# Patient Record
Sex: Female | Born: 1955 | Race: White | Hispanic: No | Marital: Single | Smoking: Former smoker
Health system: Southern US, Community
[De-identification: ages and names within clinical notes are randomized; demographics above are authoritative.]

## PROBLEM LIST (undated history)

## (undated) DIAGNOSIS — G4733 Obstructive sleep apnea (adult) (pediatric): Secondary | ICD-10-CM

## (undated) DIAGNOSIS — F329 Major depressive disorder, single episode, unspecified: Secondary | ICD-10-CM

## (undated) DIAGNOSIS — K635 Polyp of colon: Secondary | ICD-10-CM

## (undated) DIAGNOSIS — I495 Sick sinus syndrome: Secondary | ICD-10-CM

## (undated) DIAGNOSIS — Z45018 Encounter for adjustment and management of other part of cardiac pacemaker: Secondary | ICD-10-CM

## (undated) DIAGNOSIS — F419 Anxiety disorder, unspecified: Secondary | ICD-10-CM

## (undated) DIAGNOSIS — F32A Depression, unspecified: Secondary | ICD-10-CM

## (undated) DIAGNOSIS — K602 Anal fissure, unspecified: Secondary | ICD-10-CM

## (undated) DIAGNOSIS — I10 Essential (primary) hypertension: Secondary | ICD-10-CM

## (undated) DIAGNOSIS — T7840XA Allergy, unspecified, initial encounter: Secondary | ICD-10-CM

## (undated) DIAGNOSIS — K760 Fatty (change of) liver, not elsewhere classified: Secondary | ICD-10-CM

## (undated) DIAGNOSIS — E669 Obesity, unspecified: Secondary | ICD-10-CM

## (undated) DIAGNOSIS — E785 Hyperlipidemia, unspecified: Secondary | ICD-10-CM

## (undated) DIAGNOSIS — K5790 Diverticulosis of intestine, part unspecified, without perforation or abscess without bleeding: Secondary | ICD-10-CM

## (undated) DIAGNOSIS — M1990 Unspecified osteoarthritis, unspecified site: Secondary | ICD-10-CM

## (undated) DIAGNOSIS — H269 Unspecified cataract: Secondary | ICD-10-CM

## (undated) DIAGNOSIS — K219 Gastro-esophageal reflux disease without esophagitis: Secondary | ICD-10-CM

## (undated) HISTORY — PX: PACEMAKER PLACEMENT: SHX43

## (undated) HISTORY — DX: Allergy, unspecified, initial encounter: T78.40XA

## (undated) HISTORY — DX: Obesity, unspecified: E66.9

## (undated) HISTORY — DX: Diverticulosis of intestine, part unspecified, without perforation or abscess without bleeding: K57.90

## (undated) HISTORY — DX: Depression, unspecified: F32.A

## (undated) HISTORY — DX: Unspecified cataract: H26.9

## (undated) HISTORY — DX: Fatty (change of) liver, not elsewhere classified: K76.0

## (undated) HISTORY — DX: Obstructive sleep apnea (adult) (pediatric): G47.33

## (undated) HISTORY — DX: Anxiety disorder, unspecified: F41.9

## (undated) HISTORY — DX: Hyperlipidemia, unspecified: E78.5

## (undated) HISTORY — DX: Unspecified osteoarthritis, unspecified site: M19.90

## (undated) HISTORY — DX: Sick sinus syndrome: I49.5

## (undated) HISTORY — PX: MULTIPLE TOOTH EXTRACTIONS: SHX2053

## (undated) HISTORY — DX: Major depressive disorder, single episode, unspecified: F32.9

## (undated) HISTORY — PX: OOPHORECTOMY: SHX86

## (undated) HISTORY — PX: CATARACT EXTRACTION: SUR2

## (undated) HISTORY — DX: Anal fissure, unspecified: K60.2

## (undated) HISTORY — PX: OTHER SURGICAL HISTORY: SHX169

## (undated) HISTORY — DX: Encounter for adjustment and management of other part of cardiac pacemaker: Z45.018

## (undated) HISTORY — DX: Polyp of colon: K63.5

## (undated) HISTORY — DX: Gastro-esophageal reflux disease without esophagitis: K21.9

## (undated) HISTORY — PX: TONSILLECTOMY: SUR1361

## (undated) HISTORY — DX: Essential (primary) hypertension: I10

---

## 1998-10-11 HISTORY — PX: ABDOMINAL HYSTERECTOMY: SHX81

## 2008-04-02 ENCOUNTER — Ambulatory Visit: Payer: Self-pay

## 2008-04-02 ENCOUNTER — Ambulatory Visit: Payer: Self-pay | Admitting: Internal Medicine

## 2008-04-04 ENCOUNTER — Ambulatory Visit: Payer: Self-pay

## 2008-04-04 ENCOUNTER — Encounter: Payer: Self-pay | Admitting: Internal Medicine

## 2008-05-07 ENCOUNTER — Ambulatory Visit: Payer: Self-pay | Admitting: Internal Medicine

## 2008-06-19 ENCOUNTER — Ambulatory Visit: Payer: Self-pay | Admitting: Internal Medicine

## 2008-08-30 ENCOUNTER — Ambulatory Visit: Payer: Self-pay | Admitting: Internal Medicine

## 2008-09-04 ENCOUNTER — Ambulatory Visit: Payer: Self-pay | Admitting: Internal Medicine

## 2008-09-04 LAB — CONVERTED CEMR LAB
Basophils Absolute: 0.2 10*3/uL — ABNORMAL HIGH (ref 0.0–0.1)
Basophils Relative: 4 % — ABNORMAL HIGH (ref 0.0–3.0)
Calcium: 9.7 mg/dL (ref 8.4–10.5)
Chloride: 106 meq/L (ref 96–112)
Creatinine, Ser: 0.5 mg/dL (ref 0.4–1.2)
Eosinophils Absolute: 0.1 10*3/uL (ref 0.0–0.7)
GFR calc non Af Amer: 137 mL/min
MCHC: 34.8 g/dL (ref 30.0–36.0)
MCV: 87.7 fL (ref 78.0–100.0)
Neutro Abs: 2.6 10*3/uL (ref 1.4–7.7)
Neutrophils Relative %: 53.7 % (ref 43.0–77.0)
RBC: 4.44 M/uL (ref 3.87–5.11)
RDW: 11.8 % (ref 11.5–14.6)
Sodium: 141 meq/L (ref 135–145)
aPTT: 27.1 s (ref 21.7–29.8)

## 2008-09-10 ENCOUNTER — Ambulatory Visit: Payer: Self-pay | Admitting: Internal Medicine

## 2008-09-10 ENCOUNTER — Ambulatory Visit (HOSPITAL_COMMUNITY): Admission: AD | Admit: 2008-09-10 | Discharge: 2008-09-11 | Payer: Self-pay | Admitting: Internal Medicine

## 2008-09-26 ENCOUNTER — Ambulatory Visit: Payer: Self-pay

## 2008-10-11 DIAGNOSIS — H269 Unspecified cataract: Secondary | ICD-10-CM

## 2008-10-11 HISTORY — DX: Unspecified cataract: H26.9

## 2008-10-11 HISTORY — PX: EYE SURGERY: SHX253

## 2008-10-25 ENCOUNTER — Emergency Department (HOSPITAL_COMMUNITY): Admission: EM | Admit: 2008-10-25 | Discharge: 2008-10-26 | Payer: Self-pay | Admitting: Emergency Medicine

## 2008-11-25 ENCOUNTER — Ambulatory Visit: Payer: Self-pay | Admitting: Internal Medicine

## 2009-04-29 ENCOUNTER — Encounter: Payer: Self-pay | Admitting: Internal Medicine

## 2009-09-16 ENCOUNTER — Ambulatory Visit: Payer: Self-pay | Admitting: Internal Medicine

## 2009-09-16 DIAGNOSIS — I5032 Chronic diastolic (congestive) heart failure: Secondary | ICD-10-CM | POA: Insufficient documentation

## 2009-09-16 DIAGNOSIS — I10 Essential (primary) hypertension: Secondary | ICD-10-CM | POA: Insufficient documentation

## 2009-09-16 DIAGNOSIS — R0602 Shortness of breath: Secondary | ICD-10-CM | POA: Insufficient documentation

## 2009-12-17 ENCOUNTER — Ambulatory Visit: Payer: Self-pay | Admitting: Internal Medicine

## 2009-12-29 ENCOUNTER — Encounter: Payer: Self-pay | Admitting: Internal Medicine

## 2010-02-09 ENCOUNTER — Telehealth (INDEPENDENT_AMBULATORY_CARE_PROVIDER_SITE_OTHER): Payer: Self-pay | Admitting: *Deleted

## 2010-03-25 ENCOUNTER — Ambulatory Visit: Payer: Self-pay | Admitting: Internal Medicine

## 2010-04-23 ENCOUNTER — Encounter: Payer: Self-pay | Admitting: Internal Medicine

## 2010-06-25 ENCOUNTER — Ambulatory Visit: Payer: Self-pay | Admitting: Internal Medicine

## 2010-07-22 ENCOUNTER — Encounter: Payer: Self-pay | Admitting: Internal Medicine

## 2010-08-28 ENCOUNTER — Telehealth: Payer: Self-pay | Admitting: Internal Medicine

## 2010-10-15 ENCOUNTER — Encounter: Payer: Self-pay | Admitting: Internal Medicine

## 2010-10-15 ENCOUNTER — Ambulatory Visit
Admission: RE | Admit: 2010-10-15 | Discharge: 2010-10-15 | Payer: Self-pay | Source: Home / Self Care | Attending: Internal Medicine | Admitting: Internal Medicine

## 2010-11-10 NOTE — Progress Notes (Signed)
Summary: re pt's appt  Phone Note Call from Patient   Caller: Patient [phone] Reason for Call: Talk to Nurse Summary of Call: pt called to set up pacer check for december, dr Graciela Husbands is now booked until march, I specifically mailed reminders out early knowing his schedule was far out, pt states she got the reminder two weeks ago, I pulled the reminder list and showed they were sent out 9-15, so do you want this pt to be double booked or can she wait until march? Initial call taken by: Glynda Jaeger,  August 28, 2010 3:02 PM  Follow-up for Phone Call        how can this be,  lets talk about this  Follow-up by: Nathen May, MD, [facility],  August 30, 2010 9:59 AM

## 2010-11-10 NOTE — Letter (Signed)
Summary: Remote Device Check  [facility], Main Office  [address]   [place], [place] [address]   Phone: [phone]  Fax: [phone]     July 22, 2010 MRN: [id number]   [name] [name] [address] [place], [place]  [address]   [name],   Your remote transmission was recieved and reviewed by your physician.  All diagnostics were within normal limits for you.  ___X___Your next office visit is scheduled for:   December 2011 with Dr Graciela Husbands. Please call our office to schedule an appointment.    Sincerely,  Vella Kohler

## 2010-11-10 NOTE — Cardiovascular Report (Signed)
Summary: Office Visit Remote   Office Visit Remote   Imported By: Roderic Ovens 07/23/2010 11:01:56  _____________________________________________________________________  External Attachment:    Type:   Image     Comment:   External Document

## 2010-11-10 NOTE — Cardiovascular Report (Signed)
Summary: Office Visit Remote   Office Visit Remote   Imported By: Roderic Ovens 04/27/2010 12:28:44  _____________________________________________________________________  External Attachment:    Type:   Image     Comment:   External Document

## 2010-11-10 NOTE — Cardiovascular Report (Signed)
Summary: Office Visit Remote   Office Visit Remote   Imported By: Roderic Ovens 12/31/2009 13:54:02  _____________________________________________________________________  External Attachment:    Type:   Image     Comment:   External Document

## 2010-11-10 NOTE — Progress Notes (Signed)
  Faxed all Cardiac over to urgent Medical Care to [phone] [facility]  Feb 09, 2010 8:55 AM

## 2010-11-10 NOTE — Letter (Signed)
Summary: Remote Device Check  [facility], Main Office  [address]   [place], [place] [address]   Phone: [phone]  Fax: [phone]     December 29, 2009 MRN: [id number]   [name] [name] [address] [place], [place]  [address]   Dear [name],   Your remote transmission was recieved and reviewed by your physician.  All diagnostics were within normal limits for you.  __X___Your next transmission is scheduled for:   March 25, 2010.  Please transmit at any time this day.  If you have a wireless device your transmission will be sent automatically.     Sincerely,  Proofreader

## 2010-11-10 NOTE — Letter (Signed)
Summary: Remote Device Check  [facility], Main Office  [address]   [place], [place] [address]   Phone: [phone]  Fax: [phone]     April 23, 2010 MRN: [id number]   [name] [name] [address] [place], [place]  [address]   Dear [name],   Your remote transmission was recieved and reviewed by your physician.  All diagnostics were within normal limits for you.  __X___Your next transmission is scheduled for:   06-25-2010.  Please transmit at any time this day.  If you have a wireless device your transmission will be sent automatically.   Sincerely,  Vella Kohler

## 2010-11-12 NOTE — Assessment & Plan Note (Signed)
Summary: ppm-medtronic-rov   Visit Type:  Follow-up Primary Provider:  [place]   CC:  n o complaints.  History of Present Illness:    [name] [name] is seen in followup for recurrent presyncope associated with documented sinus node dysfunction. She is now status post pacemaker implantation.She has had no recurrent spell since pacer implant.   She does have mild dypsnea on exertion and occasional edema;  She does a good job at minimizing dietary salt intake.  she is doing preetyt well overall with CPAP  Current Medications (verified): 1)  Lotensin 10 Mg Tabs (Benazepril Hcl) .... Take One Tablet Once Daily 2)  Xanax 0.25 Mg Tabs (Alprazolam) .... Take Oen Tablet As Needed 3)  Vicodin 5-500 Mg Tabs (Hydrocodone-Acetaminophen) .... As Needed 4)  Proair Hfa 108 (90 Base) Mcg/act Aers (Albuterol Sulfate) .... As Needed 5)  Celexa 20 Mg Tabs (Citalopram Hydrobromide) .... Take One Tablet Once Daily 6)  Nifedical Xl 60 Mg Xr24h-Tab (Nifedipine) .... Take One Tablet Once Daily 7)  Prevacid Solutab 30 Mg Tbdp (Lansoprazole) .... Daily 8)  Fish Oil 1000 Mg Caps (Omega-3 Fatty Acids) .... Take 8 Capsules Daily 9)  Calcium 500 Mg Tabs (Calcium Carbonate) .... Once Daily 10)  Multivitamins  Tabs (Multiple Vitamin) .... Once Daily 11)  Vitamin D 1000 Unit Tabs (Cholecalciferol) .... Take One Capsule Daily 12)  Zyrtec Allergy 10 Mg Tabs (Cetirizine Hcl) .Marland Kitchen.. 1 Tab Daily 13)  Voltaren-Xr 75mg  Xr24h-Tab (Diclofenac Sodium) .... Two Times A Day 14)  Qvar 80 Mcg/act Aers (Beclomethasone Dipropionate) .... As Directed 15)  Metformin Hcl 500 Mg Tabs (Metformin Hcl) .Marland Kitchen.. 1 Tab Daily 16)  Tricor 145 Mg Tabs (Fenofibrate) .... 1/2 Tab Daily 17)  Complete Allergy 25 Mg Caps (Diphenhydramine Hcl) .... Daily  Allergies (verified): 1)  ! Pcn  Past History:  Past Medical History: Last updated: 09/16/2009 obstructive sleep apnea Obesity Hypertension Presyncope Status post pacemaker  Vital  Signs:  Patient profile:   55 year old female Height:      62 inches Weight:      230 pounds BMI:     42.22 Pulse rate:   85 / minute BP sitting:   153 / 91  (left arm) Cuff size:   large  Vitals Entered By: Burnett Kanaris, CNA (October 15, 2010 4:25 PM)  Physical Exam  General:  The patient was alert and oriented in no acute distress. HEENT Normal.  Neck veins were flat, carotids were brisk.  Lungs were clear.  Heart sounds were regular without murmurs or gallops.  Abdomen was soft with active bowel sounds. There is no clubbing cyanosis or edema. Skin Warm and dry    PPM Specifications Following MD:  Sherryl Manges, MD     PPM Vendor:  Medtronic     PPM Model Number:  [id number]     PPM Serial Number:  [id number] PPM DOI:  09/10/2008     PPM Implanting MD:  Sherryl Manges, MD  Lead 1    Location: RA     DOI: 09/10/2008     Model #: [id number]     Serial #: [id number]     Status: active Lead 2    Location: RV     DOI: 09/10/2008     Model #: [id number]     Serial #: [id number]     Status: active  Magnet Response Rate:  BOL 85 ERI  65  Indications:  Syncope with sinus node dysfunction   PPM Follow Up Battery Voltage:  2.79 V     Battery Est. Longevity:  14.5 YRS     Pacer Dependent:  No       PPM Device Measurements Atrium  Amplitude: 2.80 mV, Impedance: 396 ohms, Threshold: 0.50 V at 0.40 msec Right Ventricle  Amplitude: 15.68 mV, Impedance: 513 ohms, Threshold: 0.50 V at 0.40 msec  Episodes MS Episodes:  0     Coumadin:  No Ventricular High Rate:  2     Atrial Pacing:  0.2%     Ventricular Pacing:  0.6%  Parameters Mode:  MVP     Lower Rate Limit:  40     Upper Rate Limit:  130 Paced AV Delay:  150     Sensed AV Delay:  120 Next Remote Date:  01/14/2011     Next Cardiology Appt Due:  10/12/2011 Tech Comments:  2 VHR EPISODES--LONGEST WAS 4 SECONDS.  NORMAL DEVICE FUNCTION.  CHANGED RA OUTPUT FROM 1.5 TO 2.00 AND RV OUTPUT FROM 2.00 TO 2.50 V.  CARELINK 01-14-11 AND ROV IN 12 MTHS W/SK.  Vella Kohler  Impression & Recommendations:  Problem # 1:  DIASTOLIC HEART FAILURE, CHRONIC (ICD-428.32) stabel The following medications were removed from the medication list:    Lasix 20 Mg Tabs (Furosemide) .Marland Kitchen... Take one tablet 3 days per week Her updated medication list for this problem includes:    Lotensin 10 Mg Tabs (Benazepril hcl) .Marland Kitchen... Take one tablet once daily    Nifedical Xl 60 Mg Xr24h-tab (Nifedipine) .Marland Kitchen... 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  will folllwo BP at home and report to her PCP The following medications were removed from the medication list:    Lasix 20 Mg Tabs (Furosemide) .Marland Kitchen... Take one tablet 3 days per week Her updated medication list for this problem includes:    Lotensin 10 Mg Tabs (Benazepril hcl) .Marland Kitchen... Take one tablet once daily    Nifedical Xl 60 Mg Xr24h-tab (Nifedipine) .Marland Kitchen... Take one tablet once daily  Problem # 4:  SINUS NODE DYSFUNCTION (ICD-427.81) <1% apacing but no symptoms Her updated medication list for this problem includes:    Lotensin 10 Mg Tabs (Benazepril hcl) .Marland Kitchen... Take one tablet once daily    Nifedical Xl 60 Mg Xr24h-tab (Nifedipine) .Marland Kitchen... Take one tablet once daily  Patient Instructions: 1)  Your physician recommends that you continue on your current medications as directed. Please refer to the Current Medication list given to you today. 2)  Your physician wants you to follow-up in:  1 year You will receive a reminder letter in the mail two months in advance. If you don't receive a letter, please call our office to schedule the follow-up appointment.

## 2011-01-14 ENCOUNTER — Ambulatory Visit (INDEPENDENT_AMBULATORY_CARE_PROVIDER_SITE_OTHER): Payer: Self-pay | Admitting: *Deleted

## 2011-01-14 DIAGNOSIS — R0989 Other specified symptoms and signs involving the circulatory and respiratory systems: Secondary | ICD-10-CM

## 2011-01-14 DIAGNOSIS — I495 Sick sinus syndrome: Secondary | ICD-10-CM

## 2011-01-14 DIAGNOSIS — Z950 Presence of cardiac pacemaker: Secondary | ICD-10-CM

## 2011-01-15 ENCOUNTER — Other Ambulatory Visit: Payer: Self-pay

## 2011-01-18 NOTE — Progress Notes (Signed)
Pacer remote check  

## 2011-01-25 LAB — POCT I-STAT, CHEM 8
Calcium, Ion: 1.15 mmol/L (ref 1.12–1.32)
HCT: 42 % (ref 36.0–46.0)
TCO2: 26 mmol/L (ref 0–100)

## 2011-01-25 LAB — POCT CARDIAC MARKERS: Troponin i, poc: <0.05 ng/mL (ref 0.00–0.09)

## 2011-01-31 ENCOUNTER — Encounter: Payer: Self-pay | Admitting: *Deleted

## 2011-02-23 NOTE — Op Note (Signed)
NAME:  [name] [name], [name] [name]               ACCOUNT NO.:  [id number]   MEDICAL RECORD NO.:  [id number]          PATIENT TYPE:  INP   LOCATION:  3735                         FACILITY:  [facility]   PHYSICIAN:  Duke Salvia, MD, FACCDATE OF BIRTH:  [birth year]   DATE OF PROCEDURE:  09/10/2008  DATE OF DISCHARGE:                               OPERATIVE REPORT   PREOPERATIVE DIAGNOSIS:  Syncope and sinus node dysfunction.   POSTOPERATIVE DIAGNOSIS:  Syncope and sinus node dysfunction.   PROCEDURE:  Dual-chamber pacemaker implantation.   DESCRIPTION OF PROCEDURE:  Following obtaining informed consent, the  patient was brought to the Electrophysiology Laboratory and placed on  the fluoroscopic table in supine position.  After routine prep and drape  of the left upper chest, lidocaine was infiltrated in prepectoral  subclavicular region.  An incision was made and carried down to the  layer of the prepectoral fascia using electrocautery and sharp  dissection.  A pocket was formed similarly and hemostasis was obtained.   Thereafter, attention was turned gaining access to the extrathoracic  left subclavian vein which was accomplished without difficulty and  without the aspiration of air or puncture of the artery.  Two separate  venipunctures were accomplished.  Guidewires were placed and retained  and a 0 silk suture was placed in a figure-of-eight fashion allowed to  hang loosely.  Sequentially, a 7-French sheath was placed through which  were passed a Medtronic 5076 52-cm active fixation ventricular lead,  serial number [id number] and a Medtronic 5076 45-cm active fixation  atrial lead, serial number [id number].  Under fluoroscopic guidance,  they were manipulated to the right ventricular apex and the right atrial  appendage respectively where the bipolar R waves were 11.1 with a pace  impedance of 964, threshold of 0.7 volts at 0.5 milliseconds.  Current  threshold was 1.1 mA and there was no  diaphragmatic pacing at 10 volts  and  the current of injury was brisk.   The bipolar P-wave was 3.7 with a pace impedance of 953 ohms, a  threshold of 0.8 volts at 0.5 milliseconds.  Current threshold was 1.0  mA.  There was no diaphragmatic pacing at 10 volts and the current of  injury was brisk.  The leads were secured to the prepectoral fascia.  The ventricular lead was marked with a tie although the hemostatic  suture was secured.  The lead was then attached to Medtronic Adapta  ADDRL1 pulse generator, serial number [id number].  The pocket was  copiously irrigated with antibiotic containing saline solution.  Hemostasis was assured.  The leads and the pulse generator were placed  and the pocket secured to the prepectoral fascia.  The  wound was closed in three layers in a normal fashion.  The wound was  washed, dried and a benzoin and Steri-Strip dressing was applied.  Needle counts, sponge counts and instrument counts were correct at the  end of the procedure according to the staff.  The patient tolerated the  procedure without apparent complication.      Duke Salvia, MD, [facility]  Electronically Signed  SCK/MEDQ  D:  09/10/2008  T:  09/10/2008  Job:  [id number]   cc:   Clydie Braun L. Hal Hope, M.D.

## 2011-02-23 NOTE — Letter (Signed)
April 03, 2008    [name] [name] L. Hal Hope, M.D.  [facility]  [address]  [place], [place] [address]   RE:  [name], [name]  MRN:  [id number]  /  DOB:  Feb 22, 1956   [name],   I had the pleasure of seeing [name] [name] today at your request.  Her  major issue has been passing out.  The most vivid example occurred while  she was on a boat.  She started laughing very hard and then lost  consciousness and fell back into seat of the boat.  She was described as  being a little bit pale.  She was quite limp.  There was no associated  motor activity.  There was no significant recovery fatigue.   This was a more severe episode of something that she had recognized  previously occurring with laughter.  She does not cough (and does not  smoke).  She also notes similar symptoms when she lifts something heavy.   She also has a history of exercise intolerance which has been getting  worse manifested by shortness of breath.  She has some peripheral edema.  There is occasional lightheadedness that occur with the exertional  dyspnea.   She has put on about 25 pounds in the last 12 months.   She underwent a catheterization at [facility] 2 years ago for  chest pain that was negative.  Her other medical issues are notable for:  1. Hypertension.  2. Peripheral edema.   Past medical history in addition to the above is notable for:  1. GE reflux disease.  2. Arthritis.  3. Anxiety/depression.  4. Migraine headaches.  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  6. Fatigue.  7. Cold-induced asthma.  8. Allergies.   Past surgical history is notable for tonsillectomy, oophorectomy, and  right knee surgery.   SOCIAL HISTORY:  She is single.  She has no children.  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  She does not use cigarettes, alcohol, or  recreational drugs.  She does exercise.   MEDICATIONS:  As you know her medications include:  1. Estradiol.  2. Zolpidem 10.  3. Motrin  800 t.i.d.  4. Metoprolol 100 b.i.d.  5. Zetia 10.  6. Nifedipine 60.   ALLERGIES:  PENICILLIN.   PHYSICAL EXAMINATION:  VITAL SIGNS:  Her blood pressure was 141/84 with  a pulse of 82 without any orthostatic change at 2 minutes of standing,  her weight was 229, and she is 5 feet 2.  HEENT:  Demonstrated no icterus, no xanthoma.  NECK:  The neck veins were flat.  The carotids were brisk and full  bilaterally without bruits.  BACK:  Without kyphosis or scoliosis.  LUNGS:  Clear.  HEART:  Sounds were regular without murmurs or gallops.  ABDOMEN:  Soft with active bowel sounds without midline pulsation or  hepatomegaly.  EXTREMITIES:  Femoral pulses were 2+, distal pulses were intact.  There  is no clubbing, cyanosis, or edema.  There was 2+ peripheral edema.  NEUROLOGICAL:  Grossly normal.  SKIN:  Warm and dry.   Electrocardiogram, unfortunately I misplaced, but was normal.   IMPRESSION:  1. Syncope - last year - Valsalva.  2. Exercise intolerance likely diastolic heart failure.  3. Obesity, which is worsening.  4. Obstructive sleep apnea on continuous positive airway pressure.  5. Hypertension.  6. Penicillin allergy.   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  It is similar to  cough  syncope in its mechanism and that increasing intrathoracic pressure  results in decreased venous return and then either decreased cardiac  output directly or vasomotor mediated dilatation.  The consequences  could be striking depending on where it is that the laughter occurs.  I  wonder whether it is aggravated by her progressive obesity.  I have  encouraged her in weight loss, may be her treatment of her sleep apnea  will ameliorate that.   I have also suggested we get an echo to make sure there is nothing else  going on in terms of the exercise intolerance.  I suspect that this must be diastolic heart failure and the remediation  of that is most easily accomplished with decreasing salt  and her  diuretic.  In this regard, I had felt that may be the nifedipine could  be changed out for an ACE inhibitor with a thiazide diuretic.  We will  wait and see what the echo shows and then we will plan to talk with you  again about that.   Thanks very much for allowing [place] to see her.    Sincerely,      Duke Salvia, MD, [facility]  Electronically Signed    SCK/MedQ  DD: 04/03/2008  DT: 04/04/2008  Job #: [id number]

## 2011-02-23 NOTE — Discharge Summary (Signed)
NAME:  [name] [name], [name] [name]               ACCOUNT NO.:  [id number]   MEDICAL RECORD NO.:  [id number]          PATIENT TYPE:  INP   LOCATION:  3735                         FACILITY:  [facility]   PHYSICIAN:  Duke Salvia, MD, FACCDATE OF BIRTH:  07/09/1956   DATE OF ADMISSION:  09/10/2008  DATE OF DISCHARGE:  09/11/2008                               DISCHARGE SUMMARY   ALLERGIES:  This patient has an allergy to PENICILLIN.   FINAL DIAGNOSES:  1. Discharging day 1 status post implant of a Medtronic ADAPTA dual-      chamber pacemaker.  2. Admitted with history of presyncope coupled to sinus node      dysfunction/sinus node arrest.  3. Chest pain post-procedure on September 10, 2008, which was sharp and      transient.      a.     The patient has prior history of fleeting chest pain.      b.     Chest x-ray at 1400 hours on September 10, 2008 shows no       pneumothorax and lungs were clear.      c.     Device interrogation shows all values within normal limits.       The patient has had no followup chest pain since the episode at       1515 hours on September 10, 2008.   SECONDARY DIAGNOSES:  1. Hypertension.  2. Obesity.  3. Hormone replacement therapy.  4. Obstructive sleep apnea/continuous positive airway pressure.   PROCEDURE:  September 10, 2008, implant of the Medtronic ADAPTA dual-  chamber pacemaker, Dr. Sherryl Manges.  The patient has had no post-  procedural complications.  No pneumothorax on the chest x-ray.  No  congestive failure symptoms.  No hematoma at the pacer pocket and no  incisional pain.   BRIEF HISTORY:  Ms. Molloy is a 55 year old female.  She has presyncopal  episodes which correlate with sinus node arrest.  There is some PP  prolongation associated with episodes and is possibly a dysautonomic  process, but there are no clear triggers to this.   The patient has recurrent symptoms of presyncope and it may be  worthwhile proceeding with pacemaker implantation.  This is  probably not  a primary dysautonomic progress.  The risks and benefits of pacemaker  implantation have been described to the patient and she wishes to  proceed.   HOSPITAL COURSE:  The patient presents electively on September 10, 2008.  She underwent implantation of the dual-chamber Medtronic device the same  day.  She had one episode of chest pain which was evaluated and proved  to be not related to microperforation or pericardial effusion by at  least interrogation of the device.  The patient was ready for discharge  on post-procedure day #1.  She is asked to keep her incision dry for the  next 7 days and to sponge bathe until Thursday, September 17, 2008.  She  is asked not to drive for 1 week.  Mobility of the left arm has been  discussed with the patient.   MEDICATIONS  AT DISCHARGE:  1. Estradiol 1 mg daily.  2. Lotensin 10 mg daily.  3. Zolpidem 10 mg daily at bedtime.  4. Nifedipine ER 60 mg daily.  5. Citalopram 20 mg daily.  6. Prilosec 20 mg daily.  7. Vitamin B complex daily.  8. Omega-3 fish oil 2 tablets twice daily.  9. Xanax 0.25 mg every 8 hours as needed.  10.Hydrocodone as needed.   FOLLOWUP:  She follows up with [facility], [address] [place].  1. Pacer Clinic, Thursday, September 26, 2008 at 9 o'clock.  2. She sees Dr. Graciela Husbands on Tuesday, December 10, 2008 at 9:10 in the      morning.   Of note, the patient has a transthoracic echocardiogram dated April 04, 2008 with ejection fraction of 65%.  There was some abnormal left  ventricular relaxation.  Mitral valve shows no significant mitral  regurgitation.  Tricuspid valve has no significant tricuspid  regurgitation.   Laboratory studies pertinent to this admission were drawn on September 04, 2008.  White cells 4.7, hemoglobin 13.5, hematocrit 38.9, and  platelets of 258.  Protime 12.2.  INR is 1.  Sodium 141, potassium 4.1,  chloride 106, carbonate 29, glucose 99, BUN is 13, and creatinine  0.5.      Maple Mirza, [place]      Duke Salvia, MD, [facility]  Electronically Signed    GM/MEDQ  D:  09/11/2008  T:  09/11/2008  Job:  [id number]   cc:   Clydie Braun L. Hal Hope, M.D.

## 2011-02-23 NOTE — Letter (Signed)
August 30, 2008    [name] [name] L. Hal Hope, MD  [address] [place], [place] [address]   RE:  [name] [name], [name] [name]  MRN:  [id number]  /  DOB:  June 12, 1956   Dear Franne Grip Mehringer comes in today again in followup for her presyncopal  episodes that correlate with sinus node arrest.  The initial presumption  was that these were occurring with laughter, however, she has had some  episodes subsequent to this that have been unassociated with that and  the electrograms would correlate with sinus node pausing.  It is not  clear whether it is arrest or exit block.  There is a little bit of PP  prolongation associated with these episodes, raising the possibility  that this is a dysautonomic process, although there are no clear  triggers to this.   CURRENT MEDICATIONS:  1. Estradiol.  2. Zolpidem.  3. Motrin.  4. Zetia 10.  5. Nifedipine 60.  6. Loestrin 10.   PHYSICAL EXAMINATION:  GENERAL:  Her blood pressure is pretty well  controlled, though her new antihypertensive is not on the [facility] and she  did know what it was.  LUNGS:  Clear.  HEART:  Sounds were regular.  ABDOMEN:  Soft.  EXTREMITIES:  Without edema.  NECK:  Veins were flat.   REVIEW OF SYSTEMS:  As above.   IMPRESSION:  1. Recurrent sinus node dysfunction with presyncope.  2. Hypertension.  3. Obesity with an ongoing success in weight loss, now down another 7      pounds in the last 2 months.  4. [name] [name], [name] [name] has recurrent symptoms of presyncope and I wonder at      this point it was not worthwhile proceeding with pacemaker      implantation.  I do not think that this represents a primary      dysautonomic process, although I cannot be sure.  The importance of      this is that concomitant vasodilatation might leave her with      persistent symptoms even while we interrupt her bradycardia.   I have reviewed the above with her the potential benefits as well as  potential procedure risks and the potential for residual  symptoms  afterwards she would like to proceed.   Thanks very much for asking [place] to see her.    Sincerely,      Duke Salvia, MD, [facility]  Electronically Signed    SCK/MedQ  DD: 08/30/2008  DT: 08/31/2008  Job #: [id number]

## 2011-02-23 NOTE — Letter (Signed)
May 07, 2008    Clydie Braun L. Hal Hope, M.D.  [address]  [place], [place] [address]   RE:  [name], [name]  MRN:  [id number]  /  DOB:  1956/08/06   Dear Dr. Hal Hope:   [name] [name] is seen after initial evaluation for syncope occurring in the  context of laughter.  It was my impression based on the history that  this was laughter syncope.  She continued to have spells, however, we  undertook an event recorder, which demonstrated significant episodes of  sinus slowing that were not what I would have anticipated to be  consistent with autonomic effects.  Specifically, what we saw were  recurrent pauses interspersed with recurrent RR intervals spaced over  number of seconds.  Typically, I had seen autonomic effects being more  gradual in onset and offset.   MEDICATIONS:  Notable for metoprolol 100 b.i.d., nifedipine 60, which  she takes for blood pressure.   PHYSICAL EXAMINATION:  On examination today, her blood pressure was  156/97 with a pulse of 71, weight was 225.  Her lungs were clear.  Her  heart sounds were regular.  Her abdomen was soft.  The extremities were  without edema.   IMPRESSION:  1. 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  2. Hypertension treated with beta blockers (high dose) and nifedipine.  3. Obesity.  4. Exercise intolerance  5. Normal left ventricular function.   I have elected to decrease her metoprolol from 100 mg twice daily to  once a day.  I would anticipate that this will have no effect on her  episodes because I think the intermittent nature of them suggest that  the issue is primarily electrical, and I do not think it is being  modulated significantly by beta-blocker, still however I think it is  prudent to do this.  She also describes surges in blood pressure, which  may well be adrenaline-mediated and so maintaining some degree of beta  blockade would probably be appropriate  conjoint therapy.  I also would  favor trying to use alternative therapies to beta blocker if her blood  pressure as the recent data are not all that encouraging about the  overall benefits of beta blocker as primary therapy for blood pressure  management.  We will see her again in 5-6 weeks' time to see how these  spells are doing.    Sincerely,      Duke Salvia, MD, [facility]  Electronically Signed    SCK/MedQ  DD: 05/07/2008  DT: 05/08/2008  Job #: [id number]

## 2011-02-23 NOTE — Letter (Signed)
June 19, 2008    [name] [name] L. Hal Hope, MD  [facility]  [address]  [place], [place] [address]   RE:  [name] [name], [name] [name]  MRN:  [id number]  /  DOB:  10-15-1955   [name] comes in today doing better without recurrent spells of  lightheadedness.  She has lost 8 pounds in the last couple of months.  It may be perhaps because of that her blood pressures are doing better  at home.  They are 120/90s or so.   Her medications currently include the metoprolol now down titrated to  100 and nifedipine is 60.  She is also on Zetia.   PHYSICAL EXAMINATION:  VITAL SIGNS:  Blood pressure today was 130/85,  her pulse was 71, her weight noted was 221.  LUNGS:  Clear.  HEART:  Sounds were regular.  EXTREMITIES:  Without edema.   IMPRESSION:  1. Sinus node dysfunction with presyncope.  2. Hypertension.  3. Obesity with intercurrent weight loss.   [name] [name], [name] [name] is doing surprisingly well with her lower dose  metoprolol, may be it is because of her weight.  I suggest that we try  and get her off of the beta-blocker and may be on to an ACE  inhibitor.  I am going to decrease her metoprolol from 100 to 50 mg a  day today.  She is to see you in couple of weeks and at that point  further weaning and getting her on something else would be great.  She  has to make sure we have followup because I am altogether sanguine that  we will end up without further spells.  That likelihood is probably  about 25%.    Sincerely,      Duke Salvia, MD, [facility]  Electronically Signed    SCK/MedQ  DD: 06/19/2008  DT: 06/19/2008  Job #: [id number]

## 2011-02-23 NOTE — Assessment & Plan Note (Signed)
 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  She has had no  recurrent symptoms since implantation, September 10, 2008.   PHYSICAL EXAMINATION:  GENERAL:  She otherwise is doing well without  complaints of chest pain or shortness of breath.  VITAL SIGNS:  Her weight is stable.  Her blood pressure is 134/88, which  is stable.  Her pulse was 90.  LUNGS:  Clear.  CARDIOVASCULAR:  Heart sounds were regular.  Device pocket was well  healed.  EXTREMITIES:  Without edema.   Interrogation of her Medtronic Adapta pulse generator demonstrates a P-  wave of 1 with impedance of 430, and threshold 0.375 at 0.4.  The R  waves of 11 with impedance of 508 and a threshold of 0.5 at 0.4.  Battery voltage was 2.79.  She is ventricular paced and atrial paced  only of 0.6/0.2% respectively.  The device was reprogrammed.   IMPRESSION:  1. Recurrent syncope questions laugh questions primary sinus node      dysfunction.  2. Status post pacer for the above.   [name] [name] is doing very well.  We will plan to see her again in 9 months'  time.  She is asked to notify us if there is any questions in the  interim.     Duke Salvia, MD, [facility]  Electronically Signed    SCK/MedQ  DD: 11/25/2008  DT: 11/26/2008  Job #: [id number]   cc:   Clydie Braun L. Hal Hope, M.D.  [facility]

## 2011-02-23 NOTE — Letter (Signed)
April 03, 2008    Clydie Braun L. Hal Hope, M.D.  [address]  [place] [address]   RE:  ISHANI, GOLDWASSER  MRN:  [id number]  /  DOB:  05/01/1956   ADDENDUM   Dear Clydie Braun,   Unfortunately the dictation got cut off.   MEDICATIONS:  As you know her medications include:  1. Estradiol.  2. Zolpidem 10.  3. Motrin 800 t.i.d.  4. Metoprolol 100 b.i.d.  5. Zetia 10.  6. Nifedipine 60.   ALLERGIES:  PENICILLIN.   PHYSICAL EXAMINATION:  VITAL SIGNS:  Her blood pressure was 141/84 with  a pulse of 82 without any orthostatic change at 2 minutes of standing,  her weight was 229, and she is 5 feet 2.  HEENT:  Demonstrated no icterus, no xanthoma.  NECK:  The neck veins were flat.  The carotids were brisk and full  bilaterally without bruits.  BACK:  Without kyphosis or scoliosis.  LUNGS:  Clear.  HEART:  Sounds were regular without murmurs or gallops.  ABDOMEN:  Soft with active bowel sounds without midline pulsation or  hepatomegaly.  EXTREMITIES:  Femoral pulses were 2+, distal pulses were intact.  There  is no clubbing, cyanosis, or edema.  There was 2+ peripheral edema.  NEUROLOGICAL:  Grossly normal.  SKIN:  Warm and dry.   Electrocardiogram, unfortunately I misplaced, but was normal.   IMPRESSION:  1. Syncope - last year - Valsalva.  2. Exercise intolerance likely diastolic heart failure.  3. Obesity, which is worsening.  4. Obstructive sleep apnea on continuous positive airway pressure.  5. Hypertension.  6. Penicillin allergy.   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  It is similar to cough  syncope in its mechanism and that increasing intrathoracic pressure  results in decreased venous return and then either decreased cardiac  output directly or vasomotor mediated dilatation.  The consequences  could be striking depending on where it is that the laughter occurs.  I  wonder whether it is aggravated by her progressive obesity.  I have  encouraged her in weight  loss, may be her treatment of her sleep apnea  will ameliorate that.   I have also suggested we get an echo to make sure there is nothing else  going on in terms of the exercise intolerance.  I suspect that this must be diastolic heart failure and the remediation  of that is most easily accomplished with decreasing salt and her  diuretic.  In this regard, I had felt that may be the nifedipine could  be changed out for an ACE inhibitor with a thiazide diuretic.  We will  wait and see what the echo shows and then we will plan to talk with you  again about that.   Thanks very much for allowing [place] to see her.    Sincerely,       Duke Salvia, MD, [facility]      ______________________________  Marcos Eke. Hal Hope, M.D.   Jackquline Bosch  DD: 04/03/2008  DT: 04/04/2008  Job #: [id number]

## 2011-04-11 DIAGNOSIS — K760 Fatty (change of) liver, not elsewhere classified: Secondary | ICD-10-CM

## 2011-04-11 HISTORY — DX: Fatty (change of) liver, not elsewhere classified: K76.0

## 2011-04-15 ENCOUNTER — Other Ambulatory Visit: Payer: Self-pay | Admitting: Internal Medicine

## 2011-04-15 ENCOUNTER — Ambulatory Visit (INDEPENDENT_AMBULATORY_CARE_PROVIDER_SITE_OTHER): Payer: PRIVATE HEALTH INSURANCE | Admitting: *Deleted

## 2011-04-15 DIAGNOSIS — I495 Sick sinus syndrome: Secondary | ICD-10-CM

## 2011-04-16 LAB — REMOTE PACEMAKER DEVICE
AL IMPEDENCE PM: 381 Ohm
AL IMPEDENCE PM: 381 Ohm
AL THRESHOLD: 0.375 V
ATRIAL PACING PM: 0
BATTERY VOLTAGE: 2.79 V
BATTERY VOLTAGE: 2.79 V
BAVD-0001: 120 ms
BMOD-0005: 95 {beats}/min
BPAC-0003RA: 0.4 ms
BSEN-0004RV: 230 ms
BSEN-0005RV: 28 ms
DEV-0006LDO: 20091201
DEV-0006LDO: 20091201
DEV-0023LDO: 0
DEV-0023PM: 0
DEV-0024PM: 85
EVAL-0001E5: 20120705131100
EVAL-0003E5: NEGATIVE
RV LEAD AMPLITUDE: 8 mv
TCAP-0003RA: 0.4 ms
TEL-0014: 0.133 Ohm
VENTRICULAR PACING PM: 0
VENTRICULAR PACING PM: 0

## 2011-04-21 ENCOUNTER — Encounter: Payer: Self-pay | Admitting: *Deleted

## 2011-04-23 NOTE — Progress Notes (Signed)
Pacer remote check  

## 2011-04-26 ENCOUNTER — Other Ambulatory Visit: Payer: Self-pay | Admitting: Emergency Medicine

## 2011-04-27 ENCOUNTER — Ambulatory Visit
Admission: RE | Admit: 2011-04-27 | Discharge: 2011-04-27 | Disposition: A | Discharge status: Home / Self Care | Payer: PRIVATE HEALTH INSURANCE | Source: Ambulatory Visit | Attending: Emergency Medicine | Admitting: Emergency Medicine

## 2011-05-03 ENCOUNTER — Encounter: Payer: Self-pay | Admitting: Internal Medicine

## 2011-05-25 ENCOUNTER — Encounter: Payer: Self-pay | Admitting: Internal Medicine

## 2011-05-25 ENCOUNTER — Other Ambulatory Visit (INDEPENDENT_AMBULATORY_CARE_PROVIDER_SITE_OTHER): Payer: PRIVATE HEALTH INSURANCE

## 2011-05-25 ENCOUNTER — Ambulatory Visit (INDEPENDENT_AMBULATORY_CARE_PROVIDER_SITE_OTHER): Payer: PRIVATE HEALTH INSURANCE | Admitting: Internal Medicine

## 2011-05-25 DIAGNOSIS — R1011 Right upper quadrant pain: Secondary | ICD-10-CM

## 2011-05-25 DIAGNOSIS — G43909 Migraine, unspecified, not intractable, without status migrainosus: Secondary | ICD-10-CM | POA: Insufficient documentation

## 2011-05-25 DIAGNOSIS — Z86010 Personal history of colonic polyps: Secondary | ICD-10-CM | POA: Insufficient documentation

## 2011-05-25 DIAGNOSIS — E1165 Type 2 diabetes mellitus with hyperglycemia: Secondary | ICD-10-CM | POA: Insufficient documentation

## 2011-05-25 DIAGNOSIS — K760 Fatty (change of) liver, not elsewhere classified: Secondary | ICD-10-CM

## 2011-05-25 DIAGNOSIS — K7689 Other specified diseases of liver: Secondary | ICD-10-CM

## 2011-05-25 DIAGNOSIS — E785 Hyperlipidemia, unspecified: Secondary | ICD-10-CM | POA: Insufficient documentation

## 2011-05-25 DIAGNOSIS — K219 Gastro-esophageal reflux disease without esophagitis: Secondary | ICD-10-CM | POA: Insufficient documentation

## 2011-05-25 DIAGNOSIS — G4733 Obstructive sleep apnea (adult) (pediatric): Secondary | ICD-10-CM | POA: Insufficient documentation

## 2011-05-25 LAB — HEPATIC FUNCTION PANEL
AST: 37 U/L (ref 0–37)
Albumin: 4.3 g/dL (ref 3.5–5.2)
Alkaline Phosphatase: 72 U/L (ref 39–117)
Total Bilirubin: 0.6 mg/dL (ref 0.3–1.2)

## 2011-05-25 LAB — CHOLESTEROL, TOTAL: Cholesterol: 242 mg/dL — ABNORMAL HIGH (ref 0–200)

## 2011-05-25 NOTE — Patient Instructions (Signed)
Go directly to the basement to have your labs drawn today. You've been scheduled for a HIDA Scan at [facility] on Aug. 27th at 10:00am. Please arrive 15 min. before.  Nothing to eat or drink after midnight and no pain medicines six hours before the test.

## 2011-05-25 NOTE — Progress Notes (Signed)
Subjective:    Patient ID: [name] [name], female    DOB: 1956-01-06, 55 y.o.   MRN: [id number]  55 year old female referred for evaluation of fatty liver disease by Dr. Cleta Alberts  HPI [name] [name] is a 55 year old female with a past medical history of hypertension, hyperlipidemia, sleep apnea, asthma, migraine, GERD, and history of adenomatous colon polyps he presents for evaluation of fatty liver disease seen on recent abdominal ultrasound.  The patient reports she was evaluated in urgent care in the middle of July 2012 for a GI illness. During this visit abdominal ultrasound performed and after the findings revealed fatty liver she is referred for evaluation.  Today she reports overall she is feeling well. For the most part her GI illness has resolved she is no longer having significant nausea and vomiting. She does report some back pain primarily on the right in the thoracic spine.  This pain is worse with movement as well as with deep breath. It started after her episodes of violent vomiting. She does report a separate from this pain and intermittent right upper quadrant pain which radiates around her right side. This pain is worse with eating and will on occasion wake her from sleep. She is unaware of any specific foods makes the pain worse. It is sometimes associated with nausea but no vomiting. She does feel overall her appetite has been poor.  She reports some weight loss with a recent GI illness. She denies fevers chills or night sweats.  Regarding her liver she reports no prior history of "trouble my her liver".  She has never known her liver enzymes to be elevated. She does state that they have been monitored in the past while she has been taking various medications and they have always been normal. She denies a history of jaundice. No history of pruritus.  She also has no reported family history of liver disease.  She does note an intolerance to statins and she's tried multiple medications for her  hyperlipidemia. She reports that all the statin medications as well as Zetia and TriCor have caused flulike symptoms.  Therefore at present her hyperlipidemia is not treated.   Review of Systems  Constitutional: Positive for appetite change and fatigue. Negative for fever and chills.       See HPI   HENT: Negative for sore throat, trouble swallowing and neck pain.   Eyes: Negative for pain, redness and visual disturbance.  Respiratory: Negative for cough and shortness of breath.   Cardiovascular: Negative for chest pain and leg swelling.  Gastrointestinal: Negative for diarrhea, constipation and blood in stool.       See HPI  Genitourinary: Negative for dysuria, frequency and difficulty urinating.  Musculoskeletal: Positive for myalgias. Negative for back pain and arthralgias.  Skin: Negative for color change and rash.  Neurological: Negative for dizziness, weakness and numbness.  Hematological: Negative.  Does not bruise/bleed easily.  Psychiatric/Behavioral: Negative for dysphoric mood. The patient is nervous/anxious.    Family History  Problem Relation Age of Onset  . Breast cancer Mother     maternal grandmother  . Diabetes Sister   . Heart disease Mother   . Colon cancer Neg Hx   positive for polyps - in siblings  SH: no etoh, tob, illicits drug use      Objective:   Physical Exam  Constitutional: She is oriented to person, place, and time. She appears well-developed and well-nourished. No distress.  HENT:  Head: Normocephalic and atraumatic.  Mouth/Throat: No oropharyngeal exudate.  Eyes: Conjunctivae and EOM are normal. Pupils are equal, round, and reactive to light. No scleral icterus.  Neck: Normal range of motion. Neck supple. No tracheal deviation present.  Cardiovascular: Normal rate and regular rhythm.   Pulmonary/Chest: Effort normal and breath sounds normal.  Abdominal: Soft. Bowel sounds are normal. She exhibits no distension and no mass. There is no  tenderness.  Neurological: She is alert and oriented to person, place, and time.  Skin: Skin is warm and dry. No rash noted.  Psychiatric: She has a normal mood and affect.    Labs/Imaging: Ultrasound abdomen complete, 04/27/2011: The gallbladder reveals no gallstones, gallbladder wall thickening or pericholecystic fluid. Common bile duct is normal with a maximum diameter 4.0 mm. The liver revealed diffuse increased echogenicity with decreased sound transmission consistent with fatty infiltration. There were no focal lesions.  Normal sonographic appearance of the pancreas spleen and both kidneys.  CBC, 04/26/2011: WBC 6.3, hemoglobin 13.6, hematocrit 42.3%, platelets 273      Assessment & Plan:  This is a 55 year old female with a past medical history of hypertension, hyperlipidemia, sleep apnea, asthma, migraines, GERD, and history of adenomatous colon  polyps, here for evaluation of fatty liver disease found on ultrasound. Of note she is also having intermittent right upper quadrant pain.  1. Fatty liver -  At present her fatty liver is very likely related to her overall metabolic syndrome of hypertension, hyperlipidemia, obesity, and diabetes.  She has no history of alcohol use to complicate the picture.  I would like to check a hepatic function panel today to ensure that she has no elevation in her serum transaminases.  We did discuss how in some people fatty liver disease does lead to liver inflammation and can progress to advanced liver disease and cirrhosis. At this time it does not seem that inflammation is an issue but we will verify this with her lab tests today.  The mainstay of treatment for fatty liver disease will be modification of the risk factors including weight loss and exercise. I will check her cholesterol panel today and if this remains elevated she can discuss other options for therapy with her PCP. I will also check her serum antibodies to hepatitis A and B, and if these are  negative, I recommend she be vaccinated.  2. Right upper quadrant pain - it is certainly possible the right upper quadrant pain is related to biliary dyskinesia.  Her ultrasound revealed a normal gallbladder however we do not know how well her gallbladder functions. I will order a [facility] HIDA scan to better evaluate gallbladder function. I will see her back in the office one to 2 weeks after this result is available to discuss possible options. We have discussed that if her gallbladder is found to be abnormal she would likely be referred to surgery. We did discuss however that some people who have right upper quadrant pain felt to be biliary in nature go for cholecystectomy and did not have pain relief.

## 2011-05-26 LAB — HEPATITIS A ANTIBODY, TOTAL: Hep A Total Ab: NEGATIVE

## 2011-05-27 ENCOUNTER — Telehealth: Payer: Self-pay | Admitting: *Deleted

## 2011-05-27 DIAGNOSIS — R7401 Elevation of levels of liver transaminase levels: Secondary | ICD-10-CM

## 2011-05-27 NOTE — Telephone Encounter (Signed)
Message copied by Florene Glen on Thu May 27, 2011 11:28 AM ------      Message from: Beverley Fiedler      Created: Wed May 26, 2011 12:20 PM       Aram Beecham       Please call patient with results of her labs.      Total cholesterol remains elevated (patient expect this result). She should talk to her PCP about other possible treatments to lower her cholesterol which will also help with fatty liver disease.      Her hepatic panel revealed slight elevation in ALT (liver specific enzyme) which is likely the result of fatty liver.  This is what we would like to improve with diet, weight loss, exercise, and glucose and cholesterol control. We will follow the liver panel over time. Remainder of panel normal.      Finally, no exposure to Hep A or B, she needs to start TwinRx (hep a/b vaccine series).  This can be done as RN visit.      Thanks.

## 2011-05-27 NOTE — Telephone Encounter (Signed)
Spoke with pt and informed her of Dr Lauro Franklin findings. i informed her of the need for the Twin Rx vaccine series and she would like them to be at Dr Clydie Braun Richter's office. I will forward this note to her office; pt stated understanding.

## 2011-06-07 ENCOUNTER — Encounter (HOSPITAL_COMMUNITY)
Admission: RE | Admit: 2011-06-07 | Discharge: 2011-06-07 | Disposition: A | Discharge status: Home / Self Care | Payer: PRIVATE HEALTH INSURANCE | Source: Ambulatory Visit | Attending: Internal Medicine | Admitting: Internal Medicine

## 2011-06-07 DIAGNOSIS — R1011 Right upper quadrant pain: Secondary | ICD-10-CM | POA: Insufficient documentation

## 2011-06-07 MED ORDER — SINCALIDE 5 MCG IJ SOLR: 0.0200 ug/kg | Freq: Once | INTRAMUSCULAR | Status: DC

## 2011-06-07 MED ORDER — TECHNETIUM TC 99M MEBROFENIN IV KIT: 5.5000 | PACK | Freq: Once | INTRAVENOUS | Status: AC | PRN

## 2011-06-10 ENCOUNTER — Telehealth: Payer: Self-pay | Admitting: *Deleted

## 2011-06-10 NOTE — Telephone Encounter (Signed)
Spoke with [name] [name] and scheduled her for Pre Visit on 06/16/11 at 2pm and her EGD for 06/24/11 at 0830am. [name] [name] instructed to call for worsening problems or pain. She has Zofran and will call if it stops working. [name] [name] verbalized understanding.

## 2011-06-10 NOTE — Telephone Encounter (Signed)
Message copied by Florene Glen on Thu Jun 10, 2011  2:41 PM ------      Message from: Beverley Fiedler      Created: Wed Jun 09, 2011  9:38 AM       Aram Beecham      Please let the patient know that her HIDA scan was normal (the gallbladder appears to be squeezing normally)      Therefore, I do not think her GB is the reason for her pain.      She can f/u with me in the office if this pain is still a big concern for her, otherwise f/u with me in 6 months re: NAFLD.      Thanks

## 2011-06-10 NOTE — Telephone Encounter (Signed)
Chart reviewed. Given her abdominal pain, history of nausea vomiting which is now recurrent, EGD is reasonable Please schedule for EGD next available Patient should continue Zofran when necessary for nausea and vomiting Contact us if no better or worsening before EGD Thank you

## 2011-06-10 NOTE — Telephone Encounter (Signed)
Spoke with pt to inform her the HIDA scan was normal and he does not think the GB is the source of her pain. Pt reports she threw up all day Saturday and the staff had a hard time starting her IV for the HIDA d/t dehydration. Pt feels she needs to be seen and the earliest appt with Dr Rhea Belton is 07/02/11 at 1000am.  Pt stated Dr Rhea Belton mentioned doing an EGD, but I do not see it his notes. Schedule for EGD, Dr Rhea Belton?

## 2011-06-16 ENCOUNTER — Ambulatory Visit (AMBULATORY_SURGERY_CENTER): Payer: PRIVATE HEALTH INSURANCE | Admitting: Internal Medicine

## 2011-06-16 DIAGNOSIS — R109 Unspecified abdominal pain: Secondary | ICD-10-CM

## 2011-06-17 NOTE — Progress Notes (Signed)
Pre visit

## 2011-06-24 ENCOUNTER — Encounter: Payer: Self-pay | Admitting: Internal Medicine

## 2011-06-24 ENCOUNTER — Ambulatory Visit (AMBULATORY_SURGERY_CENTER): Payer: PRIVATE HEALTH INSURANCE | Admitting: Internal Medicine

## 2011-06-24 ENCOUNTER — Telehealth: Payer: Self-pay | Admitting: *Deleted

## 2011-06-24 VITALS — BP 126/91 | HR 94 | Temp 97.9°F | Resp 15 | Ht 62.0 in | Wt 214.0 lb

## 2011-06-24 DIAGNOSIS — R109 Unspecified abdominal pain: Secondary | ICD-10-CM

## 2011-06-24 DIAGNOSIS — K2970 Gastritis, unspecified, without bleeding: Secondary | ICD-10-CM

## 2011-06-24 DIAGNOSIS — K2940 Chronic atrophic gastritis without bleeding: Secondary | ICD-10-CM

## 2011-06-24 MED ORDER — SODIUM CHLORIDE 0.9 % IV SOLN: 500.0000 mL | INTRAVENOUS | Status: DC

## 2011-06-24 NOTE — Patient Instructions (Signed)
Please refer to your blue and neon green sheets for instructions regarding diet and activity for the rest of today.  You may resume your medications as you would normally take them, please avoid NSAIDS.  Gastritis Gastritis is an irritation of the stomach. This is often caused by medications, but can be from anything that bothers the stomach.  Other stomach irritants are:  Alcohol.  Caffeine.   Nicotine.   Spicy or acid foods.   Medications for pain and arthritis. Aspirin and other anti-inflammatory medicines such as ibuprofen (Advil), naproxen (Aleve), and ketoprofen (Orudis) can be highly irritating.  Emotional distress.   Symptoms of gastritis may include:  Abdominal pain.  Indigestion.   Nausea and or vomiting.  Bleeding.   Some patients with chronic gastritis and ulcers have been infected by a germ. They may need special testing. Medications which kill germs can be used to cure this condition. Treatment includes avoiding the substances mentioned above that are known to cause stomach trouble. Medications used to treat gastritis can include:  Antacids.  Medicines to control vomiting.   Acid blocking medicines.   Symptoms of gastritis usually improve within 2-3 days of starting treatment. Call your caregiver if you are not better in a few days. SEEK MEDICAL CARE IF YOU:   Have increased stomach or chest pain.  Vomit blood.   Faint or feel light headed.   Can not keep fluids down.  Pass bloody or black stools.   Develop severe back pain.   MAKE SURE YOU:   Understand these instructions.   Will watch your condition.   Will get help right away if you are not doing well or get worse.  Document Released: 09/27/2005 Document Re-Released: 12/24/2008 [facility] Patient Information 2011 [place], [place].  Helicobacter Pylori and Ulcer Disease An ulcer may be in your stomach (gastric ulcer) or in the first part of your small bowel, which is called the duodenum (duodenal  ulcer). An ulcer is a break in the stomach or duodenum lining. The break wears down into the deeper tissue. Helicobacter pylori (H. pylori) is a type of germ (bacteria) that may cause the majority of gastric or duodenal ulcers. CAUSES  A germ (bacterium). H. pylori can weaken the protective mucous coating of the stomach and duodenum. This allows acid to get through to the sensitive lining of the stomach or duodenum and an ulcer can then form.   Certain medications.   Using substances that can bother the lining of the stomach (alcohol, tobacco or medications such as Advil or Motrin) in the presence of H.pylori infection. This can increase the chances of getting an ulcer.   Cancer (rarely).  Most people infected with H. pylori do not get ulcers. It is not known how people catch H. pylori. It may be through food or water. H. pylori has been found in the saliva of some infected people. Therefore, the bacteria may also spread through mouth-to-mouth contact such as kissing. SYMPTOMS The problems (symptoms) of ulcer disease are usually:  A burning or gnawing of the mid-upper belly (abdomen). This is often worse on an empty stomach. It may get better with food. This may be associated with feeling sick to your stomach (nausea), bloating and vomiting.   If the ulcer results in bleeding, it can cause:   Black, tarry stools.   Throwing up bright red blood.   Throwing up coffee ground looking materials.   With severe bleeding, there may be loss of consciousness and shock. Besides ulcer disease,  H. pylori can also cause chronic gastritis (irritation of the lining of the stomach without ulcer) or stomach acid-type discomfort. You may not have symptoms even though you have an H. pylori infection. Although this is an infection, you may not have usual infection symptoms (such as fever). DIAGNOSIS Ulcer disease can be diagnosed in many different ways. If you have an ulcer, it is important to know whether or  not it is caused by H. Pylori. Treatment for an ulcer caused by H. pylori is different from that for an ulcer with other causes. The best way to detect H. pylori is taking tissue directly from the ulcer during an endoscopy test.   An endoscopy is an exam that uses an endoscope. This is a thin, lighted tube with a small camera on the end. It is like a flexible telescope. The patient is given a drug to make them calm (sedative). The caregiver eases the endoscope into the mouth and down the throat to the stomach and duodenum. This allows the doctor to see the lining of the esophagus, stomach and duodenum.   If an endoscopy is not needed, then H. pylori can be detected with tests of the blood, stool or even breath.  TREATMENT  H. pylori peptic ulcer treatment usually involves a combination of:   Medicines that kill germs (antibiotics).  Acid suppressors.   Stomach protectors.    The use of only one medication to treat H. pylori is not recommended. The most proven treatment is a 2 week course of treatment called triple therapy. It involves taking two antibiotics to kill the bacteria and either an acid suppressor or stomach-lining shield. Two-week triple therapy reduces ulcer symptoms, kills the bacteria, and prevents ulcers from coming back in many patients.   Unfortunately, patients may find triple therapy hard to do. This is because it involves taking as many as 20 pills a day. Also, the antibiotics used in triple therapy may cause mild side effects. These include nausea, vomiting, diarrhea, dark stools, a metallic taste in the mouth, dizziness, headache and yeast infections in women. Talk to your caregiver if you have any of these side effects.  HOME CARE INSTRUCTIONS  Take your medications as directed and for as long as prescribed. Contact your caregiver if you have problems or side effects from your medications.   Continue regular work and usual activities unless told otherwise by your  caregiver.   Avoid tobacco, alcohol and caffeine. Tobacco use will decrease and slow healing.   Avoid medications that are harmful. This includes aspirin and NSAIDS such as ibuprofen and naproxen.   Avoid foods that seem to aggravate or cause discomfort.   There are many over-the-counter products available to control stomach acid and other symptoms. Discuss these with your caregiver before using them. DO NOT stop taking prescription medications for over-the-counter medications without talking with your caregiver.   Special diets are not usually needed.   Keep any follow-up appointments and blood tests as directed.  SEEK MEDICAL CARE IF:  Your pain or other ulcer symptoms do not improve within a few days of starting treatment.   You develop diarrhea. This can be a problem related to certain treatments.   You have ongoing indigestion or heartburn even if your main ulcer symptoms are improved.   You think you have any side effects from your medications or if you do not understand how to use your medications right.  SEEK IMMEDIATE MEDICAL CARE IF: Any of the following happen:  You  develop bright red, rectal bleeding.   You develop dark black, tarry stools.   You throw up (vomit) blood.   You become light-headed, weak, have fainting episodes, or become sweaty, cold and clammy.   You have severe abdominal pain not controlled by medications. DO NOT take pain medications unless ordered by your caregiver.  MAKE SURE YOU:   Understand these instructions.   Will watch your condition.   Will get help right away if you are not doing well or get worse.  Document Released: 12/18/2003 Document Re-Released: 09/09/2008 [facility] Patient Information 2011 [place], [place].

## 2011-06-24 NOTE — Telephone Encounter (Signed)
Message copied by Florene Glen on Thu Jun 24, 2011 10:24 AM ------      Message from: Beverley Fiedler      Created: Thu Jun 24, 2011  9:28 AM      Regarding: Follow-up       Aram Beecham      Can you arrange a f/u for this pt in clinic in about 3 months...dx NAFLD.  Thanks.

## 2011-06-24 NOTE — Telephone Encounter (Signed)
Per Dr Rhea Belton, we will wait until the path is back on the pt to decide when to r/s her. Notified pt who stated understanding,

## 2011-06-24 NOTE — Telephone Encounter (Signed)
Dr Rhea Belton, called pt to inform her I will schedule an appt in 3 months when our appointment calendar is update. Pt reminded me she has an appt on 07/02/11 at 10am to F/U HIDA scan. Do we cancel the 07/02/11 appt?  ( I'm sorry, I didn't ask how she was doing and she didn't offer anything ).

## 2011-06-25 ENCOUNTER — Telehealth: Payer: Self-pay | Admitting: *Deleted

## 2011-06-25 LAB — GLUCOSE, CAPILLARY: Glucose-Capillary: 138 mg/dL — ABNORMAL HIGH (ref 70–99)

## 2011-06-25 NOTE — Telephone Encounter (Signed)
No ID on voice mail.  No message left at this time. 

## 2011-07-01 NOTE — Telephone Encounter (Signed)
Per DrPyrtle:   Please call patient and let her know the results of her gastric biopsies were negative for H. pylori. She has mild chronic gastritis only. I recommend she continue her acid suppression therapy for now. She has an appointment for Friday, though I'm not sure this needs to be kept unless abdominal pain continues to be a big issue. If the patient feels like she needs to be seen then that is fine, otherwise I will see her in about 3 months in followup for NAFLD. Her ultrasound and subsequent [facility] HIDA were unrevealing. Thank you  Left message on cell phone for pt to call me back.

## 2011-07-01 NOTE — Telephone Encounter (Signed)
Pt left a message she will just come back in 3 months- she doesn't need to come in tomorrow. I will call her when the schedule for December comes out. Reminder sent for 3 months. lmom for pt.

## 2011-07-02 ENCOUNTER — Ambulatory Visit: Payer: PRIVATE HEALTH INSURANCE | Admitting: Internal Medicine

## 2011-07-15 ENCOUNTER — Encounter: Payer: Self-pay | Admitting: Internal Medicine

## 2011-07-15 ENCOUNTER — Ambulatory Visit (INDEPENDENT_AMBULATORY_CARE_PROVIDER_SITE_OTHER): Payer: PRIVATE HEALTH INSURANCE | Admitting: *Deleted

## 2011-07-15 ENCOUNTER — Other Ambulatory Visit: Payer: Self-pay | Admitting: Internal Medicine

## 2011-07-15 DIAGNOSIS — I495 Sick sinus syndrome: Secondary | ICD-10-CM

## 2011-07-18 LAB — REMOTE PACEMAKER DEVICE
AL AMPLITUDE: 2.8 mv
AL IMPEDENCE PM: 412 Ohm
AL THRESHOLD: 0.375 V
BATTERY VOLTAGE: 2.79 V
RV LEAD AMPLITUDE: 22.4 mv

## 2011-07-20 ENCOUNTER — Encounter: Payer: Self-pay | Admitting: *Deleted

## 2011-07-26 NOTE — Progress Notes (Signed)
Pacer remote check  

## 2011-08-11 ENCOUNTER — Encounter: Payer: Self-pay | Admitting: *Deleted

## 2011-08-11 ENCOUNTER — Encounter: Payer: PRIVATE HEALTH INSURANCE | Attending: Family Medicine | Admitting: *Deleted

## 2011-08-11 DIAGNOSIS — Z713 Dietary counseling and surveillance: Secondary | ICD-10-CM | POA: Insufficient documentation

## 2011-08-11 DIAGNOSIS — E119 Type 2 diabetes mellitus without complications: Secondary | ICD-10-CM | POA: Insufficient documentation

## 2011-08-11 NOTE — Patient Instructions (Addendum)
Goals:  Follow Diabetes Meal Plan as instructed (see yellow card)  Eat 3 meals and 1 snacks; or every 3-5 hrs  Add lean protein foods to all meals/snacks  Limit sugar to <10 grams per serving  Monitor glucose levels as instructed by your doctor  Aim for >30 mins of physical activity daily  Increase fiber to 25-30 grams daily

## 2011-08-11 NOTE — Progress Notes (Signed)
Medical Nutrition Therapy:  Appt start time: 0830 end time:  0930.  Assessment:  Primary concerns today: T2DM.  States A1c was 7.1% in 01/2011 and now back 5.8% (06/2011).  Does not exercise, but works a physically demanding job.  Pt avoids rice and sweet tea d/t resulting sx of racing heart and sweats.  Also reports ~30 lb weight loss since June 2012 d/t decreased appetite from illness (fatty liver dz) and eats only 2 meals daily with no snacks; often only 1 pc fruit for meal. Pt not consuming enough calories and wt loss likely from lean muscle. Pt reports chronic pain level of 2 out of 5.      MEDICATIONS: See medication list; reconciled with patient   DIETARY INTAKE:  Usual eating pattern includes 2 meals and 0 snacks per day.  24-hr recall:  B ( AM): apple or banana; diet drink Snk ( AM): none  L ( PM): salad w/ chicken, balsalmic drsg; water Snk ( PM): diet drink D ( PM): Ham sandwich w/ whitewheat, mayo/mustard, banana OR 2 bananas; water  Snk ( PM): none Beverages: water, 2 diet drinks/day  Usual physical activity: no structured activity  Estimated energy needs: 1200 calories 135 g carbohydrates 90 g protein 35 g fat  Progress Towards Goal(s):  In progress.   Nutritional Diagnosis:  [place]-2.1 Impaired nutrient utilization related to excessive CHO intake as evidenced by A1c of    and 24-hour food recall.    Intervention/Goals:  Follow Diabetes Meal Plan as instructed (see yellow card)  Eat 3 meals and 1 snacks; or every 3-5 hrs  Add lean protein foods to all meals/snacks  Limit sugar to <10 grams per serving  Monitor glucose levels as instructed by your doctor  Aim for >30 mins of physical activity daily  Increase fiber to 25-30 grams daily  Handouts given during visit include:  Living Well with DM - [employer]  CHO/Pro snack list  g CHO 7 day meal plan  [facility] Heart Healthy Eating booklet  Monitoring/Evaluation:  Dietary intake, exercise, A1c (as available),  BG trends, and body weight in 6 week(s).

## 2011-09-22 ENCOUNTER — Telehealth: Payer: Self-pay | Admitting: *Deleted

## 2011-09-22 NOTE — Telephone Encounter (Signed)
lmom for pt to call back. She needs a 3 month f/u visit with Dr Rhea Belton.

## 2011-09-24 NOTE — Telephone Encounter (Signed)
lmom for pt to call back to schedule a f/u appt. 

## 2011-09-29 ENCOUNTER — Ambulatory Visit: Payer: PRIVATE HEALTH INSURANCE | Admitting: *Deleted

## 2011-10-18 ENCOUNTER — Ambulatory Visit (INDEPENDENT_AMBULATORY_CARE_PROVIDER_SITE_OTHER): Payer: PRIVATE HEALTH INSURANCE | Admitting: Family Medicine

## 2011-10-18 DIAGNOSIS — J45909 Unspecified asthma, uncomplicated: Secondary | ICD-10-CM

## 2011-10-18 DIAGNOSIS — I10 Essential (primary) hypertension: Secondary | ICD-10-CM

## 2011-10-19 ENCOUNTER — Encounter: Payer: Self-pay | Admitting: Internal Medicine

## 2011-10-19 ENCOUNTER — Ambulatory Visit (INDEPENDENT_AMBULATORY_CARE_PROVIDER_SITE_OTHER): Payer: PRIVATE HEALTH INSURANCE | Admitting: Internal Medicine

## 2011-10-19 DIAGNOSIS — R55 Syncope and collapse: Secondary | ICD-10-CM | POA: Insufficient documentation

## 2011-10-19 DIAGNOSIS — I10 Essential (primary) hypertension: Secondary | ICD-10-CM

## 2011-10-19 DIAGNOSIS — Z950 Presence of cardiac pacemaker: Secondary | ICD-10-CM

## 2011-10-19 DIAGNOSIS — I495 Sick sinus syndrome: Secondary | ICD-10-CM

## 2011-10-19 LAB — PACEMAKER DEVICE OBSERVATION
AL IMPEDENCE PM: 407 Ohm
AL THRESHOLD: 0.5 V
ATRIAL PACING PM: 0
BATTERY VOLTAGE: 2.79 V
RV LEAD AMPLITUDE: 8 mv
RV LEAD IMPEDENCE PM: 505 Ohm

## 2011-10-19 NOTE — Assessment & Plan Note (Signed)
Well controlled 

## 2011-10-19 NOTE — Progress Notes (Signed)
HPI  [name] [name] is a 56 y.o. female iseen in followup for recurrent presyncope associated with documented sinus node dysfunction. She is now status post pacemaker implantation.She has had no recurrent spell since pacer implant.  The patient denies chest pain, shortness of breath, nocturnal dyspnea, orthopnea or peripheral edema.  There have been no palpitations, lightheadedness or syncope.  40 pounds. When I asked her how her diabetes was she said "5.5"  Past Medical History  Diagnosis Date  . Fatty liver 04/2011  . Hypertension   . OSA (obstructive sleep apnea)     C-Pap  . Obesity   . Asthma   . Migraine   . Anal fissure   . Pacemaker     medtronic  . Colon polyps     diverticulosis  03-2011/2005  . Diverticulosis   . GERD (gastroesophageal reflux disease)   . Hyperlipidemia   . Diabetes mellitus 2012    T2DM  . Sinoatrial node dysfunction     syncope    Past Surgical History  Procedure Date  . Pacemaker placement   . Abdominal hysterectomy   . Oophorectomy   . Tonsillectomy     Current Outpatient Prescriptions  Medication Sig Dispense Refill  . albuterol (PROAIR HFA) 108 (90 BASE) MCG/ACT inhaler Inhale 2 puffs into the lungs as needed.        . ALPRAZolam (XANAX) 0.25 MG tablet Take 0.25 mg by mouth as needed.        . beclomethasone (QVAR) 80 MCG/ACT inhaler Inhale 1 puff into the lungs as needed.        . benazepril (LOTENSIN) 20 MG tablet Take 20 mg by mouth 2 (two) times daily.        . chlorthalidone (HYGROTON) 25 MG tablet Take 25 mg by mouth daily.        . Cholecalciferol (VITAMIN D) 2000 UNITS CAPS Take by mouth.        . citalopram (CELEXA) 20 MG tablet Take 20 mg by mouth daily.        . Colesevelam HCl (WELCHOL PO) Take by mouth daily. 1/2 of powder packet       . diclofenac (VOLTAREN) 75 MG EC tablet Take 75 mg by mouth 2 (two) times daily.        . diphenhydrAMINE (BENADRYL ALLERGY) 25 mg capsule Take 25 mg by mouth every 6 (six) hours as needed.         . fish oil-omega-3 fatty acids 1000 MG capsule Take 2 g by mouth daily.        . fluticasone (VERAMYST) 27.5 MCG/SPRAY nasal spray Place 2 sprays into the nose 2 (two) times daily.        Marland Kitchen HYDROcodone-acetaminophen (VICODIN) 5-500 MG per tablet Take 0.5 tablets by mouth every 6 (six) hours.       . hydrOXYzine (ATARAX) 25 MG tablet Take 25 mg by mouth as needed.        . metFORMIN (GLUCOPHAGE-XR) 500 MG 24 hr tablet Take 500 mg by mouth 2 (two) times daily.        . Multiple Vitamin (MULTIVITAMIN) tablet Take 1 tablet by mouth daily.        Marland Kitchen NIFEdipine (PROCARDIA XL/ADALAT-CC) 60 MG 24 hr tablet Take 60 mg by mouth daily.        Marland Kitchen omeprazole (PRILOSEC) 20 MG capsule Take 20 mg by mouth daily.        . ondansetron (ZOFRAN) 8 MG tablet Take by mouth every  8 (eight) hours as needed.        Marland Kitchen Phenylephrine HCl (VICKS SINEX NA) Place into the nose as needed.        . triamcinolone (KENALOG) 0.1 % cream Apply 1 application topically as needed.        . zolpidem (AMBIEN) 10 MG tablet Take 10 mg by mouth at bedtime as needed.        . cetirizine (ZYRTEC) 10 MG tablet Take 10 mg by mouth daily.          Allergies  Allergen Reactions  . Adhesive (Tape)   . Penicillins   . Red Yeast Rice Hives  . Sulfa Drugs Cross Reactors Hives    Review of Systems negative except from HPI and PMH  Physical Exam BP 125/76  Pulse 69  Ht 5\' 2"  (1.575 m)  Wt 203 lb 6.4 oz (92.262 kg)  BMI 37.20 kg/m2 Well developed and well nourished in no acute distress HENT normal E scleral and icterus clear Neck Supple JVP flat; carotids brisk and full Clear to ausculation Regular rate and rhythm, no murmurs gallops or rub Soft with active bowel sounds No clubbing cyanosis none Edema Alert and oriented, grossly normal motor and sensory function Skin Warm and Dry   Assessment and  Plan

## 2011-10-19 NOTE — Assessment & Plan Note (Signed)
Less than 1% atrial pacing. She has had 2 lightheaded episodes. These were both associated with laughimg

## 2011-10-19 NOTE — Patient Instructions (Signed)
Your physician wants you to follow-up in: 1 year with Dr. Graciela Husbands. You will receive a reminder letter in the mail two months in advance. If you don't receive a letter, please call our office to schedule the follow-up appointment.  Remote monitoring is used to monitor your Pacemaker of ICD from home. This monitoring reduces the number of office visits required to check your device to one time per year. It allows [place] to keep an eye on the functioning of your device to ensure it is working properly. You are scheduled for a device check from home on 01/20/12. You may send your transmission at any time that day. If you have a wireless device, the transmission will be sent automatically. After your physician reviews your transmission, you will receive a postcard with your next transmission date.  Your physician recommends that you continue on your current medications as directed. Please refer to the Current Medication list given to you today.

## 2011-10-19 NOTE — Assessment & Plan Note (Signed)
No recurrent syncope 

## 2011-10-19 NOTE — Assessment & Plan Note (Signed)
The patient's device was interrogated.  The information was reviewed. No changes were made in the programming.    

## 2011-10-27 ENCOUNTER — Encounter: Payer: PRIVATE HEALTH INSURANCE | Attending: Family Medicine | Admitting: *Deleted

## 2011-10-27 ENCOUNTER — Ambulatory Visit: Payer: PRIVATE HEALTH INSURANCE | Admitting: *Deleted

## 2011-10-27 ENCOUNTER — Encounter: Payer: Self-pay | Admitting: *Deleted

## 2011-10-27 DIAGNOSIS — Z713 Dietary counseling and surveillance: Secondary | ICD-10-CM | POA: Insufficient documentation

## 2011-10-27 DIAGNOSIS — E119 Type 2 diabetes mellitus without complications: Secondary | ICD-10-CM | POA: Insufficient documentation

## 2011-10-27 NOTE — Progress Notes (Signed)
Medical Nutrition Therapy:  Appt start time:  3:30 end time:  4:00.  Primary concerns today: T2DM; Follow up. States A1c was 7.1% in 01/2011 and now back 5.5% (10/18/2011).  Walks 30 min 3x/wk.  Reports being cold all the time and reflux. Consumes 3 meals day plus a morning snack. Still has a decreased appetite, but eating now. Hopes to get under 195 lbs to d/c one of her metformins.        MEDICATIONS: See medication list; reconciled with patient   DIETARY INTAKE:  Usual eating pattern includes 3 meals and 1 snacks per day.  Usual physical activity: Walks 30 min, 3x/week  Estimated energy needs: 1200 calories 135 g carbohydrates 90 g protein 35 g fat  Progress Towards Goal(s):  In progress.   Nutritional Diagnosis:  [place]-2.1 Impaired nutrient utilization related to excessive CHO intake as evidenced by A1c of 7.1% and 24-hour food recall.    Intervention/Goals:  Continue previous nutrition and exercise goals.  Contact me with any questions or concerns.  Handouts given during visit include:  Mr. Melissa Montane and Easy Diabetic Cooking  The New Soul Food Cookbook for People with Diabetes  Monitoring/Evaluation:  Dietary intake, exercise, A1c (as available), BG trends, and body weight in 3 month(s).

## 2011-10-27 NOTE — Patient Instructions (Signed)
Goals:  Continue previous nutrition and exercise goals.  Contact me with any questions or concerns.

## 2011-11-17 ENCOUNTER — Other Ambulatory Visit: Payer: Self-pay | Admitting: Family Medicine

## 2011-11-17 ENCOUNTER — Other Ambulatory Visit: Payer: Self-pay | Admitting: Internal Medicine

## 2011-11-17 MED ORDER — HYDROCODONE-ACETAMINOPHEN 5-500 MG PO TABS: 0.5000 | ORAL_TABLET | Freq: Every day | ORAL | Status: DC

## 2011-11-17 MED ORDER — ALPRAZOLAM 0.25 MG PO TABS: 0.2500 mg | ORAL_TABLET | ORAL | Status: DC | PRN

## 2011-12-13 ENCOUNTER — Other Ambulatory Visit: Payer: Self-pay | Admitting: Family Medicine

## 2012-01-03 ENCOUNTER — Ambulatory Visit (INDEPENDENT_AMBULATORY_CARE_PROVIDER_SITE_OTHER): Payer: PRIVATE HEALTH INSURANCE | Admitting: Internal Medicine

## 2012-01-03 DIAGNOSIS — Z23 Encounter for immunization: Secondary | ICD-10-CM

## 2012-01-03 MED ORDER — HEPATITIS B VAC RECOMBINANT 5 MCG/0.5ML IJ SUSP
0.5000 mL | Freq: Once | INTRAMUSCULAR | Status: AC
  Administered 2012-01-03: 5 ug via INTRAMUSCULAR

## 2012-01-14 ENCOUNTER — Other Ambulatory Visit: Payer: Self-pay | Admitting: Family Medicine

## 2012-01-20 ENCOUNTER — Ambulatory Visit (INDEPENDENT_AMBULATORY_CARE_PROVIDER_SITE_OTHER): Payer: PRIVATE HEALTH INSURANCE | Admitting: *Deleted

## 2012-01-20 DIAGNOSIS — I495 Sick sinus syndrome: Secondary | ICD-10-CM

## 2012-01-24 ENCOUNTER — Encounter: Payer: Self-pay | Admitting: Internal Medicine

## 2012-01-25 ENCOUNTER — Ambulatory Visit: Payer: PRIVATE HEALTH INSURANCE | Admitting: *Deleted

## 2012-01-25 ENCOUNTER — Other Ambulatory Visit: Payer: Self-pay | Admitting: Family Medicine

## 2012-01-25 LAB — REMOTE PACEMAKER DEVICE
AL IMPEDENCE PM: 424 Ohm
AL THRESHOLD: 0.375 V
RV LEAD AMPLITUDE: 16 mv
RV LEAD IMPEDENCE PM: 496 Ohm
RV LEAD THRESHOLD: 0.625 V

## 2012-01-27 NOTE — Progress Notes (Signed)
Remote pacer check  

## 2012-02-15 ENCOUNTER — Ambulatory Visit: Payer: PRIVATE HEALTH INSURANCE | Admitting: *Deleted

## 2012-02-22 ENCOUNTER — Other Ambulatory Visit: Payer: Self-pay | Admitting: Internal Medicine

## 2012-02-22 ENCOUNTER — Other Ambulatory Visit: Payer: Self-pay | Admitting: Physician Assistant

## 2012-03-22 ENCOUNTER — Ambulatory Visit (INDEPENDENT_AMBULATORY_CARE_PROVIDER_SITE_OTHER): Payer: PRIVATE HEALTH INSURANCE | Admitting: Emergency Medicine

## 2012-03-22 VITALS — BP 94/63 | HR 96 | Temp 98.7°F | Resp 16 | Ht 62.75 in | Wt 200.8 lb

## 2012-03-22 DIAGNOSIS — R599 Enlarged lymph nodes, unspecified: Secondary | ICD-10-CM

## 2012-03-22 DIAGNOSIS — L439 Lichen planus, unspecified: Secondary | ICD-10-CM

## 2012-03-22 DIAGNOSIS — J029 Acute pharyngitis, unspecified: Secondary | ICD-10-CM

## 2012-03-22 DIAGNOSIS — R591 Generalized enlarged lymph nodes: Secondary | ICD-10-CM

## 2012-03-22 LAB — POCT CBC
Granulocyte percent: 75.3 %G (ref 37–80)
HCT, POC: 39.7 % (ref 37.7–47.9)
Hemoglobin: 12.9 g/dL (ref 12.2–16.2)
MCV: 88.5 fL (ref 80–97)
POC Granulocyte: 6.9 (ref 2–6.9)
RBC: 4.49 M/uL (ref 4.04–5.48)

## 2012-03-22 LAB — POCT RAPID STREP A (OFFICE): Rapid Strep A Screen: NEGATIVE

## 2012-03-22 NOTE — Progress Notes (Signed)
  Subjective:    Patient ID: [name] [name], female    DOB: 05/25/1956, 56 y.o.   MRN: [id number]  HPI 56 yr old CF presents with sores in her mouth and a ST that is consistent w/ episodes of lichen planus that she was diagnosed with ~15 years ago.  Usually episodes have progressed slowly over a month or 2.  This episode has been going on for 5 days, and came on very quickly.  Associated with this she has lymphadenopathy in her neck which has not previously been present. No f/c. She went to the dentist. He gave her an Rx for lidocaine  And a topical oral steroid.   Review of Systems  All other systems reviewed and are negative.        Objective:   Physical Exam  Nursing note and vitals reviewed. Constitutional: She is oriented to person, place, and time. She appears well-developed and well-nourished.  HENT:  Head: Normocephalic and atraumatic.  Right Ear: External ear normal.  Left Ear: External ear normal.       Buccal mucosa with white and red plaques, throat with erythema, no exudate.  Cardiovascular: Normal rate and regular rhythm.   Murmur (II-III/VI) heard. Pulmonary/Chest: Effort normal and breath sounds normal.  Lymphadenopathy:    She has cervical adenopathy.       Right cervical: Superficial cervical (proximal 2cm mobile, TTP, soft) adenopathy present.       Left cervical: Superficial cervical (1cm TTP, mobile, soft) adenopathy present.    She has no axillary adenopathy.       Right: No supraclavicular adenopathy present.       Left: No supraclavicular adenopathy present.  Neurological: She is alert and oriented to person, place, and time.  Skin: Skin is warm and dry.     Results for orders placed in visit on 03/22/12  POCT CBC      Component Value Range   WBC 9.1  4.6 - 10.2 K/uL   Lymph, poc 1.5  0.6 - 3.4   POC LYMPH PERCENT 16.8  10 - 50 %L   MID (cbc) 0.7  0 - 0.9   POC MID % 7.9  0 - 12 %M   POC Granulocyte 6.9  2 - 6.9   Granulocyte percent 75.3  37 - 80  %G   RBC 4.49  4.04 - 5.48 M/uL   Hemoglobin 12.9  12.2 - 16.2 g/dL   HCT, POC 78.2  95.6 - 47.9 %   MCV 88.5  80 - 97 fL   MCH, POC 28.7  27 - 31.2 pg   MCHC 32.5  31.8 - 35.4 g/dL   RDW, POC 21.3     Platelet Count, POC 285  142 - 424 K/uL   MPV 7.6  0 - 99.8 fL  POCT RAPID STREP A (OFFICE)      Component Value Range   Rapid Strep A Screen Negative  Negative        Assessment & Plan:  Oral lichen planus exacerbation-use meds dentist gave. Cervical Lymphadenopathy-f/up in 3 weeks if not resolved

## 2012-03-28 ENCOUNTER — Other Ambulatory Visit: Payer: Self-pay | Admitting: Physician Assistant

## 2012-04-24 ENCOUNTER — Ambulatory Visit (INDEPENDENT_AMBULATORY_CARE_PROVIDER_SITE_OTHER): Payer: PRIVATE HEALTH INSURANCE | Admitting: *Deleted

## 2012-04-24 ENCOUNTER — Encounter: Payer: Self-pay | Admitting: Internal Medicine

## 2012-04-24 DIAGNOSIS — I495 Sick sinus syndrome: Secondary | ICD-10-CM

## 2012-04-28 LAB — REMOTE PACEMAKER DEVICE
AL AMPLITUDE: 2.8 mv
BATTERY VOLTAGE: 2.79 V
VENTRICULAR PACING PM: 0

## 2012-04-29 ENCOUNTER — Other Ambulatory Visit: Payer: Self-pay | Admitting: Family Medicine

## 2012-04-29 ENCOUNTER — Other Ambulatory Visit: Payer: Self-pay | Admitting: Physician Assistant

## 2012-04-30 NOTE — Telephone Encounter (Signed)
Needs to return to clinic 

## 2012-05-05 ENCOUNTER — Encounter: Payer: Self-pay | Admitting: Emergency Medicine

## 2012-05-25 ENCOUNTER — Encounter: Payer: Self-pay | Admitting: *Deleted

## 2012-05-30 ENCOUNTER — Encounter: Payer: Self-pay | Admitting: Family Medicine

## 2012-05-30 ENCOUNTER — Ambulatory Visit (INDEPENDENT_AMBULATORY_CARE_PROVIDER_SITE_OTHER): Payer: PRIVATE HEALTH INSURANCE | Admitting: Family Medicine

## 2012-05-30 VITALS — BP 124/74 | HR 78 | Temp 98.3°F | Resp 16 | Ht 63.0 in | Wt 206.0 lb

## 2012-05-30 DIAGNOSIS — R0602 Shortness of breath: Secondary | ICD-10-CM

## 2012-05-30 DIAGNOSIS — M79673 Pain in unspecified foot: Secondary | ICD-10-CM

## 2012-05-30 DIAGNOSIS — F419 Anxiety disorder, unspecified: Secondary | ICD-10-CM

## 2012-05-30 DIAGNOSIS — F411 Generalized anxiety disorder: Secondary | ICD-10-CM

## 2012-05-30 DIAGNOSIS — J309 Allergic rhinitis, unspecified: Secondary | ICD-10-CM

## 2012-05-30 DIAGNOSIS — I10 Essential (primary) hypertension: Secondary | ICD-10-CM

## 2012-05-30 DIAGNOSIS — E119 Type 2 diabetes mellitus without complications: Secondary | ICD-10-CM

## 2012-05-30 DIAGNOSIS — E785 Hyperlipidemia, unspecified: Secondary | ICD-10-CM

## 2012-05-30 DIAGNOSIS — G479 Sleep disorder, unspecified: Secondary | ICD-10-CM | POA: Insufficient documentation

## 2012-05-30 DIAGNOSIS — Z9109 Other allergy status, other than to drugs and biological substances: Secondary | ICD-10-CM

## 2012-05-30 DIAGNOSIS — M1990 Unspecified osteoarthritis, unspecified site: Secondary | ICD-10-CM

## 2012-05-30 DIAGNOSIS — K7689 Other specified diseases of liver: Secondary | ICD-10-CM

## 2012-05-30 DIAGNOSIS — L239 Allergic contact dermatitis, unspecified cause: Secondary | ICD-10-CM

## 2012-05-30 DIAGNOSIS — K760 Fatty (change of) liver, not elsewhere classified: Secondary | ICD-10-CM

## 2012-05-30 DIAGNOSIS — E7889 Other lipoprotein metabolism disorders: Secondary | ICD-10-CM

## 2012-05-30 DIAGNOSIS — K219 Gastro-esophageal reflux disease without esophagitis: Secondary | ICD-10-CM

## 2012-05-30 DIAGNOSIS — R079 Chest pain, unspecified: Secondary | ICD-10-CM

## 2012-05-30 LAB — COMPREHENSIVE METABOLIC PANEL
Albumin: 4.9 g/dL (ref 3.5–5.2)
BUN: 26 mg/dL — ABNORMAL HIGH (ref 6–23)
CO2: 28 mEq/L (ref 19–32)
Calcium: 10 mg/dL (ref 8.4–10.5)
Chloride: 102 mEq/L (ref 96–112)
Glucose, Bld: 109 mg/dL — ABNORMAL HIGH (ref 70–99)
Potassium: 4.5 mEq/L (ref 3.5–5.3)
Sodium: 140 mEq/L (ref 135–145)
Total Protein: 7.6 g/dL (ref 6.0–8.3)

## 2012-05-30 LAB — LIPID PANEL
Cholesterol: 304 mg/dL — ABNORMAL HIGH (ref 0–200)
LDL Cholesterol: 204 mg/dL — ABNORMAL HIGH (ref 0–99)
Triglycerides: 270 mg/dL — ABNORMAL HIGH (ref <150)

## 2012-05-30 MED ORDER — ONDANSETRON 8 MG PO TBDP: 8.0000 mg | ORAL_TABLET | Freq: Two times a day (BID) | ORAL | Status: DC | PRN

## 2012-05-30 MED ORDER — NIFEDIPINE ER OSMOTIC RELEASE 60 MG PO TB24: 60.0000 mg | ORAL_TABLET | Freq: Every day | ORAL | Status: DC

## 2012-05-30 MED ORDER — FLUTICASONE FUROATE 27.5 MCG/SPRAY NA SUSP: 2.0000 | Freq: Two times a day (BID) | NASAL | Status: DC

## 2012-05-30 MED ORDER — METFORMIN HCL ER 500 MG PO TB24: 500.0000 mg | ORAL_TABLET | Freq: Two times a day (BID) | ORAL | Status: DC

## 2012-05-30 MED ORDER — ALPRAZOLAM 0.25 MG PO TABS: 0.2500 mg | ORAL_TABLET | ORAL | Status: DC | PRN

## 2012-05-30 MED ORDER — CITALOPRAM HYDROBROMIDE 20 MG PO TABS: 20.0000 mg | ORAL_TABLET | Freq: Every day | ORAL | Status: DC

## 2012-05-30 MED ORDER — OMEPRAZOLE 20 MG PO CPDR: 20.0000 mg | DELAYED_RELEASE_CAPSULE | Freq: Every day | ORAL | Status: DC

## 2012-05-30 MED ORDER — DICLOFENAC SODIUM 75 MG PO TBEC: 75.0000 mg | DELAYED_RELEASE_TABLET | Freq: Two times a day (BID) | ORAL | Status: DC

## 2012-05-30 MED ORDER — HYDROCODONE-ACETAMINOPHEN 5-500 MG PO TABS: 0.5000 | ORAL_TABLET | Freq: Once | ORAL | Status: DC

## 2012-05-30 MED ORDER — ALBUTEROL SULFATE HFA 108 (90 BASE) MCG/ACT IN AERS: 2.0000 | INHALATION_SPRAY | RESPIRATORY_TRACT | Status: DC | PRN

## 2012-05-30 MED ORDER — ZOLPIDEM TARTRATE 10 MG PO TABS: 10.0000 mg | ORAL_TABLET | Freq: Every evening | ORAL | Status: DC | PRN

## 2012-05-30 MED ORDER — FLUOCINONIDE 0.05 % EX CREA: TOPICAL_CREAM | Freq: Two times a day (BID) | CUTANEOUS | Status: DC

## 2012-05-30 MED ORDER — BECLOMETHASONE DIPROPIONATE 80 MCG/ACT IN AERS: 1.0000 | INHALATION_SPRAY | RESPIRATORY_TRACT | Status: DC | PRN

## 2012-05-30 MED ORDER — HYDROXYZINE HCL 25 MG PO TABS: 25.0000 mg | ORAL_TABLET | ORAL | Status: DC | PRN

## 2012-05-30 MED ORDER — BENAZEPRIL HCL 20 MG PO TABS: 20.0000 mg | ORAL_TABLET | Freq: Every day | ORAL | Status: DC

## 2012-05-30 NOTE — Patient Instructions (Addendum)
Refrained from taking the chlorthalidone for now. Check your blood pressure and see what it does. This does have some sulfa and it, it could be contributing to some of your rash problems.  Exercise more and try the last to lose weight.  If the toenail give she troubles return  Take medications as directed.

## 2012-05-30 NOTE — Progress Notes (Addendum)
Subjective: Patient is here for refill of her medications. She is a long list of things, and a number of them she needs to have a short-term prescription of all the mail in is pending. She has been doing fairly well. 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 She is single. She has diabetes, but has a history of having good sugars. She sees an eye doctor on a yearly basis. He had to have a posterior lens laser. She has chronic problems with rashes. Her lungs are fairly stable with the inhaler, and she does not have to use her rescue inhaler very often. She ran a cart over her toe and has a black toenail and left large toe. Tries to walk 30 minutes a couple times a week. Review of systems fairly unremarkable.  Objective: Overweight white female in no acute distress. Eyes PERRLA. Throat clear. Neck supple. Chest clear. Heart regular without murmurs. Abdomen soft. Left large toenail is black.  Assessment: Diabetes Hypertension Peripheral neuropathy and foot pain Arthritis Running allergic dermatitis Chronic dyspnea and asthmatic symptoms Sleep disturbance Reflux Sleep apnea   Plan: Refill her medications for mail in pharmacy is and local. Advised her to return to come in for the medicines run out. We'll check scar couple of basic labs on her. With her number of problems she should come in every 3-4 months for recheck.  Results for orders placed in visit on 05/30/12  POCT GLYCOSYLATED HEMOGLOBIN (HGB A1C)      Component Value Range   Hemoglobin A1C 5.4

## 2012-06-06 ENCOUNTER — Other Ambulatory Visit: Payer: Self-pay | Admitting: Family Medicine

## 2012-06-06 DIAGNOSIS — I10 Essential (primary) hypertension: Secondary | ICD-10-CM

## 2012-06-06 MED ORDER — NIFEDIPINE ER OSMOTIC RELEASE 60 MG PO TB24: 60.0000 mg | ORAL_TABLET | Freq: Every day | ORAL | Status: DC

## 2012-06-09 ENCOUNTER — Telehealth: Payer: Self-pay

## 2012-06-09 MED ORDER — MOMETASONE FUROATE 50 MCG/ACT NA SUSP: 2.0000 | Freq: Every day | NASAL | Status: DC

## 2012-06-09 NOTE — Telephone Encounter (Signed)
I changed to Nasonex since it is covered for the patient.

## 2012-06-09 NOTE — Telephone Encounter (Signed)
Please call [facility] about pt's Veramyst. Her insurance will not cover this. They will cover Nasonex, Flunisolide, Flutiicsone, triamcinalone. [phone] ref number [id number]. Please advise

## 2012-06-13 ENCOUNTER — Telehealth: Payer: Self-pay | Admitting: Family Medicine

## 2012-06-13 NOTE — Telephone Encounter (Signed)
Received fax from [facility] stating that patient was rx'd duplicate therapy (both Nasonex and Veramyst) and we are needing to find out which one patient would prefer to be on.  LMOM for patient to CB.  Forms at nurses station.

## 2012-06-13 NOTE — Telephone Encounter (Signed)
Please call pt and she which she would like to be on please and that is the one that [facility] should fill.  Thanks

## 2012-06-13 NOTE — Telephone Encounter (Signed)
LMOM on H# and C# to CB

## 2012-06-14 MED ORDER — FLUTICASONE PROPIONATE 50 MCG/ACT NA SUSP: 2.0000 | Freq: Every day | NASAL | Status: DC

## 2012-06-14 NOTE — Telephone Encounter (Signed)
Pt CB and verified that she has always used Flonase (not Veramyst or Nasonex) if we can get that ordered from [facility]. Also she needs a prior auth done for the dissolving Zofran. Called [facility] and got PA approved for Zofran ODT and spoke w/pharmacist at [facility] to get both the Zofran ODT reordered and Flonase ordered instead of either of the other nasal sprays.

## 2012-06-14 NOTE — Telephone Encounter (Signed)
LMOM to CB. 

## 2012-07-21 ENCOUNTER — Ambulatory Visit (INDEPENDENT_AMBULATORY_CARE_PROVIDER_SITE_OTHER): Payer: Self-pay | Admitting: *Deleted

## 2012-07-21 ENCOUNTER — Encounter: Payer: Self-pay | Admitting: Internal Medicine

## 2012-07-21 DIAGNOSIS — Z950 Presence of cardiac pacemaker: Secondary | ICD-10-CM

## 2012-07-21 DIAGNOSIS — I495 Sick sinus syndrome: Secondary | ICD-10-CM

## 2012-07-26 LAB — REMOTE PACEMAKER DEVICE
AL AMPLITUDE: 2.8 mv
ATRIAL PACING PM: 0
RV LEAD THRESHOLD: 0.5 V
VENTRICULAR PACING PM: 0

## 2012-08-11 ENCOUNTER — Encounter: Payer: Self-pay | Admitting: *Deleted

## 2012-09-26 ENCOUNTER — Encounter: Payer: Self-pay | Admitting: Family Medicine

## 2012-09-26 ENCOUNTER — Ambulatory Visit (INDEPENDENT_AMBULATORY_CARE_PROVIDER_SITE_OTHER): Payer: PRIVATE HEALTH INSURANCE | Admitting: Family Medicine

## 2012-09-26 VITALS — BP 126/64 | HR 77 | Temp 98.3°F | Resp 16 | Ht 63.0 in | Wt 210.0 lb

## 2012-09-26 DIAGNOSIS — I10 Essential (primary) hypertension: Secondary | ICD-10-CM

## 2012-09-26 DIAGNOSIS — M1990 Unspecified osteoarthritis, unspecified site: Secondary | ICD-10-CM

## 2012-09-26 DIAGNOSIS — E119 Type 2 diabetes mellitus without complications: Secondary | ICD-10-CM

## 2012-09-26 DIAGNOSIS — R6889 Other general symptoms and signs: Secondary | ICD-10-CM

## 2012-09-26 DIAGNOSIS — K760 Fatty (change of) liver, not elsewhere classified: Secondary | ICD-10-CM

## 2012-09-26 DIAGNOSIS — M79673 Pain in unspecified foot: Secondary | ICD-10-CM

## 2012-09-26 DIAGNOSIS — Z23 Encounter for immunization: Secondary | ICD-10-CM

## 2012-09-26 DIAGNOSIS — G479 Sleep disorder, unspecified: Secondary | ICD-10-CM

## 2012-09-26 DIAGNOSIS — F419 Anxiety disorder, unspecified: Secondary | ICD-10-CM

## 2012-09-26 DIAGNOSIS — E785 Hyperlipidemia, unspecified: Secondary | ICD-10-CM

## 2012-09-26 LAB — BASIC METABOLIC PANEL
CO2: 25 mEq/L (ref 19–32)
Calcium: 9.5 mg/dL (ref 8.4–10.5)
Chloride: 106 mEq/L (ref 96–112)
Glucose, Bld: 90 mg/dL (ref 70–99)
Potassium: 4.6 mEq/L (ref 3.5–5.3)
Sodium: 141 mEq/L (ref 135–145)

## 2012-09-26 LAB — LDL CHOLESTEROL, DIRECT: Direct LDL: 178 mg/dL — ABNORMAL HIGH

## 2012-09-26 MED ORDER — CITALOPRAM HYDROBROMIDE 20 MG PO TABS: 20.0000 mg | ORAL_TABLET | Freq: Every day | ORAL | Status: DC

## 2012-09-26 MED ORDER — NIFEDIPINE ER OSMOTIC RELEASE 60 MG PO TB24: 60.0000 mg | ORAL_TABLET | Freq: Every day | ORAL | Status: DC

## 2012-09-26 MED ORDER — METFORMIN HCL ER 500 MG PO TB24: 500.0000 mg | ORAL_TABLET | Freq: Two times a day (BID) | ORAL | Status: DC

## 2012-09-26 MED ORDER — HYDROCODONE-ACETAMINOPHEN 5-500 MG PO TABS: 0.5000 | ORAL_TABLET | Freq: Once | ORAL | Status: DC

## 2012-09-26 MED ORDER — DICLOFENAC SODIUM 75 MG PO TBEC: 75.0000 mg | DELAYED_RELEASE_TABLET | Freq: Two times a day (BID) | ORAL | Status: DC

## 2012-09-26 MED ORDER — OMEPRAZOLE 20 MG PO CPDR: 20.0000 mg | DELAYED_RELEASE_CAPSULE | Freq: Every day | ORAL | Status: DC

## 2012-09-26 MED ORDER — BENAZEPRIL HCL 20 MG PO TABS: 20.0000 mg | ORAL_TABLET | Freq: Every day | ORAL | Status: DC

## 2012-09-26 MED ORDER — FLUTICASONE PROPIONATE 50 MCG/ACT NA SUSP: 2.0000 | Freq: Every day | NASAL | Status: DC

## 2012-09-26 MED ORDER — ZOLPIDEM TARTRATE 10 MG PO TABS: 10.0000 mg | ORAL_TABLET | Freq: Every evening | ORAL | Status: DC | PRN

## 2012-09-26 MED ORDER — CHLORTHALIDONE 25 MG PO TABS: 25.0000 mg | ORAL_TABLET | Freq: Every day | ORAL | Status: DC

## 2012-09-27 ENCOUNTER — Encounter: Payer: Self-pay | Admitting: Radiology

## 2012-09-27 NOTE — Progress Notes (Signed)
Quick Note:  Please call pt and advise that the following labs are abnormal... All thyroid labs are normal as well as blood sugar and basic chemistries and kidney function.   LDL ("bad") cholesterol is above recommended level. Work on Press photographer and staying active!   Copy to pt. ______

## 2012-09-28 NOTE — Progress Notes (Signed)
S;  This 56 y.o. Cauc female has multiple medical problems and was seen in Sept at [facility] by Dr. Alwyn Ren. He refilled her medications short-term and advised follow-up for chronic medical management. The pt has no new problems today.  Her ability to exercise and lose weight is limited by cardiac disease; she has a [employer] device which is monitored by Dr. Berton Mount. Besides med refills, she c/o cold intolerance. She used to be able to go out in 40-50 degree weather in just a tee shirt; lately, she feels cold and fatigued all the time. She denies any change in skin/ hair, GI function or mentation.   ROS: Negative for unexplained weight change, fever/chills, CP or tightness, palpitations, SOB or DOE, GI problems (constipation), myalgias, HA, dizziness, weakness, numbness or syncope.  O:  Filed Vitals:   09/26/12 1401  BP: 126/64  Pulse: 77  Temp: 98.3 F (36.8 C)  TempSrc: Oral  Resp: 16  Height: 5\' 3"  (1.6 m)  Weight: 210 lb (95.255 kg)  SpO2: 95%   GEN: In NAD: WN,WD. Overweight. HENT: [place]/AT. EOMI w/ clear conj/sclerae. Oroph moist and clear. NECK: Supple w/o LAN or TMG. COR: RRR. LUNGS: CTA w/ decreased BS at bases. No wheezes or rales. MS: MAEs; no c/c/e. NEURO: A&O x 3; CNs intact. DTRs 1+/=. Nonfocal.  A/P: 1. Cold intolerance  TSH, T3, Free, T4, Free, Vitamin D, 25-hydroxy  2. Hyperlipidemia  LDL Cholesterol, Direct  3. HYPERTENSION, BENIGN  NIFEdipine (PROCARDIA XL/ADALAT-CC) 60 MG 24 hr tablet, benazepril (LOTENSIN) 20 MG tablet, Basic metabolic panel  4. DM2 (diabetes mellitus, type 2)  metFORMIN (GLUCOPHAGE-XR) 500 MG 24 hr tablet, Basic metabolic panel  5. Need for prophylactic vaccination and inoculation against influenza  Flu vaccine greater than or equal to 3yo preservative free IM  6. Sleep disturbance  zolpidem (AMBIEN) 10 MG tablet      7. Osteoarthritis  diclofenac (VOLTAREN) 75 MG EC tablet  8. Anxiety  citalopram (CELEXA) 20 MG tablet  .

## 2012-10-06 ENCOUNTER — Telehealth: Payer: Self-pay | Admitting: *Deleted

## 2012-10-06 NOTE — Telephone Encounter (Signed)
Pharmacy needing directions on hydrocodone.

## 2012-10-09 NOTE — Telephone Encounter (Signed)
Review of medication: Hydrocodone-APAP directions- 1/2 -1 tablet once a day as needed for pain.

## 2012-10-11 NOTE — Telephone Encounter (Signed)
[facility] are closed for [facility], will try to call back tomorrow.

## 2012-10-12 NOTE — Telephone Encounter (Signed)
Called pharmacy to advise.

## 2012-10-26 ENCOUNTER — Telehealth: Payer: Self-pay

## 2012-10-26 DIAGNOSIS — G479 Sleep disorder, unspecified: Secondary | ICD-10-CM

## 2012-10-26 MED ORDER — ZOLPIDEM TARTRATE 10 MG PO TABS: ORAL_TABLET | ORAL | Status: DC

## 2012-10-26 NOTE — Telephone Encounter (Signed)
Pt sent a letter to Dr Audria Nine asking her to send in a 15 day supply of Ambien to [facility]. She also reported that [facility] will not fill 90/90 days Rx of Ambien without a PA. I spoke w/Dr Audria Nine who stated that she really does not want pt taking 10 mg of Ambien Qhs with all of the other meds she is on. Dr Audria Nine asked me to call pt and explain that she should only take the Ambien Q other hs or break in 1/2 and only take 5 mg Qhs. Dr Audria Nine gave VO to send in #15 AMbien 10 to [facility].  I spoke w/pt and she stated that she is in agreement w/only taking 1 tab Q other hs or 1/2 Qhs, but states that [facility] cancelled her last order bc it wasn't written in a way that they could fill it (she had copied the notice of Rx cancellation on the letter to [place]). Pt requested for [place] to send in #30 to local [facility] which will last her 60 days until her f/up w/Dr Audria Nine in March. Spoke w/Dr Audria Nine and she agreed so calling in #30 to [place].

## 2012-11-13 ENCOUNTER — Ambulatory Visit: Payer: BC Managed Care – PPO

## 2012-11-13 ENCOUNTER — Ambulatory Visit (INDEPENDENT_AMBULATORY_CARE_PROVIDER_SITE_OTHER): Payer: BC Managed Care – PPO | Admitting: Emergency Medicine

## 2012-11-13 VITALS — BP 124/76 | HR 70 | Temp 97.7°F | Resp 16 | Ht 62.5 in | Wt 213.8 lb

## 2012-11-13 DIAGNOSIS — R05 Cough: Secondary | ICD-10-CM

## 2012-11-13 DIAGNOSIS — R059 Cough, unspecified: Secondary | ICD-10-CM

## 2012-11-13 DIAGNOSIS — J45909 Unspecified asthma, uncomplicated: Secondary | ICD-10-CM

## 2012-11-13 DIAGNOSIS — J189 Pneumonia, unspecified organism: Secondary | ICD-10-CM

## 2012-11-13 MED ORDER — DOXYCYCLINE HYCLATE 100 MG PO CAPS: 100.0000 mg | ORAL_CAPSULE | Freq: Two times a day (BID) | ORAL | Status: DC

## 2012-11-13 NOTE — Patient Instructions (Addendum)
Cough, Adult  A cough is a reflex that helps clear your throat and airways. It can help heal the body or may be a reaction to an irritated airway. A cough may only last 2 or 3 weeks (acute) or may last more than 8 weeks (chronic).  CAUSES Acute cough:  Viral or bacterial infections. Chronic cough:  Infections.  Allergies.  Asthma.  Post-nasal drip.  Smoking.  Heartburn or acid reflux.  Some medicines.  Chronic lung problems (COPD).  Cancer. SYMPTOMS   Cough.  Fever.  Chest pain.  Increased breathing rate.  High-pitched whistling sound when breathing (wheezing).  Colored mucus that you cough up (sputum). TREATMENT   A bacterial cough may be treated with antibiotic medicine.  A viral cough must run its course and will not respond to antibiotics.  Your caregiver may recommend other treatments if you have a chronic cough. HOME CARE INSTRUCTIONS   Only take over-the-counter or prescription medicines for pain, discomfort, or fever as directed by your caregiver. Use cough suppressants only as directed by your caregiver.  Use a cold steam vaporizer or humidifier in your bedroom or home to help loosen secretions.  Sleep in a semi-upright position if your cough is worse at night.  Rest as needed.  Stop smoking if you smoke. SEEK IMMEDIATE MEDICAL CARE IF:   You have pus in your sputum.  Your cough starts to worsen.  You cannot control your cough with suppressants and are losing sleep.  You begin coughing up blood.  You have difficulty breathing.  You develop pain which is getting worse or is uncontrolled with medicine.  You have a fever. MAKE SURE YOU:   Understand these instructions.  Will watch your condition.  Will get help right away if you are not doing well or get worse. Document Released: 03/26/2011 Document Revised: 12/20/2011 Document Reviewed: 03/26/2011 [facility] Patient Information 2013 [place], [place]. Pneumonia, Adult Pneumonia is an  infection of the lungs.  CAUSES Pneumonia may be caused by bacteria or a virus. Usually, these infections are caused by breathing infectious particles into the lungs (respiratory tract). SYMPTOMS   Cough.  Fever.  Chest pain.  Increased rate of breathing.  Wheezing.  Mucus production. DIAGNOSIS  If you have the common symptoms of pneumonia, your caregiver will typically confirm the diagnosis with a chest X-ray. The X-ray will show an abnormality in the lung (pulmonary infiltrate) if you have pneumonia. Other tests of your blood, urine, or sputum may be done to find the specific cause of your pneumonia. Your caregiver may also do tests (blood gases or pulse oximetry) to see how well your lungs are working. TREATMENT  Some forms of pneumonia may be spread to other people when you cough or sneeze. You may be asked to wear a mask before and during your exam. Pneumonia that is caused by bacteria is treated with antibiotic medicine. Pneumonia that is caused by the influenza virus may be treated with an antiviral medicine. Most other viral infections must run their course. These infections will not respond to antibiotics.  PREVENTION A pneumococcal shot (vaccine) is available to prevent a common bacterial cause of pneumonia. This is usually suggested for:  People over 71 years old.  Patients on chemotherapy.  People with chronic lung problems, such as bronchitis or emphysema.  People with immune system problems. If you are over 65 or have a high risk condition, you may receive the pneumococcal vaccine if you have not received it before. In some countries, a  routine influenza vaccine is also recommended. This vaccine can help prevent some cases of pneumonia.You may be offered the influenza vaccine as part of your care. If you smoke, it is time to quit. You may receive instructions on how to stop smoking. Your caregiver can provide medicines and counseling to help you quit. HOME CARE  INSTRUCTIONS   Cough suppressants may be used if you are losing too much rest. However, coughing protects you by clearing your lungs. You should avoid using cough suppressants if you can.  Your caregiver may have prescribed medicine if he or she thinks your pneumonia is caused by a bacteria or influenza. Finish your medicine even if you start to feel better.  Your caregiver may also prescribe an expectorant. This loosens the mucus to be coughed up.  Only take over-the-counter or prescription medicines for pain, discomfort, or fever as directed by your caregiver.  Do not smoke. Smoking is a common cause of bronchitis and can contribute to pneumonia. If you are a smoker and continue to smoke, your cough may last several weeks after your pneumonia has cleared.  A cold steam vaporizer or humidifier in your room or home may help loosen mucus.  Coughing is often worse at night. Sleeping in a semi-upright position in a recliner or using a couple pillows under your head will help with this.  Get rest as you feel it is needed. Your body will usually let you know when you need to rest. SEEK IMMEDIATE MEDICAL CARE IF:   Your illness becomes worse. This is especially true if you are elderly or weakened from any other disease.  You cannot control your cough with suppressants and are losing sleep.  You begin coughing up blood.  You develop pain which is getting worse or is uncontrolled with medicines.  You have a fever.  Any of the symptoms which initially brought you in for treatment are getting worse rather than better.  You develop shortness of breath or chest pain. MAKE SURE YOU:   Understand these instructions.  Will watch your condition.  Will get help right away if you are not doing well or get worse. Document Released: 09/27/2005 Document Revised: 12/20/2011 Document Reviewed: 12/17/2010 [facility] Patient Information 2013 [place], [place].

## 2012-11-13 NOTE — Progress Notes (Signed)
  Subjective:    Patient ID: [name] [name], female    DOB: 1956/04/09, 57 y.o.   MRN: [id number]  HPI  Patient presents with a cough that started 3-4 weeks ago. It started as head congestion that had seemed to clear. She was unable to stop the cough and now it has settled into her chest. She states that she feels a rattle with breathing. She is using an albuterol and Qvar inhalers due to her asthma. She has had to use steroids when she gets this ill. She reports that the phlegm is yellowish/greenish and sometimes clear drainage from her nose.   Review of Systems     Objective:   Physical Exam H. EENT exam reveals TMs to be clear except a questionable fluid on the right. Nasopharynx is slightly congested clear cervical nodes present rales present left anterior chest. Her cardiac exam is unremarkable.  [facility] reading (PRIMARY) by  Dr. Cleta Alberts there is a mild infiltrate left lower lobe on the PA view        Assessment & Plan:  Go ahead and treat with doxycycline 100 twice a day. She is to take Mucinex. She is to continue both her albuterol inhaler and Qvar .

## 2012-12-15 ENCOUNTER — Encounter: Payer: BC Managed Care – PPO | Admitting: Family Medicine

## 2012-12-20 ENCOUNTER — Ambulatory Visit (INDEPENDENT_AMBULATORY_CARE_PROVIDER_SITE_OTHER): Payer: BC Managed Care – PPO | Admitting: Family Medicine

## 2012-12-20 ENCOUNTER — Encounter: Payer: Self-pay | Admitting: Family Medicine

## 2012-12-20 VITALS — BP 130/83 | HR 72 | Temp 97.7°F | Resp 16 | Ht 62.5 in | Wt 216.0 lb

## 2012-12-20 DIAGNOSIS — G479 Sleep disorder, unspecified: Secondary | ICD-10-CM

## 2012-12-20 DIAGNOSIS — I10 Essential (primary) hypertension: Secondary | ICD-10-CM

## 2012-12-20 DIAGNOSIS — K760 Fatty (change of) liver, not elsewhere classified: Secondary | ICD-10-CM

## 2012-12-20 DIAGNOSIS — M1990 Unspecified osteoarthritis, unspecified site: Secondary | ICD-10-CM

## 2012-12-20 DIAGNOSIS — R0600 Dyspnea, unspecified: Secondary | ICD-10-CM

## 2012-12-20 DIAGNOSIS — R0602 Shortness of breath: Secondary | ICD-10-CM

## 2012-12-20 DIAGNOSIS — L239 Allergic contact dermatitis, unspecified cause: Secondary | ICD-10-CM

## 2012-12-20 DIAGNOSIS — R0989 Other specified symptoms and signs involving the circulatory and respiratory systems: Secondary | ICD-10-CM

## 2012-12-20 DIAGNOSIS — R0609 Other forms of dyspnea: Secondary | ICD-10-CM

## 2012-12-20 DIAGNOSIS — Z9109 Other allergy status, other than to drugs and biological substances: Secondary | ICD-10-CM

## 2012-12-20 DIAGNOSIS — E785 Hyperlipidemia, unspecified: Secondary | ICD-10-CM

## 2012-12-20 DIAGNOSIS — F419 Anxiety disorder, unspecified: Secondary | ICD-10-CM

## 2012-12-20 DIAGNOSIS — E119 Type 2 diabetes mellitus without complications: Secondary | ICD-10-CM

## 2012-12-20 DIAGNOSIS — F411 Generalized anxiety disorder: Secondary | ICD-10-CM

## 2012-12-20 LAB — POCT GLYCOSYLATED HEMOGLOBIN (HGB A1C): Hemoglobin A1C: 5.6

## 2012-12-20 LAB — LIPID PANEL: LDL Cholesterol: 195 mg/dL — ABNORMAL HIGH (ref 0–99)

## 2012-12-20 MED ORDER — FLUTICASONE PROPIONATE 50 MCG/ACT NA SUSP: 2.0000 | Freq: Every day | NASAL | Status: DC

## 2012-12-20 MED ORDER — ALBUTEROL SULFATE HFA 108 (90 BASE) MCG/ACT IN AERS: 2.0000 | INHALATION_SPRAY | RESPIRATORY_TRACT | Status: DC | PRN

## 2012-12-20 MED ORDER — BECLOMETHASONE DIPROPIONATE 80 MCG/ACT IN AERS: 1.0000 | INHALATION_SPRAY | RESPIRATORY_TRACT | Status: DC | PRN

## 2012-12-20 MED ORDER — BENAZEPRIL HCL 20 MG PO TABS: 20.0000 mg | ORAL_TABLET | Freq: Every day | ORAL | Status: DC

## 2012-12-20 MED ORDER — CITALOPRAM HYDROBROMIDE 20 MG PO TABS: 20.0000 mg | ORAL_TABLET | Freq: Every day | ORAL | Status: DC

## 2012-12-20 MED ORDER — FLUOCINONIDE 0.05 % EX CREA: TOPICAL_CREAM | Freq: Two times a day (BID) | CUTANEOUS | Status: DC

## 2012-12-20 MED ORDER — OMEPRAZOLE 20 MG PO CPDR: 20.0000 mg | DELAYED_RELEASE_CAPSULE | Freq: Every day | ORAL | Status: DC

## 2012-12-20 MED ORDER — ZOLPIDEM TARTRATE 5 MG PO TABS: ORAL_TABLET | ORAL | Status: DC

## 2012-12-20 MED ORDER — METFORMIN HCL ER 500 MG PO TB24: 500.0000 mg | ORAL_TABLET | Freq: Two times a day (BID) | ORAL | Status: DC

## 2012-12-20 MED ORDER — ONDANSETRON 8 MG PO TBDP: 8.0000 mg | ORAL_TABLET | Freq: Two times a day (BID) | ORAL | Status: DC | PRN

## 2012-12-20 MED ORDER — NIFEDIPINE ER OSMOTIC RELEASE 60 MG PO TB24: 60.0000 mg | ORAL_TABLET | Freq: Every day | ORAL | Status: DC

## 2012-12-20 MED ORDER — DICLOFENAC SODIUM 75 MG PO TBEC: 75.0000 mg | DELAYED_RELEASE_TABLET | Freq: Two times a day (BID) | ORAL | Status: DC

## 2012-12-20 MED ORDER — HYDROXYZINE HCL 25 MG PO TABS: 25.0000 mg | ORAL_TABLET | ORAL | Status: DC | PRN

## 2012-12-20 NOTE — Progress Notes (Signed)
Subjective:    Patient ID: [name] [name], female    DOB: 01-14-1956, 57 y.o.   MRN: [id number]  HPI  This 57 y.o. Cauc female has HTN, Type II DM, OSA and lipid disorder as well as anxiety dis-  order; she takes multiple medications for which she needs refills sent to new mail order company.  HTN is well controlled on current medications. Pt is compliant with medications for DM and reports  no hypoglycemic episodes. She uses CPAP every night but is having DOE; this is new since  recent bout w/ bronchitis. She has a pacemaker and is scheduled to see Dr. Graciela Husbands next week.     Review of Systems  Constitutional: Negative for fever, diaphoresis, activity change, appetite change and fatigue.  HENT: Negative.   Respiratory: Positive for apnea and shortness of breath. Negative for cough, choking, chest tightness and wheezing.   Cardiovascular: Negative for chest pain, palpitations and leg swelling.  Gastrointestinal: Positive for nausea. Negative for abdominal pain.  Endocrine: Negative.   Musculoskeletal:       Nocturnal leg discomfort  Neurological: Negative.   Psychiatric/Behavioral: Positive for sleep disturbance. The patient is nervous/anxious.        Objective:   Physical Exam  Nursing note and vitals reviewed. Constitutional: She is oriented to person, place, and time. She appears well-developed and well-nourished. No distress.  Pt is obese.  HENT:  Head: Normocephalic and atraumatic.  Right Ear: Hearing, external ear and ear canal normal.  Left Ear: Hearing, external ear and ear canal normal.  Nose: Nose normal. No nasal deformity or septal deviation.  Mouth/Throat: Uvula is midline, oropharynx is clear and moist and mucous membranes are normal.  Eyes: Conjunctivae and EOM are normal. Pupils are equal, round, and reactive to light. No scleral icterus.  Neck: Normal range of motion. Neck supple. No thyromegaly present.  Cardiovascular: Normal rate, regular rhythm and normal heart  sounds.  Exam reveals no gallop and no friction rub.   No murmur heard. Pulmonary/Chest: Effort normal and breath sounds normal. No respiratory distress. She has no wheezes. She has no rales.  Musculoskeletal: Normal range of motion. She exhibits no edema and no tenderness.  Lymphadenopathy:    She has no cervical adenopathy.  Neurological: She is alert and oriented to person, place, and time. No cranial nerve deficit. She exhibits normal muscle tone. Coordination normal.  Skin: Skin is warm and dry. No pallor.  Psychiatric: She has a normal mood and affect. Her behavior is normal. Judgment and thought content normal.    Results for orders placed in visit on 12/20/12  POCT GLYCOSYLATED HEMOGLOBIN (HGB A1C)      Result Value Range   Hemoglobin A1C 5.6           Assessment & Plan:  Dyspnea- pt has appt w/ Dr. Graciela Husbands in near future; further work-up to be determined after Cardiology evaluation.  Type II or unspecified type diabetes mellitus without mention of complication, controlled - Plan: POCT glycosylated hemoglobin (Hb A1C)  Other and unspecified hyperlipidemia - Plan: Lipid panel  Sleep disturbance - Plan: zolpidem (AMBIEN) 5 MG tablet  Anxiety - Plan: citalopram (CELEXA) 20 MG tablet  NAFLD (nonalcoholic fatty liver disease) - Plan: omeprazole (PRILOSEC) 20 MG capsule, ondansetron (ZOFRAN-ODT) 8 MG disintegrating tablet, CONTINUED: omeprazole (PRILOSEC) 20 MG capsule   Meds ordered this encounter-  FAX long term RXs to [facility]  . DISCONTD: zolpidem (AMBIEN) 5 MG tablet    Sig: Take 1 tablet  by mouth at bedtime as needed for sleep.    Dispense:  30 tablet    Refill:  0                                     Pt to take to local pharmacy  . DISCONTD: omeprazole (PRILOSEC) 20 MG capsule    Sig: Take 1 capsule (20 mg total) by mouth daily.    Dispense:  30 capsule    Refill:  0                                     Pt to take to ,local pharmacy  . DISCONTD:  citalopram (CELEXA) 20 MG tablet    Sig: Take 1 tablet (20 mg total) by mouth daily.    Dispense:  30 tablet    Refill:  0                                    Pt  To take to local pharmacy  . albuterol (PROAIR HFA) 108 (90 BASE) MCG/ACT inhaler    Sig: Inhale 2 puffs into the lungs as needed.    Dispense:  2 Inhaler    Refill:  3  . beclomethasone (QVAR) 80 MCG/ACT inhaler    Sig: Inhale 1 puff into the lungs as needed.    Dispense:  3 Inhaler    Refill:  3  . benazepril (LOTENSIN) 20 MG tablet    Sig: Take 1 tablet (20 mg total) by mouth daily.    Dispense:  180 tablet    Refill:  1  . citalopram (CELEXA) 20 MG tablet    Sig: Take 1 tablet (20 mg total) by mouth daily.    Dispense:  90 tablet    Refill:  1  . diclofenac (VOLTAREN) 75 MG EC tablet    Sig: Take 1 tablet (75 mg total) by mouth 2 (two) times daily.    Dispense:  180 tablet    Refill:  1  . fluocinonide cream (LIDEX) 0.05 %    Sig: Apply topically 2 (two) times daily.    Dispense:  30 g    Refill:  3  . fluticasone (FLONASE) 50 MCG/ACT nasal spray    Sig: Place 2 sprays into the nose daily.    Dispense:  48 g    Refill:  3  . hydrOXYzine (ATARAX/VISTARIL) 25 MG tablet    Sig: Take 1 tablet (25 mg total) by mouth as needed.    Dispense:  90 tablet    Refill:  1  . metFORMIN (GLUCOPHAGE-XR) 500 MG 24 hr tablet    Sig: Take 1 tablet (500 mg total) by mouth 2 (two) times daily.    Dispense:  180 tablet    Refill:  1  . NIFEdipine (PROCARDIA XL/ADALAT-CC) 60 MG 24 hr tablet    Sig: Take 1 tablet (60 mg total) by mouth daily.    Dispense:  90 tablet    Refill:  1  . omeprazole (PRILOSEC) 20 MG capsule    Sig: Take 1 capsule (20 mg total) by mouth daily.    Dispense:  90 capsule    Refill:  1  . ondansetron (ZOFRAN-ODT) 8 MG disintegrating tablet  Sig: Take 1 tablet (8 mg total) by mouth every 12 (twelve) hours as needed for nausea.    Dispense:  30 tablet    Refill:  2  . zolpidem (AMBIEN) 5 MG tablet     Sig: Take 1 tablet by mouth at bedtime as needed for sleep.    Dispense:  90 tablet    Refill:  1

## 2012-12-20 NOTE — Patient Instructions (Addendum)
Shortness of Breath Shortness of breath means you have trouble breathing. Shortness of breath may indicate that you have a medical problem. You should seek immediate medical care for shortness of breath. CAUSES   Not enough oxygen in the air (as with high altitudes or a smoke-filled room).  Short-term (acute) lung disease, including:  Infections, such as pneumonia.  Fluid in the lungs, such as heart failure.  A blood clot in the lungs (pulmonary embolism).  Long-term (chronic) lung diseases.  Heart disease (heart attack, angina, heart failure, and others).  Low red blood cells (anemia).  Poor physical fitness. This can cause shortness of breath when you exercise.  Chest or back injuries or stiffness.  Being overweight.  Smoking.  Anxiety. This can make you feel like you are not getting enough air. DIAGNOSIS  Serious medical problems can usually be found during your physical exam. Tests may also be done to determine why you are having shortness of breath. Tests may include:  Chest X-rays.  Lung function tests.  Blood tests.  Electrocardiography.  Exercise testing.  Echocardiography.  Imaging scans. Your caregiver may not be able to find a cause for your shortness of breath after your exam. In this case, it is important to have a follow-up exam with your caregiver as directed.  TREATMENT  Treatment for shortness of breath depends on the cause of your symptoms and can vary greatly. HOME CARE INSTRUCTIONS   Do not smoke. Smoking is a common cause of shortness of breath. If you smoke, ask for help to quit.  Avoid being around chemicals or things that may bother your breathing, such as paint fumes and dust.  Rest as needed. Slowly resume your usual activities.  If medicines were prescribed, take them as directed for the full length of time directed. This includes oxygen and any inhaled medicines.  Keep all follow-up appointments as directed by your caregiver. SEEK  MEDICAL CARE IF:   Your condition does not improve in the time expected.  You have a hard time doing your normal activities even with rest.  You have any side effects or problems with the medicines prescribed.  You develop any new symptoms. SEEK IMMEDIATE MEDICAL CARE IF:   Your shortness of breath gets worse.  You feel lightheaded, faint, or develop a cough not controlled with medicines.  You start coughing up blood.  You have pain with breathing.  You have chest pain or pain in your arms, shoulders, or abdomen.  You have a fever.  You are unable to walk up stairs or exercise the way you normally do. MAKE SURE YOU:  Understand these instructions.  Will watch your condition.  Will get help right away if you are not doing well or get worse. Document Released: 06/22/2001 Document Revised: 03/28/2012 Document Reviewed: 12/13/2011 [facility] Patient Information 2013 [place], [place].    Since you are scheduled to see Dr. Graciela Husbands next week, I will not order pulmonary function tests at this time. I will wait to see what his evaluation reveals (that may be helpful as far as explanation regarding your shortness of breath).

## 2012-12-21 NOTE — Progress Notes (Signed)
Quick Note:  Please contact pt and advise that the following labs are abnormal... Lipid results compared to 1 year ago are much improved though still above normal. Your risk of heart disease is still above average ( it has come down 1 percentage point).  We can discuss any changes or additional medications at your next visit. Continue taking Fish Oil supplement.   Best treatment is healthier nutrition and staying active (20-30 minutes of exercise most days of the week).  Copy to pt. ______

## 2012-12-22 ENCOUNTER — Telehealth: Payer: Self-pay | Admitting: *Deleted

## 2012-12-22 DIAGNOSIS — I10 Essential (primary) hypertension: Secondary | ICD-10-CM

## 2012-12-22 DIAGNOSIS — R0602 Shortness of breath: Secondary | ICD-10-CM

## 2012-12-22 NOTE — Telephone Encounter (Signed)
QVAR should be 1 puff bid; rinse mouth after use. Quantity for Benazepril should be #90 (8-month supply) with 1 refill.

## 2012-12-22 NOTE — Telephone Encounter (Signed)
Can you please verify directions for [facility] for QVAR they state that its not usually dosed as PRN and also verify directions and/or quantity for benazepril (quantity/sig mismatch).

## 2012-12-24 MED ORDER — BECLOMETHASONE DIPROPIONATE 80 MCG/ACT IN AERS: 1.0000 | INHALATION_SPRAY | Freq: Two times a day (BID) | RESPIRATORY_TRACT | Status: DC

## 2012-12-24 MED ORDER — BENAZEPRIL HCL 20 MG PO TABS: 20.0000 mg | ORAL_TABLET | Freq: Every day | ORAL | Status: DC

## 2012-12-27 ENCOUNTER — Encounter: Payer: PRIVATE HEALTH INSURANCE | Admitting: Family Medicine

## 2013-01-01 ENCOUNTER — Encounter: Payer: Self-pay | Admitting: Family Medicine

## 2013-01-03 ENCOUNTER — Other Ambulatory Visit: Payer: Self-pay | Admitting: Family Medicine

## 2013-01-03 DIAGNOSIS — R0602 Shortness of breath: Secondary | ICD-10-CM

## 2013-01-03 MED ORDER — HYDROCODONE-ACETAMINOPHEN 5-500 MG PO TABS: 0.5000 | ORAL_TABLET | Freq: Once | ORAL | Status: DC

## 2013-01-03 NOTE — Progress Notes (Signed)
Hydrocodone-APAP RX has been printed and faxed to [facility] from [facility].

## 2013-01-08 ENCOUNTER — Other Ambulatory Visit: Payer: Self-pay | Admitting: Family Medicine

## 2013-01-09 ENCOUNTER — Encounter: Payer: PRIVATE HEALTH INSURANCE | Admitting: Cardiology

## 2013-01-12 ENCOUNTER — Encounter: Payer: Self-pay | Admitting: Family Medicine

## 2013-01-12 ENCOUNTER — Encounter: Payer: PRIVATE HEALTH INSURANCE | Admitting: Cardiology

## 2013-01-12 ENCOUNTER — Ambulatory Visit (INDEPENDENT_AMBULATORY_CARE_PROVIDER_SITE_OTHER): Payer: BC Managed Care – PPO | Admitting: Family Medicine

## 2013-01-12 VITALS — BP 160/72 | HR 85 | Temp 98.0°F | Resp 16 | Ht 62.5 in | Wt 220.0 lb

## 2013-01-12 DIAGNOSIS — Z0000 Encounter for general adult medical examination without abnormal findings: Secondary | ICD-10-CM

## 2013-01-12 DIAGNOSIS — M79609 Pain in unspecified limb: Secondary | ICD-10-CM

## 2013-01-12 DIAGNOSIS — L989 Disorder of the skin and subcutaneous tissue, unspecified: Secondary | ICD-10-CM

## 2013-01-12 DIAGNOSIS — Z1231 Encounter for screening mammogram for malignant neoplasm of breast: Secondary | ICD-10-CM

## 2013-01-12 DIAGNOSIS — E785 Hyperlipidemia, unspecified: Secondary | ICD-10-CM

## 2013-01-12 DIAGNOSIS — M79675 Pain in left toe(s): Secondary | ICD-10-CM

## 2013-01-12 NOTE — Progress Notes (Signed)
Subjective:    Patient ID: [name] [name], female    DOB: October 03, 1956, 57 y.o.   MRN: [id number]  HPI  This 57 y.o. Cauc female is here for CPE and medication review. She continues to be eval-  uated by Dr. Graciela Husbands (pt has a pacemaker). Pt requests MMG scheduled in [place], [place].   Review of Systems  Constitutional: Negative.   HENT: Negative.   Eyes: Negative.   Respiratory: Positive for apnea, cough, chest tightness, shortness of breath and wheezing.        Pt has Asthma  Cardiovascular: Negative.   Gastrointestinal: Negative.   Endocrine: Negative.   Genitourinary: Negative.   Musculoskeletal: Positive for myalgias and arthralgias.       Pain in 2nd toe on L foot- intermittent, no known trauma. Body aches related to Statin usage.  Skin: Negative.   Allergic/Immunologic: Negative.   Neurological: Negative.   Hematological: Negative.   All other systems reviewed and are negative.       Objective:   Physical Exam  Nursing note and vitals reviewed. Constitutional: She is oriented to person, place, and time. She appears well-developed and well-nourished. No distress.  HENT:  Head: Normocephalic and atraumatic.  Right Ear: Hearing, tympanic membrane, external ear and ear canal normal.  Left Ear: Hearing, tympanic membrane, external ear and ear canal normal.  Nose: Nose normal. No mucosal edema, rhinorrhea, nasal deformity or septal deviation. Right sinus exhibits no maxillary sinus tenderness and no frontal sinus tenderness. Left sinus exhibits no maxillary sinus tenderness and no frontal sinus tenderness.  Mouth/Throat: Uvula is midline and mucous membranes are normal. No oral lesions. No dental caries. Posterior oropharyngeal erythema present. No oropharyngeal exudate.  Eyes: Conjunctivae, EOM and lids are normal. Pupils are equal, round, and reactive to light. No scleral icterus.  Pt has routine Vision care w/ a specialist.  Neck: Normal range of motion. Neck supple. No JVD  present. No thyromegaly present.  Cardiovascular: Normal rate, regular rhythm, S1 normal, S2 normal and normal heart sounds.  Exam reveals no gallop and no friction rub.   No murmur heard. Pulses:      Radial pulses are 1+ on the right side, and 1+ on the left side.       Femoral pulses are 1+ on the right side, and 1+ on the left side.      Dorsalis pedis pulses are 1+ on the right side, and 1+ on the left side.       Posterior tibial pulses are 1+ on the right side, and 1+ on the left side.  Pulmonary/Chest: Effort normal and breath sounds normal. No respiratory distress. She has no wheezes. She has no rales.  Abdominal: Soft. She exhibits no distension, no abdominal bruit, no pulsatile midline mass and no mass. Bowel sounds are decreased. There is no hepatosplenomegaly. There is tenderness in the right upper quadrant and left lower quadrant. There is no rebound, no guarding and no CVA tenderness. No hernia.  Genitourinary: Rectum normal. Rectal exam shows no fissure, no mass, no tenderness and anal tone normal. Guaiac negative stool. No breast swelling, tenderness, discharge or bleeding. There is no rash, tenderness or lesion on the right labia. There is no rash, tenderness or lesion on the left labia.  NEFG; pelvic/PAP not performed.  Musculoskeletal: She exhibits tenderness. She exhibits no edema.       Left foot: She exhibits tenderness and bony tenderness. She exhibits no swelling, no crepitus and no deformity.  Mild stiffness in  all major joints. No effusions or erythema. L foot-2nd toe is tender proximal to 1st MTP joint (tender area on ball of foot).   Lymphadenopathy:    She has no cervical adenopathy.  Neurological: She is alert and oriented to person, place, and time. She has normal reflexes. No cranial nerve deficit. She exhibits normal muscle tone. Coordination normal.  Skin: Skin is warm and dry. No erythema. No pallor.  Lesion on chest wall has some discoloration and irreg shape.   Psychiatric: She has a normal mood and affect. Her behavior is normal. Judgment and thought content normal.    Results for orders placed in visit on 01/12/13  IFOBT (OCCULT BLOOD)      Result Value Range   IFOBT Negative          Assessment & Plan:  Routine general medical examination at a health care facility - Plan: IFOBT POC (occult bld, rslt in office)  Pain of toe of left foot - advise referral as pt has several foot issues   Plan: Ambulatory referral to Podiatry  Skin lesion of chest wall - Plan: Ambulatory referral to Dermatology  Other and unspecified hyperlipidemia  Other screening mammogram - Plan: MM Digital Screening

## 2013-01-12 NOTE — Patient Instructions (Signed)

## 2013-01-17 ENCOUNTER — Encounter: Payer: Self-pay | Admitting: Family Medicine

## 2013-01-19 ENCOUNTER — Encounter: Payer: Self-pay | Admitting: Internal Medicine

## 2013-01-19 ENCOUNTER — Encounter: Payer: Self-pay | Admitting: Cardiology

## 2013-01-19 ENCOUNTER — Ambulatory Visit (INDEPENDENT_AMBULATORY_CARE_PROVIDER_SITE_OTHER): Payer: BC Managed Care – PPO | Admitting: Cardiology

## 2013-01-19 VITALS — BP 129/80 | HR 66 | Ht 62.0 in | Wt 219.0 lb

## 2013-01-19 DIAGNOSIS — R0609 Other forms of dyspnea: Secondary | ICD-10-CM

## 2013-01-19 DIAGNOSIS — R0600 Dyspnea, unspecified: Secondary | ICD-10-CM

## 2013-01-19 DIAGNOSIS — I495 Sick sinus syndrome: Secondary | ICD-10-CM

## 2013-01-19 DIAGNOSIS — R0989 Other specified symptoms and signs involving the circulatory and respiratory systems: Secondary | ICD-10-CM

## 2013-01-19 DIAGNOSIS — Z950 Presence of cardiac pacemaker: Secondary | ICD-10-CM

## 2013-01-19 LAB — PACEMAKER DEVICE OBSERVATION
AL AMPLITUDE: 2.8 mv
AL IMPEDENCE PM: 446 Ohm
AL THRESHOLD: 0.5 V
RV LEAD IMPEDENCE PM: 504 Ohm
RV LEAD THRESHOLD: 0.5 V
VENTRICULAR PACING PM: 0.4

## 2013-01-19 NOTE — Patient Instructions (Addendum)
Your physician has requested that you have en exercise stress myoview. For further information please visit [email or web address] Please follow instruction sheet, as given.  Your physician wants you to follow-up in: 1 year with Dr. Graciela Husbands .You will receive a reminder letter in the mail two months in advance. If you don't receive a letter, please call our office to schedule the follow-up appointment.  Remote device check on 04/23/13

## 2013-01-19 NOTE — Progress Notes (Signed)
ELECTROPHYSIOLOGY OFFICE NOTE  Patient ID: [name] [name] MRN: [id number], DOB/AGE: 59-Jan-1957   Date of Visit: 01/19/2013  Primary Cardiologist: Berton Mount, MD Reason for Visit: EP/device follow-up  History of Present Illness  [name] [name] is a pleasant 57 year old woman with sinus node dysfunction s/p PPM implant 2009, HTN, DM and OSA who presents today for routine electrophysiology followup. Since last being seen in our clinic, she reports increasing DOE x 6 months, "worse than her usual asthma" and her PCP is also referring her to a pulmonologist. She reports she is "winded" after walking 30 feet or so and "some days are better than others." She denies chest pain. She denies palpitations, dizziness, near syncope or syncope. she denies LE swelling, orthopnea, PND or recent weight gain. [name] [name] reports she is compliant and tolerating medications without difficulty. She wants Dr. Graciela Husbands to know her HgbA1c is 5.6.   Past Medical History Past Medical History  Diagnosis Date  . Fatty liver 04/2011  . Hypertension   . OSA (obstructive sleep apnea)     C-Pap  . Obesity   . Asthma   . Migraine   . Anal fissure   . Pacemaker     medtronic  . Colon polyps     diverticulosis  03-2011/2005  . Diverticulosis   . GERD (gastroesophageal reflux disease)   . Hyperlipidemia   . Diabetes mellitus 2012    T2DM  . Sinoatrial node dysfunction     syncope  . Allergy   . Arthritis   . Depression   . Anxiety   . Heart murmur   . Cataract 2010     left    Past Surgical History Past Surgical History  Procedure Laterality Date  . Pacemaker placement    . Oophorectomy    . Tonsillectomy    . Eye surgery Right 2010  . Abdominal hysterectomy  2000    total     Allergies/Intolerances Allergies  Allergen Reactions  . Adhesive (Tape)   . Penicillins   . Red Yeast Rice Hives  . Sulfa Drugs Cross Reactors Hives    Current Home Medications Current Outpatient Prescriptions  Medication Sig  Dispense Refill  . albuterol (PROAIR HFA) 108 (90 BASE) MCG/ACT inhaler Inhale 2 puffs into the lungs as needed.  2 Inhaler  3  . ALPRAZolam (XANAX) 0.25 MG tablet Take 1 tablet (0.25 mg total) by mouth as needed. One every 12 hours as needed  30 tablet  0  . beclomethasone (QVAR) 80 MCG/ACT inhaler Inhale 1 puff into the lungs 2 (two) times daily. Rinse mouth after use  3 Inhaler  3  . benazepril (LOTENSIN) 20 MG tablet Take 1 tablet (20 mg total) by mouth daily.  90 tablet  1  . cetirizine (ZYRTEC) 10 MG tablet Take 10 mg by mouth daily.        . Cholecalciferol (VITAMIN D) 2000 UNITS CAPS Take by mouth.        . citalopram (CELEXA) 20 MG tablet Take 1 tablet (20 mg total) by mouth daily.  90 tablet  1  . diclofenac (VOLTAREN) 75 MG EC tablet Take 1 tablet (75 mg total) by mouth 2 (two) times daily.  180 tablet  1  . diphenhydrAMINE (BENADRYL ALLERGY) 25 mg capsule Take 25 mg by mouth every 6 (six) hours as needed.        . fish oil-omega-3 fatty acids 1000 MG capsule Take 2 g by mouth daily.        Marland Kitchen  fluocinonide cream (LIDEX) 0.05 % Apply topically 2 (two) times daily.  30 g  3  . fluticasone (FLONASE) 50 MCG/ACT nasal spray Place 2 sprays into the nose daily.  48 g  3  . hydrOXYzine (ATARAX/VISTARIL) 25 MG tablet Take 1 tablet (25 mg total) by mouth as needed.  90 tablet  1  . metFORMIN (GLUCOPHAGE-XR) 500 MG 24 hr tablet Take 1 tablet (500 mg total) by mouth 2 (two) times daily.  180 tablet  1  . Multiple Vitamin (MULTIVITAMIN) tablet Take 1 tablet by mouth daily.        Marland Kitchen NIFEdipine (PROCARDIA XL/ADALAT-CC) 60 MG 24 hr tablet Take 1 tablet (60 mg total) by mouth daily.  90 tablet  1  . omeprazole (PRILOSEC) 20 MG capsule Take 1 capsule (20 mg total) by mouth daily.  90 capsule  1  . ondansetron (ZOFRAN-ODT) 8 MG disintegrating tablet Take 1 tablet (8 mg total) by mouth every 12 (twelve) hours as needed for nausea.  30 tablet  2  . Phenylephrine HCl (VICKS SINEX NA) Place into the nose as  needed.        . triamcinolone (KENALOG) 0.1 % cream Apply 1 application topically as needed.        . zolpidem (AMBIEN) 5 MG tablet Take 1 tablet by mouth at bedtime as needed for sleep.  90 tablet  1   No current facility-administered medications for this visit.    Social History Social History  . Marital Status: Single   Occupational History  . chef    Social History Main Topics  . Smoking status: Former Smoker    Types: Cigarettes  . Smokeless tobacco: Never Used     Comment: Quit 20 yrs ago  . Alcohol Use: No  . Drug Use: No   Review of Systems General: No chills, fever, night sweats or weight changes Cardiovascular: No chest pain, edema, orthopnea, palpitations, paroxysmal nocturnal dyspnea Dermatological: No rash, lesions or masses Respiratory: No cough Urologic: No hematuria, dysuria Abdominal: No nausea, vomiting, diarrhea, bright red blood per rectum, melena, or hematemesis Neurologic: No visual changes, weakness, changes in mental status All other systems reviewed and are otherwise negative except as noted above.  Physical Exam Blood pressure 129/80, pulse 66, height 5\' 2"  (1.575 m), weight 219 lb (99.338 kg).  General: Well developed, well appearing 57 year old female in no acute distress. HEENT: Normocephalic, atraumatic. EOMs intact. Sclera nonicteric. Oropharynx clear.  Neck: Supple. No JVD. Lungs: Respirations regular and unlabored, CTA bilaterally. No wheezes, rales or rhonchi. Heart: RRR. S1, S2 present. No murmurs, rub, S3 or S4. Abdomen: Soft, non-distended.  Extremities: No clubbing, cyanosis or edema. DP/PT/Radials 2+ and equal bilaterally Psych: Normal affect. Neuro: Alert and oriented X 3. Moves all extremities spontaneously.   Diagnostics Device interrogation today - Normal device function. Thresholds, sensing, impedances consistent with previous measurements. Device programmed to maximize longevity. No AHR episodes. 2 VHR episodes, longest 7  seconds, EGMs consistent with NSVT. Device programmed at appropriate safety margins. Histogram distribution appropriate for patient activity level. Device programmed to optimize intrinsic conduction. Estimated longevity 10.5 years.   Assessment and Plan 1. DOE [name] [name] reports h/o cardiac cath in 2008 that showed "no blockages" (I do not have these records at this time) [name] [name] has multiple cardiac risk factors including HTN and DM so will perform treadmill Myoview for cardiac risk stratification 2. Sinus node dysfunction s/p PPM Normal device function No programming changes made Continue routine remote  PPM follow-up every 3 months Return to clinic for follow-up with Dr. Graciela Husbands in one year 3. HTN Normotensive today Goal BP <135/85; continue current regimen  Signed, Merideth Bosque, PA-C 01/19/2013, 9:28 AM

## 2013-01-30 ENCOUNTER — Ambulatory Visit (HOSPITAL_COMMUNITY): Payer: BC Managed Care – PPO | Attending: Cardiovascular Disease | Admitting: Radiology

## 2013-01-30 VITALS — BP 137/74 | Ht 62.0 in | Wt 216.0 lb

## 2013-01-30 DIAGNOSIS — Z87891 Personal history of nicotine dependence: Secondary | ICD-10-CM | POA: Insufficient documentation

## 2013-01-30 DIAGNOSIS — R0602 Shortness of breath: Secondary | ICD-10-CM

## 2013-01-30 DIAGNOSIS — R0989 Other specified symptoms and signs involving the circulatory and respiratory systems: Secondary | ICD-10-CM | POA: Insufficient documentation

## 2013-01-30 DIAGNOSIS — I2510 Atherosclerotic heart disease of native coronary artery without angina pectoris: Secondary | ICD-10-CM

## 2013-01-30 DIAGNOSIS — R0600 Dyspnea, unspecified: Secondary | ICD-10-CM

## 2013-01-30 DIAGNOSIS — R002 Palpitations: Secondary | ICD-10-CM | POA: Insufficient documentation

## 2013-01-30 DIAGNOSIS — J45909 Unspecified asthma, uncomplicated: Secondary | ICD-10-CM | POA: Insufficient documentation

## 2013-01-30 DIAGNOSIS — I10 Essential (primary) hypertension: Secondary | ICD-10-CM | POA: Insufficient documentation

## 2013-01-30 DIAGNOSIS — Z950 Presence of cardiac pacemaker: Secondary | ICD-10-CM | POA: Insufficient documentation

## 2013-01-30 DIAGNOSIS — R0609 Other forms of dyspnea: Secondary | ICD-10-CM | POA: Insufficient documentation

## 2013-01-30 DIAGNOSIS — R000 Tachycardia, unspecified: Secondary | ICD-10-CM | POA: Insufficient documentation

## 2013-01-30 DIAGNOSIS — I495 Sick sinus syndrome: Secondary | ICD-10-CM

## 2013-01-30 DIAGNOSIS — E785 Hyperlipidemia, unspecified: Secondary | ICD-10-CM | POA: Insufficient documentation

## 2013-01-30 MED ORDER — TECHNETIUM TC 99M SESTAMIBI GENERIC - CARDIOLITE
11.0000 | Freq: Once | INTRAVENOUS | Status: AC | PRN
  Administered 2013-01-30: 11 via INTRAVENOUS

## 2013-01-30 MED ORDER — TECHNETIUM TC 99M SESTAMIBI GENERIC - CARDIOLITE
33.0000 | Freq: Once | INTRAVENOUS | Status: AC | PRN
  Administered 2013-01-30: 33 via INTRAVENOUS

## 2013-01-30 NOTE — Progress Notes (Signed)
[facility] SITE 3 NUCLEAR MED [address] [place], [place] [address] [phone]    Cardiology Nuclear Med Study  [name] [name] is a 57 y.o. female     MRN : [id number]     DOB: November 19, 1955  Procedure Date: 01/30/2013  Nuclear Med Background Indication for Stress Test:  Evaluation for Ischemia History:  Asthma;09'Echo;EF=65%;08'Heart Catheterization Nml no records;09'Pacemaker  Cardiac Risk Factors: History of Smoking, Hypertension and Lipids  Symptoms:  DOE, Palpitations, Rapid HR and SOB   Nuclear Pre-Procedure Caffeine/Decaff Intake:  None > 12 hrs NPO After: 9:30pm   Lungs:  clear O2 Sat: 95% on room air. IV 0.9% NS with Angio Cath:  20g  IV Site: R Antecubital x 1, tolerated well IV Started by:  Irean Hong, RN  Chest Size (in):  46 Cup Size: C  Height: 5\' 2"  (1.575 m)  Weight:  216 lb (97.977 kg)  BMI:  Body mass index is 39.5 kg/(m^2). Tech Comments:  No medications today. Stress EKG's reviewed by Dr. Cassell Clement; tachycardia induced LBBB with PVC's per Dr Patty Sermons. Irean Hong, RN    Nuclear Med Study 1 or 2 day study: 1 day  Stress Test Type:  Stress  Reading MD: Kristeen Miss, MD  Order Authorizing Provider:  Sherryl Manges, MD, and Rick Duff, [facility]  Resting Radionuclide: Technetium 36m Sestamibi  Resting Radionuclide Dose: 11.0 mCi   Stress Radionuclide:  Technetium 73m Sestamibi  Stress Radionuclide Dose: 33.0 mCi           Stress Protocol Rest HR: 68 Stress HR: 151  Rest BP: 137/74 Stress BP: 205/77  Exercise Time (min): 4:08 METS: 5.80   Predicted Max HR: 162 bpm % Max HR: 93.21 bpm Rate Pressure Product: 21308   Dose of Adenosine (mg):  n/a Dose of Lexiscan: n/a mg  Dose of Atropine (mg): n/a Dose of Dobutamine: n/a mcg/kg/min (at max HR)  Stress Test Technologist: Frederick Peers, EMT-P  Nuclear Technologist:  Domenic Polite, CNMT     Rest Procedure:  Myocardial perfusion imaging was performed at rest 45 minutes following  the intravenous administration of Technetium 34m Sestamibi. Rest ECG: NSR - Normal EKG  Stress Procedure:  The patient exercised on the treadmill utilizing the Bruce Protocol for 4:08 minutes. The patient stopped due to SOB/dizziness and denied any chest pain.  Technetium 35m Sestamibi was injected at peak exercise and myocardial perfusion imaging was performed after a brief delay. Stress ECG: The patient developed a wide complex tachycardia - ? Ventricular tachycardia vs. Sinus tach with aberrant conduction.  QPS Raw Data Images:  Normal; no motion artifact; normal heart/lung ratio. Stress Images:  Normal homogeneous uptake in all areas of the myocardium. Rest Images:  Normal homogeneous uptake in all areas of the myocardium. Subtraction (SDS):  No evidence of ischemia. Transient Ischemic Dilatation (Normal <1.22):  0.99 Lung/Heart Ratio (Normal <0.45):  0.30  Quantitative Gated Spect Images QGS EDV:  105 ml QGS ESV:  28 ml  Impression Exercise Capacity:  Poor exercise capacity. BP Response:  Normal blood pressure response. Clinical Symptoms:  mild dizziness ECG Impression:  The patient developed a wide complex tachycardia - ? ventricular tachycardia vs. sinus tachycardia with aberrant conduction.   Comparison with Prior Nuclear Study: No images to compare  Overall Impression:  Low risk stress nuclear study.  There is no ischemia.  She developed a WCT - ? Rate related LBBB with exercise.  The WCT resolved with rest.  LV Ejection Fraction: 73%.  LV Wall Motion:  NL LV Function; NL Wall Motion .   Vesta Mixer, Montez Hageman., MD, [facility] 01/30/2013, 5:52 PM Office - [phone] Pager [phone]

## 2013-02-26 ENCOUNTER — Telehealth: Payer: Self-pay | Admitting: Internal Medicine

## 2013-02-26 NOTE — Telephone Encounter (Signed)
Reviewed stress test results with patient who verbalized understanding.  Patient states her breathing is not any better.  I informed patient that she has an irregular ventricular rhythm which may affect her breathing and that Dr. Graciela Husbands would like to see her for f/u.  Appointment scheduled for 5/28 at 2:00.

## 2013-02-26 NOTE — Telephone Encounter (Signed)
New Problem:    Patient called in wanting to know the results of her latest stress test. Please call back. 

## 2013-02-28 ENCOUNTER — Encounter: Payer: Self-pay | Admitting: Family Medicine

## 2013-02-28 ENCOUNTER — Ambulatory Visit (INDEPENDENT_AMBULATORY_CARE_PROVIDER_SITE_OTHER): Payer: BC Managed Care – PPO | Admitting: Family Medicine

## 2013-02-28 VITALS — BP 170/90 | HR 62 | Temp 97.9°F | Resp 16 | Ht 62.5 in | Wt 221.8 lb

## 2013-02-28 DIAGNOSIS — R0602 Shortness of breath: Secondary | ICD-10-CM

## 2013-02-28 DIAGNOSIS — E119 Type 2 diabetes mellitus without complications: Secondary | ICD-10-CM

## 2013-02-28 DIAGNOSIS — Z888 Allergy status to other drugs, medicaments and biological substances status: Secondary | ICD-10-CM

## 2013-02-28 DIAGNOSIS — Z789 Other specified health status: Secondary | ICD-10-CM

## 2013-02-28 DIAGNOSIS — J45901 Unspecified asthma with (acute) exacerbation: Secondary | ICD-10-CM

## 2013-02-28 MED ORDER — BECLOMETHASONE DIPROPIONATE 80 MCG/ACT IN AERS: INHALATION_SPRAY | RESPIRATORY_TRACT | Status: DC

## 2013-02-28 MED ORDER — GLIMEPIRIDE 4 MG PO TABS: ORAL_TABLET | ORAL | Status: DC

## 2013-02-28 NOTE — Progress Notes (Signed)
S:: This 57 y.o. Cauc female has Type II DM; she had to temporarily stop taking Metformin for Nuclear Medicine cardiac study. She felt better off this medication so she has remained off it for 2 weeks. Paresthesias, myalgias and weakness have resolved off medication. Pt would like to try a different Diabetes medication. (I reviewed the results of the Nuclear medicine study). Pt has follow-up a ppt w/ Dr. Graciela Husbands within next few weeks.  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 Neurontin 300 mg 1 tablet at bedtime has been effective. She is not taking Ambien as directed by podiatrist. Her sleep hygiene is good.  Pt has Asthma and has a chronic slightly productive cough; she uses MDIs as directed. She was using a combination inhaler but this was discontinued by another provider; she was switched to QVAR but symptoms are nor well controlled. Allergies also exacerbate bronchospasms. She has been afebrile and has no rhinorrhea or PND. She denies sinus pain or congestion or HA, dizziness or syncope.  PMHx, Soc Hx and Fam Hx reviewed.   ROS: As per HPI. Negative for abnormal weight loss, CP or tightness, palpitations, apnea, hemoptysis, GI problems, arthralgias or syncope.  O:  Filed Vitals:   02/28/13 1030  BP: 170/90  Pulse: 62  Temp: 97.9 F (36.6 C)  Resp: 16   GEN: In NAD; WN,WD. HENT: [place]/AT. EOMI w/ clear conj/sclerae. EACs/nose/oroph clear. COR: RRR. Nl S1 and S2 w/o m/g/r. LUNGS: CTA; no wheezes or rhonchi. Normal resp effort. Slightly prod ciugh. SKIN: W&D; erythema of facies. No pallor. NEURO: A&O x 3; CNs intact. Nonfocal.  A/P: Type II or unspecified type diabetes mellitus without mention of complication, not stated as uncontrolled- Trial Glimepiride 4 mg  1/2 tablet twice daily before meals. Monitor nutrition.  Medication intolerance- Discontinue Metformin.  Unspecified asthma, with exacerbation- Continue current MDIs; may consider changing to  Advair or Symbicort if cardiac evaluation is unremarkable. Consider Singulair to treat Asthma as well as AR.  DYSPNEA - Plan: beclomethasone (QVAR) 80 MCG/ACT inhaler 2 puffs bid.   Meds ordered this encounter  Medications  . ALPRAZolam (XANAX) 0.25 MG tablet    Sig: Take 0.25 mg by mouth as needed. One every 12 hours as needed  PATIENT USE PRIMEMAIL  HOME DELIVERY  . gabapentin (NEURONTIN) 300 MG capsule    Sig: Take 300 mg by mouth daily.  Marland Kitchen glimepiride (AMARYL) 4 MG tablet    Sig: Take 1/2 tablet twice a day before main meals.    Dispense:  30 tablet    Refill:  1  . beclomethasone (QVAR) 80 MCG/ACT inhaler    Sig: Inhale 2 puffs twice a day. Rinse after use.    Dispense:  3 Inhaler    Refill:  3   RX: One Touch Glucometer w/ supplies- check FSBS daily.

## 2013-02-28 NOTE — Patient Instructions (Signed)
Asthma- one medication that may be helpful for allergies and asthma is SINGULAIR (montelukast is the generic). After Dr. Graciela Husbands has completed his evaluation, I may consider prescribing this medication for you.  You have a prescription for a Glucometer- check your sugars at least once a day (fasting). Check twice a day if you do not feel well or think your sugar is low.

## 2013-03-07 ENCOUNTER — Ambulatory Visit (INDEPENDENT_AMBULATORY_CARE_PROVIDER_SITE_OTHER): Payer: BC Managed Care – PPO | Admitting: Internal Medicine

## 2013-03-07 ENCOUNTER — Encounter: Payer: Self-pay | Admitting: Internal Medicine

## 2013-03-07 VITALS — BP 146/76 | HR 84 | Ht 62.5 in

## 2013-03-07 DIAGNOSIS — I495 Sick sinus syndrome: Secondary | ICD-10-CM

## 2013-03-07 DIAGNOSIS — R0602 Shortness of breath: Secondary | ICD-10-CM

## 2013-03-07 DIAGNOSIS — I472 Ventricular tachycardia: Secondary | ICD-10-CM | POA: Insufficient documentation

## 2013-03-07 DIAGNOSIS — R55 Syncope and collapse: Secondary | ICD-10-CM

## 2013-03-07 DIAGNOSIS — I10 Essential (primary) hypertension: Secondary | ICD-10-CM

## 2013-03-07 MED ORDER — LOSARTAN POTASSIUM 50 MG PO TABS: 50.0000 mg | ORAL_TABLET | Freq: Every day | ORAL | Status: DC

## 2013-03-07 NOTE — Assessment & Plan Note (Signed)
No recurrent syncope 

## 2013-03-07 NOTE — Patient Instructions (Addendum)
Your physician has recommended you make the following change in your medication: STOP Benazepril, START Losartan 50mg  take one by mouth daily, Please contact your pharmacy to determine if Bystolic or Metoprolol Succinate will be cheaper--Contact the office with the name of medication that is cheapest, we will call in prescription and have you stop Nifedipine at that time  Your physician has recommended that you have a pulmonary function test. Pulmonary Function Tests are a group of tests that measure how well air moves in and out of your lungs.  Your physician wants you to follow-up in: 6 MONTHS with Dr Graciela Husbands.  You will receive a reminder letter in the mail two months in advance. If you don't receive a letter, please call our office to schedule the follow-up appointment.

## 2013-03-07 NOTE — Assessment & Plan Note (Addendum)
I am concerned that this may be a manifestation of ischemia. If LV function is normal no start with empiric therapy with beta blockade. We consider repeat stress testing as a target for therapy. Given her asthmatic disease, we will put her on selective drug and we will wait to hear from her is to relative cost between metoprolol and nebivolol  If it persists despite beta blockade, I would consider catheterization

## 2013-03-07 NOTE — Progress Notes (Signed)
Patient Care Team: Maurice March, MD as PCP - General (Family Medicine) Orvil Feil Himmelrich, RD as Dietitian Duke Salvia, MD as Attending Physician (Cardiology)   HPI  [name] [name] is a 57 y.o. female With a history of  Pacing for sinus node dysfunction. She paces almost never. She's had problems with dyspnea on exertion. This has been unaccompanied by edema. She underwent Myoview scanning which was negative but was notable for significant nonsustained ventricular tachycardia. Left ventricular function was normal.  She's had no chest pain.  Past Medical History  Diagnosis Date  . Fatty liver 04/2011  . Hypertension   . OSA (obstructive sleep apnea)     C-Pap  . Obesity   . Asthma   . Migraine   . Anal fissure   . Pacemaker     medtronic  . Colon polyps     diverticulosis  03-2011/2005  . Diverticulosis   . GERD (gastroesophageal reflux disease)   . Hyperlipidemia   . Diabetes mellitus 2012    T2DM  . Sinoatrial node dysfunction     syncope  . Allergy   . Arthritis   . Depression   . Anxiety   . Heart murmur   . Cataract 2010     left    Past Surgical History  Procedure Laterality Date  . Pacemaker placement    . Oophorectomy    . Tonsillectomy    . Eye surgery Right 2010  . Abdominal hysterectomy  2000    total    Current Outpatient Prescriptions  Medication Sig Dispense Refill  . albuterol (PROAIR HFA) 108 (90 BASE) MCG/ACT inhaler Inhale 2 puffs into the lungs as needed.  2 Inhaler  3  . ALPRAZolam (XANAX) 0.25 MG tablet Take 0.25 mg by mouth as needed. One every 12 hours as needed  PATIENT USE PRIMEMAIL  HOME DELIVERY      . beclomethasone (QVAR) 80 MCG/ACT inhaler Inhale 2 puffs twice a day. Rinse after use.  3 Inhaler  3  . benazepril (LOTENSIN) 20 MG tablet Take 1 tablet (20 mg total) by mouth daily.  90 tablet  1  . cetirizine (ZYRTEC) 10 MG tablet Take 10 mg by mouth daily.        . Cholecalciferol (VITAMIN D) 2000 UNITS CAPS Take by  mouth.        . citalopram (CELEXA) 20 MG tablet Take 1 tablet (20 mg total) by mouth daily.  90 tablet  1  . diclofenac (VOLTAREN) 75 MG EC tablet Take 1 tablet (75 mg total) by mouth 2 (two) times daily.  180 tablet  1  . diphenhydrAMINE (BENADRYL ALLERGY) 25 mg capsule Take 25 mg by mouth every 6 (six) hours as needed.        . fish oil-omega-3 fatty acids 1000 MG capsule Take 2 g by mouth daily. PER PATIENT TAKE 8000 MG DAILY      . fluocinonide cream (LIDEX) 0.05 % Apply topically 2 (two) times daily.  30 g  3  . fluticasone (FLONASE) 50 MCG/ACT nasal spray Place 2 sprays into the nose daily.  48 g  3  . gabapentin (NEURONTIN) 300 MG capsule Take 300 mg by mouth daily.      Marland Kitchen glimepiride (AMARYL) 4 MG tablet Take 1/2 tablet twice a day before main meals.  30 tablet  1  . hydrOXYzine (ATARAX/VISTARIL) 25 MG tablet Take 1 tablet (25 mg total) by mouth as needed.  90 tablet  1  .  Multiple Vitamin (MULTIVITAMIN) tablet Take 1 tablet by mouth daily.        Marland Kitchen NIFEdipine (PROCARDIA XL/ADALAT-CC) 60 MG 24 hr tablet Take 1 tablet (60 mg total) by mouth daily.  90 tablet  1  . omeprazole (PRILOSEC) 20 MG capsule Take 1 capsule (20 mg total) by mouth daily.  90 capsule  1  . ondansetron (ZOFRAN-ODT) 8 MG disintegrating tablet Take 1 tablet (8 mg total) by mouth every 12 (twelve) hours as needed for nausea.  30 tablet  2  . Phenylephrine HCl (VICKS SINEX NA) Place into the nose as needed.        . triamcinolone (KENALOG) 0.1 % cream Apply 1 application topically as needed.        . zolpidem (AMBIEN) 5 MG tablet Take 1 tablet by mouth at bedtime as needed for sleep.  90 tablet  1   No current facility-administered medications for this visit.    Allergies  Allergen Reactions  . Adhesive (Tape) Other (See Comments)    SKIN BLISTERS  . Penicillins   . Red Yeast Rice Hives  . Sulfa Drugs Cross Reactors Hives    Review of Systems negative except from HPI and PMH  Physical Exam BP 146/76  Pulse  84  Ht 5' 2.5" (1.588 m)  SpO2 96% Well developed and well nourished in no acute distress HENT normal E scleral and icterus clear Neck Supple JVP flat; carotids brisk and full Clear to ausculation  Regular rate and rhythm, no murmurs gallops or rub Soft with active bowel sounds No clubbing cyanosis none Edema Alert and oriented, grossly normal motor and sensory function Skin Warm and Dry    Assessment and  Plan

## 2013-03-07 NOTE — Assessment & Plan Note (Signed)
We will continue to evaluate this. We will send her for pulmonary function testing. She does have a somewhat right shifted heart rate excursion so we'll anticipate using a beta blocker. This will also help relative to the ventricular tachycardia (see below)

## 2013-03-08 ENCOUNTER — Encounter: Payer: Self-pay | Admitting: Internal Medicine

## 2013-03-09 ENCOUNTER — Other Ambulatory Visit: Payer: Self-pay | Admitting: *Deleted

## 2013-03-09 DIAGNOSIS — I495 Sick sinus syndrome: Secondary | ICD-10-CM

## 2013-03-09 DIAGNOSIS — I10 Essential (primary) hypertension: Secondary | ICD-10-CM

## 2013-03-09 MED ORDER — METOPROLOL SUCCINATE ER 50 MG PO TB24: 50.0000 mg | ORAL_TABLET | Freq: Every day | ORAL | Status: DC

## 2013-03-09 MED ORDER — LOSARTAN POTASSIUM 50 MG PO TABS: 50.0000 mg | ORAL_TABLET | Freq: Every day | ORAL | Status: DC

## 2013-04-03 ENCOUNTER — Ambulatory Visit (INDEPENDENT_AMBULATORY_CARE_PROVIDER_SITE_OTHER): Payer: BC Managed Care – PPO | Admitting: Internal Medicine

## 2013-04-03 DIAGNOSIS — I10 Essential (primary) hypertension: Secondary | ICD-10-CM

## 2013-04-03 DIAGNOSIS — R55 Syncope and collapse: Secondary | ICD-10-CM

## 2013-04-03 DIAGNOSIS — I472 Ventricular tachycardia: Secondary | ICD-10-CM

## 2013-04-03 DIAGNOSIS — R0602 Shortness of breath: Secondary | ICD-10-CM

## 2013-04-03 DIAGNOSIS — I495 Sick sinus syndrome: Secondary | ICD-10-CM

## 2013-04-03 LAB — PULMONARY FUNCTION TEST

## 2013-04-03 NOTE — Progress Notes (Signed)
PFT done. Jennifer Castillo, CMA  

## 2013-04-11 ENCOUNTER — Ambulatory Visit: Payer: BC Managed Care – PPO | Admitting: Family Medicine

## 2013-04-18 ENCOUNTER — Ambulatory Visit (INDEPENDENT_AMBULATORY_CARE_PROVIDER_SITE_OTHER): Payer: BC Managed Care – PPO | Admitting: Family Medicine

## 2013-04-18 ENCOUNTER — Telehealth: Payer: Self-pay | Admitting: Internal Medicine

## 2013-04-18 ENCOUNTER — Encounter: Payer: Self-pay | Admitting: Family Medicine

## 2013-04-18 VITALS — BP 168/92 | HR 71 | Temp 98.2°F | Resp 16 | Ht 62.5 in | Wt 226.2 lb

## 2013-04-18 DIAGNOSIS — K760 Fatty (change of) liver, not elsewhere classified: Secondary | ICD-10-CM

## 2013-04-18 DIAGNOSIS — F419 Anxiety disorder, unspecified: Secondary | ICD-10-CM

## 2013-04-18 DIAGNOSIS — E785 Hyperlipidemia, unspecified: Secondary | ICD-10-CM

## 2013-04-18 DIAGNOSIS — M1990 Unspecified osteoarthritis, unspecified site: Secondary | ICD-10-CM

## 2013-04-18 DIAGNOSIS — F411 Generalized anxiety disorder: Secondary | ICD-10-CM

## 2013-04-18 DIAGNOSIS — Z9109 Other allergy status, other than to drugs and biological substances: Secondary | ICD-10-CM

## 2013-04-18 DIAGNOSIS — E119 Type 2 diabetes mellitus without complications: Secondary | ICD-10-CM

## 2013-04-18 DIAGNOSIS — R51 Headache: Secondary | ICD-10-CM

## 2013-04-18 LAB — COMPREHENSIVE METABOLIC PANEL
Albumin: 4.7 g/dL (ref 3.5–5.2)
Alkaline Phosphatase: 82 U/L (ref 39–117)
BUN: 22 mg/dL (ref 6–23)
CO2: 24 mEq/L (ref 19–32)
Calcium: 9.8 mg/dL (ref 8.4–10.5)
Chloride: 104 mEq/L (ref 96–112)
Glucose, Bld: 108 mg/dL — ABNORMAL HIGH (ref 70–99)
Potassium: 4.5 mEq/L (ref 3.5–5.3)
Sodium: 136 mEq/L (ref 135–145)
Total Protein: 6.9 g/dL (ref 6.0–8.3)

## 2013-04-18 LAB — LDL CHOLESTEROL, DIRECT: Direct LDL: 195 mg/dL — ABNORMAL HIGH

## 2013-04-18 MED ORDER — DICLOFENAC SODIUM 75 MG PO TBEC: 75.0000 mg | DELAYED_RELEASE_TABLET | Freq: Two times a day (BID) | ORAL | Status: DC

## 2013-04-18 MED ORDER — CITALOPRAM HYDROBROMIDE 20 MG PO TABS: 20.0000 mg | ORAL_TABLET | Freq: Every day | ORAL | Status: DC

## 2013-04-18 MED ORDER — GABAPENTIN 300 MG PO CAPS: ORAL_CAPSULE | ORAL | Status: DC

## 2013-04-18 MED ORDER — OMEPRAZOLE 20 MG PO CPDR: 20.0000 mg | DELAYED_RELEASE_CAPSULE | Freq: Every day | ORAL | Status: DC

## 2013-04-18 MED ORDER — BUTALBITAL-APAP-CAFFEINE 50-325-40 MG PO TABS: 1.0000 | ORAL_TABLET | Freq: Two times a day (BID) | ORAL | Status: DC | PRN

## 2013-04-18 MED ORDER — GLIMEPIRIDE 4 MG PO TABS: ORAL_TABLET | ORAL | Status: DC

## 2013-04-18 NOTE — Patient Instructions (Addendum)
Diabetes is still under good control.  Exercise to Stay Healthy Exercise helps you become and stay healthy. EXERCISE IDEAS AND TIPS Choose exercises that:  You enjoy.  Fit into your day. You do not need to exercise really hard to be healthy. You can do exercises at a slow or medium level and stay healthy. You can:  Stretch before and after working out.  Try yoga, Pilates, or tai chi.  Lift weights.  Walk fast, swim, jog, run, climb stairs, bicycle, dance, or rollerskate.  Take aerobic classes. Exercises that burn about 150 calories:  Running 1  miles in 15 minutes.  Playing volleyball for 45 to 60 minutes.  Washing and waxing a car for 45 to 60 minutes.  Playing touch football for 45 minutes.  Walking 1  miles in 35 minutes.  Pushing a stroller 1  miles in 30 minutes.  Playing basketball for 30 minutes.  Raking leaves for 30 minutes.  Bicycling 5 miles in 30 minutes.  Walking 2 miles in 30 minutes.  Dancing for 30 minutes.  Shoveling snow for 15 minutes.  Swimming laps for 20 minutes.  Walking up stairs for 15 minutes.  Bicycling 4 miles in 15 minutes.  Gardening for 30 to 45 minutes.  Jumping rope for 15 minutes.  Washing windows or floors for 45 to 60 minutes. Document Released: 10/30/2010 Document Revised: 12/20/2011 Document Reviewed: 10/30/2010 [facility] Patient Information 2014 [place], [place].

## 2013-04-18 NOTE — Telephone Encounter (Signed)
New Problem:    Patient called in wanting to know the results of her PFT.  Please call back.

## 2013-04-18 NOTE — Progress Notes (Signed)
S:  This pt is here for medication refills for several chronic medications. She is being evaluated by Cardiology and Pulmonary specialists; medication adjustments have been made and pt returns for follow-up and further testing in the near future.    Pt has hx of chronic HA disorder; she reports "migraines" in younger years. Treated with Imitrex but this is not an option now due to BP/cardiac problems. She requests a medication that she can use 1-2x/month for infrequent HAs.   Diabetes- pt is compliant with medication though she did miss a dose of Metformin last night. Not able to check FSBS lately- out of glucometer strips. Reports no symptoms c/w hypoglycemia.   HTN- BP better controlled (146/76 in late May at specialty care office visit); medication change- cough resolved when Benazepril was discontinued. Pt now on ARB (Cozaar).    PMHx, Soc Hx and Problem List reviewed.   ROS: As per HPI; otherwise negative for HA-associated n/v, diaphoresis, dizziness, vision impairment, tremor, slurred speech or numbness.   O:  Filed Vitals:   04/18/13 0906  BP: 168/92  Pulse: 71  Temp: 98.2 F (36.8 C)  Resp: 16   GEN: In NAD; WN,WD.  Weight is up 6 lbs in 3 months. HENT: [place]/AT; EOMI w/ clear conj/sclerae. Otherwise unremarkable. COR: RRR. LUNGS: Normal resp rate and effort. SKIN: W&D; no erythema or pallor. NEURO: A&O x 3; CNs intact. Nonfocal.  Results for orders placed in visit on 04/18/13  POCT GLYCOSYLATED HEMOGLOBIN (HGB A1C)      Result Value Range   Hemoglobin A1C 6.2      A/P: Type II or unspecified type diabetes mellitus without mention of complication, not stated as uncontrolled - Plan: POCT glycosylated hemoglobin (Hb A1C), Comprehensive metabolic panel  Headache(784.0)  Anxiety - Plan: citalopram (CELEXA) 20 MG tablet  Other and unspecified hyperlipidemia - Plan: LDL Cholesterol, Direct  Osteoarthritis - Plan: diclofenac (VOLTAREN) 75 MG EC tablet  Environmental  allergies  NAFLD (nonalcoholic fatty liver disease) - Plan: omeprazole (PRILOSEC) 20 MG capsule  Meds ordered this encounter  Medications  . butalbital-acetaminophen-caffeine (FIORICET, ESGIC) 50-325-40 MG per tablet    Sig: Take 1 tablet by mouth 2 (two) times daily as needed for headache.    Dispense:  30 tablet    Refill:  0  . citalopram (CELEXA) 20 MG tablet    Sig: Take 1 tablet (20 mg total) by mouth daily.    Dispense:  90 tablet    Refill:  3  . diclofenac (VOLTAREN) 75 MG EC tablet    Sig: Take 1 tablet (75 mg total) by mouth 2 (two) times daily.    Dispense:  180 tablet    Refill:  3  . gabapentin (NEURONTIN) 300 MG capsule    Sig: Take 1 capsule in AM and 2 caps at bedtime.    Dispense:  270 capsule    Refill:  3  . glimepiride (AMARYL) 4 MG tablet    Sig: Take 1/2 tablet twice a day before main meals.    Dispense:  90 tablet    Refill:  3  . omeprazole (PRILOSEC) 20 MG capsule    Sig: Take 1 capsule (20 mg total) by mouth daily.    Dispense:  90 capsule    Refill:  3

## 2013-04-18 NOTE — Telephone Encounter (Signed)
Left message of results on voicemail - also left message that Dr Graciela Husbands has not reviewed at this point and she will be called back with any changes or further recommendations.  Requested she call back with any questions

## 2013-04-19 ENCOUNTER — Encounter: Payer: Self-pay | Admitting: Family Medicine

## 2013-04-23 ENCOUNTER — Encounter: Payer: Self-pay | Admitting: Internal Medicine

## 2013-04-23 ENCOUNTER — Ambulatory Visit (INDEPENDENT_AMBULATORY_CARE_PROVIDER_SITE_OTHER): Payer: BC Managed Care – PPO | Admitting: *Deleted

## 2013-04-23 DIAGNOSIS — I495 Sick sinus syndrome: Secondary | ICD-10-CM

## 2013-04-23 DIAGNOSIS — Z950 Presence of cardiac pacemaker: Secondary | ICD-10-CM

## 2013-04-30 LAB — REMOTE PACEMAKER DEVICE
AL IMPEDENCE PM: 440 Ohm
BATTERY VOLTAGE: 2.78 V
RV LEAD AMPLITUDE: 16 mv
RV LEAD IMPEDENCE PM: 494 Ohm

## 2013-05-08 ENCOUNTER — Encounter: Payer: Self-pay | Admitting: *Deleted

## 2013-06-29 ENCOUNTER — Encounter: Payer: Self-pay | Admitting: Family Medicine

## 2013-06-29 NOTE — Telephone Encounter (Signed)
Alprazolam 0.25 mg #30 w/ no refills phoned to [facility] on [address]. Message sent to pt via MyChart.

## 2013-08-06 ENCOUNTER — Ambulatory Visit: Payer: BC Managed Care – PPO

## 2013-08-06 ENCOUNTER — Ambulatory Visit (INDEPENDENT_AMBULATORY_CARE_PROVIDER_SITE_OTHER): Payer: BC Managed Care – PPO | Admitting: Family Medicine

## 2013-08-06 VITALS — BP 170/100 | HR 72 | Temp 98.4°F | Resp 18 | Ht 63.0 in | Wt 227.0 lb

## 2013-08-06 DIAGNOSIS — R059 Cough, unspecified: Secondary | ICD-10-CM

## 2013-08-06 DIAGNOSIS — R062 Wheezing: Secondary | ICD-10-CM

## 2013-08-06 DIAGNOSIS — E119 Type 2 diabetes mellitus without complications: Secondary | ICD-10-CM

## 2013-08-06 DIAGNOSIS — J209 Acute bronchitis, unspecified: Secondary | ICD-10-CM

## 2013-08-06 DIAGNOSIS — R05 Cough: Secondary | ICD-10-CM

## 2013-08-06 DIAGNOSIS — G4733 Obstructive sleep apnea (adult) (pediatric): Secondary | ICD-10-CM

## 2013-08-06 MED ORDER — HYDROCODONE-HOMATROPINE 5-1.5 MG/5ML PO SYRP: 5.0000 mL | ORAL_SOLUTION | ORAL | Status: DC | PRN

## 2013-08-06 MED ORDER — AZITHROMYCIN 250 MG PO TABS: ORAL_TABLET | ORAL | Status: DC

## 2013-08-06 NOTE — Patient Instructions (Signed)
Drink plenty of fluids  Get sufficient rest  Take the antibiotics 2 initially, then one daily for 4 days. If not improving by the end of that, or if getting more wheezy at anytime, call back and I will put you on a course of prednisone also.   If getting significantly worse return for recheck

## 2013-08-06 NOTE — Progress Notes (Signed)
Subjective:  57 year old lady with history of about 12 weeks of coughing. It waxes and wanes, bothering her enough now that she came on in. She's been coughing up green phlegm. She does not smoke. She does not have any fever. She has continued to work. It is been several years since she's had to have much medications for this she thinks. She does get it periodically.  Objective: TMs are normal. Throat clear. Neck supple without significant nodes. Chest has a few rhonchi in the left lower lung. She is not wheezing although she says she has. No ankle edema. Alert and oriented.  [facility] reading (PRIMARY) by  Dr. Alwyn Ren No acute infections noted. Pacemaker is noted.  Assessment: Bronchitis History of diabetes Pacemaker.  Plan: Will treat with antibiotics and cough medication.  She is to call me back if she is not improving over the next for 5 days, and I would probably then and a tapered dose of prednisone. However since she is diabetic, although well controlled, I would prefer not to use steroids if not necessary.

## 2013-08-16 ENCOUNTER — Other Ambulatory Visit: Payer: Self-pay

## 2013-08-22 ENCOUNTER — Ambulatory Visit (INDEPENDENT_AMBULATORY_CARE_PROVIDER_SITE_OTHER): Payer: BC Managed Care – PPO | Admitting: Family Medicine

## 2013-08-22 ENCOUNTER — Encounter: Payer: Self-pay | Admitting: Family Medicine

## 2013-08-22 VITALS — BP 158/110 | HR 64 | Temp 98.4°F | Resp 16 | Ht 63.0 in | Wt 227.6 lb

## 2013-08-22 DIAGNOSIS — E119 Type 2 diabetes mellitus without complications: Secondary | ICD-10-CM

## 2013-08-22 DIAGNOSIS — J45909 Unspecified asthma, uncomplicated: Secondary | ICD-10-CM

## 2013-08-22 DIAGNOSIS — I10 Essential (primary) hypertension: Secondary | ICD-10-CM

## 2013-08-22 LAB — POCT GLYCOSYLATED HEMOGLOBIN (HGB A1C): Hemoglobin A1C: 6.7

## 2013-08-22 MED ORDER — ALPRAZOLAM 0.25 MG PO TABS: ORAL_TABLET | ORAL | Status: DC

## 2013-08-22 MED ORDER — LOSARTAN POTASSIUM 100 MG PO TABS: 100.0000 mg | ORAL_TABLET | Freq: Every day | ORAL | Status: DC

## 2013-08-22 MED ORDER — DOXYCYCLINE HYCLATE 50 MG PO CAPS: 50.0000 mg | ORAL_CAPSULE | Freq: Two times a day (BID) | ORAL | Status: DC

## 2013-08-22 NOTE — Patient Instructions (Signed)
Losartan medication has been increased to 100 mg daily. Please leyt [place] know what we need to do about your medication refills once you contact [facility]. Antibiotic for bronchitis- Doxycycline 100 mg 1 twice a day for 10 days. You can return for Flu vaccine after finishing the antibiotic and you feel that you are back at baseline as far as respiratory status.  Contact us if you have any new problems or concerns.  Enjoy the Holidays!!!

## 2013-08-22 NOTE — Progress Notes (Signed)
Patient ID: [name] [name] MRN: [id number], DOB: Mar 23, 1956, 57 y.o. Date of Encounter: 08/22/2013,   Primary Physician: Dow Adolph, MD  Chief Complaint: Diabetes follow up  HPI: 57 y.o. year old female with history below presents for follow up of diabetes mellitus. Doing well. No issues or complaints. Taking medications daily without adverse effects. No polydipsia, polyphagia, polyuria, or nocturia.  Blood sugars at home: 100-130. Exercising- not regularly. Last A1C: 6.2%  Bronchitis was treated last month; pt finished Z-PAK and felt well for a few days but now has prod cough (green sputum) and SOB. She denies fever/chills, HA or sinus pain/pressure, n/v, poor appetite, CP w/ cough or hemoptysis. MDIs use is excellent' QVAR works well for pt.  HTN- pt is complaint w/ medications (losartan and metoprolol). She is asymptomatic. Pt has follow-up w/ Dr. Graciela Husbands next week; she will discuss some medication concerns with him but she reports that he wants all chronic med refills to be addressed through our office. Pt states she needs refills on all chronic medis; our profile shows that all meds were refilled with 90-day supply and 3 refills. Pt will contact [facility] and clarify status of refills.   Past Medical History  Diagnosis Date  . Fatty liver 04/2011  . Hypertension   . OSA (obstructive sleep apnea)     C-Pap  . Obesity   . Asthma   . Migraine   . Anal fissure   . Pacemaker     medtronic  . Colon polyps     diverticulosis  03-2011/2005  . Diverticulosis   . GERD (gastroesophageal reflux disease)   . Hyperlipidemia   . Diabetes mellitus 2012    T2DM  . Sinoatrial node dysfunction     syncope  . Allergy   . Arthritis   . Depression   . Anxiety   . Heart murmur   . Cataract 2010     left     Home Meds: Prior to Admission medications   Medication Sig Start Date End Date Taking? Authorizing Provider  albuterol (PROAIR HFA) 108 (90 BASE) MCG/ACT inhaler Inhale 2 puffs  into the lungs as needed. 12/20/12  Yes Maurice March, MD  beclomethasone (QVAR) 80 MCG/ACT inhaler Inhale 2 puffs twice a day. Rinse after use. 02/28/13  Yes Maurice March, MD  butalbital-acetaminophen-caffeine (FIORICET, [facility]) [phone] MG per tablet Take 1 tablet by mouth 2 (two) times daily as needed for headache. 04/18/13  Yes Maurice March, MD  cetirizine (ZYRTEC) 10 MG tablet Take 10 mg by mouth daily.     Yes Historical Provider, MD  Cholecalciferol (VITAMIN D) 2000 UNITS CAPS Take by mouth.     Yes Historical Provider, MD  citalopram (CELEXA) 20 MG tablet Take 1 tablet (20 mg total) by mouth daily. 04/18/13  Yes Maurice March, MD  diclofenac (VOLTAREN) 75 MG EC tablet Take 1 tablet (75 mg total) by mouth 2 (two) times daily. 04/18/13  Yes Maurice March, MD  fish oil-omega-3 fatty acids 1000 MG capsule Take 2 g by mouth daily. PER PATIENT TAKE 8000 MG DAILY   Yes Historical Provider, MD  fluticasone (FLONASE) 50 MCG/ACT nasal spray Place 2 sprays into the nose daily. 12/20/12 12/20/13 Yes Maurice March, MD  gabapentin (NEURONTIN) 300 MG capsule Take 1 capsule in AM and 2 caps at bedtime. 04/18/13  Yes Maurice March, MD  glimepiride (AMARYL) 4 MG tablet Take 1/2 tablet twice a day before main meals. 04/18/13  Yes Maurice March,  MD  HYDROcodone-homatropine (HYCODAN) 5-1.5 MG/5ML syrup Take 5 mLs by mouth every 4 (four) hours as needed for cough. 08/06/13  Yes Peyton Najjar, MD  metoprolol succinate (TOPROL-XL) 50 MG 24 hr tablet Take 1 tablet (50 mg total) by mouth daily. Take with or immediately following a meal. 03/09/13  Yes Duke Salvia, MD  Multiple Vitamin (MULTIVITAMIN) tablet Take 1 tablet by mouth daily.     Yes Historical Provider, MD  omeprazole (PRILOSEC) 20 MG capsule Take 1 capsule (20 mg total) by mouth daily. 04/18/13  Yes Maurice March, MD  ondansetron (ZOFRAN-ODT) 8 MG disintegrating tablet Take 1 tablet (8 mg total) by mouth every 12  (twelve) hours as needed for nausea. 12/20/12  Yes Maurice March, MD  triamcinolone (KENALOG) 0.1 % cream Apply 1 application topically as needed.     Yes Historical Provider, MD  ALPRAZolam Prudy Feeler) 0.25 MG tablet Take 1 tablet once a day prn anxiety. 08/22/13   Maurice March, MD  doxycycline (VIBRAMYCIN) 50 MG capsule Take 1 capsule (50 mg total) by mouth 2 (two) times daily. 08/22/13   Maurice March, MD  losartan (COZAAR) 100 MG tablet Take 1 tablet (100 mg total) by mouth daily. 08/22/13   Maurice March, MD    Allergies:  Allergies  Allergen Reactions  . Adhesive [Tape] Other (See Comments)    SKIN BLISTERS  . Penicillins   . Red Yeast Rice Hives  . Sulfa Drugs Cross Reactors Hives    Occupational History  . chef    Social History Main Topics  . Smoking status: Former Smoker    Types: Cigarettes  . Smokeless tobacco: Never Used     Comment: Quit 20 yrs ago  . Alcohol Use: No  . Drug Use: No  . Sexual Activity: Not on file    Review of Systems: Constitutional: negative for chills, fever, night sweats, weight changes, or fatigue  HEENT: negative for vision changes, hearing loss, congestion, rhinorrhea, or epistaxis Cardiovascular: negative for chest pain, palpitations, diaphoresis, DOE, orthopnea, or edema. Abdominal: negative for abdominal pain, nausea, vomiting, diarrhea, or constipation Dermatological: negative for rash, erythema, or wounds Neurologic: negative for headache, dizziness, or syncope Renal:  Negative for polyuria, polydipsia, or dysuria All other systems reviewed and are otherwise negative with the exception to those above and in the HPI.   Physical Exam: Filed Vitals:   08/22/13 0759  BP: 158/110  Pulse: 64  Temp: 98.4 F (36.9 C)  Resp: 16   General: Well developed, well nourished, in no acute distress. Head: Normocephalic, atraumatic, eyes without discharge, sclera non-icteric, nares are without discharge. Bilateral  auditory canals clear, TM's are without perforation or erythema. Oral cavity moist, posterior pharynx mildly erythematous without exudate, peritonsillar abscess, or post nasal drip.  Neck: Supple. No thyromegaly. Full ROM. No lymphadenopathy. Lungs: Clear bilaterally to auscultation without wheezes, rales, or rhonchi. Breathing is unlabored. Heart: RRR with S1 S2. No murmurs, rubs, or gallops appreciated. Distal pulses 2+. Msk:  Strength and tone normal for age. Extremities/Skin: Warm and dry. No clubbing or cyanosis. No edema. No rashes, wounds, or suspicious lesions. Monofilament exam - see DM foot exam..  Neuro: Alert and oriented X 3. Moves all extremities spontaneously. Gait is normal. CNII-XII grossly in tact. Psych:  Responds to questions appropriately with a normal affect.   Results for orders placed in visit on 08/22/13  POCT GLYCOSYLATED HEMOGLOBIN (HGB A1C)      Result Value Range  Hemoglobin A1C 6.7      ASSESSMENT AND PLAN:  Type II or unspecified type diabetes mellitus without mention of complication, not stated as uncontrolled - Practice healthy nutrition and increase physical activity for weight reduction and better glycemic control.  Plan: HM Diabetes Foot Exam, POCT glycosylated hemoglobin (Hb A1C)  HTN (hypertension)- Increase Losartan to 100 mg 1 tablet daily.  Asthmatic bronchitis- Continue MDI use. RX: Doxycycline 100 mg 1 capsule twice a day.  Pt to contact [facility] and check on status of refills. She will return for Flu vaccine when respiratory symptoms have cleared.

## 2013-08-23 ENCOUNTER — Encounter: Payer: Self-pay | Admitting: *Deleted

## 2013-08-27 ENCOUNTER — Ambulatory Visit (INDEPENDENT_AMBULATORY_CARE_PROVIDER_SITE_OTHER): Payer: BC Managed Care – PPO | Admitting: Family Medicine

## 2013-08-27 VITALS — BP 176/108 | HR 76 | Temp 98.9°F | Resp 16 | Ht 63.0 in | Wt 231.0 lb

## 2013-08-27 DIAGNOSIS — S96811S Strain of other specified muscles and tendons at ankle and foot level, right foot, sequela: Secondary | ICD-10-CM

## 2013-08-27 DIAGNOSIS — IMO0002 Reserved for concepts with insufficient information to code with codable children: Secondary | ICD-10-CM

## 2013-08-27 NOTE — Progress Notes (Signed)
57 year old woman who works for [employer]. She was bathing her 45 and [age] year-old granddaughter last night when she experienced sudden sharp pain in her right calf. Since that time, patient has noted pain in the calf only when she gets up on her toes. She says her pain is 0/10 when she's sitting at rest and she can walk without pain.  Objective: No acute distress. Blood pressure recheck is 160/80 in Gait is stable Examination of right calf reveals some deep pain with palpation, and both calves measured 16 inches in circumference. Patient does have full range of motion of her ankle, knee, and leg. 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  Signed, Sheila Oats.D.

## 2013-08-31 ENCOUNTER — Encounter: Payer: BC Managed Care – PPO | Admitting: Internal Medicine

## 2013-09-19 ENCOUNTER — Ambulatory Visit (INDEPENDENT_AMBULATORY_CARE_PROVIDER_SITE_OTHER): Payer: BC Managed Care – PPO | Admitting: Internal Medicine

## 2013-09-19 ENCOUNTER — Encounter: Payer: Self-pay | Admitting: Internal Medicine

## 2013-09-19 VITALS — BP 204/115 | HR 69 | Ht 62.0 in | Wt 231.8 lb

## 2013-09-19 DIAGNOSIS — I495 Sick sinus syndrome: Secondary | ICD-10-CM

## 2013-09-19 DIAGNOSIS — I5032 Chronic diastolic (congestive) heart failure: Secondary | ICD-10-CM

## 2013-09-19 DIAGNOSIS — Z45018 Encounter for adjustment and management of other part of cardiac pacemaker: Secondary | ICD-10-CM | POA: Insufficient documentation

## 2013-09-19 DIAGNOSIS — Z950 Presence of cardiac pacemaker: Secondary | ICD-10-CM

## 2013-09-19 DIAGNOSIS — I10 Essential (primary) hypertension: Secondary | ICD-10-CM

## 2013-09-19 DIAGNOSIS — R0609 Other forms of dyspnea: Secondary | ICD-10-CM

## 2013-09-19 DIAGNOSIS — R0600 Dyspnea, unspecified: Secondary | ICD-10-CM

## 2013-09-19 DIAGNOSIS — R55 Syncope and collapse: Secondary | ICD-10-CM

## 2013-09-19 DIAGNOSIS — I472 Ventricular tachycardia: Secondary | ICD-10-CM

## 2013-09-19 LAB — MDC_IDC_ENUM_SESS_TYPE_INCLINIC
Battery Impedance: 203 Ohm
Battery Voltage: 2.79 V
Brady Statistic AP VP Percent: 0 %
Brady Statistic AP VS Percent: 0 %
Brady Statistic AS VP Percent: 0 %
Brady Statistic AS VS Percent: 100 %
Date Time Interrogation Session: 20141210122429
Lead Channel Impedance Value: 454 Ohm
Lead Channel Impedance Value: 527 Ohm
Lead Channel Pacing Threshold Pulse Width: 0.4 ms
Lead Channel Pacing Threshold Pulse Width: 0.4 ms
Lead Channel Sensing Intrinsic Amplitude: 11.2 mV
Lead Channel Sensing Intrinsic Amplitude: 2 mV
Lead Channel Setting Pacing Amplitude: 2 V
Lead Channel Setting Sensing Sensitivity: 4 mV

## 2013-09-19 MED ORDER — BENAZEPRIL-HYDROCHLOROTHIAZIDE 20-12.5 MG PO TABS: 1.0000 | ORAL_TABLET | Freq: Two times a day (BID) | ORAL | Status: DC

## 2013-09-19 MED ORDER — METOPROLOL SUCCINATE ER 50 MG PO TB24: 100.0000 mg | ORAL_TABLET | Freq: Every day | ORAL | Status: DC

## 2013-09-19 NOTE — Assessment & Plan Note (Signed)
currnetly with HA in the context of her bloodpressure elevation

## 2013-09-19 NOTE — Patient Instructions (Addendum)
Your physician has recommended you make the following change in your medication:  1) Stop Benazepril. 2) Start Lotensin HCT twice daily. 3) Increase Toprol to 100 mg daily.  Your physician recommends that you schedule a follow-up appointment in: one week with your PCP.   Remote monitoring is used to monitor your Pacemaker of ICD from home. This monitoring reduces the number of office visits required to check your device to one time per year. It allows [place] to keep an eye on the functioning of your device to ensure it is working properly. You are scheduled for a device check from home on 12/21/2013. You may send your transmission at any time that day. If you have a wireless device, the transmission will be sent automatically. After your physician reviews your transmission, you will receive a postcard with your next transmission date.  Your physician wants you to follow-up in: one year with Dr. Graciela Husbands.  You will receive a reminder letter in the mail two months in advance. If you don't receive a letter, please call our office to schedule the follow-up appointment.

## 2013-09-19 NOTE — Assessment & Plan Note (Signed)
euvolimic need to control blood pressure

## 2013-09-19 NOTE — Assessment & Plan Note (Signed)
The patient's device was interrogated.  The information was reviewed. No changes were made in the programming.    

## 2013-09-19 NOTE — Assessment & Plan Note (Signed)
No syncope.   

## 2013-09-19 NOTE — Progress Notes (Signed)
Patient Care Team: Maurice March, MD as PCP - General (Family Medicine) Orvil Feil Himmelrich, RD as Dietitian Duke Salvia, MD as Attending Physician (Cardiology)   HPI  [name] [name] is a 57 y.o. female With a history of Pacing for sinus node dysfunction. She paces almost never. She's had problems with dyspnea on exertion. This has been unaccompanied by edema. She underwent Myoview scanning which was negative but was notable for significant nonsustained ventricular tachycardia. Left ventricular function was normal.   Past Medical History  Diagnosis Date  . Fatty liver 04/2011  . Hypertension   . OSA (obstructive sleep apnea)     C-Pap  . Obesity   . Asthma   . Migraine   . Anal fissure   . Pacemaker     medtronic  . Colon polyps     diverticulosis  03-2011/2005  . Diverticulosis   . GERD (gastroesophageal reflux disease)   . Hyperlipidemia   . Diabetes mellitus 2012    T2DM  . Sinoatrial node dysfunction     syncope  . Allergy   . Arthritis   . Depression   . Anxiety   . Heart murmur   . Cataract 2010     left    Past Surgical History  Procedure Laterality Date  . Pacemaker placement    . Oophorectomy    . Tonsillectomy    . Eye surgery Right 2010  . Abdominal hysterectomy  2000    total    Current Outpatient Prescriptions  Medication Sig Dispense Refill  . albuterol (PROAIR HFA) 108 (90 BASE) MCG/ACT inhaler Inhale 2 puffs into the lungs as needed.  2 Inhaler  3  . ALPRAZolam (XANAX) 0.25 MG tablet Take 1 tablet once a day prn anxiety.  30 tablet  0  . beclomethasone (QVAR) 80 MCG/ACT inhaler Inhale 2 puffs twice a day. Rinse after use.  3 Inhaler  3  . butalbital-acetaminophen-caffeine (FIORICET, ESGIC) 50-325-40 MG per tablet Take 1 tablet by mouth 2 (two) times daily as needed for headache.  30 tablet  0  . cetirizine (ZYRTEC) 10 MG tablet Take 10 mg by mouth daily.        . Cholecalciferol (VITAMIN D) 2000 UNITS CAPS Take by mouth.         . citalopram (CELEXA) 20 MG tablet Take 1 tablet (20 mg total) by mouth daily.  90 tablet  3  . diclofenac (VOLTAREN) 75 MG EC tablet Take 1 tablet (75 mg total) by mouth 2 (two) times daily.  180 tablet  3  . doxycycline (VIBRAMYCIN) 50 MG capsule Take 1 capsule (50 mg total) by mouth 2 (two) times daily.  20 capsule  0  . fish oil-omega-3 fatty acids 1000 MG capsule Take 2 g by mouth daily. PER PATIENT TAKE 8000 MG DAILY      . fluticasone (FLONASE) 50 MCG/ACT nasal spray Place 2 sprays into the nose daily.  48 g  3  . gabapentin (NEURONTIN) 300 MG capsule Take 1 capsule in AM and 2 caps at bedtime.  270 capsule  3  . glimepiride (AMARYL) 4 MG tablet Take 1/2 tablet twice a day before main meals.  90 tablet  3  . HYDROcodone-homatropine (HYCODAN) 5-1.5 MG/5ML syrup Take 5 mLs by mouth every 4 (four) hours as needed for cough.  120 mL  0  . losartan (COZAAR) 100 MG tablet Take 1 tablet (100 mg total) by mouth daily.  90 tablet  3  . metoprolol succinate (TOPROL-XL) 50 MG 24 hr tablet Take 1 tablet (50 mg total) by mouth daily. Take with or immediately following a meal.  90 tablet  3  . Multiple Vitamin (MULTIVITAMIN) tablet Take 1 tablet by mouth daily.        Marland Kitchen omeprazole (PRILOSEC) 20 MG capsule Take 1 capsule (20 mg total) by mouth daily.  90 capsule  3  . ondansetron (ZOFRAN-ODT) 8 MG disintegrating tablet Take 1 tablet (8 mg total) by mouth every 12 (twelve) hours as needed for nausea.  30 tablet  2  . triamcinolone (KENALOG) 0.1 % cream Apply 1 application topically as needed.         No current facility-administered medications for this visit.    Allergies  Allergen Reactions  . Adhesive [Tape] Other (See Comments)    SKIN BLISTERS  . Penicillins   . Red Yeast Rice Hives  . Sulfa Drugs Cross Reactors Hives    Review of Systems negative except from HPI and PMH  Physical Exam There were no vitals taken for this visit. Well developed and well nourished in no acute  distress HENT normal E scleral and icterus clear Neck Supple JVP flat; carotids brisk and full Clear to ausculat Regular rate and rhythm, no murmurs gallops or rub Soft with active bowel sounds No clubbing cyanosis none Edema Alert and oriented, grossly normal motor and sensory function Skin Warm and Dry    Assessment and  Plan

## 2013-09-19 NOTE — Assessment & Plan Note (Addendum)
Blood pressure is excessive.  Will give her clonidine now  And recheck  She will need aggressive BP management with her PCP  Recheck blood pressure was 170 systolic. Will increase her Benicar from 20--20/12.5 twice a day and increase her metoprolol from 50--100. She'll need to see her PCP next week.  Renal profile 7/14 was normal and the potassium was also normal

## 2013-09-21 MED ORDER — CLONIDINE HCL 0.1 MG PO TABS
0.2000 mg | ORAL_TABLET | Freq: Once | ORAL | Status: AC
  Administered 2013-09-21: 0.2 mg via ORAL

## 2013-09-21 NOTE — Addendum Note (Signed)
Addended by: Baird Lyons on: 09/21/2013 04:00 PM   Modules accepted: Orders

## 2013-09-27 ENCOUNTER — Ambulatory Visit (INDEPENDENT_AMBULATORY_CARE_PROVIDER_SITE_OTHER): Payer: BC Managed Care – PPO | Admitting: Family Medicine

## 2013-09-27 ENCOUNTER — Encounter: Payer: Self-pay | Admitting: Family Medicine

## 2013-09-27 VITALS — BP 130/80 | HR 79 | Temp 97.9°F | Resp 16 | Ht 62.5 in | Wt 227.0 lb

## 2013-09-27 DIAGNOSIS — Z760 Encounter for issue of repeat prescription: Secondary | ICD-10-CM

## 2013-09-27 DIAGNOSIS — I10 Essential (primary) hypertension: Secondary | ICD-10-CM

## 2013-09-27 DIAGNOSIS — J209 Acute bronchitis, unspecified: Secondary | ICD-10-CM

## 2013-09-27 DIAGNOSIS — I495 Sick sinus syndrome: Secondary | ICD-10-CM

## 2013-09-27 DIAGNOSIS — J208 Acute bronchitis due to other specified organisms: Secondary | ICD-10-CM

## 2013-09-27 MED ORDER — METOPROLOL SUCCINATE ER 50 MG PO TB24: 100.0000 mg | ORAL_TABLET | Freq: Every day | ORAL | Status: DC

## 2013-09-27 MED ORDER — BENAZEPRIL-HYDROCHLOROTHIAZIDE 20-12.5 MG PO TABS: 1.0000 | ORAL_TABLET | Freq: Two times a day (BID) | ORAL | Status: DC

## 2013-09-27 NOTE — Patient Instructions (Signed)
SAMBUCOL (elderberry syrup) for cough and cold symptoms.

## 2013-09-28 ENCOUNTER — Telehealth: Payer: Self-pay | Admitting: *Deleted

## 2013-09-28 NOTE — Telephone Encounter (Signed)
Refaxed prescription and signed RX clarification to pharmacy because first RX was not clear, per Dr Audria Nine. Confirmation page received at 5:06.

## 2013-09-28 NOTE — Progress Notes (Signed)
Subjective:    Patient ID: [name] [name], female    DOB: [birth year], 57 y.o.   MRN: [id number]  Hypertension    This 57 y.o. Cauc female returns for follow-up HTN; her BP was elevated at last visit here 5 weeks ago; she was seen at [facility] for acute injury w/ BP= 160/80.  Pt is established w/  Dr. Graciela Husbands who monitors pacemaker function (sinoatrial node dysfunction) and was seen there 09/19/13.  Dr. Graciela Husbands changed med to Benazepril- HCT 20-12.5 mg bid as well as increased Metoprolol 50 mg 24 hr to 2 tablets daily. Pt has been compliant w/ all medications w/o report of adverse reactions. No reports of fatigue, vision disturbances, CP or tightness, palpitations, edema, NP cough or SOB, dizziness, rash or cold extremities. No depression or dysphoric mood reported.  Pt c/o URI symptoms, onset < 48 hours. Some exposure to ill grandchildren but not the Flu. She has mildly productive cough, rhinorrhea and sore throat but no fever/chills, diaphoresis, myalgias, rash or pleuritic chest pain. Appetite is good; no n/v/d.  PMHx, Soc and Fam Hx reviewed.   Medications reconciled.   Review of Systems  Constitutional: Negative for fever, chills, diaphoresis, activity change, appetite change and fatigue.  HENT: Positive for congestion, postnasal drip, rhinorrhea and sore throat. Negative for ear pain, mouth sores, sinus pressure, sneezing and trouble swallowing.   Eyes: Negative.   Respiratory: Positive for cough. Negative for wheezing.   Cardiovascular: Negative.   Gastrointestinal: Negative.   Musculoskeletal: Negative.   Skin: Positive for pallor and rash.  Neurological: Negative.   Psychiatric/Behavioral: Negative.        Objective:   Physical Exam  Nursing note and vitals reviewed. Constitutional: She is oriented to person, place, and time. Vital signs are normal. She appears well-developed and well-nourished. She does not appear ill. No distress.  HENT:  Head: Normocephalic and atraumatic.    Right Ear: Hearing, external ear and ear canal normal. Tympanic membrane is scarred. Tympanic membrane is not injected. No middle ear effusion.  Left Ear: Hearing, external ear and ear canal normal. Tympanic membrane is scarred. Tympanic membrane is not injected.  No middle ear effusion.  Nose: Mucosal edema and rhinorrhea present. No nasal deformity or septal deviation. Right sinus exhibits no maxillary sinus tenderness and no frontal sinus tenderness. Left sinus exhibits no maxillary sinus tenderness and no frontal sinus tenderness.  Eyes: EOM and lids are normal. Pupils are equal, round, and reactive to light. Right conjunctiva is injected. Left conjunctiva is injected. No scleral icterus.  Cardiovascular: Normal rate, regular rhythm, S1 normal, S2 normal and normal heart sounds.   No extrasystoles are present.  Pulmonary/Chest: Effort normal. No respiratory distress. She has decreased breath sounds in the right lower field and the left lower field. She has no wheezes. She has no rhonchi.  Lymphadenopathy:       Head (right side): No submental, no submandibular, no tonsillar, no preauricular, no posterior auricular and no occipital adenopathy present.       Head (left side): No submental, no submandibular, no tonsillar, no preauricular, no posterior auricular and no occipital adenopathy present.  Neurological: She is alert and oriented to person, place, and time. She has normal strength. No cranial nerve deficit. Coordination and gait normal.  Skin: Skin is warm, dry and intact. No rash noted. She is not diaphoretic. No cyanosis. No pallor.  Psychiatric: She has a normal mood and affect. Her behavior is normal. Judgment and thought content normal.  Assessment & Plan:  Severe essential hypertension- Encouraged compliance w/ new medication regimen. Follow-up w/ me as scheduled in Feb 2015 for DM monitoring (last A1c= 6.7%). Will need lipid panel checked; LDL= 195 in July 2014.  Sinoatrial  node dysfunction - As per Dr. Graciela Husbands- f/u in 1 year.  Plan: metoprolol succinate (TOPROL-XL) 50 MG 24 hr tablet  Acute viral bronchitis- Symptomatic treatment. Delay Flu vaccine until symptoms resolved.  Issue of repeat prescriptions   Meds ordered this encounter  Medications  . metoprolol succinate (TOPROL-XL) 50 MG 24 hr tablet    Sig: Take 2 tablets (100 mg total) by mouth daily. Take with or immediately following a meal.    Dispense:  180 tablet    Refill:  3  . benazepril-hydrochlorthiazide (LOTENSIN HCT) 20-12.5 MG per tablet    Sig: Take 1 tablet by mouth 2 (two) times daily.    Dispense:  90 tablet    Refill:  3

## 2013-10-08 ENCOUNTER — Encounter: Payer: Self-pay | Admitting: Internal Medicine

## 2013-10-18 ENCOUNTER — Other Ambulatory Visit: Payer: Self-pay

## 2013-10-18 MED ORDER — BENAZEPRIL-HYDROCHLOROTHIAZIDE 20-12.5 MG PO TABS: 1.0000 | ORAL_TABLET | Freq: Two times a day (BID) | ORAL | Status: DC

## 2013-10-18 MED ORDER — FLUTICASONE PROPIONATE 50 MCG/ACT NA SUSP: 2.0000 | Freq: Every day | NASAL | Status: DC

## 2013-10-22 ENCOUNTER — Telehealth: Payer: Self-pay

## 2013-10-22 MED ORDER — BENAZEPRIL-HYDROCHLOROTHIAZIDE 20-12.5 MG PO TABS: 1.0000 | ORAL_TABLET | Freq: Two times a day (BID) | ORAL | Status: DC

## 2013-10-22 NOTE — Telephone Encounter (Signed)
Pharm needs clarification on # Lotensin HCT Rx. Sig is for BID dosing, but # is only #90. Per Dr McPherson's OV notes, pt to remain on BID dosing. Sending in correct amount.

## 2013-11-22 ENCOUNTER — Other Ambulatory Visit: Payer: Self-pay | Admitting: Radiology

## 2013-11-22 ENCOUNTER — Encounter: Payer: Self-pay | Admitting: Family Medicine

## 2013-11-22 ENCOUNTER — Ambulatory Visit (INDEPENDENT_AMBULATORY_CARE_PROVIDER_SITE_OTHER): Payer: BC Managed Care – PPO | Admitting: Family Medicine

## 2013-11-22 VITALS — BP 114/68 | HR 61 | Temp 98.0°F | Resp 16 | Ht 63.0 in | Wt 231.8 lb

## 2013-11-22 DIAGNOSIS — I10 Essential (primary) hypertension: Secondary | ICD-10-CM

## 2013-11-22 DIAGNOSIS — H6980 Other specified disorders of Eustachian tube, unspecified ear: Secondary | ICD-10-CM

## 2013-11-22 DIAGNOSIS — Z23 Encounter for immunization: Secondary | ICD-10-CM

## 2013-11-22 DIAGNOSIS — H6982 Other specified disorders of Eustachian tube, left ear: Secondary | ICD-10-CM

## 2013-11-22 DIAGNOSIS — K760 Fatty (change of) liver, not elsewhere classified: Secondary | ICD-10-CM

## 2013-11-22 DIAGNOSIS — E119 Type 2 diabetes mellitus without complications: Secondary | ICD-10-CM

## 2013-11-22 MED ORDER — ONDANSETRON 8 MG PO TBDP: 8.0000 mg | ORAL_TABLET | Freq: Two times a day (BID) | ORAL | Status: DC | PRN

## 2013-11-22 MED ORDER — ONDANSETRON 8 MG PO TBDP
8.0000 mg | ORAL_TABLET | Freq: Two times a day (BID) | ORAL | Status: DC | PRN
Start: 2013-11-22 — End: 2013-11-22

## 2013-11-22 MED ORDER — BUTALBITAL-APAP-CAFFEINE 50-325-40 MG PO TABS: 1.0000 | ORAL_TABLET | Freq: Two times a day (BID) | ORAL | Status: DC | PRN

## 2013-11-22 NOTE — Patient Instructions (Signed)
Eustachian tube dysfunction- this may be related to chronic use of Flonase. I want you to use a different nasal spray. Get over-the-counter AYR saline mist and use it instead.

## 2013-11-22 NOTE — Progress Notes (Signed)
S:  This 58 y.o. Cauc female has well controlled Type II DM and HTN; she is followed by Dr. Caryl Comes for sinoatrial node dysfunction. Last visit w/ him in Dec 2014; pt is doing well and will follow-up in office in 12 months. Pt maintains consistency re: DM medications, nutrition and activity. FSBS= 80-150. She says she checks her A1c w/ an OTC test every few months. No symptoms of hypoglycemia. Pt denies fatigue, vision disturbances, CP or tightness, edema, SOB or DOE, cough, HA, dizziness, numbness or syncope.  Pt c/o chronic L ear noise that sounds like sandpaper; there is mild discomfort right below the angle of the jaw. She denies tinnitus, hearing loss, sinus pain or congestion, epistaxis, discomfort w/ chewing, dysphagia or any discomfort w/ swallowing. 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  Patient Active Problem List   Diagnosis Date Noted  . Pacemaker Medtronic   . Ventricular tachycardia, nonsustained-during exercise testing 03/07/2013  . Syncope and collapse 10/19/2011  . Sinoatrial node dysfunction   . OSA (obstructive sleep apnea) 05/25/2011  . Hx of adenomatous colonic polyps 05/25/2011  . Migraine 05/25/2011  . DM2 (diabetes mellitus, type 2) 05/25/2011  . GERD (gastroesophageal reflux disease) 05/25/2011  . NAFLD (nonalcoholic fatty liver disease) 05/25/2011  . Hyperlipidemia 05/25/2011  . Severe essential hypertension 09/16/2009  . DIASTOLIC HEART FAILURE, CHRONIC 09/16/2009  . DYSPNEA 09/16/2009    Prior to Admission medications   Medication Sig Start Date End Date Taking? Authorizing Provider  albuterol (PROAIR HFA) 108 (90 BASE) MCG/ACT inhaler Inhale 2 puffs into the lungs as needed. 12/20/12  Yes Barton Fanny, MD  ALPRAZolam Duanne Moron) 0.25 MG tablet Take 1 tablet once a day prn anxiety. 08/22/13  Yes Barton Fanny, MD  beclomethasone (QVAR) 80 MCG/ACT inhaler Inhale 2 puffs twice a day. Rinse after use. 02/28/13  Yes  Barton Fanny, MD  benazepril-hydrochlorthiazide (LOTENSIN HCT) 20-12.5 MG per tablet Take 1 tablet by mouth 2 (two) times daily. 10/22/13  Yes Barton Fanny, MD  butalbital-acetaminophen-caffeine (FIORICET, ESGIC) [id number] MG per tablet Take 1 tablet by mouth 2 (two) times daily as needed for headache. 11/22/13  Yes Barton Fanny, MD  cetirizine (ZYRTEC) 10 MG tablet Take 10 mg by mouth daily.     Yes Historical Provider, MD  Cholecalciferol (VITAMIN D) 2000 UNITS CAPS Take by mouth.     Yes Historical Provider, MD  citalopram (CELEXA) 20 MG tablet Take 1 tablet (20 mg total) by mouth daily. 04/18/13  Yes Barton Fanny, MD  diclofenac (VOLTAREN) 75 MG EC tablet Take 1 tablet (75 mg total) by mouth 2 (two) times daily. 04/18/13  Yes Barton Fanny, MD  fish oil-omega-3 fatty acids 1000 MG capsule Take 2 g by mouth daily. PER PATIENT TAKE 8000 MG DAILY   Yes Historical Provider, MD  fluticasone (FLONASE) 50 MCG/ACT nasal spray Place 2 sprays into both nostrils daily. 10/18/13 10/18/14 Yes Barton Fanny, MD  gabapentin (NEURONTIN) 300 MG capsule Take 1 capsule in AM and 2 caps at bedtime. 04/18/13  Yes Barton Fanny, MD  glimepiride (AMARYL) 4 MG tablet Take 1/2 tablet twice a day before main meals. 04/18/13  Yes Barton Fanny, MD  hydrOXYzine (ATARAX/VISTARIL) 25 MG tablet as needed. 08/24/13  Yes Historical Provider, MD  losartan (COZAAR) 100 MG tablet Take 1 tablet (100 mg total) by mouth daily. 08/22/13  Yes Barton Fanny, MD  metoprolol succinate (TOPROL-XL) 50  MG 24 hr tablet Take 2 tablets (100 mg total) by mouth daily. Take with or immediately following a meal. 09/27/13  Yes Barton Fanny, MD  Multiple Vitamin (MULTIVITAMIN) tablet Take 1 tablet by mouth daily.     Yes Historical Provider, MD  omeprazole (PRILOSEC) 20 MG capsule Take 1 capsule (20 mg total) by mouth daily. 04/18/13  Yes Barton Fanny, MD  ondansetron (ZOFRAN-ODT) 8 MG  disintegrating tablet Take 1 tablet (8 mg total) by mouth every 12 (twelve) hours as needed for nausea. 11/22/13  Yes Barton Fanny, MD  triamcinolone (KENALOG) 0.1 % cream Apply 1 application topically as needed.     Yes Historical Provider, MD  HYDROcodone-homatropine (HYCODAN) 5-1.5 MG/5ML syrup Take 5 mLs by mouth every 4 (four) hours as needed for cough. 08/06/13   Posey Boyer, MD   PMHx, Surg Hx, Soc and Fam Hx reviewed.  ROS: As per HPI.  O: Filed Vitals:   11/22/13 0843  BP: 114/68  Pulse: 61  Temp: 98 F (36.7 C)  Resp: 16   GEN: in NAD; WN,WD. HEENT: [place]/AT; EOMI w/ clear conj/sclerae. Nasal mucosa normal. EACs/ canals and TMs normal; L TM mildly hyperemic. Absent cerumen. Post ph mildly erythematous w/o lesions.  Hearing is normal. NECK: Suppple w/o LAN or TMG. Tender at angle of L mandible. No palpable abnormality. COR: RRR. LUNGS: Normal resp rate and effort. SKIN: W&D; intact w/o diaphoresis, erythema or pallor. NEURO: A&O x 3; CNs intact. Nonfocal.  A/P: Type II or unspecified type diabetes mellitus without mention of complication, not stated as uncontrolled - No medication changes at this time. Plan: HM Diabetes Foot Exam  Severe essential hypertension- Stable and controlled; continue current medications. Encouraged weight reduction. Exercise as tolerated.  Chronic dysfunction of left Eustachian tube- May be due to Steroid nasal spray. Try holding this medication. Use OTC saline nasal mist (AYR is good brand).  Need for prophylactic vaccination and inoculation against influenza - Plan: Flu Vaccine QUAD 36+ mos IM  NAFLD (nonalcoholic fatty liver disease) - Plan: ondansetron (ZOFRAN-ODT) 8 MG disintegrating tablet, DISCONTINUED: ondansetron (ZOFRAN-ODT) 8 MG disintegrating tablet  Meds ordered this encounter  Medications  . DISCONTD: butalbital-acetaminophen-caffeine (FIORICET, ESGIC) 50-325-40 MG per tablet    Sig: Take 1 tablet by mouth 2 (two) times  daily as needed for headache.    Dispense:  30 tablet    Refill:  1  . DISCONTD: ondansetron (ZOFRAN-ODT) 8 MG disintegrating tablet    Sig: Take 1 tablet (8 mg total) by mouth every 12 (twelve) hours as needed for nausea.    Dispense:  30 tablet    Refill:  2  . butalbital-acetaminophen-caffeine (FIORICET, ESGIC) 50-325-40 MG per tablet    Sig: Take 1 tablet by mouth 2 (two) times daily as needed for headache.    Dispense:  30 tablet    Refill:  1  . ondansetron (ZOFRAN-ODT) 8 MG disintegrating tablet    Sig: Take 1 tablet (8 mg total) by mouth every 12 (twelve) hours as needed for nausea.    Dispense:  30 tablet    Refill:  2

## 2013-12-21 ENCOUNTER — Encounter: Payer: BC Managed Care – PPO | Admitting: *Deleted

## 2013-12-27 ENCOUNTER — Encounter: Payer: Self-pay | Admitting: *Deleted

## 2013-12-28 ENCOUNTER — Ambulatory Visit (INDEPENDENT_AMBULATORY_CARE_PROVIDER_SITE_OTHER): Payer: BC Managed Care – PPO

## 2013-12-28 ENCOUNTER — Encounter: Payer: Self-pay | Admitting: Internal Medicine

## 2013-12-28 DIAGNOSIS — I495 Sick sinus syndrome: Secondary | ICD-10-CM

## 2013-12-30 LAB — MDC_IDC_ENUM_SESS_TYPE_REMOTE
Battery Remaining Longevity: 121 mo
Battery Voltage: 2.79 V
Brady Statistic AP VP Percent: 0.1 %
Lead Channel Pacing Threshold Amplitude: 0.5 V
Lead Channel Pacing Threshold Amplitude: 0.5 V
Lead Channel Pacing Threshold Pulse Width: 0.4 ms
Lead Channel Sensing Intrinsic Amplitude: 8 mV
Lead Channel Setting Pacing Amplitude: 2 V
Lead Channel Setting Pacing Amplitude: 2.5 V
Lead Channel Setting Pacing Pulse Width: 0.4 ms
Lead Channel Setting Sensing Sensitivity: 4 mV
MDC IDC MSMT LEADCHNL RA IMPEDANCE VALUE: 472 Ohm
MDC IDC MSMT LEADCHNL RA PACING THRESHOLD PULSEWIDTH: 0.4 ms
MDC IDC MSMT LEADCHNL RA SENSING INTR AMPL: 1.4 mV
MDC IDC MSMT LEADCHNL RV IMPEDANCE VALUE: 525 Ohm
MDC IDC STAT BRADY AP VS PERCENT: 0.1 %
MDC IDC STAT BRADY AS VP PERCENT: 0.4 %
MDC IDC STAT BRADY AS VS PERCENT: 99.5 %

## 2014-01-08 NOTE — Progress Notes (Signed)
PPM remote 

## 2014-01-09 ENCOUNTER — Encounter: Payer: Self-pay | Admitting: *Deleted

## 2014-02-11 ENCOUNTER — Other Ambulatory Visit: Payer: Self-pay | Admitting: Family Medicine

## 2014-02-28 ENCOUNTER — Encounter: Payer: BC Managed Care – PPO | Admitting: Family Medicine

## 2014-04-02 ENCOUNTER — Telehealth: Payer: Self-pay | Admitting: Cardiology

## 2014-04-02 ENCOUNTER — Ambulatory Visit: Payer: BC Managed Care – PPO | Admitting: *Deleted

## 2014-04-02 NOTE — Telephone Encounter (Signed)
LMOVM reminding pt to send remote transmission.   

## 2014-04-03 ENCOUNTER — Encounter: Payer: Self-pay | Admitting: Cardiology

## 2014-04-05 ENCOUNTER — Ambulatory Visit (INDEPENDENT_AMBULATORY_CARE_PROVIDER_SITE_OTHER): Payer: BC Managed Care – PPO | Admitting: *Deleted

## 2014-04-05 DIAGNOSIS — I495 Sick sinus syndrome: Secondary | ICD-10-CM

## 2014-04-05 LAB — MDC_IDC_ENUM_SESS_TYPE_REMOTE
Battery Impedance: 251 Ohm
Battery Remaining Longevity: 131 mo
Brady Statistic AP VP Percent: 0 %
Brady Statistic AS VS Percent: 100 %
Lead Channel Impedance Value: 525 Ohm
Lead Channel Pacing Threshold Amplitude: 0.5 V
Lead Channel Pacing Threshold Pulse Width: 0.4 ms
Lead Channel Pacing Threshold Pulse Width: 0.4 ms
Lead Channel Sensing Intrinsic Amplitude: 1.4 mV
Lead Channel Sensing Intrinsic Amplitude: 8 mV
Lead Channel Setting Pacing Amplitude: 2 V
Lead Channel Setting Pacing Pulse Width: 0.4 ms
Lead Channel Setting Sensing Sensitivity: 4 mV
MDC IDC MSMT BATTERY VOLTAGE: 2.79 V
MDC IDC MSMT LEADCHNL RA IMPEDANCE VALUE: 435 Ohm
MDC IDC MSMT LEADCHNL RA PACING THRESHOLD AMPLITUDE: 0.5 V
MDC IDC SESS DTM: 20150626203655
MDC IDC SET LEADCHNL RV PACING AMPLITUDE: 2.5 V
MDC IDC STAT BRADY AP VS PERCENT: 0 %
MDC IDC STAT BRADY AS VP PERCENT: 0 %

## 2014-04-06 ENCOUNTER — Other Ambulatory Visit: Payer: Self-pay | Admitting: Family Medicine

## 2014-04-08 NOTE — Progress Notes (Signed)
Remote pacemaker transmission.   

## 2014-04-24 ENCOUNTER — Encounter: Payer: Self-pay | Admitting: Cardiology

## 2014-04-26 ENCOUNTER — Encounter: Payer: Self-pay | Admitting: Cardiology

## 2014-04-30 ENCOUNTER — Encounter: Payer: Self-pay | Admitting: Internal Medicine

## 2014-05-01 ENCOUNTER — Ambulatory Visit (INDEPENDENT_AMBULATORY_CARE_PROVIDER_SITE_OTHER): Payer: BC Managed Care – PPO | Admitting: Family Medicine

## 2014-05-01 ENCOUNTER — Encounter: Payer: Self-pay | Admitting: Family Medicine

## 2014-05-01 VITALS — BP 130/80 | HR 59 | Temp 98.3°F | Resp 16 | Ht 63.0 in | Wt 233.4 lb

## 2014-05-01 DIAGNOSIS — G4762 Sleep related leg cramps: Secondary | ICD-10-CM

## 2014-05-01 DIAGNOSIS — Z0000 Encounter for general adult medical examination without abnormal findings: Secondary | ICD-10-CM

## 2014-05-01 DIAGNOSIS — Z23 Encounter for immunization: Secondary | ICD-10-CM

## 2014-05-01 DIAGNOSIS — E119 Type 2 diabetes mellitus without complications: Secondary | ICD-10-CM

## 2014-05-01 DIAGNOSIS — R0602 Shortness of breath: Secondary | ICD-10-CM

## 2014-05-01 DIAGNOSIS — R928 Other abnormal and inconclusive findings on diagnostic imaging of breast: Secondary | ICD-10-CM

## 2014-05-01 LAB — CBC WITH DIFFERENTIAL/PLATELET
Basophils Absolute: 0 10*3/uL (ref 0.0–0.1)
Basophils Relative: 1 % (ref 0–1)
Eosinophils Absolute: 0.2 10*3/uL (ref 0.0–0.7)
Eosinophils Relative: 4 % (ref 0–5)
HCT: 35.8 % — ABNORMAL LOW (ref 36.0–46.0)
Hemoglobin: 12.1 g/dL (ref 12.0–15.0)
LYMPHS ABS: 1.5 10*3/uL (ref 0.7–4.0)
Lymphocytes Relative: 32 % (ref 12–46)
MCH: 29.1 pg (ref 26.0–34.0)
MCHC: 33.8 g/dL (ref 30.0–36.0)
MCV: 86.1 fL (ref 78.0–100.0)
Monocytes Absolute: 0.3 10*3/uL (ref 0.1–1.0)
Monocytes Relative: 6 % (ref 3–12)
NEUTROS ABS: 2.6 10*3/uL (ref 1.7–7.7)
NEUTROS PCT: 57 % (ref 43–77)
PLATELETS: 257 10*3/uL (ref 150–400)
RBC: 4.16 MIL/uL (ref 3.87–5.11)
RDW: 13.8 % (ref 11.5–15.5)
WBC: 4.6 10*3/uL (ref 4.0–10.5)

## 2014-05-01 LAB — COMPLETE METABOLIC PANEL WITH GFR
ALBUMIN: 4.6 g/dL (ref 3.5–5.2)
ALT: 24 U/L (ref 0–35)
AST: 21 U/L (ref 0–37)
Alkaline Phosphatase: 64 U/L (ref 39–117)
BUN: 40 mg/dL — AB (ref 6–23)
CALCIUM: 9.5 mg/dL (ref 8.4–10.5)
CHLORIDE: 107 meq/L (ref 96–112)
CO2: 24 mEq/L (ref 19–32)
Creat: 1.25 mg/dL — ABNORMAL HIGH (ref 0.50–1.10)
GFR, Est African American: 54 mL/min — ABNORMAL LOW
GFR, Est Non African American: 47 mL/min — ABNORMAL LOW
Glucose, Bld: 119 mg/dL — ABNORMAL HIGH (ref 70–99)
Potassium: 5 mEq/L (ref 3.5–5.3)
SODIUM: 141 meq/L (ref 135–145)
TOTAL PROTEIN: 7 g/dL (ref 6.0–8.3)
Total Bilirubin: 0.3 mg/dL (ref 0.2–1.2)

## 2014-05-01 LAB — POCT URINALYSIS DIPSTICK
Bilirubin, UA: NEGATIVE
Glucose, UA: NEGATIVE
KETONES UA: NEGATIVE
Leukocytes, UA: NEGATIVE
Nitrite, UA: NEGATIVE
PH UA: 5
PROTEIN UA: NEGATIVE
Spec Grav, UA: 1.02
Urobilinogen, UA: 0.2

## 2014-05-01 LAB — LIPID PANEL
CHOLESTEROL: 277 mg/dL — AB (ref 0–200)
HDL: 44 mg/dL (ref >39)
LDL CALC: 178 mg/dL — AB (ref 0–99)
Total CHOL/HDL Ratio: 6.3 Ratio
Triglycerides: 273 mg/dL — ABNORMAL HIGH (ref <150)
VLDL: 55 mg/dL — AB (ref 0–40)

## 2014-05-01 LAB — HEPATITIS C ANTIBODY: HCV Ab: NEGATIVE

## 2014-05-01 LAB — POCT GLYCOSYLATED HEMOGLOBIN (HGB A1C): Hemoglobin A1C: 6.5

## 2014-05-01 MED ORDER — GLIMEPIRIDE 4 MG PO TABS: ORAL_TABLET | ORAL | Status: DC

## 2014-05-01 MED ORDER — ZOSTER VACCINE LIVE 19400 UNT/0.65ML ~~LOC~~ SOLR: 0.6500 mL | Freq: Once | SUBCUTANEOUS | Status: DC

## 2014-05-01 MED ORDER — ALBUTEROL SULFATE HFA 108 (90 BASE) MCG/ACT IN AERS: 2.0000 | INHALATION_SPRAY | RESPIRATORY_TRACT | Status: DC | PRN

## 2014-05-01 MED ORDER — ALPRAZOLAM 0.25 MG PO TABS: ORAL_TABLET | ORAL | Status: DC

## 2014-05-01 MED ORDER — BECLOMETHASONE DIPROPIONATE 80 MCG/ACT IN AERS: INHALATION_SPRAY | RESPIRATORY_TRACT | Status: DC

## 2014-05-01 NOTE — Progress Notes (Signed)
Subjective:    Patient ID: [name] [name], female    DOB: September 24, 1956, 58 y.o.   MRN: [id number]  HPI  This 58 y.o. Cauc female is here for CPE. She is s/p BSO-TAH (benign) in the remote past. Type II DM is controlled w/ Nov 2014  A1c= 6.7%. Periodic cardiac follow-up with  Dr. Caryl Comes is current.  Pt is compliant w/ all medications and reports no adverse effects. She does have c/o nocturnal leg cramps.  HCM: MMG- Current per pt; imaging performed at [facility] (abnl R breast) 2014.           CRS- Current.           IMM- Needs Zostavax.           Vision- Annually or as warranted.  Patient Active Problem List   Diagnosis Date Noted  . Pacemaker Medtronic   . Ventricular tachycardia, nonsustained-during exercise testing 03/07/2013  . Syncope and collapse 10/19/2011  . Sinoatrial node dysfunction   . OSA (obstructive sleep apnea) 05/25/2011  . Hx of adenomatous colonic polyps 05/25/2011  . Migraine 05/25/2011  . DM2 (diabetes mellitus, type 2) 05/25/2011  . GERD (gastroesophageal reflux disease) 05/25/2011  . NAFLD (nonalcoholic fatty liver disease) 05/25/2011  . Hyperlipidemia 05/25/2011  . Severe essential hypertension 09/16/2009  . DIASTOLIC HEART FAILURE, CHRONIC 09/16/2009  . DYSPNEA 09/16/2009    Prior to Admission medications   Medication Sig Start Date End Date Taking? Authorizing Provider  albuterol (PROAIR HFA) 108 (90 BASE) MCG/ACT inhaler Inhale 2 puffs into the lungs as needed.   Yes Barton Fanny, MD  ALPRAZolam Duanne Moron) 0.25 MG tablet Take 1 tablet once a day prn anxiety.   Yes Barton Fanny, MD  benazepril-hydrochlorthiazide (LOTENSIN HCT) 20-12.5 MG per tablet Take 1 tablet by mouth 2 (two) times daily. 10/22/13  Yes Barton Fanny, MD  butalbital-acetaminophen-caffeine (FIORICET, ESGIC) [phone] MG per tablet Take 1 tablet by mouth 2 (two) times daily as needed for headache. 11/22/13  Yes Barton Fanny, MD  cetirizine (ZYRTEC) 10 MG tablet  Take 10 mg by mouth daily.     Yes Historical Provider, MD  Cholecalciferol (VITAMIN D) 2000 UNITS CAPS Take by mouth.     Yes Historical Provider, MD  citalopram (CELEXA) 20 MG tablet TAKE 1 BY MOUTH DAILY   Yes Eleanore E Egan, PA-C  diclofenac (VOLTAREN) 75 MG EC tablet TAKE 1 BY MOUTH TWICE DAILY   Yes Eleanore E Egan, PA-C  fish oil-omega-3 fatty acids 1000 MG capsule Take 2 g by mouth daily. PER PATIENT TAKE 8000 MG DAILY   Yes Historical Provider, MD  fluticasone (FLONASE) 50 MCG/ACT nasal spray Place 2 sprays into both nostrils daily. 10/18/13 10/18/14 Yes Barton Fanny, MD  gabapentin (NEURONTIN) 300 MG capsule TAKE 1 BY MOUTH IN THE MORNING AND 2 AT BEDTIME   Yes Eleanore E Egan, PA-C  glimepiride (AMARYL) 4 MG tablet Take 1/2 tablet twice a day before main meals.   Yes Barton Fanny, MD  hydrOXYzine (ATARAX/VISTARIL) 25 MG tablet as needed. 08/24/13  Yes Historical Provider, MD  losartan (COZAAR) 100 MG tablet Take 1 tablet (100 mg total) by mouth daily. 08/22/13  Yes Barton Fanny, MD  metoprolol succinate (TOPROL-XL) 50 MG 24 hr tablet Take 2 tablets (100 mg total) by mouth daily. Take with or immediately following a meal. 09/27/13  Yes Barton Fanny, MD  Multiple Vitamin (MULTIVITAMIN) tablet Take 1 tablet by mouth daily.  Yes Historical Provider, MD  omeprazole (PRILOSEC) 20 MG capsule TAKE 1 BY MOUTH DAILY   Yes Eleanore E Egan, PA-C  ondansetron (ZOFRAN-ODT) 8 MG disintegrating tablet DISSOLVE 1 ON TONGUE EVERY 12 HOURS AS NEEDED FOR NAUSEA   Yes Barton Fanny, MD  triamcinolone (KENALOG) 0.1 % cream Apply 1 application topically as needed.     Yes Historical Provider, MD  beclomethasone (QVAR) 80 MCG/ACT inhaler Inhale 2 puffs twice a day. Rinse after use.    Barton Fanny, MD  HYDROcodone-homatropine [facility]) 5-1.5 MG/5ML syrup Take 5 mLs by mouth every 4 (four) hours as needed for cough. 08/06/13   Posey Boyer, MD     PMHx, Surg Hx, Soc  nad Fam Hx reviewed.   Review of Systems  Constitutional: Negative.   HENT: Negative.   Eyes: Negative.   Respiratory: Negative.   Cardiovascular: Negative.   Gastrointestinal: Negative.   Endocrine: Negative.   Genitourinary: Negative.   Musculoskeletal: Positive for myalgias.  Skin: Negative.   Allergic/Immunologic: Positive for environmental allergies.  Neurological: Negative.   Hematological: Negative.   Psychiatric/Behavioral: Negative.       Objective:   Physical Exam  Nursing note and vitals reviewed. Constitutional: She is oriented to person, place, and time. Vital signs are normal. She appears well-developed and well-nourished. No distress.  HENT:  Head: Normocephalic and atraumatic.  Right Ear: Hearing, tympanic membrane, external ear and ear canal normal.  Left Ear: Hearing, external ear and ear canal normal.  Nose: Nose normal. No nasal deformity or septal deviation.  Mouth/Throat: Uvula is midline, oropharynx is clear and moist and mucous membranes are normal. No oral lesions. Normal dentition.  Eyes: Conjunctivae, EOM and lids are normal. Pupils are equal, round, and reactive to light. No scleral icterus.  Fundoscopic exam:      The right eye shows no papilledema. The right eye shows red reflex.       The left eye shows no papilledema. The left eye shows red reflex.  Fundoscopic exam difficult; intraocular lens on R.   Neck: Trachea normal, normal range of motion and full passive range of motion without pain. Neck supple. No JVD present. No spinous process tenderness and no muscular tenderness present. Carotid bruit is not present. No mass and no thyromegaly present.  Cardiovascular: Normal rate, regular rhythm, S1 normal, S2 normal, normal heart sounds and normal pulses.   No extrasystoles are present. Exam reveals no gallop and no friction rub.   No murmur heard. Pulmonary/Chest: Effort normal and breath sounds normal. No respiratory distress. Right breast  exhibits no inverted nipple, no mass, no nipple discharge, no skin change and no tenderness. Left breast exhibits no inverted nipple, no mass, no nipple discharge, no skin change and no tenderness. Breasts are symmetrical.  Abdominal: Soft. Normal appearance, normal aorta and bowel sounds are normal. She exhibits no distension, no pulsatile midline mass and no mass. There is no hepatosplenomegaly. There is no tenderness. There is no guarding and no CVA tenderness. No hernia.  Abdominal obesity noted.  Genitourinary:  Deferred.  Musculoskeletal:       Cervical back: Normal.       Thoracic back: Normal.       Lumbar back: Normal.  Unremarkable.  Lymphadenopathy:       Head (right side): No submental, no submandibular, no tonsillar, no posterior auricular and no occipital adenopathy present.       Head (left side): No submental, no submandibular, no tonsillar, no posterior auricular  and no occipital adenopathy present.    She has no cervical adenopathy.    She has no axillary adenopathy.       Right: No inguinal and no supraclavicular adenopathy present.       Left: No inguinal and no supraclavicular adenopathy present.  Neurological: She is alert and oriented to person, place, and time. She has normal strength. She displays no atrophy and no tremor. No cranial nerve deficit or sensory deficit. She exhibits normal muscle tone. Coordination and gait normal. She displays no Babinski's sign on the right side. She displays no Babinski's sign on the left side.  Reflex Scores:      Tricep reflexes are 1+ on the right side and 1+ on the left side.      Bicep reflexes are 2+ on the right side and 2+ on the left side.      Brachioradialis reflexes are 1+ on the right side and 1+ on the left side.      Patellar reflexes are 2+ on the right side and 2+ on the left side. Skin: Skin is warm, dry and intact. No laceration, no lesion and no rash noted. She is not diaphoretic. No cyanosis or erythema. No pallor.  Nails show no clubbing.  Psychiatric: She has a normal mood and affect. Her speech is normal and behavior is normal. Judgment and thought content normal. Cognition and memory are normal.    Results for orders placed in visit on 05/01/14  POCT URINALYSIS DIPSTICK      Result Value Ref Range   Color, UA yellow     Clarity, UA clear     Glucose, UA neg     Bilirubin, UA neg     Ketones, UA neg     Spec Grav, UA 1.020     Blood, UA trace     pH, UA 5.0     Protein, UA neg     Urobilinogen, UA 0.2     Nitrite, UA neg     Leukocytes, UA Negative    POCT GLYCOSYLATED HEMOGLOBIN (HGB A1C)      Result Value Ref Range   Hemoglobin A1C 6.5        Assessment & Plan:  Routine general medical examination at a health care facility - Plan: POCT urinalysis dipstick, Hepatitis C antibody, COMPLETE METABOLIC PANEL WITH GFR, Lipid panel, CBC with Differential, IFOBT POC (occult bld, rslt in office), IFOBT POC (occult bld, rslt in office), IFOBT POC (occult bld, rslt in office)  Type II or unspecified type diabetes mellitus without mention of complication, not stated as uncontrolled - StablePlan: HM Diabetes Foot Exam, POCT glycosylated hemoglobin (Hb A1C), CBC with Differential  Nocturnal leg cramps- Print information given re: causes and possible treatment.  Abnormal mammogram of right breast - Plan: MM Digital Diagnostic Bilat  DYSPNEA - Plan: beclomethasone (QVAR) 80 MCG/ACT inhaler, albuterol (PROAIR HFA) 108 (90 BASE) MCG/ACT inhaler, DISCONTINUED: albuterol (PROAIR HFA) 108 (90 BASE) MCG/ACT inhaler  Need for shingles vaccine - Plan: zoster vaccine live, PF, (ZOSTAVAX) 14970 UNT/0.65ML injection  Meds ordered this encounter  Medications  . ALPRAZolam (XANAX) 0.25 MG tablet    Sig: Take 1 tablet once a day prn anxiety.    Dispense:  30 tablet    Refill:  1  . beclomethasone (QVAR) 80 MCG/ACT inhaler    Sig: Inhale 2 puffs twice a day. Rinse after use.    Refill:  3  . albuterol (PROAIR  HFA) 108 (90 BASE) MCG/ACT  inhaler    Sig: Inhale 2 puffs into the lungs as needed.    Dispense:  2 Inhaler    Refill:  3  . glimepiride (AMARYL) 4 MG tablet    Sig: Take 1/2 tablet twice a day before main meals.    Dispense:  90 tablet    Refill:  3  . zoster vaccine live, PF, (ZOSTAVAX) 99242 UNT/0.65ML injection    Sig: Inject 19,400 Units into the skin once.    Dispense:  1 each    Refill:  0

## 2014-05-01 NOTE — Patient Instructions (Signed)
Keeping You Healthy  Get These Tests  Blood Pressure- Have your blood pressure checked by your healthcare provider at least once a year.  Normal blood pressure is 120/80.  Weight- Have your body mass index (BMI) calculated to screen for obesity.  BMI is a measure of body fat based on height and weight.  You can calculate your own BMI at www.nhlbisupport.com/bmi/  Cholesterol- Have your cholesterol checked every year.  Diabetes- Have your blood sugar checked every year if you have high blood pressure, high cholesterol, a family history of diabetes or if you are overweight.  Pap Smear- Have a pap smear every 1 to 3 years if you have been sexually active.  If you are older than 65 and recent pap smears have been normal you may not need additional pap smears.  In addition, if you have had a hysterectomy  For benign disease additional pap smears are not necessary.  Mammogram-Yearly mammograms are essential for early detection of breast cancer  Screening for Colon Cancer- Colonoscopy starting at age 50. Screening may begin sooner depending on your family history and other health conditions.  Follow up colonoscopy as directed by your Gastroenterologist.  Screening for Osteoporosis- Screening begins at age 65 with bone density scanning, sooner if you are at higher risk for developing Osteoporosis.  Get these medicines  Calcium with Vitamin D- Your body requires 1200-1500 mg of Calcium a day and 800-1000 IU of Vitamin D a day.  You can only absorb 500 mg of Calcium at a time therefore Calcium must be taken in 2 or 3 separate doses throughout the day.  Hormones- Hormone therapy has been associated with increased risk for certain cancers and heart disease.  Talk to your healthcare provider about if you need relief from menopausal symptoms.  Aspirin- Ask your healthcare provider about taking Aspirin to prevent Heart Disease and Stroke.  Get these Immuniztions  Flu shot- Every fall  Pneumonia  shot- Once after the age of 65; if you are younger ask your healthcare provider if you need a pneumonia shot.  Tetanus- Every ten years.  Zostavax- Once after the age of 60 to prevent shingles.  Take these steps  Don't smoke- Your healthcare provider can help you quit. For tips on how to quit, ask your healthcare provider or go to www.smokefree.gov or call 1-800 QUIT-NOW.  Be physically active- Exercise 5 days a week for a minimum of 30 minutes.  If you are not already physically active, start slow and gradually work up to 30 minutes of moderate physical activity.  Try walking, dancing, bike riding, swimming, etc.  Eat a healthy diet- Eat a variety of healthy foods such as fruits, vegetables, whole grains, low fat milk, low fat cheeses, yogurt, lean meats, chicken, fish, eggs, dried beans, tofu, etc.  For more information go to www.thenutritionsource.org  Dental visit- Brush and floss teeth twice daily; visit your dentist twice a year.  Eye exam- Visit your Optometrist or Ophthalmologist yearly.  Drink alcohol in moderation- Limit alcohol intake to one drink or less a day.  Never drink and drive.  Depression- Your emotional health is as important as your physical health.  If you're feeling down or losing interest in things you normally enjoy, please talk to your healthcare provider.  Seat Belts- can save your life; always wear one  Smoke/Carbon Monoxide detectors- These detectors need to be installed on the appropriate level of your home.  Replace batteries at least once a year.  Violence- If anyone   is threatening or hurting you, please tell your healthcare provider.  Living Will/ Health care power of attorney- Discuss with your healthcare provider and family.     Muscle Cramps and Spasms Muscle cramps and spasms occur when a muscle or muscles tighten and you have no control over this tightening (involuntary muscle contraction). They are a common problem and can develop in any  muscle. The most common place is in the calf muscles of the leg. Both muscle cramps and muscle spasms are involuntary muscle contractions, but they also have differences:   Muscle cramps are sporadic and painful. They may last a few seconds to a quarter of an hour. Muscle cramps are often more forceful and last longer than muscle spasms.  Muscle spasms may or may not be painful. They may also last just a few seconds or much longer. CAUSES  It is uncommon for cramps or spasms to be due to a serious underlying problem. In many cases, the cause of cramps or spasms is unknown. Some common causes are:   Overexertion.   Overuse from repetitive motions (doing the same thing over and over).   Remaining in a certain position for a long period of time.   Improper preparation, form, or technique while performing a sport or activity.   Dehydration.   Injury.   Side effects of some medicines.   Abnormally low levels of the salts and ions in your blood (electrolytes), especially potassium and calcium. This could happen if you are taking water pills (diuretics) or you are pregnant.  Some underlying medical problems can make it more likely to develop cramps or spasms. These include, but are not limited to:   Diabetes.   Parkinson disease.   Hormone disorders, such as thyroid problems.   Alcohol abuse.   Diseases specific to muscles, joints, and bones.   Blood vessel disease where not enough blood is getting to the muscles.  HOME CARE INSTRUCTIONS   Stay well hydrated. Drink enough water and fluids to keep your urine clear or pale yellow.  It may be helpful to massage, stretch, and relax the affected muscle.  For tight or tense muscles, use a warm towel, heating pad, or hot shower water directed to the affected area.  If you are sore or have pain after a cramp or spasm, applying ice to the affected area may relieve discomfort.  Put ice in a plastic bag.  Place a towel  between your skin and the bag.  Leave the ice on for 15-20 minutes, 03-04 times a day.  Medicines used to treat a known cause of cramps or spasms may help reduce their frequency or severity. Only take over-the-counter or prescription medicines as directed by your caregiver. SEEK MEDICAL CARE IF:  Your cramps or spasms get more severe, more frequent, or do not improve over time.  MAKE SURE YOU:   Understand these instructions.  Will watch your condition.  Will get help right away if you are not doing well or get worse. Document Released: 03/19/2002 Document Revised: 01/22/2013 Document Reviewed: 09/13/2012 [facility] Patient Information 2015 [place], [place]. This information is not intended to replace advice given to you by your health care provider. Make sure you discuss any questions you have with your health care provider.

## 2014-05-20 LAB — HM MAMMOGRAPHY

## 2014-06-27 ENCOUNTER — Ambulatory Visit (INDEPENDENT_AMBULATORY_CARE_PROVIDER_SITE_OTHER): Payer: BC Managed Care – PPO | Admitting: Family Medicine

## 2014-06-27 ENCOUNTER — Ambulatory Visit (INDEPENDENT_AMBULATORY_CARE_PROVIDER_SITE_OTHER): Payer: BC Managed Care – PPO

## 2014-06-27 VITALS — BP 146/88 | HR 96 | Temp 98.0°F | Resp 17 | Ht 63.5 in | Wt 237.0 lb

## 2014-06-27 DIAGNOSIS — R059 Cough, unspecified: Secondary | ICD-10-CM

## 2014-06-27 DIAGNOSIS — I495 Sick sinus syndrome: Secondary | ICD-10-CM

## 2014-06-27 DIAGNOSIS — R0600 Dyspnea, unspecified: Secondary | ICD-10-CM

## 2014-06-27 DIAGNOSIS — R0609 Other forms of dyspnea: Secondary | ICD-10-CM

## 2014-06-27 DIAGNOSIS — R05 Cough: Secondary | ICD-10-CM

## 2014-06-27 DIAGNOSIS — R0989 Other specified symptoms and signs involving the circulatory and respiratory systems: Secondary | ICD-10-CM

## 2014-06-27 DIAGNOSIS — J4531 Mild persistent asthma with (acute) exacerbation: Secondary | ICD-10-CM

## 2014-06-27 DIAGNOSIS — R0602 Shortness of breath: Secondary | ICD-10-CM

## 2014-06-27 DIAGNOSIS — J45901 Unspecified asthma with (acute) exacerbation: Secondary | ICD-10-CM

## 2014-06-27 LAB — POCT CBC
Granulocyte percent: 85.6 %G — AB (ref 37–80)
HCT, POC: 53.6 % — AB (ref 37.7–47.9)
HEMOGLOBIN: 17.6 g/dL — AB (ref 12.2–16.2)
LYMPH, POC: 0.7 (ref 0.6–3.4)
MCH: 29 pg (ref 27–31.2)
MCHC: 32.8 g/dL (ref 31.8–35.4)
MCV: 88.4 fL (ref 80–97)
MID (cbc): 0.2 (ref 0–0.9)
MPV: 6.8 fL (ref 0–99.8)
POC GRANULOCYTE: 5.5 (ref 2–6.9)
POC LYMPH %: 11.1 % (ref 10–50)
POC MID %: 3.3 %M (ref 0–12)
Platelet Count, POC: 116 10*3/uL — AB (ref 142–424)
RBC: 6.06 M/uL — AB (ref 4.04–5.48)
RDW, POC: 13.2 %
WBC: 6.4 10*3/uL (ref 4.6–10.2)

## 2014-06-27 MED ORDER — PREDNISONE 20 MG PO TABS: 40.0000 mg | ORAL_TABLET | Freq: Every day | ORAL | Status: DC

## 2014-06-27 MED ORDER — ALBUTEROL SULFATE (2.5 MG/3ML) 0.083% IN NEBU
2.5000 mg | INHALATION_SOLUTION | Freq: Once | RESPIRATORY_TRACT | Status: AC
Start: 2014-06-27 — End: 2014-06-27
  Administered 2014-06-27: 2.5 mg via RESPIRATORY_TRACT

## 2014-06-27 MED ORDER — DOXYCYCLINE HYCLATE 100 MG PO TABS: 100.0000 mg | ORAL_TABLET | Freq: Two times a day (BID) | ORAL | Status: DC

## 2014-06-27 NOTE — Progress Notes (Signed)
Subjective:    Patient ID: [name] [name], female    DOB: 01-18-1956, 58 y.o.   MRN: [id number]  HPI [name] [name] is a 58 y.o. female PCP: Ellsworth Lennox, MD  Presents today with dyspnea. Trouble catching breath since late yesterday afternoon. Started with nasal discharge and cold symptoms. Has albuterol inhaler - uses 2 puffs twice per day routinely this time of year. Stopped Qvar few years ago. Just using albuterol.  Told had asthma, not COPD.  Similar flair last year. Did not take additional doses of albuterol - just 2 puffs last night and 2 this morning. No fever. Cough productive of discolored phlegm - white/green yellow. Dyspnea since 2:30 this am. No chest pain/heaviness, no new palpitations.  Uses CPAP. Last dose of albuterol at 7 am - min improvement for about 45 mins to an hour.   History of multiple medical problems including HTN, DM2 (controlled by A1c 6.5 in July), and diastolic heart failure. Followed by Dr. Caryl Comes with cardiology - hx of sinoatrial node dysfunction - has pacemaker (interrogated 09/2013 without changes). Per 09/2013 note from Dr. Caryl Comes - Myoview scanning was negative but was notable for significant nonsustained ventricular tachycardia. Left ventricular function was normal. This was performed in 01/2013. Pulmonary function testing for dyspnea in 03/2013 - mild obstructive disease with significant response to bronchodilator. (FVC 84-89, FEV1 - 79-86).    Patient Active Problem List   Diagnosis Date Noted  . Pacemaker Medtronic   . Ventricular tachycardia, nonsustained-during exercise testing 03/07/2013  . Syncope and collapse 10/19/2011  . Sinoatrial node dysfunction   . OSA (obstructive sleep apnea) 05/25/2011  . Hx of adenomatous colonic polyps 05/25/2011  . Migraine 05/25/2011  . DM2 (diabetes mellitus, type 2) 05/25/2011  . GERD (gastroesophageal reflux disease) 05/25/2011  . NAFLD (nonalcoholic fatty liver disease) 05/25/2011  . Hyperlipidemia 05/25/2011    . Severe essential hypertension 09/16/2009  . DIASTOLIC HEART FAILURE, CHRONIC 09/16/2009  . DYSPNEA 09/16/2009   Past Medical History  Diagnosis Date  . Fatty liver 04/2011  . Hypertension severe   . OSA (obstructive sleep apnea)     C-Pap  . Obesity   . Asthma   . Migraine   . Anal fissure   . Colon polyps     diverticulosis  03-2011/2005  . Diverticulosis   . GERD (gastroesophageal reflux disease)   . Hyperlipidemia   . Diabetes mellitus 2012    T2DM  . Sinoatrial node dysfunction     syncope  . Allergy   . Arthritis   . Depression   . Anxiety   . Pacemaker Medtronic   . Cataract 2010     left   Past Surgical History  Procedure Laterality Date  . Pacemaker placement    . Oophorectomy    . Tonsillectomy    . Eye surgery Right 2010  . Abdominal hysterectomy  2000    total  . T/a right knee     Allergies  Allergen Reactions  . Adhesive [Tape] Other (See Comments)    SKIN BLISTERS  . Penicillins   . Red Yeast Rice Hives  . Sulfa Drugs Cross Reactors Hives   Prior to Admission medications   Medication Sig Start Date End Date Taking? Authorizing Provider  albuterol (PROAIR HFA) 108 (90 BASE) MCG/ACT inhaler Inhale 2 puffs into the lungs as needed. 05/01/14  Yes Barton Fanny, MD  ALPRAZolam Duanne Moron) 0.25 MG tablet Take 1 tablet once a day prn anxiety. 05/01/14  Yes Barton Fanny,  MD  benazepril-hydrochlorthiazide (LOTENSIN HCT) 20-12.5 MG per tablet Take 1 tablet by mouth 2 (two) times daily. 10/22/13  Yes Barton Fanny, MD  butalbital-acetaminophen-caffeine (FIORICET, ESGIC) [phone] MG per tablet Take 1 tablet by mouth 2 (two) times daily as needed for headache. 11/22/13  Yes Barton Fanny, MD  cetirizine (ZYRTEC) 10 MG tablet Take 10 mg by mouth daily.     Yes Historical Provider, MD  Cholecalciferol (VITAMIN D) 2000 UNITS CAPS Take by mouth.     Yes Historical Provider, MD  citalopram (CELEXA) 20 MG tablet TAKE 1 BY MOUTH DAILY   Yes  Eleanore E Egan, PA-C  diclofenac (VOLTAREN) 75 MG EC tablet TAKE 1 BY MOUTH TWICE DAILY   Yes Eleanore E Egan, PA-C  fish oil-omega-3 fatty acids 1000 MG capsule Take 2 g by mouth daily. PER PATIENT TAKE 8000 MG DAILY   Yes Historical Provider, MD  fluticasone (FLONASE) 50 MCG/ACT nasal spray Place 2 sprays into both nostrils daily. 10/18/13 10/18/14 Yes Barton Fanny, MD  gabapentin (NEURONTIN) 300 MG capsule TAKE 1 BY MOUTH IN THE MORNING AND 2 AT BEDTIME   Yes Eleanore E Egan, PA-C  glimepiride (AMARYL) 4 MG tablet Take 1/2 tablet twice a day before main meals. 05/01/14  Yes Barton Fanny, MD  HYDROcodone-homatropine [facility]) 5-1.5 MG/5ML syrup Take 5 mLs by mouth every 4 (four) hours as needed for cough. 08/06/13  Yes Posey Boyer, MD  hydrOXYzine (ATARAX/VISTARIL) 25 MG tablet as needed. 08/24/13  Yes Historical Provider, MD  losartan (COZAAR) 100 MG tablet Take 1 tablet (100 mg total) by mouth daily. 08/22/13  Yes Barton Fanny, MD  metoprolol succinate (TOPROL-XL) 50 MG 24 hr tablet Take 2 tablets (100 mg total) by mouth daily. Take with or immediately following a meal. 09/27/13  Yes Barton Fanny, MD  Multiple Vitamin (MULTIVITAMIN) tablet Take 1 tablet by mouth daily.     Yes Historical Provider, MD  omeprazole (PRILOSEC) 20 MG capsule TAKE 1 BY MOUTH DAILY   Yes Eleanore E Egan, PA-C  ondansetron (ZOFRAN-ODT) 8 MG disintegrating tablet DISSOLVE 1 ON TONGUE EVERY 12 HOURS AS NEEDED FOR NAUSEA   Yes Barton Fanny, MD  triamcinolone (KENALOG) 0.1 % cream Apply 1 application topically as needed.     Yes Historical Provider, MD  zoster vaccine live, PF, (ZOSTAVAX) 74259 UNT/0.65ML injection Inject 19,400 Units into the skin once. 05/01/14  Yes Barton Fanny, MD  beclomethasone (QVAR) 80 MCG/ACT inhaler Inhale 2 puffs twice a day. Rinse after use. 05/01/14   Barton Fanny, MD   History   Social History  . Marital Status: Single    Spouse Name: N/A     Number of Children: 0  . Years of Education: N/A   Occupational History  . chef    Social History Main Topics  . Smoking status: Former Smoker    Types: Cigarettes    Quit date: 10/12/1991  . Smokeless tobacco: Never Used     Comment: Quit 20 yrs ago  . Alcohol Use: No  . Drug Use: No  . Sexual Activity: Yes    Birth Control/ Protection: Post-menopausal, Other-see comments     Comment: Monogamous    Other Topics Concern  . Not on file   Social History Narrative   Single. Exercise: No. Education: College.       Review of Systems  Constitutional: Negative for fever and chills.  HENT: Positive for congestion and rhinorrhea.  Respiratory: Positive for cough, shortness of breath and wheezing. Negative for chest tightness.   Cardiovascular: Negative for chest pain, palpitations and leg swelling.  Skin: Negative for rash.  Neurological: Negative for syncope.       Objective:   Physical Exam  Vitals reviewed. Constitutional: She is oriented to person, place, and time. She appears well-developed and well-nourished. No distress.  HENT:  Head: Normocephalic and atraumatic.  Right Ear: Hearing, tympanic membrane, external ear and ear canal normal.  Left Ear: Hearing, tympanic membrane, external ear and ear canal normal.  Nose: Nose normal.  Mouth/Throat: Oropharynx is clear and moist. No oropharyngeal exudate.  Eyes: Conjunctivae and EOM are normal. Pupils are equal, round, and reactive to light.  Cardiovascular: Normal rate, regular rhythm, normal heart sounds and intact distal pulses.   No murmur heard. Pulmonary/Chest: Effort normal. No accessory muscle usage. Not tachypneic. No respiratory distress. She has decreased breath sounds (no apparent wheeze, but distant, and few coarse bs inferiorly. ).  Neurological: She is alert and oriented to person, place, and time.  Skin: Skin is warm and dry. No rash noted.  Psychiatric: She has a normal mood and affect. Her behavior is  normal.    EKG: sr, low voltage in limb leads. No acute findings.  [facility] reading (PRIMARY) by  Dr. Carlota Raspberry: CXR: pacemaker in place, increased bronchitic markings without discrete infiltrate.    Results for orders placed in visit on 06/27/14  POCT CBC      Result Value Ref Range   WBC 6.4  4.6 - 10.2 K/uL   Lymph, poc 0.7  0.6 - 3.4   POC LYMPH PERCENT 11.1  10 - 50 %L   MID (cbc) 0.2  0 - 0.9   POC MID % 3.3  0 - 12 %M   POC Granulocyte 5.5  2 - 6.9   Granulocyte percent 85.6 (*) 37 - 80 %G   RBC 6.06 (*) 4.04 - 5.48 M/uL   Hemoglobin 17.6 (*) 12.2 - 16.2 g/dL   HCT, POC 53.6 (*) 37.7 - 47.9 %   MCV 88.4  80 - 97 fL   MCH, POC 29.0  27 - 31.2 pg   MCHC 32.8  31.8 - 35.4 g/dL   RDW, POC 13.2     Platelet Count, POC 116 (*) 142 - 424 K/uL   MPV 6.8  0 - 99.8 fL   Repeat exam - expiratory wheezes. Albuterol neb 2.5mg  x 1.  Improved aeration after neb, few expiratory wheezes.      Assessment & Plan:   Shakeisha Horine is a 58 y.o. female Dyspnea - Plan: Brain natriuretic peptide, POCT CBC, albuterol (PROVENTIL) (2.5 MG/3ML) 0.083% nebulizer solution 2.5 mg, CANCELED: CBC  Sinoatrial node dysfunction - Plan: Brain natriuretic peptide, EKG 12-Lead, POCT CBC, CANCELED: CBC  Cough - Plan: Brain natriuretic peptide, DG Chest 2 View, POCT CBC, albuterol (PROVENTIL) (2.5 MG/3ML) 0.083% nebulizer solution 2.5 mg, doxycycline (VIBRA-TABS) 100 MG tablet, CANCELED: CBC  Shortness of breath - Plan: Brain natriuretic peptide, DG Chest 2 View, POCT CBC, albuterol (PROVENTIL) (2.5 MG/3ML) 0.083% nebulizer solution 2.5 mg, predniSONE (DELTASONE) 20 MG tablet, doxycycline (VIBRA-TABS) 100 MG tablet, CANCELED: CBC  Asthmatic bronchitis, mild persistent, with acute exacerbation - Plan: predniSONE (DELTASONE) 20 MG tablet, doxycycline (VIBRA-TABS) 100 MG tablet  Suspected initial viral URI, then to asthmatic bronchitis, with discolored phlegm. Cardiac hx noted, but does not appear to be in fluid  overload. BNP pending. EKG unchanged.   -improved with  albuterol neb - some residual wheeze, but she wanted to treat at home with albuterol inh - 2 puffs q4h prn.   -if persistent wheeze tonight - start prednisone - SED, and to watch CBG's as diabetic, but good control recently.   -start doxycycline (contraindication with celexa and azithro - so changed to doxy).   -RTC/ER precautions discussed.   Meds ordered this encounter  Medications  . albuterol (PROVENTIL) (2.5 MG/3ML) 0.083% nebulizer solution 2.5 mg    Sig:   . predniSONE (DELTASONE) 20 MG tablet    Sig: Take 2 tablets (40 mg total) by mouth daily with breakfast.    Dispense:  10 tablet    Refill:  0  . doxycycline (VIBRA-TABS) 100 MG tablet    Sig: Take 1 tablet (100 mg total) by mouth 2 (two) times daily.    Dispense:  20 tablet    Refill:  0   Patient Instructions  Start doxycycline, albuterol inhaler 1-2 puffs ever 4 hours for wheezing.  If wheezing not improved tonight, or frequent use of albuterol needed,  then start prednisone.  If wheezing or short of breath prior to next dose of albuterol - return here or emergency room.  You should receive a call or letter about your lab results within the next few days.  Return to the clinic or go to the nearest emergency room if any of your symptoms worsen or new symptoms occur. Asthma, Acute Bronchospasm Acute bronchospasm caused by asthma is also referred to as an asthma attack. Bronchospasm means your air passages become narrowed. The narrowing is caused by inflammation and tightening of the muscles in the air tubes (bronchi) in your lungs. This can make it hard to breathe or cause you to wheeze and cough. CAUSES Possible triggers are:  Animal dander from the skin, hair, or feathers of animals.  Dust mites contained in house dust.  Cockroaches.  Pollen from trees or grass.  Mold.  Cigarette or tobacco smoke.  Air pollutants such as dust, household cleaners, hair sprays,  aerosol sprays, paint fumes, strong chemicals, or strong odors.  Cold air or weather changes. Cold air may trigger inflammation. Winds increase molds and pollens in the air.  Strong emotions such as crying or laughing hard.  Stress.  Certain medicines such as aspirin or beta-blockers.  Sulfites in foods and drinks, such as dried fruits and wine.  Infections or inflammatory conditions, such as a flu, cold, or inflammation of the nasal membranes (rhinitis).  Gastroesophageal reflux disease (GERD). GERD is a condition where stomach acid backs up into your esophagus.  Exercise or strenuous activity. SIGNS AND SYMPTOMS   Wheezing.  Excessive coughing, particularly at night.  Chest tightness.  Shortness of breath. DIAGNOSIS  Your health care provider will ask you about your medical history and perform a physical exam. A chest X-ray or blood testing may be performed to look for other causes of your symptoms or other conditions that may have triggered your asthma attack. TREATMENT  Treatment is aimed at reducing inflammation and opening up the airways in your lungs. Most asthma attacks are treated with inhaled medicines. These include quick relief or rescue medicines (such as bronchodilators) and controller medicines (such as inhaled corticosteroids). These medicines are sometimes given through an inhaler or a nebulizer. Systemic steroid medicine taken by mouth or given through an IV tube also can be used to reduce the inflammation when an attack is moderate or severe. Antibiotic medicines are only used if a bacterial infection  is present.  HOME CARE INSTRUCTIONS   Rest.  Drink plenty of liquids. This helps the mucus to remain thin and be easily coughed up. Only use caffeine in moderation and do not use alcohol until you have recovered from your illness.  Do not smoke. Avoid being exposed to secondhand smoke.  You play a critical role in keeping yourself in good health. Avoid exposure  to things that cause you to wheeze or to have breathing problems.  Keep your medicines up-to-date and available. Carefully follow your health care provider's treatment plan.  Take your medicine exactly as prescribed.  When pollen or pollution is bad, keep windows closed and use an air conditioner or go to places with air conditioning.  Asthma requires careful medical care. See your health care provider for a follow-up as advised. If you are more than [redacted] weeks pregnant and you were prescribed any new medicines, let your obstetrician know about the visit and how you are doing. Follow up with your health care provider as directed.  After you have recovered from your asthma attack, make an appointment with your outpatient doctor to talk about ways to reduce the likelihood of future attacks. If you do not have a doctor who manages your asthma, make an appointment with a primary care doctor to discuss your asthma. SEEK IMMEDIATE MEDICAL CARE IF:   You are getting worse.  You have trouble breathing. If severe, call your local emergency services (911 in the [place]  You develop chest pain or discomfort.  You are vomiting.  You are not able to keep fluids down.  You are coughing up yellow, green, brown, or bloody sputum.  You have a fever and your symptoms suddenly get worse.  You have trouble swallowing. MAKE SURE YOU:   Understand these instructions.  Will watch your condition.  Will get help right away if you are not doing well or get worse. Document Released: 01/12/2007 Document Revised: 10/02/2013 Document Reviewed: 04/04/2013 [facility] Patient Information 2015 [place], [place]. This information is not intended to replace advice given to you by your health care provider. Make sure you discuss any questions you have with your health care provider.   Shortness of Breath Shortness of breath means you have trouble breathing. It could also mean that you have a medical problem. You should  get immediate medical care for shortness of breath. CAUSES   Not enough oxygen in the air such as with high altitudes or a smoke-filled room.  Certain lung diseases, infections, or problems.  Heart disease or conditions, such as angina or heart failure.  Low red blood cells (anemia).  Poor physical fitness, which can cause shortness of breath when you exercise.  Chest or back injuries or stiffness.  Being overweight.  Smoking.  Anxiety, which can make you feel like you are not getting enough air. DIAGNOSIS  Serious medical problems can often be found during your physical exam. Tests may also be done to determine why you are having shortness of breath. Tests may include:  Chest X-rays.  Lung function tests.  Blood tests.  An electrocardiogram (ECG).  An ambulatory electrocardiogram. An ambulatory ECG records your heartbeat patterns over a 24-hour period.  Exercise testing.  A transthoracic echocardiogram (TTE). During echocardiography, sound waves are used to evaluate how blood flows through your heart.  A transesophageal echocardiogram (TEE).  Imaging scans. Your health care provider may not be able to find a cause for your shortness of breath after your exam. In this case, it  is important to have a follow-up exam with your health care provider as directed.  TREATMENT  Treatment for shortness of breath depends on the cause of your symptoms and can vary greatly. HOME CARE INSTRUCTIONS   Do not smoke. Smoking is a common cause of shortness of breath. If you smoke, ask for help to quit.  Avoid being around chemicals or things that may bother your breathing, such as paint fumes and dust.  Rest as needed. Slowly resume your usual activities.  If medicines were prescribed, take them as directed for the full length of time directed. This includes oxygen and any inhaled medicines.  Keep all follow-up appointments as directed by your health care provider. SEEK MEDICAL  CARE IF:   Your condition does not improve in the time expected.  You have a hard time doing your normal activities even with rest.  You have any new symptoms. SEEK IMMEDIATE MEDICAL CARE IF:   Your shortness of breath gets worse.  You feel light-headed, faint, or develop a cough not controlled with medicines.  You start coughing up blood.  You have pain with breathing.  You have chest pain or pain in your arms, shoulders, or abdomen.  You have a fever.  You are unable to walk up stairs or exercise the way you normally do. MAKE SURE YOU:  Understand these instructions.  Will watch your condition.  Will get help right away if you are not doing well or get worse. Document Released: 06/22/2001 Document Revised: 10/02/2013 Document Reviewed: 12/13/2011 [facility] Patient Information 2015 [place], [place]. This information is not intended to replace advice given to you by your health care provider. Make sure you discuss any questions you have with your health care provider.

## 2014-06-27 NOTE — Patient Instructions (Signed)
Start doxycycline, albuterol inhaler 1-2 puffs ever 4 hours for wheezing.  If wheezing not improved tonight, or frequent use of albuterol needed,  then start prednisone.  If wheezing or short of breath prior to next dose of albuterol - return here or emergency room.  You should receive a call or letter about your lab results within the next few days.  Return to the clinic or go to the nearest emergency room if any of your symptoms worsen or new symptoms occur. Asthma, Acute Bronchospasm Acute bronchospasm caused by asthma is also referred to as an asthma attack. Bronchospasm means your air passages become narrowed. The narrowing is caused by inflammation and tightening of the muscles in the air tubes (bronchi) in your lungs. This can make it hard to breathe or cause you to wheeze and cough. CAUSES Possible triggers are:  Animal dander from the skin, hair, or feathers of animals.  Dust mites contained in house dust.  Cockroaches.  Pollen from trees or grass.  Mold.  Cigarette or tobacco smoke.  Air pollutants such as dust, household cleaners, hair sprays, aerosol sprays, paint fumes, strong chemicals, or strong odors.  Cold air or weather changes. Cold air may trigger inflammation. Winds increase molds and pollens in the air.  Strong emotions such as crying or laughing hard.  Stress.  Certain medicines such as aspirin or beta-blockers.  Sulfites in foods and drinks, such as dried fruits and wine.  Infections or inflammatory conditions, such as a flu, cold, or inflammation of the nasal membranes (rhinitis).  Gastroesophageal reflux disease (GERD). GERD is a condition where stomach acid backs up into your esophagus.  Exercise or strenuous activity. SIGNS AND SYMPTOMS   Wheezing.  Excessive coughing, particularly at night.  Chest tightness.  Shortness of breath. DIAGNOSIS  Your health care provider will ask you about your medical history and perform a physical exam. A  chest X-ray or blood testing may be performed to look for other causes of your symptoms or other conditions that may have triggered your asthma attack. TREATMENT  Treatment is aimed at reducing inflammation and opening up the airways in your lungs. Most asthma attacks are treated with inhaled medicines. These include quick relief or rescue medicines (such as bronchodilators) and controller medicines (such as inhaled corticosteroids). These medicines are sometimes given through an inhaler or a nebulizer. Systemic steroid medicine taken by mouth or given through an IV tube also can be used to reduce the inflammation when an attack is moderate or severe. Antibiotic medicines are only used if a bacterial infection is present.  HOME CARE INSTRUCTIONS   Rest.  Drink plenty of liquids. This helps the mucus to remain thin and be easily coughed up. Only use caffeine in moderation and do not use alcohol until you have recovered from your illness.  Do not smoke. Avoid being exposed to secondhand smoke.  You play a critical role in keeping yourself in good health. Avoid exposure to things that cause you to wheeze or to have breathing problems.  Keep your medicines up-to-date and available. Carefully follow your health care provider's treatment plan.  Take your medicine exactly as prescribed.  When pollen or pollution is bad, keep windows closed and use an air conditioner or go to places with air conditioning.  Asthma requires careful medical care. See your health care provider for a follow-up as advised. If you are more than [redacted] weeks pregnant and you were prescribed any new medicines, let your obstetrician know about the visit  and how you are doing. Follow up with your health care provider as directed.  After you have recovered from your asthma attack, make an appointment with your outpatient doctor to talk about ways to reduce the likelihood of future attacks. If you do not have a doctor who manages your  asthma, make an appointment with a primary care doctor to discuss your asthma. SEEK IMMEDIATE MEDICAL CARE IF:   You are getting worse.  You have trouble breathing. If severe, call your local emergency services (911 in the [place]  You develop chest pain or discomfort.  You are vomiting.  You are not able to keep fluids down.  You are coughing up yellow, green, brown, or bloody sputum.  You have a fever and your symptoms suddenly get worse.  You have trouble swallowing. MAKE SURE YOU:   Understand these instructions.  Will watch your condition.  Will get help right away if you are not doing well or get worse. Document Released: 01/12/2007 Document Revised: 10/02/2013 Document Reviewed: 04/04/2013 [facility] Patient Information 2015 [place], [place]. This information is not intended to replace advice given to you by your health care provider. Make sure you discuss any questions you have with your health care provider.   Shortness of Breath Shortness of breath means you have trouble breathing. It could also mean that you have a medical problem. You should get immediate medical care for shortness of breath. CAUSES   Not enough oxygen in the air such as with high altitudes or a smoke-filled room.  Certain lung diseases, infections, or problems.  Heart disease or conditions, such as angina or heart failure.  Low red blood cells (anemia).  Poor physical fitness, which can cause shortness of breath when you exercise.  Chest or back injuries or stiffness.  Being overweight.  Smoking.  Anxiety, which can make you feel like you are not getting enough air. DIAGNOSIS  Serious medical problems can often be found during your physical exam. Tests may also be done to determine why you are having shortness of breath. Tests may include:  Chest X-rays.  Lung function tests.  Blood tests.  An electrocardiogram (ECG).  An ambulatory electrocardiogram. An ambulatory ECG records your  heartbeat patterns over a 24-hour period.  Exercise testing.  A transthoracic echocardiogram (TTE). During echocardiography, sound waves are used to evaluate how blood flows through your heart.  A transesophageal echocardiogram (TEE).  Imaging scans. Your health care provider may not be able to find a cause for your shortness of breath after your exam. In this case, it is important to have a follow-up exam with your health care provider as directed.  TREATMENT  Treatment for shortness of breath depends on the cause of your symptoms and can vary greatly. HOME CARE INSTRUCTIONS   Do not smoke. Smoking is a common cause of shortness of breath. If you smoke, ask for help to quit.  Avoid being around chemicals or things that may bother your breathing, such as paint fumes and dust.  Rest as needed. Slowly resume your usual activities.  If medicines were prescribed, take them as directed for the full length of time directed. This includes oxygen and any inhaled medicines.  Keep all follow-up appointments as directed by your health care provider. SEEK MEDICAL CARE IF:   Your condition does not improve in the time expected.  You have a hard time doing your normal activities even with rest.  You have any new symptoms. SEEK IMMEDIATE MEDICAL CARE IF:  Your shortness of breath gets worse.  You feel light-headed, faint, or develop a cough not controlled with medicines.  You start coughing up blood.  You have pain with breathing.  You have chest pain or pain in your arms, shoulders, or abdomen.  You have a fever.  You are unable to walk up stairs or exercise the way you normally do. MAKE SURE YOU:  Understand these instructions.  Will watch your condition.  Will get help right away if you are not doing well or get worse. Document Released: 06/22/2001 Document Revised: 10/02/2013 Document Reviewed: 12/13/2011 [facility] Patient Information 2015 [place], [place]. This information  is not intended to replace advice given to you by your health care provider. Make sure you discuss any questions you have with your health care provider.

## 2014-06-28 LAB — BRAIN NATRIURETIC PEPTIDE: Brain Natriuretic Peptide: 196.3 pg/mL — ABNORMAL HIGH (ref 0.0–100.0)

## 2014-07-02 ENCOUNTER — Ambulatory Visit (INDEPENDENT_AMBULATORY_CARE_PROVIDER_SITE_OTHER): Payer: BC Managed Care – PPO

## 2014-07-02 VITALS — BP 147/80 | HR 67 | Resp 12

## 2014-07-02 DIAGNOSIS — R52 Pain, unspecified: Secondary | ICD-10-CM

## 2014-07-02 DIAGNOSIS — M7730 Calcaneal spur, unspecified foot: Secondary | ICD-10-CM

## 2014-07-02 DIAGNOSIS — M722 Plantar fascial fibromatosis: Secondary | ICD-10-CM

## 2014-07-02 NOTE — Progress Notes (Signed)
   Subjective:    Patient ID: [name] [name], female    DOB: Oct 02, 1956, 58 y.o.   MRN: [id number]  HPI  PT STATED RT FOOT BOTTOM OF THE HEEL AND BEEN PAINFUL FOR 3 WEEKS. THE HEEL IS CONSTANT PAINFUL AND BEEN THE SAME. THE FOOT GET AGGRAVATED FIRST STEP IN THE MORNING. TRIED ICE, SOAKING, AND ELEVATED IT HELP TEMPORARY.  Review of Systems  Respiratory: Positive for cough, choking, chest tightness, shortness of breath and wheezing.   Musculoskeletal: Positive for gait problem.  Allergic/Immunologic: Positive for environmental allergies.  All other systems reviewed and are negative.      Objective:   Physical Exam 89-year-old white female well-developed well-nourished oriented x3 presents at this time with a three-week complaints history of pain in her right heel she continues to the right in her driveway however is definitely having pain first up in the morning or getting up after a period of  rest. Seems to gotten somewhat better a shoes and 40 mg of prednisone currently  Partially objective findings reveal vascular status to be intact pedal pulses palpable DP +2/4 bilateral PT one over 4 bilateral capillary refill time 3 seconds all digits epicritic and proprioceptive sensations intact and symmetric bilateral there is normal plantar response DTRs not elicited in the some decreased sensation plantar foot. Dermatologic the skin color pigment normal hair growth absent nails otherwise unremarkable no open wounds or ulcers no secondary infections. Orthopedic biomechanical exam reveals pain on palpation medial band high-fashion medial trochanter tubercle right as opposed left no posterior calcaneal pain x-rays reveal well-developed inferior retrocalcaneal spurring mild fascial thickening no signs of fracture or other osseous abnormalities noted       Assessment & Plan:  Assessment plantar fasciitis/heel spur syndrome pain first up in the morning following a period of rest were after activities  recommended this time patient continue with her steroid which is currently taking for respiratory issues likely continue to help her heel fascia strapping applied to the foot although there is an history of allergy to adhesive tape is plastic tape and Band-Aid cloth tape has not been an issue at this time we'll do a trial if there's any irritation discontinue medially use Benadryl as instructed. Fascial strapping to be applied maintain for least 5 days as instructed recommended, or Advil as needed for pain she currently taking Voltaren as well as the Sterapred pack to patient be recheck in 2 weeks for followup may be candidate for orthoses or arch support in the future maintain shoes at all times no barefoot or flimsy shoes or flip-flops  Harriet Masson DPM

## 2014-07-02 NOTE — Patient Instructions (Signed)
ICE INSTRUCTIONS  Apply ice or cold pack to the affected area at least 3 times a day for 10-15 minutes each time.  You should also use ice after prolonged activity or vigorous exercise.  Do not apply ice longer than 20 minutes at one time.  Always keep a cloth between your skin and the ice pack to prevent burns.  Being consistent and following these instructions will help control your symptoms.  We suggest you purchase a gel ice pack because they are reusable and do bit leak.  Some of them are designed to wrap around the area.  Use the method that works best for you.  Here are some other suggestions for icing.   Use a frozen bag of peas or corn-inexpensive and molds well to your body, usually stays frozen for 10 to 20 minutes.  Wet a towel with cold water and squeeze out the excess until it's damp.  Place in a bag in the freezer for 20 minutes. Then remove and use.   Plantar Fasciitis Plantar fasciitis is a common condition that causes foot pain. It is soreness (inflammation) of the band of tough fibrous tissue on the bottom of the foot that runs from the heel bone (calcaneus) to the ball of the foot. The cause of this soreness may be from excessive standing, poor fitting shoes, running on hard surfaces, being overweight, having an abnormal walk, or overuse (this is common in runners) of the painful foot or feet. It is also common in aerobic exercise dancers and ballet dancers. SYMPTOMS  Most people with plantar fasciitis complain of:  Severe pain in the morning on the bottom of their foot especially when taking the first steps out of bed. This pain recedes after a few minutes of walking.  Severe pain is experienced also during walking following a long period of inactivity.  Pain is worse when walking barefoot or up stairs DIAGNOSIS   Your caregiver will diagnose this condition by examining and feeling your foot.  Special tests such as X-rays of your foot, are usually not needed. PREVENTION     Consult a sports medicine professional before beginning a new exercise program.  Walking programs offer a good workout. With walking there is a lower chance of overuse injuries common to runners. There is less impact and less jarring of the joints.  Begin all new exercise programs slowly. If problems or pain develop, decrease the amount of time or distance until you are at a comfortable level.  Wear good shoes and replace them regularly.  Stretch your foot and the heel cords at the back of the ankle (Achilles tendon) both before and after exercise.  Run or exercise on even surfaces that are not hard. For example, asphalt is better than pavement.  Do not run barefoot on hard surfaces.  If using a treadmill, vary the incline.  Do not continue to workout if you have foot or joint problems. Seek professional help if they do not improve. HOME CARE INSTRUCTIONS   Avoid activities that cause you pain until you recover.  Use ice or cold packs on the problem or painful areas after working out.  Only take over-the-counter or prescription medicines for pain, discomfort, or fever as directed by your caregiver.  Soft shoe inserts or athletic shoes with air or gel sole cushions may be helpful.  If problems continue or become more severe, consult a sports medicine caregiver or your own health care provider. Cortisone is a potent anti-inflammatory medication that may   be injected into the painful area. You can discuss this treatment with your caregiver. MAKE SURE YOU:   Understand these instructions.  Will watch your condition.  Will get help right away if you are not doing well or get worse. Document Released: 06/22/2001 Document Revised: 12/20/2011 Document Reviewed: 08/21/2008 [employer] Patient Information 2015 [employer]. This information is not intended to replace advice given to you by your health care provider. Make sure you discuss any questions you have with your health care  provider.  

## 2014-07-03 ENCOUNTER — Other Ambulatory Visit: Payer: Self-pay

## 2014-07-03 ENCOUNTER — Telehealth: Payer: Self-pay

## 2014-07-03 ENCOUNTER — Telehealth: Payer: Self-pay | Admitting: Cardiology

## 2014-07-03 NOTE — Telephone Encounter (Signed)
Advised patient to f/u with her PCP -- she tells me she has already done this and they told her to see cardiology. She did have a BNP drawn at PCP with result of 196 (per her statement). Inhalers are not working, PCP does not think asthma/allergy related. Explained that being she has already seen PCP who recommends cardiology I will send to Dr. Caryl Comes for recommendations, possible office visit. She is thankful for the help and will wait for response.   (Dr. Caryl Comes - would you like me to schedule office visit - [facility]??) (I asked her if she has seen pulmonology - she states no)

## 2014-07-03 NOTE — Telephone Encounter (Signed)
New message           C/o sob

## 2014-07-03 NOTE — Telephone Encounter (Signed)
Patient calling to inform us of symptoms she is having. Pt st she has been having mild SOB since January. Recent bout of bronchitis made SOB worse. (Patient is still on abx and prednisone for bronchitis) Patient st that she cannot walk across the room without having SOB. She st that her HR also increases when her SOB occurs, but goes down again once she is calm. 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 Pt st she has been taking medications as directed and she has no swelling or weight gain. Patient wants to know if any of her cardiac meds should be changed.  Instructed patient that worsening symptoms are most likely lung related, but am forwarding her summary to MD for review.

## 2014-07-08 ENCOUNTER — Ambulatory Visit (INDEPENDENT_AMBULATORY_CARE_PROVIDER_SITE_OTHER): Payer: BC Managed Care – PPO | Admitting: *Deleted

## 2014-07-08 ENCOUNTER — Telehealth: Payer: Self-pay | Admitting: Cardiology

## 2014-07-08 DIAGNOSIS — I495 Sick sinus syndrome: Secondary | ICD-10-CM

## 2014-07-08 NOTE — Telephone Encounter (Signed)
Spoke with pt and reminded pt of remote transmission that is due today. Pt verbalized understanding.   

## 2014-07-09 NOTE — Progress Notes (Signed)
Remote pacemaker transmission.   

## 2014-07-11 LAB — MDC_IDC_ENUM_SESS_TYPE_REMOTE
Battery Impedance: 250 Ohm
Brady Statistic AP VP Percent: 0 %
Brady Statistic AP VS Percent: 0 %
Brady Statistic AS VP Percent: 0 %
Brady Statistic AS VS Percent: 100 %
Date Time Interrogation Session: 20150928230849
Lead Channel Impedance Value: 413 Ohm
Lead Channel Impedance Value: 525 Ohm
Lead Channel Pacing Threshold Amplitude: 0.625 V
Lead Channel Pacing Threshold Pulse Width: 0.4 ms
Lead Channel Sensing Intrinsic Amplitude: 1 mV
Lead Channel Sensing Intrinsic Amplitude: 8 mV
MDC IDC MSMT BATTERY REMAINING LONGEVITY: 132 mo
MDC IDC MSMT BATTERY VOLTAGE: 2.78 V
MDC IDC MSMT LEADCHNL RA PACING THRESHOLD AMPLITUDE: 0.5 V
MDC IDC MSMT LEADCHNL RV PACING THRESHOLD PULSEWIDTH: 0.4 ms
MDC IDC SET LEADCHNL RA PACING AMPLITUDE: 2 V
MDC IDC SET LEADCHNL RV PACING AMPLITUDE: 2.5 V
MDC IDC SET LEADCHNL RV PACING PULSEWIDTH: 0.4 ms
MDC IDC SET LEADCHNL RV SENSING SENSITIVITY: 4 mV

## 2014-07-16 ENCOUNTER — Ambulatory Visit (INDEPENDENT_AMBULATORY_CARE_PROVIDER_SITE_OTHER): Payer: BC Managed Care – PPO

## 2014-07-16 VITALS — BP 174/95 | HR 67 | Resp 16

## 2014-07-16 DIAGNOSIS — M7730 Calcaneal spur, unspecified foot: Secondary | ICD-10-CM

## 2014-07-16 DIAGNOSIS — M722 Plantar fascial fibromatosis: Secondary | ICD-10-CM

## 2014-07-16 DIAGNOSIS — R52 Pain, unspecified: Secondary | ICD-10-CM

## 2014-07-16 NOTE — Progress Notes (Signed)
   Subjective:    Patient ID: [name] [name], female    DOB: 06-25-1956, 58 y.o.   MRN: [id number]  HPI PF recheck right foot, some improvement in pain, pain worsens with prolonged walking   Review of Systems no new findings or systemic changes noted     Objective:   Physical Exam Neurovascular status is intact with pedal pulses palpable DP +2 PT plus one over 4 capillary refill time 3 seconds. Patient did have improvement with a fascial strapping and previous steroid injections the taping for better well was in place right foot still has some improvement although not completely resolved. No open wounds or ulcers no secondary infections.       Assessment & Plan:  Assessment plantar fasciitis responding to biomechanical support a fascial strapping the right foot base and affect we'll schedule orthotic scan is taken at this time place patient is functional orthoses of Swartley type orthotic refurbish 3 full contact cast the foot patient will be placed in the extrinsic rear foot post Spenco top cover. Patient will be followed with in 3 or 4 weeks orthotics are ready for fitting and dispensing. In the interim maintain a stable shoe maintain NSAID as needed and ice to the heel as needed until followup orthotic dispensed  Harriet Masson DPM

## 2014-07-16 NOTE — Patient Instructions (Signed)

## 2014-07-26 NOTE — Telephone Encounter (Signed)
Called patient to see if she ever spoke with Dr. Caryl Comes and she states she has not. She states she is doing better. SOB has resolved post bronchitis. (she sounds much better) She was appreciative for the follow up.

## 2014-07-30 ENCOUNTER — Encounter: Payer: Self-pay | Admitting: Cardiology

## 2014-08-09 ENCOUNTER — Ambulatory Visit: Payer: BC Managed Care – PPO

## 2014-08-09 DIAGNOSIS — M722 Plantar fascial fibromatosis: Secondary | ICD-10-CM

## 2014-08-09 NOTE — Progress Notes (Signed)
Pt is here to PUO 

## 2014-08-09 NOTE — Patient Instructions (Signed)

## 2014-08-16 ENCOUNTER — Encounter: Payer: Self-pay | Admitting: Internal Medicine

## 2014-09-04 ENCOUNTER — Ambulatory Visit: Payer: BC Managed Care – PPO | Admitting: Family Medicine

## 2014-09-10 ENCOUNTER — Encounter: Payer: Self-pay | Admitting: Family Medicine

## 2014-09-10 ENCOUNTER — Ambulatory Visit (INDEPENDENT_AMBULATORY_CARE_PROVIDER_SITE_OTHER): Payer: BC Managed Care – PPO

## 2014-09-10 ENCOUNTER — Ambulatory Visit (INDEPENDENT_AMBULATORY_CARE_PROVIDER_SITE_OTHER): Payer: BC Managed Care – PPO | Admitting: Family Medicine

## 2014-09-10 VITALS — BP 181/86 | HR 72 | Temp 98.1°F | Resp 16 | Ht 62.0 in | Wt 232.0 lb

## 2014-09-10 DIAGNOSIS — I495 Sick sinus syndrome: Secondary | ICD-10-CM

## 2014-09-10 DIAGNOSIS — Z760 Encounter for issue of repeat prescription: Secondary | ICD-10-CM

## 2014-09-10 DIAGNOSIS — R202 Paresthesia of skin: Principal | ICD-10-CM

## 2014-09-10 DIAGNOSIS — R200 Anesthesia of skin: Secondary | ICD-10-CM

## 2014-09-10 DIAGNOSIS — I10 Essential (primary) hypertension: Secondary | ICD-10-CM

## 2014-09-10 DIAGNOSIS — Z23 Encounter for immunization: Secondary | ICD-10-CM

## 2014-09-10 MED ORDER — GABAPENTIN 300 MG PO CAPS: ORAL_CAPSULE | ORAL | Status: DC

## 2014-09-10 MED ORDER — DICLOFENAC SODIUM 75 MG PO TBEC: DELAYED_RELEASE_TABLET | ORAL | Status: DC

## 2014-09-10 MED ORDER — METOPROLOL SUCCINATE ER 50 MG PO TB24: 100.0000 mg | ORAL_TABLET | Freq: Every day | ORAL | Status: DC

## 2014-09-10 MED ORDER — OMEPRAZOLE 20 MG PO CPDR: DELAYED_RELEASE_CAPSULE | ORAL | Status: DC

## 2014-09-10 MED ORDER — CITALOPRAM HYDROBROMIDE 20 MG PO TABS: ORAL_TABLET | ORAL | Status: DC

## 2014-09-10 MED ORDER — ONDANSETRON 8 MG PO TBDP: ORAL_TABLET | ORAL | Status: DC

## 2014-09-10 MED ORDER — LOSARTAN POTASSIUM 100 MG PO TABS: 100.0000 mg | ORAL_TABLET | Freq: Every day | ORAL | Status: DC

## 2014-09-10 MED ORDER — BENAZEPRIL-HYDROCHLOROTHIAZIDE 20-12.5 MG PO TABS: 1.0000 | ORAL_TABLET | Freq: Two times a day (BID) | ORAL | Status: DC

## 2014-09-10 NOTE — Progress Notes (Signed)
Subjective:    Patient ID: [name] [name], female    DOB: 11-14-1955, 58 y.o.   MRN: [id number]  HPI  This 58 y.o. Cauc female is here for HTN follow-up and medication refills. Pt reports compliance with medications and BP readings at home "are good". She denies CP or tightness, palpitations, HA, dizziness or lightheadedness. Pt reports increased exercise since getting inserts for shoes that reduce foot pain; she has mild SOB/DOE but is getting at least 10,000 steps most days of the week. Pt is actively working on weight loss. She needs refill for Voltaren; without it, she has increased joint pain. Pt c/o intermittent tingling in her R hand; she reports waking up in the morning w/ numbness and tingling. She is R-handed but is not doing a lot of keyboard work with her job. She has sensation of tingling while driving; she shakes her arm/hand to reduce abnormal sensation.  Patient Active Problem List   Diagnosis Date Noted  . Pacemaker Medtronic   . Ventricular tachycardia, nonsustained-during exercise testing 03/07/2013  . Syncope and collapse 10/19/2011  . Sinoatrial node dysfunction   . OSA (obstructive sleep apnea) 05/25/2011  . Hx of adenomatous colonic polyps 05/25/2011  . Migraine 05/25/2011  . DM2 (diabetes mellitus, type 2) 05/25/2011  . GERD (gastroesophageal reflux disease) 05/25/2011  . NAFLD (nonalcoholic fatty liver disease) 05/25/2011  . Hyperlipidemia 05/25/2011  . Severe essential hypertension 09/16/2009  . DIASTOLIC HEART FAILURE, CHRONIC 09/16/2009  . DYSPNEA 09/16/2009    Prior to Admission medications   Medication Sig Start Date End Date Taking? Authorizing Provider  albuterol (PROAIR HFA) 108 (90 BASE) MCG/ACT inhaler Inhale 2 puffs into the lungs as needed. 05/01/14  Yes Barton Fanny, MD  ALPRAZolam Duanne Moron) 0.25 MG tablet Take 1 tablet once a day prn anxiety. 05/01/14  Yes Barton Fanny, MD  beclomethasone (QVAR) 80 MCG/ACT inhaler Inhale 2 puffs twice a  day. Rinse after use. 05/01/14  Yes Barton Fanny, MD  benazepril-hydrochlorthiazide (LOTENSIN HCT) 20-12.5 MG per tablet Take 1 tablet by mouth 2 (two) times daily. 10/22/13  Yes Barton Fanny, MD  butalbital-acetaminophen-caffeine (FIORICET, ESGIC) [phone] MG per tablet Take 1 tablet by mouth 2 (two) times daily as needed for headache. 11/22/13  Yes Barton Fanny, MD  cetirizine (ZYRTEC) 10 MG tablet Take 10 mg by mouth daily.     Yes Historical Provider, MD  Cholecalciferol (VITAMIN D) 2000 UNITS CAPS Take by mouth.     Yes Historical Provider, MD  citalopram (CELEXA) 20 MG tablet TAKE 1 BY MOUTH DAILY   Yes Eleanore E Egan, PA-C  diclofenac (VOLTAREN) 75 MG EC tablet TAKE 1 BY MOUTH TWICE DAILY   Yes Barton Fanny, MD  fish oil-omega-3 fatty acids 1000 MG capsule Take 2 g by mouth daily. PER PATIENT TAKE 8000 MG DAILY   Yes Historical Provider, MD  fluticasone (FLONASE) 50 MCG/ACT nasal spray Place 2 sprays into both nostrils daily. 10/18/13 10/18/14 Yes Barton Fanny, MD  gabapentin (NEURONTIN) 300 MG capsule TAKE 1 BY MOUTH IN THE MORNING AND 2 AT BEDTIME   Yes Eleanore E Egan, PA-C  glimepiride (AMARYL) 4 MG tablet Take 1/2 tablet twice a day before main meals. 05/01/14  Yes Barton Fanny, MD  hydrOXYzine (ATARAX/VISTARIL) 25 MG tablet as needed. 08/24/13  Yes Historical Provider, MD  losartan (COZAAR) 100 MG tablet Take 1 tablet (100 mg total) by mouth daily. 08/22/13  Yes Barton Fanny, MD  metoprolol succinate (  TOPROL-XL) 50 MG 24 hr tablet Take 2 tablets (100 mg total) by mouth daily. Take with or immediately following a meal. 09/27/13  Yes Barton Fanny, MD  Multiple Vitamin (MULTIVITAMIN) tablet Take 1 tablet by mouth daily.     Yes Historical Provider, MD  omeprazole (PRILOSEC) 20 MG capsule TAKE 1 BY MOUTH DAILY   Yes Eleanore E Egan, PA-C  ondansetron (ZOFRAN-ODT) 8 MG disintegrating tablet DISSOLVE 1 ON TONGUE EVERY 12 HOURS AS NEEDED FOR  NAUSEA   Yes Barton Fanny, MD  triamcinolone (KENALOG) 0.1 % cream Apply 1 application topically as needed.     Yes Historical Provider, MD  zoster vaccine live, PF, (ZOSTAVAX) 40981 UNT/0.65ML injection Inject 19,400 Units into the skin once. 05/01/14   Barton Fanny, MD    History   Social History  . Marital Status: Single    Spouse Name: N/A    Number of Children: 0  . Years of Education: N/A   Occupational History  . chef    Social History Main Topics  . Smoking status: Former Smoker    Types: Cigarettes    Quit date: 10/12/1991  . Smokeless tobacco: Never Used     Comment: Quit 20 yrs ago  . Alcohol Use: No  . Drug Use: No  . Sexual Activity: Yes    Birth Control/ Protection: Post-menopausal, Other-see comments     Comment: Monogamous    Other Topics Concern  . Not on file   Social History Narrative   Single. Exercise: No. Education: College.     Review of Systems  Constitutional: Negative for diaphoresis, appetite change and unexpected weight change.  Eyes: Negative.   Respiratory: Negative for cough, chest tightness, shortness of breath and wheezing.   Cardiovascular: Negative.   Musculoskeletal: Positive for myalgias, arthralgias, neck pain and neck stiffness.  Neurological: Positive for numbness. Negative for dizziness, syncope, weakness, light-headedness and headaches.  Psychiatric/Behavioral: Negative.        Objective:   Physical Exam  Constitutional: She is oriented to person, place, and time. She appears well-developed and well-nourished. No distress.  HENT:  Head: Normocephalic and atraumatic.  Right Ear: External ear normal.  Left Ear: External ear normal.  Nose: Nose normal.  Mouth/Throat: Oropharynx is clear and moist.  Eyes: Conjunctivae and EOM are normal. Pupils are equal, round, and reactive to light. No scleral icterus.  Neck: Normal range of motion and full passive range of motion without pain. Neck supple. No spinous  process tenderness and no muscular tenderness present. No thyromegaly present.  Cardiovascular: Normal rate, regular rhythm, S1 normal, S2 normal and normal heart sounds.   No extrasystoles are present. Exam reveals no gallop.   No murmur heard. Pulmonary/Chest: Effort normal and breath sounds normal. No respiratory distress. She has no wheezes.  Musculoskeletal: Normal range of motion. She exhibits no edema.  Neurological: She is alert and oriented to person, place, and time. No cranial nerve deficit. Coordination normal.  Skin: Skin is warm and dry. She is not diaphoretic. No erythema.  Psychiatric: She has a normal mood and affect. Her behavior is normal. Judgment and thought content normal.  Nursing note and vitals reviewed.   [facility] reading (PRIMARY) by  Dr. Leward Quan: Cervical spine- Good alignment with mild degenerative changes at mid and lower levels. No fracture or subluxation.     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 Monitor symptoms and return if worse or decreased  function develops. Plan: DG Cervical Spine Complete  Severe essential hypertension- Continue current medications; follow-up w/ Dr. Caryl Comes as scheduled. Continue walking program with weight loss reduction.  Severe obesity (BMI >= 40)  Need for prophylactic vaccination and inoculation against influenza - Plan: Flu Vaccine QUAD 36+ mos IM  Medication refill  Meds ordered this encounter  Medications  . diclofenac (VOLTAREN) 75 MG EC tablet    Sig: TAKE 1 BY MOUTH TWICE DAILY    Dispense:  180 tablet    Refill:  1  . ondansetron (ZOFRAN-ODT) 8 MG disintegrating tablet    Sig: DISSOLVE 1 ON TONGUE EVERY 12 HOURS AS NEEDED FOR NAUSEA    Dispense:  90 tablet    Refill:  3  . citalopram (CELEXA) 20 MG tablet    Sig: TAKE 1 BY MOUTH DAILY    Dispense:  90 tablet    Refill:  3  . omeprazole (PRILOSEC) 20 MG capsule    Sig: TAKE 1 BY MOUTH DAILY    Dispense:  90 capsule     Refill:  3  . gabapentin (NEURONTIN) 300 MG capsule    Sig: TAKE 1 BY MOUTH IN THE MORNING AND 2 AT BEDTIME    Dispense:  270 capsule    Refill:  3  . benazepril-hydrochlorthiazide (LOTENSIN HCT) 20-12.5 MG per tablet    Sig: Take 1 tablet by mouth 2 (two) times daily.    Dispense:  180 tablet    Refill:  3  . metoprolol succinate (TOPROL-XL) 50 MG 24 hr tablet    Sig: Take 2 tablets (100 mg total) by mouth daily. Take with or immediately following a meal.    Dispense:  180 tablet    Refill:  3  . losartan (COZAAR) 100 MG tablet    Sig: Take 1 tablet (100 mg total) by mouth daily.    Dispense:  90 tablet    Refill:  3

## 2014-09-10 NOTE — Patient Instructions (Signed)

## 2014-09-18 ENCOUNTER — Ambulatory Visit (INDEPENDENT_AMBULATORY_CARE_PROVIDER_SITE_OTHER): Payer: BC Managed Care – PPO | Admitting: Internal Medicine

## 2014-09-18 ENCOUNTER — Encounter: Payer: Self-pay | Admitting: Internal Medicine

## 2014-09-18 ENCOUNTER — Telehealth: Payer: Self-pay | Admitting: Family Medicine

## 2014-09-18 VITALS — BP 140/92 | HR 74 | Ht 62.0 in | Wt 234.0 lb

## 2014-09-18 DIAGNOSIS — Z45018 Encounter for adjustment and management of other part of cardiac pacemaker: Secondary | ICD-10-CM

## 2014-09-18 DIAGNOSIS — R55 Syncope and collapse: Secondary | ICD-10-CM | POA: Diagnosis not present

## 2014-09-18 DIAGNOSIS — I495 Sick sinus syndrome: Secondary | ICD-10-CM | POA: Diagnosis not present

## 2014-09-18 DIAGNOSIS — I10 Essential (primary) hypertension: Secondary | ICD-10-CM

## 2014-09-18 DIAGNOSIS — K760 Fatty (change of) liver, not elsewhere classified: Secondary | ICD-10-CM

## 2014-09-18 DIAGNOSIS — I5032 Chronic diastolic (congestive) heart failure: Secondary | ICD-10-CM

## 2014-09-18 LAB — MDC_IDC_ENUM_SESS_TYPE_INCLINIC
Battery Impedance: 251 Ohm
Battery Remaining Longevity: 131 mo
Battery Voltage: 2.79 V
Brady Statistic AP VP Percent: 0 %
Brady Statistic AP VS Percent: 0 %
Brady Statistic AS VP Percent: 0 %
Lead Channel Impedance Value: 435 Ohm
Lead Channel Pacing Threshold Amplitude: 0.5 V
Lead Channel Sensing Intrinsic Amplitude: 2 mV
Lead Channel Setting Pacing Amplitude: 2.5 V
Lead Channel Setting Pacing Pulse Width: 0.4 ms
MDC IDC MSMT LEADCHNL RA PACING THRESHOLD PULSEWIDTH: 0.4 ms
MDC IDC MSMT LEADCHNL RV IMPEDANCE VALUE: 509 Ohm
MDC IDC MSMT LEADCHNL RV PACING THRESHOLD AMPLITUDE: 0.5 V
MDC IDC MSMT LEADCHNL RV PACING THRESHOLD PULSEWIDTH: 0.4 ms
MDC IDC MSMT LEADCHNL RV SENSING INTR AMPL: 11.2 mV
MDC IDC SESS DTM: 20151209200406
MDC IDC SET LEADCHNL RA PACING AMPLITUDE: 2 V
MDC IDC SET LEADCHNL RV SENSING SENSITIVITY: 4 mV
MDC IDC STAT BRADY AS VS PERCENT: 100 %

## 2014-09-18 NOTE — Patient Instructions (Addendum)
Your physician recommends that you continue on your current medications as directed. Please refer to the Current Medication list given to you today.  Remote monitoring is used to monitor your pacemaker from home. This monitoring reduces the number of office visits required to check your device to one time per year. It allows [place] to keep an eye on the functioning of your device to ensure it is working properly. You are scheduled for a device check from home on 12-18-2014. You may send your transmission at any time that day. If you have a wireless device, the transmission will be sent automatically. After your physician reviews your transmission, you will receive a postcard with your next transmission date.  Your physician recommends that you schedule a follow-up appointment in: 12 months with Dr.Klein

## 2014-09-18 NOTE — Telephone Encounter (Signed)
I phoned pt and left her a message indicating that she needs CMET drawn within next 2 weeks (not good idea to wait until March OV with me). Please call her and have her come in for LAB ONLY visit. I have ordered the CMET already.  Thank you.

## 2014-09-18 NOTE — Progress Notes (Signed)
Patient Care Team: Barton Fanny, MD as PCP - General (Family Medicine) Bethann Goo Himmelrich, RD as Dietitian Deboraha Sprang, MD as Attending Physician (Cardiology) Griffith Citron, MD as Consulting Physician (Ophthalmology)   HPI  [name] [name] is a 58 y.o. female With a history of Pacing for sinus node dysfunction. She paces almost never. She's had problems with dyspnea on exertion. This has been unaccompanied by edema. She underwent Myoview scanning which was negative but was notable for significant nonsustained ventricular tachycardia. Left ventricular function was normal.  Her DOE tracks closely with the BP  She has been exercising and is now able to climb 3 flts of stairs    Past Medical History  Diagnosis Date  . Fatty liver 04/2011  . Hypertension severe   . OSA (obstructive sleep apnea)     C-Pap  . Obesity   . Asthma   . Migraine   . Anal fissure   . Colon polyps     diverticulosis  03-2011/2005  . Diverticulosis   . GERD (gastroesophageal reflux disease)   . Hyperlipidemia   . Diabetes mellitus 2012    T2DM  . Sinoatrial node dysfunction     syncope  . Allergy   . Arthritis   . Depression   . Anxiety   . Pacemaker Medtronic   . Cataract 2010     left    Past Surgical History  Procedure Laterality Date  . Pacemaker placement    . Oophorectomy    . Tonsillectomy    . Eye surgery Right 2010  . Abdominal hysterectomy  2000    total  . T/a right knee      Current Outpatient Prescriptions  Medication Sig Dispense Refill  . albuterol (PROAIR HFA) 108 (90 BASE) MCG/ACT inhaler Inhale 2 puffs into the lungs as needed. 2 Inhaler 3  . ALPRAZolam (XANAX) 0.25 MG tablet Take 1 tablet once a day prn anxiety. 30 tablet 1  . beclomethasone (QVAR) 80 MCG/ACT inhaler Inhale 2 puffs twice a day. Rinse after use. 3 Inhaler 3  . benazepril-hydrochlorthiazide (LOTENSIN HCT) 20-12.5 MG per tablet Take 1 tablet by mouth 2 (two) times daily. 180 tablet  3  . butalbital-acetaminophen-caffeine (FIORICET, ESGIC) 50-325-40 MG per tablet Take 1 tablet by mouth 2 (two) times daily as needed for headache. 30 tablet 1  . cetirizine (ZYRTEC) 10 MG tablet Take 10 mg by mouth daily.      . Cholecalciferol (VITAMIN D) 2000 UNITS CAPS Take by mouth.      . citalopram (CELEXA) 20 MG tablet TAKE 1 BY MOUTH DAILY 90 tablet 3  . diclofenac (VOLTAREN) 75 MG EC tablet TAKE 1 BY MOUTH TWICE DAILY 180 tablet 1  . fish oil-omega-3 fatty acids 1000 MG capsule Take 2 g by mouth daily. PER PATIENT TAKE 8000 MG DAILY    . fluticasone (FLONASE) 50 MCG/ACT nasal spray Place 2 sprays into both nostrils daily. 48 g 3  . gabapentin (NEURONTIN) 300 MG capsule TAKE 1 BY MOUTH IN THE MORNING AND 2 AT BEDTIME 270 capsule 3  . glimepiride (AMARYL) 4 MG tablet Take 1/2 tablet twice a day before main meals. 90 tablet 3  . hydrOXYzine (ATARAX/VISTARIL) 25 MG tablet as needed.    Marland Kitchen losartan (COZAAR) 100 MG tablet Take 1 tablet (100 mg total) by mouth daily. 90 tablet 3  . metoprolol succinate (TOPROL-XL) 50 MG 24 hr tablet Take 2 tablets (100 mg total) by mouth daily.  Take with or immediately following a meal. 180 tablet 3  . Multiple Vitamin (MULTIVITAMIN) tablet Take 1 tablet by mouth daily.      Marland Kitchen omeprazole (PRILOSEC) 20 MG capsule TAKE 1 BY MOUTH DAILY 90 capsule 3  . ondansetron (ZOFRAN-ODT) 8 MG disintegrating tablet DISSOLVE 1 ON TONGUE EVERY 12 HOURS AS NEEDED FOR NAUSEA 90 tablet 3  . triamcinolone (KENALOG) 0.1 % cream Apply 1 application topically as needed.      . zoster vaccine live, PF, (ZOSTAVAX) 03559 UNT/0.65ML injection Inject 19,400 Units into the skin once. 1 each 0   No current facility-administered medications for this visit.    Allergies  Allergen Reactions  . Adhesive [Tape] Other (See Comments)    SKIN BLISTERS  . Penicillins   . Red Yeast Rice Hives  . Sulfa Drugs Cross Reactors Hives    Review of Systems negative except from HPI and  PMH  Physical Exam BP 140/92 mmHg  Pulse 74  Ht 5\' 2"  (1.575 m)  Wt 234 lb (106.142 kg)  BMI 42.79 kg/m2 Well developed and well nourished in no acute distress HENT normal E scleral and icterus clear Neck Supple JVP flat; carotids brisk and full Clear to ausculat Regular rate and rhythm, no murmurs gallops or rub Soft with active bowel sounds No clubbing cyanosis none Edema Alert and oriented, grossly normal motor and sensory function Skin Warm and Dry  ECG  71 19/11/44  Assessment and  Plan  Hypertension  HFpEF  Syncope  Pacemaker-Medtronic  Sinus node dysfunction  Overall doing wwell euvolemic  BP under better control no recurrent syncope

## 2014-09-18 NOTE — Telephone Encounter (Signed)
LM with information to have pt rtc for labs only within the next 2 weeks.

## 2014-09-19 ENCOUNTER — Telehealth: Payer: Self-pay

## 2014-09-19 ENCOUNTER — Encounter: Payer: BC Managed Care – PPO | Admitting: Internal Medicine

## 2014-09-19 NOTE — Telephone Encounter (Signed)
LMVM reminding patient to get her flu shot.

## 2014-09-23 ENCOUNTER — Encounter: Payer: Self-pay | Admitting: Internal Medicine

## 2014-09-24 ENCOUNTER — Encounter: Payer: Self-pay | Admitting: Family Medicine

## 2014-09-24 ENCOUNTER — Ambulatory Visit (INDEPENDENT_AMBULATORY_CARE_PROVIDER_SITE_OTHER): Payer: BC Managed Care – PPO | Admitting: Family Medicine

## 2014-09-24 VITALS — BP 150/104 | HR 64 | Temp 98.6°F | Resp 16 | Ht 63.0 in | Wt 232.8 lb

## 2014-09-24 DIAGNOSIS — E119 Type 2 diabetes mellitus without complications: Secondary | ICD-10-CM

## 2014-09-24 DIAGNOSIS — I10 Essential (primary) hypertension: Secondary | ICD-10-CM

## 2014-09-24 DIAGNOSIS — E1121 Type 2 diabetes mellitus with diabetic nephropathy: Secondary | ICD-10-CM

## 2014-09-24 LAB — COMPREHENSIVE METABOLIC PANEL
ALBUMIN: 4.5 g/dL (ref 3.5–5.2)
ALT: 23 U/L (ref 0–35)
AST: 21 U/L (ref 0–37)
Alkaline Phosphatase: 72 U/L (ref 39–117)
BUN: 19 mg/dL (ref 6–23)
CO2: 28 mEq/L (ref 19–32)
Calcium: 9.6 mg/dL (ref 8.4–10.5)
Chloride: 106 mEq/L (ref 96–112)
Creat: 0.86 mg/dL (ref 0.50–1.10)
Glucose, Bld: 111 mg/dL — ABNORMAL HIGH (ref 70–99)
POTASSIUM: 4.7 meq/L (ref 3.5–5.3)
Sodium: 142 mEq/L (ref 135–145)
Total Bilirubin: 0.3 mg/dL (ref 0.2–1.2)
Total Protein: 7 g/dL (ref 6.0–8.3)

## 2014-09-24 LAB — POCT GLYCOSYLATED HEMOGLOBIN (HGB A1C): Hemoglobin A1C: 6.8

## 2014-09-24 NOTE — Patient Instructions (Signed)
Substitute Tylenol 1000 mg every 8 hours for pain, and stop take diclofenac, as this may exacerbate your hypertension.

## 2014-09-24 NOTE — Progress Notes (Signed)
IDENTIFYING INFORMATION  [name] [name] / DOB: 09-03-1956 / MRN: [id number]  The patient has Severe essential hypertension; DIASTOLIC HEART FAILURE, CHRONIC; DYSPNEA; OSA (obstructive sleep apnea); Hx of adenomatous colonic polyps; Migraine; DM2 (diabetes mellitus, type 2); GERD (gastroesophageal reflux disease); NAFLD (nonalcoholic fatty liver disease); Hyperlipidemia; Sinoatrial node dysfunction; Syncope and collapse; Ventricular tachycardia, nonsustained-during exercise testing; and Pacemaker Medtronic on her problem list.  SUBJECTIVE  CC: labs and check kidney functions   HPI: [name] [name] is a 59 y.o. y.o. female presenting for a diabetes recheck. She checks her sugar "not enough."  Her last check was 5 days ago and it was 120 ish.  She denies sock and glove paraesthesia, changes in urination, eye pain and acute changes in vision.  She denies any new DOE and SOB.  She receives eye care annually.    She continues to complain of shortness of breath when her BP increases.  She can not develop a temporal association for this phenomenon. She had a CT scan of her abdomen in 2004 at [facility] and reports her BP became more difficult to control about two years ago.      She  has a past medical history of Fatty liver (04/2011); Hypertension severe; OSA (obstructive sleep apnea); Obesity; Asthma; Migraine; Anal fissure; Colon polyps; Diverticulosis; GERD (gastroesophageal reflux disease); Hyperlipidemia; Diabetes mellitus (2012); Sinoatrial node dysfunction; Allergy; Arthritis; Depression; Anxiety; Pacemaker Medtronic; and Cataract (2010 ).    She has a current medication list which includes the following prescription(s): albuterol, alprazolam, beclomethasone, benazepril-hydrochlorthiazide, butalbital-acetaminophen-caffeine, cetirizine, vitamin d, citalopram, diclofenac, fish oil-omega-3 fatty acids, fluticasone, gabapentin, glimepiride, hydroxyzine, losartan, metoprolol succinate, multivitamin,  omeprazole, ondansetron, triamcinolone cream, and zoster vaccine live (pf).  [name] [name] is allergic to adhesive; penicillins; red yeast rice; and sulfa drugs cross reactors. She  reports that she quit smoking about 22 years ago. Her smoking use included Cigarettes. She smoked 0.00 packs per day. She has never used smokeless tobacco. She reports that she does not drink alcohol or use illicit drugs. She  reports that she currently engages in sexual activity. She reports using the following methods of birth control/protection: Post-menopausal and Other-see comments.  The patient  has past surgical history that includes pacemaker placement; Oophorectomy; Tonsillectomy; Eye surgery (Right, 2010); Abdominal hysterectomy (2000); and T/A right knee.  Her family history includes Aneurysm in her paternal grandfather; Breast cancer in her brother and mother; Cancer in her maternal grandmother; Cancer (age of onset: 1) in her mother; Diabetes in her sister; Heart disease in her father and mother; Leukemia in her paternal grandmother. There is no history of Colon cancer.  Review of Systems  Constitutional: Negative for fever and chills.  Respiratory: Negative for cough and wheezing.   Cardiovascular: Negative for chest pain, leg swelling and PND.  Gastrointestinal: Negative for heartburn, nausea and vomiting.  Genitourinary: Negative.   Musculoskeletal: Negative for neck pain.  Skin: Negative for itching and rash.  Neurological: Negative for dizziness and headaches.  Psychiatric/Behavioral: Negative for depression.    OBJECTIVE  Blood pressure 150/104, pulse 64, temperature 98.6 F (37 C), temperature source Oral, resp. rate 16, height 5\' 3"  (1.6 m), weight 232 lb 12.8 oz (105.597 kg), SpO2 94 %. The patient's body mass index is 41.25 kg/(m^2).  Physical Exam  Vitals reviewed. Constitutional: She is oriented to person, place, and time. No distress.  Neck: Normal range of motion.  Cardiovascular: Normal  rate, regular rhythm, normal heart sounds and intact distal pulses.   Respiratory: Effort  normal and breath sounds normal.  GI: Soft. Bowel sounds are normal.  Musculoskeletal: Normal range of motion.  Neurological: She is alert and oriented to person, place, and time.  Skin: Skin is warm and dry. She is not diaphoretic.  Psychiatric: She has a normal mood and affect.    Results for orders placed or performed in visit on 09/24/14 (from the past 24 hour(s))  POCT glycosylated hemoglobin (Hb A1C)     Status: None   Collection Time: 09/24/14  4:45 PM  Result Value Ref Range   Hemoglobin A1C 6.8     ASSESSMENT & PLAN  [name] [name] was seen today for labs and check kidney functions.  Diagnoses and associated orders for this visit:  Type 2 diabetes mellitus without complication - Hemoglobin A1c - Comprehensive metabolic panel - [facility] Diabetes Foot Exam - Microalbumin, urine -     A1C below 7 today.  Consider metformin for future control should her A1C warrant.    Severe essential hypertension: Can not rule out renal artery stenosis at this time. Consider  -  Stopping all NSAIDs today, will substitute with tylenol -  [place] to screen of renal artery stenosis. -  Other future considerations include Cushing syndrome and pheochromocytoma.   The patient was instructed to to call or comeback to clinic as needed, or should symptoms warrant.  Philis Fendt, MHS, PA-C [facility] 09/24/2014 4:46 PM

## 2014-09-25 ENCOUNTER — Encounter: Payer: Self-pay | Admitting: Family Medicine

## 2014-09-25 LAB — MICROALBUMIN, URINE: Microalb, Ur: 2.9 mg/dL — ABNORMAL HIGH (ref <2.0)

## 2014-09-25 NOTE — Progress Notes (Signed)
I have reviewed the PA-C's documentation and discussed assessment and plan. A1c indicates very good glycemic control on current medication regimen. Encouraged TLCs/ nutrition changes and increase exercise as tolerated. I agree with documentation and have nothing to add.  Barton Fanny, MD [facility]

## 2014-09-30 ENCOUNTER — Encounter: Payer: Self-pay | Admitting: Family Medicine

## 2014-10-01 ENCOUNTER — Other Ambulatory Visit: Payer: Self-pay | Admitting: Family Medicine

## 2014-10-01 DIAGNOSIS — I495 Sick sinus syndrome: Secondary | ICD-10-CM

## 2014-10-01 MED ORDER — LOSARTAN POTASSIUM 100 MG PO TABS: 100.0000 mg | ORAL_TABLET | Freq: Every day | ORAL | Status: DC

## 2014-10-01 MED ORDER — METOPROLOL SUCCINATE ER 50 MG PO TB24: 100.0000 mg | ORAL_TABLET | Freq: Every day | ORAL | Status: DC

## 2014-10-01 MED ORDER — OMEPRAZOLE 20 MG PO CPDR: DELAYED_RELEASE_CAPSULE | ORAL | Status: DC

## 2014-10-21 ENCOUNTER — Telehealth: Payer: Self-pay | Admitting: *Deleted

## 2014-10-21 NOTE — Telephone Encounter (Signed)
Documented in epic pt received flu shot 09/10/14.

## 2014-12-02 ENCOUNTER — Other Ambulatory Visit: Payer: Self-pay | Admitting: Family Medicine

## 2014-12-04 ENCOUNTER — Encounter: Payer: Self-pay | Admitting: Family Medicine

## 2014-12-11 ENCOUNTER — Ambulatory Visit (INDEPENDENT_AMBULATORY_CARE_PROVIDER_SITE_OTHER): Payer: Self-pay | Admitting: Family Medicine

## 2014-12-11 ENCOUNTER — Encounter: Payer: Self-pay | Admitting: Family Medicine

## 2014-12-11 ENCOUNTER — Ambulatory Visit (INDEPENDENT_AMBULATORY_CARE_PROVIDER_SITE_OTHER): Payer: Self-pay

## 2014-12-11 VITALS — BP 113/72 | HR 69 | Temp 97.5°F | Resp 16 | Ht 63.0 in | Wt 229.0 lb

## 2014-12-11 DIAGNOSIS — M25562 Pain in left knee: Secondary | ICD-10-CM

## 2014-12-11 DIAGNOSIS — E1121 Type 2 diabetes mellitus with diabetic nephropathy: Secondary | ICD-10-CM

## 2014-12-11 LAB — POCT GLYCOSYLATED HEMOGLOBIN (HGB A1C): Hemoglobin A1C: 7.4

## 2014-12-11 MED ORDER — TRAMADOL HCL 50 MG PO TABS: ORAL_TABLET | ORAL | Status: DC

## 2014-12-11 NOTE — Patient Instructions (Signed)
Immunization recommendation-- PREVNAR13 within the next 6- 12 months followed by repeat PNEUMONVAX  At a date to be determined.      Mediterranean Diet  Why follow it? Research shows. . Those who follow the Mediterranean diet have a reduced risk of heart disease  . The diet is associated with a reduced incidence of Parkinson's and Alzheimer's diseases . People following the diet may have longer life expectancies and lower rates of chronic diseases  . The Dietary Guidelines for Americans recommends the Mediterranean diet as an eating plan to promote health and prevent disease  What Is the Mediterranean Diet?  . Healthy eating plan based on typical foods and recipes of Mediterranean-style cooking . The diet is primarily a plant based diet; these foods should make up a majority of meals   Starches - Plant based foods should make up a majority of meals - They are an important sources of vitamins, minerals, energy, antioxidants, and fiber - Choose whole grains, foods high in fiber and minimally processed items  - Typical grain sources include wheat, oats, barley, corn, brown rice, bulgar, farro, millet, polenta, couscous  - Various types of beans include chickpeas, lentils, fava beans, black beans, white beans   Fruits  Veggies - Large quantities of antioxidant rich fruits & veggies; 6 or more servings  - Vegetables can be eaten raw or lightly drizzled with oil and cooked  - Vegetables common to the traditional Mediterranean Diet include: artichokes, arugula, beets, broccoli, brussel sprouts, cabbage, carrots, celery, collard greens, cucumbers, eggplant, kale, leeks, lemons, lettuce, mushrooms, okra, onions, peas, peppers, potatoes, pumpkin, radishes, rutabaga, shallots, spinach, sweet potatoes, turnips, zucchini - Fruits common to the Mediterranean Diet include: apples, apricots, avocados, cherries, clementines, dates, figs, grapefruits, grapes, melons, nectarines, oranges, peaches, pears,  pomegranates, strawberries, tangerines  Fats - Replace butter and margarine with healthy oils, such as olive oil, canola oil, and tahini  - Limit nuts to no more than a handful a day  - Nuts include walnuts, almonds, pecans, pistachios, pine nuts  - Limit or avoid candied, honey roasted or heavily salted nuts - Olives are central to the [employer] - can be eaten whole or used in a variety of dishes   Meats Protein - Limiting red meat: no more than a few times a month - When eating red meat: choose lean cuts and keep the portion to the size of deck of cards - Eggs: approx. 0 to 4 times a week  - Fish and lean poultry: at least 2 a week  - Healthy protein sources include, chicken, [place], lean beef, lamb - Increase intake of seafood such as tuna, salmon, trout, mackerel, shrimp, scallops - Avoid or limit high fat processed meats such as sausage and bacon  Dairy - Include moderate amounts of low fat dairy products  - Focus on healthy dairy such as fat free yogurt, skim milk, low or reduced fat cheese - Limit dairy products higher in fat such as whole or 2% milk, cheese, ice cream  Alcohol - Moderate amounts of red wine is ok  - No more than 5 oz daily for women (all ages) and men older than age 14  - No more than 10 oz of wine daily for men younger than 13  Other - Limit sweets and other desserts  - Use herbs and spices instead of salt to flavor foods  - Herbs and spices common to the traditional Mediterranean Diet include: basil, bay leaves, chives, cloves, cumin, fennel, garlic,  lavender, marjoram, mint, oregano, parsley, pepper, rosemary, sage, savory, sumac, tarragon, thyme   It's not just a diet, it's a lifestyle:  . The Mediterranean diet includes lifestyle factors typical of those in the region  . Foods, drinks and meals are best eaten with others and savored . Daily physical activity is important for overall good health . This could be strenuous exercise like running and  aerobics . This could also be more leisurely activities such as walking, housework, yard-work, or taking the stairs . Moderation is the key; a balanced and healthy diet accommodates most foods and drinks . Consider portion sizes and frequency of consumption of certain foods   Meal Ideas & Options:  . Breakfast:  o Whole wheat toast or whole wheat English muffins with peanut butter & hard boiled egg o Steel cut oats topped with apples & cinnamon and skim milk  o Fresh fruit: banana, strawberries, melon, berries, peaches  o Smoothies: strawberries, bananas, greek yogurt, peanut butter o Low fat greek yogurt with blueberries and granola  o Egg white omelet with spinach and mushrooms o Breakfast couscous: whole wheat couscous, apricots, skim milk, cranberries  . Sandwiches:  o Hummus and grilled vegetables (peppers, zucchini, squash) on whole wheat bread   o Grilled chicken on whole wheat pita with lettuce, tomatoes, cucumbers or tzatziki  o Tuna salad on whole wheat bread: tuna salad made with greek yogurt, olives, red peppers, capers, green onions o Garlic rosemary lamb pita: lamb sauted with garlic, rosemary, salt & pepper; add lettuce, cucumber, greek yogurt to pita - flavor with lemon juice and black pepper  . Seafood:  o Mediterranean grilled salmon, seasoned with garlic, basil, parsley, lemon juice and black pepper o Shrimp, lemon, and spinach whole-grain pasta salad made with low fat greek yogurt  o Seared scallops with lemon orzo  o Seared tuna steaks seasoned salt, pepper, coriander topped with tomato mixture of olives, tomatoes, olive oil, minced garlic, parsley, green onions and cappers  . Meats:  o Herbed greek chicken salad with kalamata olives, cucumber, feta  o Red bell peppers stuffed with spinach, bulgur, lean ground beef (or lentils) & topped with feta   o Kebabs: skewers of chicken, tomatoes, onions, zucchini, squash  o [place] burgers: made with red onions, mint, dill,  lemon juice, feta cheese topped with roasted red peppers . Vegetarian o Cucumber salad: cucumbers, artichoke hearts, celery, red onion, feta cheese, tossed in olive oil & lemon juice  o Hummus and whole grain pita points with a greek salad (lettuce, tomato, feta, olives, cucumbers, red onion) o Lentil soup with celery, carrots made with vegetable broth, garlic, salt and pepper  o Tabouli salad: parsley, bulgur, mint, scallions, cucumbers, tomato, radishes, lemon juice, olive oil, salt and pepper.   I have ordered a referral to Dr. Vickey Huger for evaluation of your knee pain. He will determine the next best step for imaging and treatment.

## 2014-12-11 NOTE — Progress Notes (Signed)
S:  This 59 y.o. Female is here for DM follow-up; she admits not taking Glimepiride during the last month due to noncompliance on her part. She has a mail order drug plan and did not realize that she had not re-ordered the medication. She has maintained healthier nutrition habits and managed to lose a few pounds.   Pt has generalized musculoskeletal /arthritic pain, worse since diclofenac was discontinued. OTC topical analgesics are minimally effective. Today, pt c/o posterior L knee pan x 1-2 weeks. She denies trauma. Pain increases at night and pt cannot get comfortable. She has a hx of R meniscal tear requiring open surgery back in 1974-75. This L knee pain feels like a similar problem.  Patient Active Problem List   Diagnosis Date Noted  . Pacemaker Medtronic   . Ventricular tachycardia, nonsustained-during exercise testing 03/07/2013  . Syncope and collapse 10/19/2011  . Sinoatrial node dysfunction   . OSA (obstructive sleep apnea) 05/25/2011  . Hx of adenomatous colonic polyps 05/25/2011  . Migraine 05/25/2011  . DM2 (diabetes mellitus, type 2) 05/25/2011  . GERD (gastroesophageal reflux disease) 05/25/2011  . NAFLD (nonalcoholic fatty liver disease) 05/25/2011  . Hyperlipidemia 05/25/2011  . Severe essential hypertension 09/16/2009  . DIASTOLIC HEART FAILURE, CHRONIC 09/16/2009  . DYSPNEA 09/16/2009    Prior to Admission medications   Medication Sig Start Date End Date Taking? Authorizing Provider  albuterol (PROAIR HFA) 108 (90 BASE) MCG/ACT inhaler Inhale 2 puffs into the lungs as needed. 05/01/14  Yes Barton Fanny, MD  ALPRAZolam Duanne Moron) 0.25 MG tablet Take 1 tablet once a day prn anxiety. 05/01/14  Yes Barton Fanny, MD  beclomethasone (QVAR) 80 MCG/ACT inhaler Inhale 2 puffs twice a day. Rinse after use. 05/01/14  Yes Barton Fanny, MD  benazepril-hydrochlorthiazide (LOTENSIN HCT) 20-12.5 MG per tablet Take 1 tablet by mouth 2 (two) times daily. 09/10/14  Yes  Barton Fanny, MD  butalbital-acetaminophen-caffeine (FIORICET, ESGIC) [id number] MG per tablet Take 1 tablet by mouth 2 (two) times daily as needed for headache. 11/22/13  Yes Barton Fanny, MD  cetirizine (ZYRTEC) 10 MG tablet Take 10 mg by mouth daily.     Yes Historical Provider, MD  Cholecalciferol (VITAMIN D) 2000 UNITS CAPS Take by mouth.     Yes Historical Provider, MD  citalopram (CELEXA) 20 MG tablet TAKE 1 BY MOUTH DAILY 09/10/14  Yes Barton Fanny, MD  fish oil-omega-3 fatty acids 1000 MG capsule Take 2 g by mouth daily. PER PATIENT TAKE 8000 MG DAILY   Yes Historical Provider, MD  fluticasone (FLONASE) 50 MCG/ACT nasal spray USE 2 SPRAYS IN EACH NOSTRIL DAILY 12/04/14  Yes Barton Fanny, MD  gabapentin (NEURONTIN) 300 MG capsule TAKE 1 BY MOUTH IN THE MORNING AND 2 AT BEDTIME 09/10/14  Yes Barton Fanny, MD  glimepiride (AMARYL) 4 MG tablet Take 1/2 tablet twice a day before main meals. 05/01/14  No Barton Fanny, MD  hydrOXYzine (ATARAX/VISTARIL) 25 MG tablet as needed. 08/24/13  Yes Historical Provider, MD  losartan (COZAAR) 100 MG tablet Take 1 tablet (100 mg total) by mouth daily. 10/01/14  Yes Barton Fanny, MD  metoprolol succinate (TOPROL-XL) 50 MG 24 hr tablet Take 2 tablets (100 mg total) by mouth daily. Take with or immediately following a meal. 10/01/14  Yes Barton Fanny, MD  Multiple Vitamin (MULTIVITAMIN) tablet Take 1 tablet by mouth daily.     Yes Historical Provider, MD  omeprazole (PRILOSEC) 20 MG capsule TAKE  1 BY MOUTH DAILY 10/01/14  Yes Barton Fanny, MD  ondansetron (ZOFRAN-ODT) 8 MG disintegrating tablet DISSOLVE 1 ON TONGUE EVERY 12 HOURS AS NEEDED FOR NAUSEA 09/10/14  Yes Barton Fanny, MD  triamcinolone (KENALOG) 0.1 % cream Apply 1 application topically as needed.     Yes Historical Provider, MD  zoster vaccine live, PF, (ZOSTAVAX) 06269 UNT/0.65ML injection Inject 19,400 Units into the skin once. 05/01/14   Yes Barton Fanny, MD    Past Surgical History  Procedure Laterality Date  . Pacemaker placement    . Oophorectomy    . Tonsillectomy    . Eye surgery Right 2010  . Abdominal hysterectomy  2000    total  . T/a right knee      SOC and FAM HX reviewed.   ROS: As per HPI. Negative for fever/chills, fatigue, abnormal weight change, CP or tightness, palpitations, SOB or DOE, edema, HA, dizziness, joint swelling or effusion/ redness/ deformity, numbness or weakness.  O: Filed Vitals:   12/11/14 0958  BP: 113/72  Pulse: 69  Temp: 97.5 F (36.4 C)  Resp: 16    GEN: In NAD; WN,WD. Obese w/ 3-4 lb weight loss. COR: RRR. LUNGS: Unlabored resp. MS: L knee- Normal appearance w/o effusion. Tender popliteal space along medial aspect. Tibial joint line NT. Normal ROM.  NEURO: A&O x 3; CNs intact. Nonfocal.  Results for orders placed or performed in visit on 12/11/14  POCT glycosylated hemoglobin (Hb A1C)  Result Value Ref Range   Hemoglobin A1C 7.4     [facility] reading (PRIMARY) by  Dr. Leward Quan: L knee- mild degenerative changes; no fracture of dislocation. No abnormal bony lesions.   A/P: Type 2 diabetes mellitus with diabetic nephropathy - Resume Glimepiride as prescribed and continue good nutrition practices and weight loss. Plan: POCT glycosylated hemoglobin (Hb A1C)  Left knee pain - Pt cannot have MRI due to pacemaker. She agrees to see ORTHO and let the specialist direct next step in imaging and treatment. Trial Tramadol 50 mg 1-2 tablets at bedtime; advised about potential for sedation. Plan: DG Knee Complete 4 Views Left, Ambulatory referral to Orthopedic Surgery

## 2014-12-18 ENCOUNTER — Encounter: Payer: BC Managed Care – PPO | Admitting: *Deleted

## 2014-12-18 ENCOUNTER — Telehealth: Payer: Self-pay | Admitting: Cardiology

## 2014-12-18 NOTE — Telephone Encounter (Signed)
LMOVM reminding pt to send remote transmission.   

## 2014-12-19 ENCOUNTER — Encounter: Payer: Self-pay | Admitting: Cardiology

## 2014-12-22 DIAGNOSIS — I495 Sick sinus syndrome: Secondary | ICD-10-CM

## 2014-12-22 LAB — MDC_IDC_ENUM_SESS_TYPE_REMOTE
Battery Impedance: 299 Ohm
Brady Statistic AP VP Percent: 0 %
Brady Statistic AP VS Percent: 0 %
Brady Statistic AS VP Percent: 0 %
Brady Statistic AS VS Percent: 100 %
Date Time Interrogation Session: 20160313160307
Lead Channel Impedance Value: 465 Ohm
Lead Channel Impedance Value: 557 Ohm
Lead Channel Pacing Threshold Amplitude: 0.5 V
Lead Channel Pacing Threshold Amplitude: 0.5 V
Lead Channel Pacing Threshold Pulse Width: 0.4 ms
Lead Channel Sensing Intrinsic Amplitude: 1.4 mV
Lead Channel Sensing Intrinsic Amplitude: 8 mV
Lead Channel Setting Pacing Amplitude: 2 V
Lead Channel Setting Sensing Sensitivity: 2.8 mV
MDC IDC MSMT BATTERY REMAINING LONGEVITY: 125 mo
MDC IDC MSMT BATTERY VOLTAGE: 2.79 V
MDC IDC MSMT LEADCHNL RV PACING THRESHOLD PULSEWIDTH: 0.4 ms
MDC IDC SET LEADCHNL RV PACING AMPLITUDE: 2.5 V
MDC IDC SET LEADCHNL RV PACING PULSEWIDTH: 0.4 ms

## 2014-12-23 ENCOUNTER — Ambulatory Visit (INDEPENDENT_AMBULATORY_CARE_PROVIDER_SITE_OTHER): Payer: BLUE CROSS/BLUE SHIELD | Admitting: *Deleted

## 2014-12-23 DIAGNOSIS — I495 Sick sinus syndrome: Secondary | ICD-10-CM

## 2014-12-23 NOTE — Progress Notes (Signed)
Remote pacemaker transmission.   

## 2014-12-25 ENCOUNTER — Encounter: Payer: Self-pay | Admitting: Family Medicine

## 2014-12-31 ENCOUNTER — Other Ambulatory Visit: Payer: Self-pay | Admitting: Family Medicine

## 2014-12-31 DIAGNOSIS — I5032 Chronic diastolic (congestive) heart failure: Secondary | ICD-10-CM

## 2014-12-31 DIAGNOSIS — I10 Essential (primary) hypertension: Secondary | ICD-10-CM

## 2015-01-07 ENCOUNTER — Telehealth: Payer: Self-pay

## 2015-01-07 DIAGNOSIS — I495 Sick sinus syndrome: Secondary | ICD-10-CM

## 2015-01-07 NOTE — Telephone Encounter (Signed)
Pt is unable to get her Rx from Mail order and is needing the following called in to Pharmacy;   citalopram (CELEXA) 20 MG tablet [[id number]]  gabapentin (NEURONTIN) 300 MG capsule [[id number]]  benazepril-hydrochlorthiazide (LOTENSIN HCT) 20-12.5 MG per tablet [[id number]]  losartan (COZAAR) 100 MG tablet [[id number]] metoprolol succinate (TOPROL-XL) 50 MG 24 hr tablet [[id number]]   [facility]: [facility] [address]

## 2015-01-08 MED ORDER — BENAZEPRIL-HYDROCHLOROTHIAZIDE 20-12.5 MG PO TABS: 1.0000 | ORAL_TABLET | Freq: Two times a day (BID) | ORAL | Status: DC

## 2015-01-08 MED ORDER — GABAPENTIN 300 MG PO CAPS: ORAL_CAPSULE | ORAL | Status: DC

## 2015-01-08 MED ORDER — LOSARTAN POTASSIUM 100 MG PO TABS: 100.0000 mg | ORAL_TABLET | Freq: Every day | ORAL | Status: DC

## 2015-01-08 MED ORDER — METOPROLOL SUCCINATE ER 50 MG PO TB24: 100.0000 mg | ORAL_TABLET | Freq: Every day | ORAL | Status: DC

## 2015-01-08 MED ORDER — CITALOPRAM HYDROBROMIDE 20 MG PO TABS: ORAL_TABLET | ORAL | Status: DC

## 2015-01-08 NOTE — Telephone Encounter (Signed)
Sent in RFs and notified pt. 

## 2015-01-14 ENCOUNTER — Encounter: Payer: Self-pay | Admitting: Cardiology

## 2015-01-21 ENCOUNTER — Telehealth: Payer: Self-pay

## 2015-01-21 MED ORDER — LISINOPRIL-HYDROCHLOROTHIAZIDE 20-12.5 MG PO TABS: 1.0000 | ORAL_TABLET | Freq: Every day | ORAL | Status: DC

## 2015-01-21 MED ORDER — LISINOPRIL-HYDROCHLOROTHIAZIDE 20-12.5 MG PO TABS: 1.0000 | ORAL_TABLET | Freq: Two times a day (BID) | ORAL | Status: DC

## 2015-01-21 NOTE — Telephone Encounter (Signed)
Lisinopril- HCT 20-12.5 mg 1 tab daily bid substituted for benazepril- HCT.

## 2015-01-21 NOTE — Telephone Encounter (Signed)
Got fax from pharm stating benazepril-HCTZ is on back order and asked for another medication to be sent in for pt.

## 2015-01-22 ENCOUNTER — Encounter: Payer: Self-pay | Admitting: Internal Medicine

## 2015-01-28 ENCOUNTER — Encounter: Payer: Self-pay | Admitting: Family Medicine

## 2015-02-14 ENCOUNTER — Ambulatory Visit (INDEPENDENT_AMBULATORY_CARE_PROVIDER_SITE_OTHER): Payer: BLUE CROSS/BLUE SHIELD | Admitting: Emergency Medicine

## 2015-02-14 VITALS — BP 120/60 | HR 66 | Temp 98.2°F | Resp 20 | Ht 63.5 in | Wt 235.5 lb

## 2015-02-14 DIAGNOSIS — S46811A Strain of other muscles, fascia and tendons at shoulder and upper arm level, right arm, initial encounter: Secondary | ICD-10-CM

## 2015-02-14 MED ORDER — TRAMADOL HCL 50 MG PO TABS: 50.0000 mg | ORAL_TABLET | Freq: Three times a day (TID) | ORAL | Status: DC | PRN

## 2015-02-14 MED ORDER — CYCLOBENZAPRINE HCL 5 MG PO TABS: 5.0000 mg | ORAL_TABLET | Freq: Three times a day (TID) | ORAL | Status: DC | PRN

## 2015-02-14 NOTE — Progress Notes (Signed)
  Patient has pain in right side of neck under ear.  Started yesterday No sore throat or dysphagia. No fever or chills No coryza or post nasal drainage No cough, wheezing or shortness of breath No pain with talking or chewing. No mass Pain increases when turns head. No neuro symptoms or radiation No improvement with over the counter medications or other home remedies.  Denies other complaint or health concern today.     Review of Systems:  Review of Systems  Constitutional: Negative for fever, chills and fatigue.  HENT: Negative for congestion, ear pain, hearing loss, postnasal drip, rhinorrhea and sinus pressure.   Eyes: Negative for discharge and redness.  Respiratory: Negative for cough, shortness of breath and wheezing.   Cardiovascular: Negative for chest pain and leg swelling.  Gastrointestinal: Negative for nausea, vomiting, abdominal pain, constipation and blood in stool.  Genitourinary: Negative for dysuria, urgency and frequency.  Musculoskeletal: Negative for neck stiffness.  Skin: Negative for rash.  Neurological: Negative for seizures, weakness and headaches.   Physical Examination: There were no vitals filed for this visit. There were no vitals filed for this visit. There is no height or weight on file to calculate BMI. Ideal Body Weight:     GEN: WDWN, NAD, Non-toxic, Alert & Oriented x 3 HEENT: Atraumatic, Normocephalic. TMJ normal Ears and Nose: No external deformity.  TM negative.  Oropharynx negative NECK:  Tender right trapezius EXTR: No clubbing/cyanosis/edema NEURO: Normal gait.  PSYCH: Normally interactive. Conversant. Not depressed or anxious appearing.  Calm demeanor.    Assessment and Plan: Cervical strain Anaprox Flexeril Consider TMJ or pharyngitis Follow up if no improvement   Signed Ellison Carwin, MD

## 2015-02-14 NOTE — Patient Instructions (Signed)

## 2015-02-14 NOTE — Progress Notes (Signed)
Called in Tramadol 50 mg prescription to the [facility] on [facility] by request for Dr. Ouida Sills today at 4:16pm.

## 2015-02-27 ENCOUNTER — Other Ambulatory Visit: Payer: Self-pay

## 2015-02-27 ENCOUNTER — Encounter: Payer: Self-pay | Admitting: Family Medicine

## 2015-02-27 DIAGNOSIS — I10 Essential (primary) hypertension: Secondary | ICD-10-CM

## 2015-03-01 ENCOUNTER — Encounter: Payer: Self-pay | Admitting: Family Medicine

## 2015-03-04 ENCOUNTER — Encounter: Payer: Self-pay | Admitting: Family Medicine

## 2015-03-04 ENCOUNTER — Telehealth: Payer: Self-pay | Admitting: *Deleted

## 2015-03-04 ENCOUNTER — Ambulatory Visit
Admission: RE | Admit: 2015-03-04 | Discharge: 2015-03-04 | Disposition: A | Discharge status: Home / Self Care | Payer: BLUE CROSS/BLUE SHIELD | Source: Ambulatory Visit | Attending: Family Medicine | Admitting: Family Medicine

## 2015-03-04 DIAGNOSIS — I10 Essential (primary) hypertension: Secondary | ICD-10-CM

## 2015-03-04 NOTE — Telephone Encounter (Signed)
I sent a message to pt via MyChart. Advised that renal US is normal. Renal Artery study is next  Month.

## 2015-03-04 NOTE — Telephone Encounter (Signed)
Called patient concerning renal ultrasound at [facility] and she had it today. The renal artery study will be on March 17 2015, per patient.

## 2015-03-06 ENCOUNTER — Ambulatory Visit (INDEPENDENT_AMBULATORY_CARE_PROVIDER_SITE_OTHER): Payer: BLUE CROSS/BLUE SHIELD | Admitting: Family Medicine

## 2015-03-06 ENCOUNTER — Encounter: Payer: Self-pay | Admitting: Family Medicine

## 2015-03-06 VITALS — BP 124/74 | HR 77 | Temp 98.3°F | Resp 16 | Ht 62.75 in | Wt 230.2 lb

## 2015-03-06 DIAGNOSIS — E1121 Type 2 diabetes mellitus with diabetic nephropathy: Secondary | ICD-10-CM | POA: Diagnosis not present

## 2015-03-06 DIAGNOSIS — Z45018 Encounter for adjustment and management of other part of cardiac pacemaker: Secondary | ICD-10-CM

## 2015-03-06 DIAGNOSIS — I495 Sick sinus syndrome: Secondary | ICD-10-CM | POA: Diagnosis not present

## 2015-03-06 DIAGNOSIS — R0602 Shortness of breath: Secondary | ICD-10-CM

## 2015-03-06 DIAGNOSIS — Z760 Encounter for issue of repeat prescription: Secondary | ICD-10-CM | POA: Diagnosis not present

## 2015-03-06 DIAGNOSIS — I10 Essential (primary) hypertension: Secondary | ICD-10-CM | POA: Diagnosis not present

## 2015-03-06 LAB — BASIC METABOLIC PANEL
BUN: 17 mg/dL (ref 6–23)
CO2: 26 mEq/L (ref 19–32)
Calcium: 10.1 mg/dL (ref 8.4–10.5)
Chloride: 103 mEq/L (ref 96–112)
Creat: 1.03 mg/dL (ref 0.50–1.10)
Glucose, Bld: 133 mg/dL — ABNORMAL HIGH (ref 70–99)
Potassium: 4.1 mEq/L (ref 3.5–5.3)
Sodium: 141 mEq/L (ref 135–145)

## 2015-03-06 LAB — POCT GLYCOSYLATED HEMOGLOBIN (HGB A1C): HEMOGLOBIN A1C: 6.7

## 2015-03-06 MED ORDER — BECLOMETHASONE DIPROPIONATE 80 MCG/ACT IN AERS: INHALATION_SPRAY | RESPIRATORY_TRACT | Status: DC

## 2015-03-06 MED ORDER — CITALOPRAM HYDROBROMIDE 20 MG PO TABS: ORAL_TABLET | ORAL | Status: DC

## 2015-03-06 MED ORDER — LOSARTAN POTASSIUM 100 MG PO TABS: 100.0000 mg | ORAL_TABLET | Freq: Every day | ORAL | Status: DC

## 2015-03-06 MED ORDER — TRIAMCINOLONE ACETONIDE 0.1 % EX CREA: 1.0000 "application " | TOPICAL_CREAM | CUTANEOUS | Status: DC | PRN

## 2015-03-06 MED ORDER — FLUTICASONE PROPIONATE 50 MCG/ACT NA SUSP: 2.0000 | Freq: Every day | NASAL | Status: DC

## 2015-03-06 MED ORDER — HYDROXYZINE HCL 25 MG PO TABS
25.0000 mg | ORAL_TABLET | Freq: Three times a day (TID) | ORAL | Status: DC | PRN
Start: 2015-03-06 — End: 2016-04-26

## 2015-03-06 MED ORDER — OMEPRAZOLE 20 MG PO CPDR: DELAYED_RELEASE_CAPSULE | ORAL | Status: DC

## 2015-03-06 MED ORDER — GABAPENTIN 300 MG PO CAPS: ORAL_CAPSULE | ORAL | Status: DC

## 2015-03-06 MED ORDER — LISINOPRIL-HYDROCHLOROTHIAZIDE 20-12.5 MG PO TABS: 1.0000 | ORAL_TABLET | Freq: Two times a day (BID) | ORAL | Status: DC

## 2015-03-06 MED ORDER — TRAMADOL HCL 50 MG PO TABS: 50.0000 mg | ORAL_TABLET | Freq: Three times a day (TID) | ORAL | Status: DC | PRN

## 2015-03-06 MED ORDER — GLUCOSE BLOOD VI STRP: ORAL_STRIP | Status: DC

## 2015-03-06 MED ORDER — METOPROLOL SUCCINATE ER 50 MG PO TB24: 100.0000 mg | ORAL_TABLET | Freq: Every day | ORAL | Status: DC

## 2015-03-06 MED ORDER — ONDANSETRON 8 MG PO TBDP: ORAL_TABLET | ORAL | Status: DC

## 2015-03-06 MED ORDER — GLIMEPIRIDE 4 MG PO TABS: ORAL_TABLET | ORAL | Status: DC

## 2015-03-06 NOTE — Patient Instructions (Signed)
You are due for complete physical exam in July or later this year. FOR APPOINTMENTS- Dr. Janett Billow Copland sees patients on Mondays as does Dr. Merri Ray, Dr. Reginia Forts is available on Mondays and Wednesdays and Dr. Delman Cheadle is available on Fridays.

## 2015-03-09 ENCOUNTER — Encounter: Payer: Self-pay | Admitting: Family Medicine

## 2015-03-09 NOTE — Progress Notes (Signed)
S:  This 59 y.o female is here for Type  II DM and HTN follow-up and medication review w/ refills > mail order service. Pt is compliant w/ medications w/ FSBS 100- 140. No hypoglycemia or BS > 200. She is scheduled for renal artery [place] to r/o RAS because she has hx of severe HTN and has never had this study. Weight is stable; pt has no exercise routine and nutrition is fair/good.  She denies fatigue, diaphoresis, abnormal weight gain, vision disturbances, CP or tightness, palpitations, edema, SOB or DOE, cough, HA, dizziness, numbness, weakness or syncope.  Patient Active Problem List   Diagnosis Date Noted  . Pacemaker Medtronic   . Ventricular tachycardia, nonsustained-during exercise testing 03/07/2013  . OSA (obstructive sleep apnea) 05/25/2011  . Hx of adenomatous colonic polyps 05/25/2011  . Migraine 05/25/2011  . DM2 (diabetes mellitus, type 2) 05/25/2011  . GERD (gastroesophageal reflux disease) 05/25/2011  . NAFLD (nonalcoholic fatty liver disease) 05/25/2011  . Hyperlipidemia 05/25/2011  . Severe essential hypertension 09/16/2009  . DIASTOLIC HEART FAILURE, CHRONIC 09/16/2009  . DYSPNEA 09/16/2009    Prior to Admission medications   Medication Sig Start Date End Date Taking? Authorizing Provider  beclomethasone (QVAR) 80 MCG/ACT inhaler Inhale 2 puffs twice a day. Rinse after use.   Yes Barton Fanny, MD  cetirizine (ZYRTEC) 10 MG tablet Take 10 mg by mouth daily.     Yes Historical Provider, MD  Cholecalciferol (VITAMIN D) 2000 UNITS CAPS Take by mouth.     Yes Historical Provider, MD  citalopram (CELEXA) 20 MG tablet TAKE 1 BY MOUTH DAILY   Yes Barton Fanny, MD  fish oil-omega-3 fatty acids 1000 MG capsule Take 2 g by mouth daily. PER PATIENT TAKE 8000 MG DAILY   Yes Historical Provider, MD  fluticasone (FLONASE) 50 MCG/ACT nasal spray Place 2 sprays into both nostrils daily.   Yes Barton Fanny, MD  gabapentin (NEURONTIN) 300 MG capsule TAKE 1 BY MOUTH IN THE  MORNING AND 2 AT BEDTIME   Yes Barton Fanny, MD  glimepiride (AMARYL) 4 MG tablet Take 1/2 tablet twice a day before main meals.   Yes Barton Fanny, MD  hydrOXYzine (ATARAX/VISTARIL) 25 MG tablet Take 1 tablet (25 mg total) by mouth every 8 (eight) hours as needed.   Yes Barton Fanny, MD  lisinopril-hydrochlorothiazide (PRINZIDE) 20-12.5 MG per tablet Take 1 tablet by mouth 2 (two) times daily.   Yes Barton Fanny, MD  losartan (COZAAR) 100 MG tablet Take 1 tablet (100 mg total) by mouth daily.   Yes Barton Fanny, MD  metoprolol succinate (TOPROL-XL) 50 MG 24 hr tablet Take 2 tablets (100 mg total) by mouth daily. Take with or immediately following a meal.   Yes Barton Fanny, MD  Multiple Vitamin (MULTIVITAMIN) tablet Take 1 tablet by mouth daily.     Yes Historical Provider, MD  omeprazole (PRILOSEC) 20 MG capsule TAKE 1 BY MOUTH DAILY   Yes Barton Fanny, MD  ondansetron (ZOFRAN-ODT) 8 MG disintegrating tablet DISSOLVE 1 ON TONGUE EVERY 12 HOURS AS NEEDED FOR NAUSEA   Yes Barton Fanny, MD  triamcinolone cream (KENALOG) 0.1 % Apply 1 application topically as needed.   Yes Barton Fanny, MD  albuterol (PROAIR HFA) 108 (90 BASE) MCG/ACT inhaler Inhale 2 puffs into the lungs as needed. 05/01/14   Barton Fanny, MD  ALPRAZolam Duanne Moron) 0.25 MG tablet Take 1 tablet once a day prn anxiety. Patient  not taking: Reported on 03/06/2015 05/01/14   Barton Fanny, MD  cyclobenzaprine (FLEXERIL) 5 MG tablet Take 1 tablet (5 mg total) by mouth 3 (three) times daily as needed for muscle spasms. Patient not taking: Reported on 03/06/2015 02/14/15   Roselee Culver, MD  glucose blood test strip Use as instructed    Barton Fanny, MD  traMADol (ULTRAM) 50 MG tablet Take 1 tablet (50 mg total) by mouth every 8 (eight) hours as needed.    Barton Fanny, MD  zoster vaccine live, PF, (ZOSTAVAX) 61607 UNT/0.65ML injection Inject 19,400 Units  into the skin once. 05/01/14   Barton Fanny, MD    SURG, SOC and FAM HX reviewed.  ROS: As per HPI.  O: Filed Vitals:   03/06/15 1436  BP: 124/74  Pulse: 77  Temp: 98.3 F (36.8 C)  Resp: 16   GEN: In NAD; WN,WD. HENT: [place]/AT; EOMI w/ clear conj/sclerae. Otherwise unremarkable. COR: RRR. LUNGSL Unlabored resp. SKIN: W&D. NEURO: A&O x 3; Cns intact. Nonfocal.  A/P: Type 2 diabetes mellitus with diabetic nephropathy - Plan: [facility] Diabetes Foot Exam, Basic metabolic panel, POCT glycosylated hemoglobin (Hb A1C)  Encounter for medication refill  Severe essential hypertension  DYSPNEA - Plan: beclomethasone (QVAR) 80 MCG/ACT inhaler  Sinoatrial node dysfunction - Plan: metoprolol succinate (TOPROL-XL) 50 MG 24 hr tablet   Meds ordered this encounter  Medications  . triamcinolone cream (KENALOG) 0.1 %    Sig: Apply 1 application topically as needed.    Dispense:  30 g    Refill:  2  . hydrOXYzine (ATARAX/VISTARIL) 25 MG tablet    Sig: Take 1 tablet (25 mg total) by mouth every 8 (eight) hours as needed.    Dispense:  30 tablet    Refill:  1  . glucose blood test strip    Sig: Use as instructed    Dispense:  200 each    Refill:  5    Provide strips to match pt's glucometer. Pt also needs lancets- # 200 with 5 refills.  . citalopram (CELEXA) 20 MG tablet    Sig: TAKE 1 BY MOUTH DAILY    Dispense:  90 tablet    Refill:  3  . beclomethasone (QVAR) 80 MCG/ACT inhaler    Sig: Inhale 2 puffs twice a day. Rinse after use.    Dispense:  3 Inhaler    Refill:  3  . fluticasone (FLONASE) 50 MCG/ACT nasal spray    Sig: Place 2 sprays into both nostrils daily.    Dispense:  48 g    Refill:  3  . gabapentin (NEURONTIN) 300 MG capsule    Sig: TAKE 1 BY MOUTH IN THE MORNING AND 2 AT BEDTIME    Dispense:  270 capsule    Refill:  3  . glimepiride (AMARYL) 4 MG tablet    Sig: Take 1/2 tablet twice a day before main meals.    Dispense:  90 tablet    Refill:  3  .  lisinopril-hydrochlorothiazide (PRINZIDE) 20-12.5 MG per tablet    Sig: Take 1 tablet by mouth 2 (two) times daily.    Dispense:  90 tablet    Refill:  3  . losartan (COZAAR) 100 MG tablet    Sig: Take 1 tablet (100 mg total) by mouth daily.    Dispense:  90 tablet    Refill:  3  . metoprolol succinate (TOPROL-XL) 50 MG 24 hr tablet    Sig: Take 2  tablets (100 mg total) by mouth daily. Take with or immediately following a meal.    Dispense:  180 tablet    Refill:  3  . omeprazole (PRILOSEC) 20 MG capsule    Sig: TAKE 1 BY MOUTH DAILY    Dispense:  90 capsule    Refill:  3  . ondansetron (ZOFRAN-ODT) 8 MG disintegrating tablet    Sig: DISSOLVE 1 ON TONGUE EVERY 12 HOURS AS NEEDED FOR NAUSEA    Dispense:  90 tablet    Refill:  3  . traMADol (ULTRAM) 50 MG tablet    Sig: Take 1 tablet (50 mg total) by mouth every 8 (eight) hours as needed.    Dispense:  60 tablet    Refill:  3

## 2015-03-10 ENCOUNTER — Telehealth: Payer: Self-pay

## 2015-03-10 NOTE — Telephone Encounter (Signed)
PA needed for QVAR. Completed on covermymeds. It looks like ins prefers Asmanex and Flovent Diskus and HFA, neither of which pt has tried previously, according to EP[employer]. Pending.

## 2015-03-12 MED ORDER — FLUTICASONE PROPIONATE HFA 44 MCG/ACT IN AERO: 2.0000 | INHALATION_SPRAY | Freq: Two times a day (BID) | RESPIRATORY_TRACT | Status: DC

## 2015-03-12 NOTE — Telephone Encounter (Signed)
PA denied. Can we change Rx to Asmanex or Flovent Diskus or HFA?

## 2015-03-12 NOTE — Telephone Encounter (Signed)
I've stopped the qvar and sent in flovent HFA 2 puffs bid.

## 2015-03-13 ENCOUNTER — Other Ambulatory Visit: Payer: BLUE CROSS/BLUE SHIELD

## 2015-03-13 ENCOUNTER — Encounter: Payer: Self-pay | Admitting: *Deleted

## 2015-03-17 ENCOUNTER — Ambulatory Visit
Admission: RE | Admit: 2015-03-17 | Discharge: 2015-03-17 | Disposition: A | Discharge status: Home / Self Care | Payer: BLUE CROSS/BLUE SHIELD | Source: Ambulatory Visit | Attending: Family Medicine | Admitting: Family Medicine

## 2015-03-17 DIAGNOSIS — I5032 Chronic diastolic (congestive) heart failure: Secondary | ICD-10-CM

## 2015-03-17 DIAGNOSIS — I10 Essential (primary) hypertension: Secondary | ICD-10-CM

## 2015-03-25 ENCOUNTER — Telehealth: Payer: Self-pay | Admitting: Cardiology

## 2015-03-25 ENCOUNTER — Ambulatory Visit (INDEPENDENT_AMBULATORY_CARE_PROVIDER_SITE_OTHER): Payer: BLUE CROSS/BLUE SHIELD | Admitting: *Deleted

## 2015-03-25 DIAGNOSIS — I495 Sick sinus syndrome: Secondary | ICD-10-CM | POA: Diagnosis not present

## 2015-03-25 NOTE — Telephone Encounter (Signed)
Spoke with pt and reminded pt of remote transmission that is due today. Pt verbalized understanding.   

## 2015-03-26 NOTE — Progress Notes (Signed)
Remote pacemaker transmission.   

## 2015-03-30 LAB — CUP PACEART REMOTE DEVICE CHECK
Battery Remaining Longevity: 125 mo
Battery Voltage: 2.79 V
Brady Statistic AP VP Percent: 0 %
Brady Statistic AS VS Percent: 100 %
Date Time Interrogation Session: 20160614222435
Lead Channel Impedance Value: 466 Ohm
Lead Channel Impedance Value: 546 Ohm
Lead Channel Pacing Threshold Amplitude: 0.5 V
Lead Channel Pacing Threshold Pulse Width: 0.4 ms
Lead Channel Sensing Intrinsic Amplitude: 2.8 mV
Lead Channel Sensing Intrinsic Amplitude: 8 mV
Lead Channel Setting Pacing Amplitude: 2 V
Lead Channel Setting Pacing Pulse Width: 0.4 ms
Lead Channel Setting Sensing Sensitivity: 4 mV
MDC IDC MSMT BATTERY IMPEDANCE: 298 Ohm
MDC IDC MSMT LEADCHNL RV PACING THRESHOLD AMPLITUDE: 0.625 V
MDC IDC MSMT LEADCHNL RV PACING THRESHOLD PULSEWIDTH: 0.4 ms
MDC IDC SET LEADCHNL RV PACING AMPLITUDE: 2.5 V
MDC IDC STAT BRADY AP VS PERCENT: 0 %
MDC IDC STAT BRADY AS VP PERCENT: 0 %

## 2015-04-18 ENCOUNTER — Encounter: Payer: Self-pay | Admitting: Cardiology

## 2015-04-24 ENCOUNTER — Encounter: Payer: Self-pay | Admitting: Internal Medicine

## 2015-05-01 ENCOUNTER — Telehealth: Payer: Self-pay

## 2015-05-01 NOTE — Telephone Encounter (Signed)
Patient is calling because she was prescribed the wrong quantity for lisinopril. It's supposed to be exactly double the supply. She would like a refill sent to [facility] on [address]. Patient phone:  [phone]

## 2015-05-02 NOTE — Telephone Encounter (Signed)
Left message for pt to call back  °

## 2015-05-05 NOTE — Telephone Encounter (Signed)
Left message for pt to call back  °

## 2015-05-06 MED ORDER — LISINOPRIL-HYDROCHLOROTHIAZIDE 20-12.5 MG PO TABS
1.0000 | ORAL_TABLET | Freq: Two times a day (BID) | ORAL | Status: DC
Start: 2015-05-06 — End: 2015-07-14

## 2015-05-06 NOTE — Telephone Encounter (Signed)
Pt sent faxed req for RF with new contact number [phone]. [place] and has not gotten our messages to CB. She req'd that I send a 30 day RF to [facility] and she will call back when she gets some confusion over her ins straightened out to resend corrected RFs for 90 days to [place].

## 2015-05-07 ENCOUNTER — Telehealth: Payer: Self-pay | Admitting: *Deleted

## 2015-05-07 NOTE — Telephone Encounter (Signed)
Received faxed mammo report dated 05/20/14 - No mammographic abnormalities.  Updated health maintenance, abstracted & sent to PCP for review.

## 2015-05-07 NOTE — Telephone Encounter (Signed)
Phoned & spoke with Claiborne Billings at [facility] & she is faxing patient's most recent mammo report (05/20/14).  Will update health maintenance once received.

## 2015-05-15 ENCOUNTER — Encounter: Payer: Self-pay | Admitting: Cardiology

## 2015-06-25 ENCOUNTER — Telehealth: Payer: Self-pay | Admitting: Cardiology

## 2015-06-25 ENCOUNTER — Encounter: Payer: Self-pay | Admitting: *Deleted

## 2015-06-25 NOTE — Telephone Encounter (Signed)
Spoke with pt and reminded pt of remote transmission that is due today. Pt verbalized understanding.   

## 2015-06-26 ENCOUNTER — Encounter: Payer: Self-pay | Admitting: Cardiology

## 2015-06-30 ENCOUNTER — Ambulatory Visit (INDEPENDENT_AMBULATORY_CARE_PROVIDER_SITE_OTHER): Payer: Self-pay | Admitting: *Deleted

## 2015-06-30 DIAGNOSIS — I495 Sick sinus syndrome: Secondary | ICD-10-CM

## 2015-07-01 NOTE — Progress Notes (Signed)
Remote pacemaker transmission.   

## 2015-07-08 LAB — CUP PACEART REMOTE DEVICE CHECK
Battery Remaining Longevity: 119 mo
Battery Voltage: 2.79 V
Brady Statistic AP VP Percent: 0 %
Brady Statistic AP VS Percent: 0 %
Brady Statistic AS VP Percent: 0 %
Brady Statistic AS VS Percent: 100 %
Date Time Interrogation Session: 20160919135107
Lead Channel Impedance Value: 568 Ohm
Lead Channel Pacing Threshold Amplitude: 0.5 V
Lead Channel Pacing Threshold Amplitude: 0.5 V
Lead Channel Pacing Threshold Pulse Width: 0.4 ms
Lead Channel Pacing Threshold Pulse Width: 0.4 ms
Lead Channel Setting Pacing Amplitude: 2 V
Lead Channel Setting Pacing Amplitude: 2.5 V
Lead Channel Setting Pacing Pulse Width: 0.4 ms
Lead Channel Setting Sensing Sensitivity: 4 mV
MDC IDC MSMT BATTERY IMPEDANCE: 346 Ohm
MDC IDC MSMT LEADCHNL RA IMPEDANCE VALUE: 480 Ohm
MDC IDC MSMT LEADCHNL RA SENSING INTR AMPL: 1.4 mV
MDC IDC MSMT LEADCHNL RV SENSING INTR AMPL: 8 mV

## 2015-07-14 ENCOUNTER — Other Ambulatory Visit: Payer: Self-pay

## 2015-07-14 DIAGNOSIS — I495 Sick sinus syndrome: Secondary | ICD-10-CM

## 2015-07-14 MED ORDER — GABAPENTIN 300 MG PO CAPS: ORAL_CAPSULE | ORAL | Status: DC

## 2015-07-14 MED ORDER — CITALOPRAM HYDROBROMIDE 20 MG PO TABS: ORAL_TABLET | ORAL | Status: DC

## 2015-07-14 MED ORDER — LOSARTAN POTASSIUM 100 MG PO TABS: 100.0000 mg | ORAL_TABLET | Freq: Every day | ORAL | Status: DC

## 2015-07-14 MED ORDER — OMEPRAZOLE 20 MG PO CPDR: DELAYED_RELEASE_CAPSULE | ORAL | Status: DC

## 2015-07-14 MED ORDER — METOPROLOL SUCCINATE ER 50 MG PO TB24: 100.0000 mg | ORAL_TABLET | Freq: Every day | ORAL | Status: DC

## 2015-07-14 MED ORDER — LISINOPRIL-HYDROCHLOROTHIAZIDE 20-12.5 MG PO TABS: 1.0000 | ORAL_TABLET | Freq: Two times a day (BID) | ORAL | Status: DC

## 2015-07-22 ENCOUNTER — Encounter: Payer: Self-pay | Admitting: Cardiology

## 2015-07-31 ENCOUNTER — Encounter: Payer: Self-pay | Admitting: Internal Medicine

## 2015-08-05 ENCOUNTER — Encounter: Payer: Self-pay | Admitting: Cardiology

## 2015-10-23 ENCOUNTER — Encounter: Payer: Self-pay | Admitting: Internal Medicine

## 2015-10-23 ENCOUNTER — Ambulatory Visit (INDEPENDENT_AMBULATORY_CARE_PROVIDER_SITE_OTHER): Payer: BLUE CROSS/BLUE SHIELD | Admitting: Internal Medicine

## 2015-10-23 VITALS — BP 118/76 | HR 80 | Ht 62.0 in | Wt 230.4 lb

## 2015-10-23 DIAGNOSIS — Z950 Presence of cardiac pacemaker: Secondary | ICD-10-CM

## 2015-10-23 DIAGNOSIS — I495 Sick sinus syndrome: Secondary | ICD-10-CM

## 2015-10-23 LAB — CUP PACEART INCLINIC DEVICE CHECK
Battery Impedance: 346 Ohm
Battery Remaining Longevity: 119 mo
Battery Voltage: 2.79 V
Brady Statistic AP VP Percent: 0 %
Brady Statistic AS VP Percent: 0 %
Brady Statistic AS VS Percent: 100 %
Date Time Interrogation Session: 20170112180805
Implantable Lead Implant Date: 20091201
Implantable Lead Location: 753859
Implantable Lead Location: 753860
Implantable Lead Model: 5076
Implantable Lead Model: 5076
Lead Channel Impedance Value: 459 Ohm
Lead Channel Impedance Value: 552 Ohm
Lead Channel Pacing Threshold Amplitude: 0.5 V
Lead Channel Pacing Threshold Pulse Width: 0.4 ms
Lead Channel Sensing Intrinsic Amplitude: 4 mV
Lead Channel Setting Pacing Amplitude: 2.5 V
Lead Channel Setting Pacing Pulse Width: 0.4 ms
Lead Channel Setting Sensing Sensitivity: 4 mV
MDC IDC LEAD IMPLANT DT: 20091201
MDC IDC MSMT LEADCHNL RV PACING THRESHOLD AMPLITUDE: 0.5 V
MDC IDC MSMT LEADCHNL RV PACING THRESHOLD PULSEWIDTH: 0.4 ms
MDC IDC MSMT LEADCHNL RV SENSING INTR AMPL: 11.2 mV
MDC IDC SET LEADCHNL RA PACING AMPLITUDE: 2 V
MDC IDC STAT BRADY AP VS PERCENT: 0 %

## 2015-10-23 NOTE — Patient Instructions (Signed)
Medication Instructions: - no changes  Labwork: - none  Procedures/Testing: - none  Follow-Up: - Remote monitoring is used to monitor your Pacemaker of ICD from home. This monitoring reduces the number of office visits required to check your device to one time per year. It allows [place] to keep an eye on the functioning of your device to ensure it is working properly. You are scheduled for a device check from home on 01/22/16. You may send your transmission at any time that day. If you have a wireless device, the transmission will be sent automatically. After your physician reviews your transmission, you will receive a postcard with your next transmission date.  - Your physician wants you to follow-up in: 1 year with Chanetta Marshall, NP You will receive a reminder letter in the mail two months in advance. If you don't receive a letter, please call our office to schedule the follow-up appointment.  Any Additional Special Instructions Will Be Listed Below (If Applicable).

## 2015-10-23 NOTE — Progress Notes (Signed)
Patient Care Team: Barton Fanny, MD as PCP - General (Family Medicine) Bryson Ha Himmelrich, RD as Dietitian Deboraha Sprang, MD as Attending Physician (Cardiology) Marygrace Drought, MD as Consulting Physician (Ophthalmology) Vickey Huger, MD as Consulting Physician (Orthopedic Surgery)   HPI  [name] [name] is a 60 y.o. female With a history of Pacing for sinus node dysfunction. She paces almost never. This has been unaccompanied by edema. She underwent Myoview scanning which was negative but was notable for significant nonsustained ventricular tachycardia. Left ventricular function was normal.  The patient denies chest pain, shortness of breath, nocturnal dyspnea, orthopnea or peripheral edema.  There have been no palpitations, lightheadedness or syncope.   yRx OSA  Past Medical History  Diagnosis Date  . Fatty liver 04/2011  . Hypertension severe   . OSA (obstructive sleep apnea)     C-Pap  . Obesity   . Asthma   . Migraine   . Anal fissure   . Colon polyps     diverticulosis  03-2011/2005  . Diverticulosis   . GERD (gastroesophageal reflux disease)   . Hyperlipidemia   . Diabetes mellitus 2012    T2DM  . Sinoatrial node dysfunction (HCC)     syncope  . Allergy   . Arthritis   . Depression   . Anxiety   . Pacemaker Medtronic   . Cataract 2010     left    Past Surgical History  Procedure Laterality Date  . Pacemaker placement    . Oophorectomy    . Tonsillectomy    . Eye surgery Right 2010  . Abdominal hysterectomy  2000    total  . T/a right knee      Current Outpatient Prescriptions  Medication Sig Dispense Refill  . albuterol (PROAIR HFA) 108 (90 BASE) MCG/ACT inhaler Inhale 2 puffs into the lungs as needed. 2 Inhaler 3  . cetirizine (ZYRTEC) 10 MG tablet Take 10 mg by mouth daily.      . Cholecalciferol (VITAMIN D) 2000 UNITS CAPS Take by mouth.      . citalopram (CELEXA) 20 MG tablet TAKE 1 BY MOUTH DAILY 90 tablet 2  . fish oil-omega-3  fatty acids 1000 MG capsule Take 2 g by mouth daily. PER PATIENT TAKE 8000 MG DAILY    . fluticasone (FLONASE) 50 MCG/ACT nasal spray Place 2 sprays into both nostrils daily. 48 g 3  . fluticasone (FLOVENT HFA) 44 MCG/ACT inhaler Inhale 2 puffs into the lungs 2 (two) times daily. 1 Inhaler 12  . gabapentin (NEURONTIN) 300 MG capsule TAKE 1 BY MOUTH IN THE MORNING AND 2 AT BEDTIME 270 capsule 2  . glimepiride (AMARYL) 4 MG tablet Take 1/2 tablet twice a day before main meals. 90 tablet 3  . glucose blood test strip Use as instructed 200 each 5  . hydrOXYzine (ATARAX/VISTARIL) 25 MG tablet Take 1 tablet (25 mg total) by mouth every 8 (eight) hours as needed. 30 tablet 1  . lisinopril-hydrochlorothiazide (PRINZIDE,ZESTORETIC) 20-12.5 MG tablet Take 1 tablet by mouth 2 (two) times daily. 180 tablet 2  . losartan (COZAAR) 100 MG tablet Take 1 tablet (100 mg total) by mouth daily. 90 tablet 2  . metoprolol succinate (TOPROL-XL) 50 MG 24 hr tablet Take 2 tablets (100 mg total) by mouth daily. Take with or immediately following a meal. 180 tablet 2  . Multiple Vitamin (MULTIVITAMIN) tablet Take 1 tablet by mouth daily.      Marland Kitchen omeprazole (PRILOSEC) 20  MG capsule TAKE 1 BY MOUTH DAILY 90 capsule 2  . ondansetron (ZOFRAN-ODT) 8 MG disintegrating tablet DISSOLVE 1 ON TONGUE EVERY 12 HOURS AS NEEDED FOR NAUSEA 90 tablet 3  . traMADol (ULTRAM) 50 MG tablet Take 1 tablet (50 mg total) by mouth every 8 (eight) hours as needed. 60 tablet 3  . triamcinolone cream (KENALOG) 0.1 % Apply 1 application topically as needed. 30 g 2  . zoster vaccine live, PF, (ZOSTAVAX) 42595 UNT/0.65ML injection Inject 19,400 Units into the skin once. 1 each 0   No current facility-administered medications for this visit.    Allergies  Allergen Reactions  . Adhesive [Tape] Other (See Comments)    SKIN BLISTERS  . Penicillins   . Red Yeast Rice Hives  . Sulfa Drugs Cross Reactors Hives    Review of Systems negative except from  HPI and PMH  Physical Exam BP 118/76 mmHg  Pulse 80  Ht 5\' 2"  (1.575 m)  Wt 230 lb 6.4 oz (104.509 kg)  BMI 42.13 kg/m2 Well developed and well nourished in no acute distress HENT normal E scleral and icterus clear Neck Supple JVP flat; carotids brisk and full Clear to ausculation Device pocket well healed; without hematoma or erythema.  There is no tethering  Regular rate and rhythm, no murmurs gallops or rub Soft with active bowel sounds No clubbing cyanosis none Edema Alert and oriented, grossly normal motor and sensory function Skin Warm and Dry  ECG  71 19/11/44  Assessment and  Plan  Hypertension  HFpEF  Syncope  OSA  Pacemaker-Medtronic  The patient's device was interrogated.  The information was reviewed. No changes were made in the programming.     Sinus node dysfunction Euvolemic continue current meds  BP control  Sleep apnea on CPAP and card read recently  sbe feels great and is enjoying being an aunt and great aunt

## 2015-10-29 ENCOUNTER — Ambulatory Visit (INDEPENDENT_AMBULATORY_CARE_PROVIDER_SITE_OTHER): Payer: BLUE CROSS/BLUE SHIELD | Admitting: Podiatry

## 2015-10-29 ENCOUNTER — Encounter: Payer: Self-pay | Admitting: Podiatry

## 2015-10-29 VITALS — BP 111/63 | HR 62 | Resp 16

## 2015-10-29 DIAGNOSIS — M722 Plantar fascial fibromatosis: Secondary | ICD-10-CM | POA: Diagnosis not present

## 2015-10-29 DIAGNOSIS — L600 Ingrowing nail: Secondary | ICD-10-CM

## 2015-10-29 MED ORDER — TRIAMCINOLONE ACETONIDE 10 MG/ML IJ SUSP
10.0000 mg | Freq: Once | INTRAMUSCULAR | Status: AC
  Administered 2015-10-29: 10 mg

## 2015-10-29 NOTE — Progress Notes (Signed)
Subjective:     Patient ID: [name] [name], female   DOB: [birth year], 60 y.o.   MRN: [id number]  HPI patient presents stating I'm having a lot of pain in my left ingrown toenail and also in my left arch and I know in need new orthotics   Review of Systems     Objective:   Physical Exam Neurovascular status unchanged with incurvated left hallux lateral border that's painful when pressed and also pain in the plantar mid arch area left with fluid buildup. Moderate depression of the arch is noted upon weightbearing    Assessment:     Plantar fasciitis left with ingrown toenail deformity hallux left and moderate change in the arch height    Plan:     H&P and x-rays and conditions reviewed. Work and affixing ingrown toenail and explained risk and today I infiltrated 60 mg I can Marcaine mixture remove the lateral border exposed matrix and applied phenol 3 applications 30 seconds followed by alcohol lavage and sterile dressing. I injected the mid arch area left 3 mg Kenalog 5 mg Xylocaine and went ahead and scanned for custom orthotic devices

## 2015-10-29 NOTE — Patient Instructions (Signed)

## 2015-11-06 ENCOUNTER — Telehealth: Payer: Self-pay | Admitting: *Deleted

## 2015-11-06 NOTE — Telephone Encounter (Signed)
Called patient at [phone] (Cell #) to check to see how they were doing from their ingrown toenail procedure that was performed on Wednesday, October 29, 2015. Pt stated, "Doing good, but still a little sore". Pt is soaking their toes with some relief.

## 2015-12-16 ENCOUNTER — Ambulatory Visit: Payer: BLUE CROSS/BLUE SHIELD | Admitting: *Deleted

## 2015-12-16 DIAGNOSIS — M722 Plantar fascial fibromatosis: Secondary | ICD-10-CM

## 2015-12-16 NOTE — Progress Notes (Signed)
Patient ID: [name] [name], female   DOB: [birth year], 60 y.o.   MRN: [id number] Patient presents for orthotic pick up.  Verbal and written break in and wear instructions given.  Patient will follow up in 4 weeks if symptoms worsen or fail to improve.

## 2015-12-16 NOTE — Patient Instructions (Signed)

## 2015-12-23 LAB — HM DIABETES EYE EXAM

## 2015-12-25 ENCOUNTER — Telehealth: Payer: Self-pay

## 2016-01-07 ENCOUNTER — Encounter: Payer: Self-pay | Admitting: Family Medicine

## 2016-01-22 ENCOUNTER — Ambulatory Visit (INDEPENDENT_AMBULATORY_CARE_PROVIDER_SITE_OTHER): Payer: BLUE CROSS/BLUE SHIELD | Admitting: *Deleted

## 2016-01-22 DIAGNOSIS — I495 Sick sinus syndrome: Secondary | ICD-10-CM

## 2016-01-26 NOTE — Progress Notes (Signed)
Remote pacemaker transmission.   

## 2016-03-02 ENCOUNTER — Encounter: Payer: Self-pay | Admitting: Cardiology

## 2016-03-02 LAB — CUP PACEART REMOTE DEVICE CHECK
Battery Impedance: 370 Ohm
Battery Remaining Longevity: 117 mo
Battery Voltage: 2.78 V
Brady Statistic AS VP Percent: 0 %
Brady Statistic AS VS Percent: 100 %
Date Time Interrogation Session: 20170414000706
Implantable Lead Implant Date: 20091201
Implantable Lead Location: 753860
Implantable Lead Model: 5076
Lead Channel Impedance Value: 487 Ohm
Lead Channel Pacing Threshold Amplitude: 0.5 V
Lead Channel Sensing Intrinsic Amplitude: 1.4 mV
Lead Channel Sensing Intrinsic Amplitude: 8 mV
Lead Channel Setting Pacing Amplitude: 2.5 V
Lead Channel Setting Pacing Pulse Width: 0.4 ms
Lead Channel Setting Sensing Sensitivity: 4 mV
MDC IDC LEAD IMPLANT DT: 20091201
MDC IDC LEAD LOCATION: 753859
MDC IDC MSMT LEADCHNL RA PACING THRESHOLD AMPLITUDE: 0.5 V
MDC IDC MSMT LEADCHNL RA PACING THRESHOLD PULSEWIDTH: 0.4 ms
MDC IDC MSMT LEADCHNL RV IMPEDANCE VALUE: 531 Ohm
MDC IDC MSMT LEADCHNL RV PACING THRESHOLD PULSEWIDTH: 0.4 ms
MDC IDC SET LEADCHNL RA PACING AMPLITUDE: 2 V
MDC IDC STAT BRADY AP VP PERCENT: 0 %
MDC IDC STAT BRADY AP VS PERCENT: 0 %

## 2016-04-22 ENCOUNTER — Telehealth: Payer: Self-pay | Admitting: Cardiology

## 2016-04-22 ENCOUNTER — Ambulatory Visit: Payer: BLUE CROSS/BLUE SHIELD | Admitting: *Deleted

## 2016-04-22 NOTE — Telephone Encounter (Signed)
Attempted to confirm remote transmission with pt. No answer and was unable to leave a message.   

## 2016-04-23 ENCOUNTER — Encounter: Payer: Self-pay | Admitting: Cardiology

## 2016-04-24 LAB — CUP PACEART REMOTE DEVICE CHECK
Brady Statistic AP VP Percent: 0 %
Brady Statistic AP VS Percent: 0 %
Brady Statistic AS VP Percent: 0 %
Date Time Interrogation Session: 20170715093634
Implantable Lead Implant Date: 20091201
Implantable Lead Location: 753860
Implantable Lead Model: 5076
Lead Channel Impedance Value: 434 Ohm
Lead Channel Pacing Threshold Amplitude: 0.375 V
Lead Channel Pacing Threshold Amplitude: 0.625 V
Lead Channel Pacing Threshold Pulse Width: 0.4 ms
Lead Channel Pacing Threshold Pulse Width: 0.4 ms
Lead Channel Setting Pacing Amplitude: 2 V
Lead Channel Setting Sensing Sensitivity: 4 mV
MDC IDC LEAD IMPLANT DT: 20091201
MDC IDC LEAD LOCATION: 753859
MDC IDC MSMT BATTERY IMPEDANCE: 419 Ohm
MDC IDC MSMT BATTERY REMAINING LONGEVITY: 111 mo
MDC IDC MSMT BATTERY VOLTAGE: 2.79 V
MDC IDC MSMT LEADCHNL RV IMPEDANCE VALUE: 578 Ohm
MDC IDC SET LEADCHNL RV PACING AMPLITUDE: 2.5 V
MDC IDC SET LEADCHNL RV PACING PULSEWIDTH: 0.4 ms
MDC IDC STAT BRADY AS VS PERCENT: 100 %

## 2016-04-26 ENCOUNTER — Ambulatory Visit (INDEPENDENT_AMBULATORY_CARE_PROVIDER_SITE_OTHER): Payer: BLUE CROSS/BLUE SHIELD | Admitting: *Deleted

## 2016-04-26 ENCOUNTER — Ambulatory Visit (INDEPENDENT_AMBULATORY_CARE_PROVIDER_SITE_OTHER): Payer: BLUE CROSS/BLUE SHIELD | Admitting: Physician Assistant

## 2016-04-26 VITALS — BP 116/78 | HR 101 | Temp 98.1°F | Resp 18 | Ht 62.0 in | Wt 237.2 lb

## 2016-04-26 DIAGNOSIS — E119 Type 2 diabetes mellitus without complications: Secondary | ICD-10-CM

## 2016-04-26 DIAGNOSIS — I495 Sick sinus syndrome: Secondary | ICD-10-CM | POA: Diagnosis not present

## 2016-04-26 DIAGNOSIS — L500 Allergic urticaria: Secondary | ICD-10-CM | POA: Insufficient documentation

## 2016-04-26 DIAGNOSIS — J4540 Moderate persistent asthma, uncomplicated: Secondary | ICD-10-CM

## 2016-04-26 DIAGNOSIS — I10 Essential (primary) hypertension: Secondary | ICD-10-CM

## 2016-04-26 DIAGNOSIS — K760 Fatty (change of) liver, not elsewhere classified: Secondary | ICD-10-CM

## 2016-04-26 DIAGNOSIS — Z91048 Other nonmedicinal substance allergy status: Secondary | ICD-10-CM

## 2016-04-26 DIAGNOSIS — E1142 Type 2 diabetes mellitus with diabetic polyneuropathy: Secondary | ICD-10-CM | POA: Diagnosis not present

## 2016-04-26 DIAGNOSIS — F419 Anxiety disorder, unspecified: Secondary | ICD-10-CM | POA: Insufficient documentation

## 2016-04-26 DIAGNOSIS — Z86010 Personal history of colonic polyps: Secondary | ICD-10-CM | POA: Diagnosis not present

## 2016-04-26 DIAGNOSIS — G43109 Migraine with aura, not intractable, without status migrainosus: Secondary | ICD-10-CM | POA: Diagnosis not present

## 2016-04-26 DIAGNOSIS — G4733 Obstructive sleep apnea (adult) (pediatric): Secondary | ICD-10-CM | POA: Diagnosis not present

## 2016-04-26 DIAGNOSIS — R0602 Shortness of breath: Secondary | ICD-10-CM | POA: Diagnosis not present

## 2016-04-26 DIAGNOSIS — J45909 Unspecified asthma, uncomplicated: Secondary | ICD-10-CM | POA: Insufficient documentation

## 2016-04-26 DIAGNOSIS — E785 Hyperlipidemia, unspecified: Secondary | ICD-10-CM

## 2016-04-26 DIAGNOSIS — Z13220 Encounter for screening for lipoid disorders: Secondary | ICD-10-CM

## 2016-04-26 DIAGNOSIS — Z9109 Other allergy status, other than to drugs and biological substances: Secondary | ICD-10-CM

## 2016-04-26 DIAGNOSIS — K219 Gastro-esophageal reflux disease without esophagitis: Secondary | ICD-10-CM | POA: Diagnosis not present

## 2016-04-26 DIAGNOSIS — M542 Cervicalgia: Secondary | ICD-10-CM

## 2016-04-26 LAB — POCT GLYCOSYLATED HEMOGLOBIN (HGB A1C): Hemoglobin A1C: 7.4

## 2016-04-26 LAB — GLUCOSE, POCT (MANUAL RESULT ENTRY): POC Glucose: 140 mg/dl — AB (ref 70–99)

## 2016-04-26 MED ORDER — LOSARTAN POTASSIUM 100 MG PO TABS: 100.0000 mg | ORAL_TABLET | Freq: Every day | ORAL | Status: DC

## 2016-04-26 MED ORDER — GABAPENTIN 300 MG PO CAPS: ORAL_CAPSULE | ORAL | Status: DC

## 2016-04-26 MED ORDER — HYDROXYZINE HCL 25 MG PO TABS: 25.0000 mg | ORAL_TABLET | Freq: Three times a day (TID) | ORAL | Status: DC | PRN

## 2016-04-26 MED ORDER — FLUTICASONE PROPIONATE 50 MCG/ACT NA SUSP: 2.0000 | Freq: Every day | NASAL | Status: DC

## 2016-04-26 MED ORDER — TRAMADOL HCL 50 MG PO TABS: 50.0000 mg | ORAL_TABLET | Freq: Three times a day (TID) | ORAL | Status: DC | PRN

## 2016-04-26 MED ORDER — OMEPRAZOLE 20 MG PO CPDR: DELAYED_RELEASE_CAPSULE | ORAL | Status: DC

## 2016-04-26 MED ORDER — TRIAMCINOLONE ACETONIDE 0.1 % EX CREA: 1.0000 "application " | TOPICAL_CREAM | CUTANEOUS | Status: DC | PRN

## 2016-04-26 MED ORDER — CITALOPRAM HYDROBROMIDE 20 MG PO TABS: ORAL_TABLET | ORAL | Status: DC

## 2016-04-26 MED ORDER — ONDANSETRON 8 MG PO TBDP: ORAL_TABLET | ORAL | Status: DC

## 2016-04-26 MED ORDER — METOPROLOL SUCCINATE ER 50 MG PO TB24: 100.0000 mg | ORAL_TABLET | Freq: Every day | ORAL | Status: DC

## 2016-04-26 MED ORDER — ALBUTEROL SULFATE HFA 108 (90 BASE) MCG/ACT IN AERS: 2.0000 | INHALATION_SPRAY | RESPIRATORY_TRACT | Status: DC | PRN

## 2016-04-26 MED ORDER — GLIMEPIRIDE 4 MG PO TABS: ORAL_TABLET | ORAL | Status: DC

## 2016-04-26 MED ORDER — LISINOPRIL-HYDROCHLOROTHIAZIDE 20-12.5 MG PO TABS: 1.0000 | ORAL_TABLET | Freq: Two times a day (BID) | ORAL | Status: DC

## 2016-04-26 MED ORDER — FLUTICASONE PROPIONATE HFA 110 MCG/ACT IN AERO: 2.0000 | INHALATION_SPRAY | Freq: Two times a day (BID) | RESPIRATORY_TRACT | Status: DC

## 2016-04-26 NOTE — Patient Instructions (Addendum)
Have a bigger breakfast, have a protein snack around 9:45 or 10 AM Continue meds as prescribed. Your flovent dose has been doubled. Try this for the rest of the summer. During the cooler months you can try 1 puff twice a day and see if that controls your symptoms. If not, you can stay at two puffs twice a day Come to your next appt fasting to check your cholesterol Get your mammogram before next visit   IF you received an x-ray today, you will receive an invoice from [facility]. Please contact [facility] at [phone] with questions or concerns regarding your invoice.   IF you received labwork today, you will receive an invoice from [employer]. Please contact [facility] at [phone] with questions or concerns regarding your invoice.   Our billing staff will not be able to assist you with questions regarding bills from these companies.  You will be contacted with the lab results as soon as they are available. The fastest way to get your results is to activate your My Chart account. Instructions are located on the last page of this paperwork. If you have not heard from [place] regarding the results in 2 weeks, please contact this office.

## 2016-04-26 NOTE — Progress Notes (Signed)
[facility] [address] [place] [address] [phone]  Date:  04/26/2016   Name:  [name] [name]   DOB:  07-05-1956   MRN:  [id number]  PCP:  Pcp Not In System    Chief Complaint: Medication Refill   History of Present Illness:  This is a 60 y.o. female with PMH SA node dysfunction with pacemaker in place, OSA, DM2, migraine, DM2, GERD, NAFLD, HTN, HLD who is presenting for med refills.  Asthma: flovent twice a day and having to use albuterol once a day. States her asthma is worse in the summer time. The heat and humidity bother her. During the fall/winter she has to use her albuterol 2-3 times a week. Env allergies: zyrtec and flonase daily Hives: triamcinolone cream and atarax prn. Anxiety: taking celexa 20 mg. Helps a lot. Has been on this for 4 years. IBS, diarrhea predominant: getting bentyl from GI for pain. Helping her a lot.She had colonoscopy a few months back, found adenomatous polyps, needing to return for repeat in 3 years. She is also followed by GI for NAFLD. DM2: taking glimiperide 2 mg BID. She was taken off metformin 1.5 years ago d/t elevated LFTs. FBG ranges from 120-175. Takes neurontin for peripheral neuropathy. Had eye exam 12/2015. Last A1C 6.7. She is noting recently that she gets shaky, nauseated and sweaty around 10:30 AM. She will check her BG and usually around 150-170. She gets up around 5:30 AM. Does not eat a big breakfast, usually just cheese and crackers or a boiled egg. She does not eat again until noon. She takes her amaryl with lunch and dinner. States she eats healthy -- mostly meat and vegetables. She has cut out carbs. She occ has a sweet tea. She does not exercise but does have a very physically active job. GERD: takes prilosec daily. Cannot tolerate not taking even for a day. Migraine: gets migraine and cyclic vomiting. Takes zofran and that helps a lot. Neck pain: takes tramadol as needed. 30 tablets last 90 days. BP:  losartan, metoprolol, zestoretic. This is a plan set out by her cardiologist but we fill these meds. HLD: last had lipid panel in 2015. Total 270s, LDL 178. She cannot tolerate statins. States she has flu-like symptoms with statins. She takes fish oil instead. She is not fasting today.   Had a hysterectomy 17 years ago for fibroids. No dysplasia. Mammogram: due for mammogram 08/2016. pnuemovax 2009 Never got shingles vaccine, she wants to get but not at this time. tdap UTD.  Review of Systems:  Review of Systems See HPI  Patient Active Problem List   Diagnosis Date Noted  . Pacemaker Medtronic   . Ventricular tachycardia, nonsustained-during exercise testing 03/07/2013  . OSA (obstructive sleep apnea) 05/25/2011  . Hx of adenomatous colonic polyps 05/25/2011  . Migraine 05/25/2011  . DM2 (diabetes mellitus, type 2) ([place]) 05/25/2011  . GERD (gastroesophageal reflux disease) 05/25/2011  . NAFLD (nonalcoholic fatty liver disease) 05/25/2011  . Hyperlipidemia 05/25/2011  . Severe essential hypertension 09/16/2009  . DIASTOLIC HEART FAILURE, CHRONIC 09/16/2009  . DYSPNEA 09/16/2009    Prior to Admission medications   Medication Sig Start Date End Date Taking? Authorizing Provider  albuterol (PROAIR HFA) 108 (90 BASE) MCG/ACT inhaler Inhale 2 puffs into the lungs as needed. 05/01/14  Yes Barton Fanny, MD  cetirizine (ZYRTEC) 10 MG tablet Take 10 mg by mouth daily.     Yes Historical Provider, MD  Cholecalciferol (VITAMIN D) 2000  UNITS CAPS Take by mouth.     Yes Historical Provider, MD  citalopram (CELEXA) 20 MG tablet TAKE 1 BY MOUTH DAILY 07/14/15  Yes Tishira R Brewington, PA-C  dicyclomine (BENTYL) 20 MG tablet Take 20 mg by mouth 3 (three) times daily.   Yes Historical Provider, MD  fish oil-omega-3 fatty acids 1000 MG capsule Take 2 g by mouth daily. PER PATIENT TAKE 8000 MG DAILY   Yes Historical Provider, MD  fluticasone (FLONASE) 50 MCG/ACT nasal spray Place 2 sprays  into both nostrils daily. 03/06/15  Yes Barton Fanny, MD  fluticasone (FLOVENT HFA) 44 MCG/ACT inhaler Inhale 2 puffs into the lungs 2 (two) times daily. 03/12/15  Yes Todd McVeigh, PA  gabapentin (NEURONTIN) 300 MG capsule TAKE 1 BY MOUTH IN THE MORNING AND 2 AT BEDTIME 07/14/15  Yes Tishira R Brewington, PA-C  glimepiride (AMARYL) 4 MG tablet Take 1/2 tablet twice a day before main meals. 03/06/15  Yes Barton Fanny, MD  glucose blood test strip Use as instructed 03/06/15  Yes Barton Fanny, MD  hydrOXYzine (ATARAX/VISTARIL) 25 MG tablet Take 1 tablet (25 mg total) by mouth every 8 (eight) hours as needed. 03/06/15  Yes Barton Fanny, MD  lisinopril-hydrochlorothiazide (PRINZIDE,ZESTORETIC) 20-12.5 MG tablet Take 1 tablet by mouth 2 (two) times daily. 07/14/15  Yes Tishira R Brewington, PA-C  losartan (COZAAR) 100 MG tablet Take 1 tablet (100 mg total) by mouth daily. 07/14/15  Yes Tishira R Brewington, PA-C  metoprolol succinate (TOPROL-XL) 50 MG 24 hr tablet Take 2 tablets (100 mg total) by mouth daily. Take with or immediately following a meal. 07/14/15  Yes Tishira R Brewington, PA-C  Multiple Vitamin (MULTIVITAMIN) tablet Take 1 tablet by mouth daily.     Yes Historical Provider, MD  omeprazole (PRILOSEC) 20 MG capsule TAKE 1 BY MOUTH DAILY 07/14/15  Yes Tishira R Brewington, PA-C  ondansetron (ZOFRAN-ODT) 8 MG disintegrating tablet DISSOLVE 1 ON TONGUE EVERY 12 HOURS AS NEEDED FOR NAUSEA 03/06/15  Yes Barton Fanny, MD  traMADol (ULTRAM) 50 MG tablet Take 1 tablet (50 mg total) by mouth every 8 (eight) hours as needed. 03/06/15  Yes Barton Fanny, MD  triamcinolone cream (KENALOG) 0.1 % Apply 1 application topically as needed. 03/06/15  Yes Barton Fanny, MD  zoster vaccine live, PF, (ZOSTAVAX) [address] UNT/0.65ML injection Inject 19,400 Units into the skin once. Patient not taking: Reported on 04/26/2016 05/01/14   Barton Fanny, MD    Allergies  Allergen  Reactions  . Adhesive [Tape] Other (See Comments)    SKIN BLISTERS  . Penicillins   . Red Yeast Rice Hives  . Sulfa Drugs Cross Reactors Hives    Past Surgical History  Procedure Laterality Date  . Pacemaker placement    . Oophorectomy    . Tonsillectomy    . Eye surgery Right 2010  . Abdominal hysterectomy  2000    total  . T/a right knee      Social History  Substance Use Topics  . Smoking status: Former Smoker    Types: Cigarettes    Quit date: 10/12/1991  . Smokeless tobacco: Never Used     Comment: Quit 20 yrs ago  . Alcohol Use: No    Family History  Problem Relation Age of Onset  . Heart disease Mother   . Cancer Mother 57    lung  . Breast cancer Mother     maternal grandmother  . Diabetes Sister  type 2  . Colon cancer Neg Hx   . Breast cancer Brother   . Cancer Maternal Grandmother     breast  . Leukemia Paternal Grandmother     neoplast  . Aneurysm Paternal Grandfather     abdominal (stomach)  . Heart disease Father     Medication list has been reviewed and updated.  Physical Examination:  Physical Exam  Constitutional: She is oriented to person, place, and time. She appears well-developed and well-nourished. No distress.  HENT:  Head: Normocephalic and atraumatic.  Right Ear: Hearing normal.  Left Ear: Hearing normal.  Nose: Nose normal.  Mouth/Throat: Uvula is midline, oropharynx is clear and moist and mucous membranes are normal.  Eyes: Conjunctivae, EOM and lids are normal. Pupils are equal, round, and reactive to light. Right eye exhibits no discharge. Left eye exhibits no discharge. No scleral icterus.  Neck: Carotid bruit is not present.  Cardiovascular: Normal rate, regular rhythm, normal heart sounds and normal pulses.   No murmur heard. Pulmonary/Chest: Effort normal and breath sounds normal. No respiratory distress. She has no wheezes. She has no rhonchi. She has no rales.  Pacemaker palpated upper left chest   Musculoskeletal: Normal range of motion.  Lymphadenopathy:       Head (right side): No submental, no submandibular and no tonsillar adenopathy present.       Head (left side): No submental, no submandibular and no tonsillar adenopathy present.    She has no cervical adenopathy.  Neurological: She is alert and oriented to person, place, and time. No sensory deficit. Gait normal.  Skin: Skin is warm, dry and intact. No lesion and no rash noted.  Diabetic Foot Exam - Simple   Visual Inspection  No deformities, no ulcerations, no other skin breakdown bilaterally:  Yes  Sensation Testing  Intact to touch and monofilament testing bilaterally:  Yes  Pulse Check  Posterior Tibialis and Dorsalis pulse intact bilaterally:  Yes      Psychiatric: She has a normal mood and affect. Her speech is normal and behavior is normal. Thought content normal.    BP 116/78 mmHg  Pulse 101  Temp(Src) 98.1 F (36.7 C) (Oral)  Resp 18  Ht 5\' 2"  (1.575 m)  Wt 237 lb 3.2 oz (107.593 kg)  BMI 43.37 kg/m2  SpO2 96%  Results for orders placed or performed in visit on 04/26/16  POCT glycosylated hemoglobin (Hb A1C)  Result Value Ref Range   Hemoglobin A1C 7.4   POCT glucose (manual entry)  Result Value Ref Range   POC Glucose 140 (A) 70 - 99 mg/dl    Assessment and Plan:  1. Type 2 diabetes mellitus with diabetic polyneuropathy, without long-term current use of insulin ([place]) 2. Encounter for diabetic foot exam A1C increased from 1 year ago from 6.7 to 7.4. No medication changes today. She is going to work on better meal preparation and eating more regularly throughout the day. Encouraged to exercise outside of work. Return in 6 months for f/u. - CBC - Comprehensive metabolic panel - POCT glycosylated hemoglobin (Hb A1C) - POCT glucose (manual entry) - gabapentin (NEURONTIN) 300 MG capsule; TAKE 1 BY MOUTH IN THE MORNING AND 2 AT BEDTIME  Dispense: 270 capsule; Refill: 2 - glimepiride (AMARYL) 4 MG  tablet; Take 1/2 tablet twice a day before main meals.  Dispense: 90 tablet; Refill: 1   3. Sinoatrial node dysfunction (HCC) - metoprolol succinate (TOPROL-XL) 50 MG 24 hr tablet; Take 2 tablets (100 mg total)  by mouth daily. Take with or immediately following a meal.  Dispense: 180 tablet; Refill: 2  4. Asthmatic bronchitis, moderate persistent, uncomplicated 5. Dyspnea  Asthma not controlled at this time. Increase flovent dose. - fluticasone (FLOVENT HFA) 110 MCG/ACT inhaler; Inhale 2 puffs into the lungs 2 (two) times daily.  Dispense: 1 Inhaler; Refill: 12 - albuterol (PROAIR HFA) 108 (90 Base) MCG/ACT inhaler; Inhale 2 puffs into the lungs as needed.  Dispense: 2 Inhaler; Refill: 3  6. Allergic urticaria - hydrOXYzine (ATARAX/VISTARIL) 25 MG tablet; Take 1 tablet (25 mg total) by mouth every 8 (eight) hours as needed.  Dispense: 30 tablet; Refill: 1 - triamcinolone cream (KENALOG) 0.1 %; Apply 1 application topically as needed.  Dispense: 30 g; Refill: 2  7. Severe essential hypertension - lisinopril-hydrochlorothiazide (PRINZIDE,ZESTORETIC) 20-12.5 MG tablet; Take 1 tablet by mouth 2 (two) times daily.  Dispense: 180 tablet; Refill: 2 - losartan (COZAAR) 100 MG tablet; Take 1 tablet (100 mg total) by mouth daily.  Dispense: 90 tablet; Refill: 2 - metoprolol succinate (TOPROL-XL) 50 MG 24 hr tablet; Take 2 tablets (100 mg total) by mouth daily. Take with or immediately following a meal.  Dispense: 180 tablet; Refill: 2  8. NAFLD (nonalcoholic fatty liver disease) Stable. Followed by GI.  9. Gastroesophageal reflux disease without esophagitis - omeprazole (PRILOSEC) 20 MG capsule; TAKE 1 BY MOUTH DAILY  Dispense: 90 capsule; Refill: 2  10. Hx of adenomatous colonic polyps Needs repeat colonoscopy in 3 years.  11. Migraine with aura and without status migrainosus, not intractable - ondansetron (ZOFRAN-ODT) 8 MG disintegrating tablet; DISSOLVE 1 ON TONGUE EVERY 12 HOURS AS NEEDED  FOR NAUSEA  Dispense: 30 tablet; Refill: 3  12. OSA (obstructive sleep apnea) Wears CPAP nightly.  13. Hyperlipidemia Cannot tolerate statins. Takes fish oil instead. She is not fasting today. She will return in 6 months for fasting blood work.  14. Environmental allergies - fluticasone (FLONASE) 50 MCG/ACT nasal spray; Place 2 sprays into both nostrils daily.  Dispense: 48 g; Refill: 3  15. Neck pain - traMADol (ULTRAM) 50 MG tablet; Take 1 tablet (50 mg total) by mouth every 8 (eight) hours as needed.  Dispense: 30 tablet; Refill: 2  16. Anxiety - citalopram (CELEXA) 20 MG tablet; TAKE 1 BY MOUTH DAILY  Dispense: 90 tablet; Refill: 2   Benjaman Pott. Drenda Freeze, MHS [facility]  04/26/2016

## 2016-04-27 LAB — COMPREHENSIVE METABOLIC PANEL
ALK PHOS: 72 U/L (ref 33–130)
ALT: 18 U/L (ref 6–29)
AST: 21 U/L (ref 10–35)
Albumin: 4.6 g/dL (ref 3.6–5.1)
BUN: 22 mg/dL (ref 7–25)
CHLORIDE: 99 mmol/L (ref 98–110)
CO2: 25 mmol/L (ref 20–31)
Calcium: 10.1 mg/dL (ref 8.6–10.4)
Creat: 0.92 mg/dL (ref 0.50–0.99)
GLUCOSE: 157 mg/dL — AB (ref 65–99)
POTASSIUM: 4.8 mmol/L (ref 3.5–5.3)
Sodium: 141 mmol/L (ref 135–146)
TOTAL PROTEIN: 7.6 g/dL (ref 6.1–8.1)
Total Bilirubin: 0.5 mg/dL (ref 0.2–1.2)

## 2016-04-27 LAB — CBC
HCT: 41.2 % (ref 35.0–45.0)
Hemoglobin: 14.2 g/dL (ref 11.7–15.5)
MCH: 30.3 pg (ref 27.0–33.0)
MCHC: 34.5 g/dL (ref 32.0–36.0)
MCV: 87.8 fL (ref 80.0–100.0)
MPV: 9.6 fL (ref 7.5–12.5)
PLATELETS: 202 10*3/uL (ref 140–400)
RBC: 4.69 MIL/uL (ref 3.80–5.10)
RDW: 13.8 % (ref 11.0–15.0)
WBC: 5.8 10*3/uL (ref 3.8–10.8)

## 2016-04-27 NOTE — Progress Notes (Signed)
Remote pacemaker transmission.   

## 2016-04-28 ENCOUNTER — Encounter: Payer: Self-pay | Admitting: Cardiology

## 2016-05-12 ENCOUNTER — Encounter: Payer: Self-pay | Admitting: Cardiology

## 2016-07-20 ENCOUNTER — Inpatient Hospital Stay (HOSPITAL_COMMUNITY)
Admission: EM | Admit: 2016-07-20 | Discharge: 2016-07-27 | DRG: 234 | Disposition: A | Discharge status: Home / Self Care | Payer: BLUE CROSS/BLUE SHIELD | Attending: Cardiothoracic Surgery | Admitting: Cardiothoracic Surgery

## 2016-07-20 ENCOUNTER — Ambulatory Visit (INDEPENDENT_AMBULATORY_CARE_PROVIDER_SITE_OTHER): Payer: BLUE CROSS/BLUE SHIELD

## 2016-07-20 ENCOUNTER — Other Ambulatory Visit: Payer: Self-pay

## 2016-07-20 ENCOUNTER — Encounter (HOSPITAL_COMMUNITY): Payer: Self-pay | Admitting: Emergency Medicine

## 2016-07-20 ENCOUNTER — Ambulatory Visit (INDEPENDENT_AMBULATORY_CARE_PROVIDER_SITE_OTHER): Payer: BLUE CROSS/BLUE SHIELD | Admitting: Physician Assistant

## 2016-07-20 ENCOUNTER — Other Ambulatory Visit (HOSPITAL_COMMUNITY): Payer: Self-pay

## 2016-07-20 ENCOUNTER — Encounter (HOSPITAL_COMMUNITY)
Admission: EM | Disposition: A | Discharge status: Home / Self Care | Payer: Self-pay | Source: Home / Self Care | Attending: Cardiothoracic Surgery

## 2016-07-20 VITALS — BP 128/86 | HR 77 | Temp 98.3°F | Resp 17 | Ht 62.0 in | Wt 239.0 lb

## 2016-07-20 DIAGNOSIS — I4891 Unspecified atrial fibrillation: Secondary | ICD-10-CM | POA: Diagnosis present

## 2016-07-20 DIAGNOSIS — IMO0002 Reserved for concepts with insufficient information to code with codable children: Secondary | ICD-10-CM | POA: Diagnosis present

## 2016-07-20 DIAGNOSIS — E118 Type 2 diabetes mellitus with unspecified complications: Secondary | ICD-10-CM

## 2016-07-20 DIAGNOSIS — Z950 Presence of cardiac pacemaker: Secondary | ICD-10-CM | POA: Diagnosis not present

## 2016-07-20 DIAGNOSIS — R0602 Shortness of breath: Secondary | ICD-10-CM | POA: Diagnosis not present

## 2016-07-20 DIAGNOSIS — D62 Acute posthemorrhagic anemia: Secondary | ICD-10-CM | POA: Diagnosis not present

## 2016-07-20 DIAGNOSIS — I495 Sick sinus syndrome: Secondary | ICD-10-CM

## 2016-07-20 DIAGNOSIS — I25110 Atherosclerotic heart disease of native coronary artery with unstable angina pectoris: Secondary | ICD-10-CM | POA: Diagnosis present

## 2016-07-20 DIAGNOSIS — Z6841 Body Mass Index (BMI) 40.0 and over, adult: Secondary | ICD-10-CM

## 2016-07-20 DIAGNOSIS — Z45018 Encounter for adjustment and management of other part of cardiac pacemaker: Secondary | ICD-10-CM

## 2016-07-20 DIAGNOSIS — E1121 Type 2 diabetes mellitus with diabetic nephropathy: Secondary | ICD-10-CM | POA: Diagnosis not present

## 2016-07-20 DIAGNOSIS — G4733 Obstructive sleep apnea (adult) (pediatric): Secondary | ICD-10-CM | POA: Diagnosis present

## 2016-07-20 DIAGNOSIS — E785 Hyperlipidemia, unspecified: Secondary | ICD-10-CM | POA: Diagnosis present

## 2016-07-20 DIAGNOSIS — I2102 ST elevation (STEMI) myocardial infarction involving left anterior descending coronary artery: Principal | ICD-10-CM | POA: Diagnosis present

## 2016-07-20 DIAGNOSIS — I10 Essential (primary) hypertension: Secondary | ICD-10-CM | POA: Diagnosis present

## 2016-07-20 DIAGNOSIS — Z79899 Other long term (current) drug therapy: Secondary | ICD-10-CM

## 2016-07-20 DIAGNOSIS — Z7984 Long term (current) use of oral hypoglycemic drugs: Secondary | ICD-10-CM | POA: Diagnosis not present

## 2016-07-20 DIAGNOSIS — Z96653 Presence of artificial knee joint, bilateral: Secondary | ICD-10-CM | POA: Diagnosis present

## 2016-07-20 DIAGNOSIS — I2121 ST elevation (STEMI) myocardial infarction involving left circumflex coronary artery: Secondary | ICD-10-CM | POA: Diagnosis not present

## 2016-07-20 DIAGNOSIS — I110 Hypertensive heart disease with heart failure: Secondary | ICD-10-CM | POA: Diagnosis present

## 2016-07-20 DIAGNOSIS — F329 Major depressive disorder, single episode, unspecified: Secondary | ICD-10-CM | POA: Diagnosis present

## 2016-07-20 DIAGNOSIS — J45909 Unspecified asthma, uncomplicated: Secondary | ICD-10-CM | POA: Diagnosis present

## 2016-07-20 DIAGNOSIS — Z87891 Personal history of nicotine dependence: Secondary | ICD-10-CM

## 2016-07-20 DIAGNOSIS — E782 Mixed hyperlipidemia: Secondary | ICD-10-CM | POA: Diagnosis not present

## 2016-07-20 DIAGNOSIS — I259 Chronic ischemic heart disease, unspecified: Secondary | ICD-10-CM

## 2016-07-20 DIAGNOSIS — I2510 Atherosclerotic heart disease of native coronary artery without angina pectoris: Secondary | ICD-10-CM | POA: Diagnosis not present

## 2016-07-20 DIAGNOSIS — Z951 Presence of aortocoronary bypass graft: Secondary | ICD-10-CM

## 2016-07-20 DIAGNOSIS — E1165 Type 2 diabetes mellitus with hyperglycemia: Secondary | ICD-10-CM | POA: Diagnosis present

## 2016-07-20 DIAGNOSIS — K219 Gastro-esophageal reflux disease without esophagitis: Secondary | ICD-10-CM | POA: Diagnosis present

## 2016-07-20 DIAGNOSIS — F419 Anxiety disorder, unspecified: Secondary | ICD-10-CM | POA: Diagnosis present

## 2016-07-20 DIAGNOSIS — I5032 Chronic diastolic (congestive) heart failure: Secondary | ICD-10-CM | POA: Diagnosis present

## 2016-07-20 DIAGNOSIS — I252 Old myocardial infarction: Secondary | ICD-10-CM | POA: Diagnosis not present

## 2016-07-20 DIAGNOSIS — E669 Obesity, unspecified: Secondary | ICD-10-CM | POA: Diagnosis present

## 2016-07-20 DIAGNOSIS — R079 Chest pain, unspecified: Secondary | ICD-10-CM | POA: Diagnosis present

## 2016-07-20 DIAGNOSIS — Z01810 Encounter for preprocedural cardiovascular examination: Secondary | ICD-10-CM | POA: Diagnosis not present

## 2016-07-20 HISTORY — PX: CARDIAC CATHETERIZATION: SHX172

## 2016-07-20 LAB — I-STAT CG4 LACTIC ACID, ED: LACTIC ACID, VENOUS: 3.41 mmol/L — AB (ref 0.5–1.9)

## 2016-07-20 LAB — GLUCOSE, CAPILLARY: Glucose-Capillary: 91 mg/dL (ref 65–99)

## 2016-07-20 LAB — COMPLETE METABOLIC PANEL WITH GFR
ALT: 20 U/L (ref 6–29)
AST: 23 U/L (ref 10–35)
Albumin: 4.6 g/dL (ref 3.6–5.1)
Alkaline Phosphatase: 68 U/L (ref 33–130)
BILIRUBIN TOTAL: 0.5 mg/dL (ref 0.2–1.2)
BUN: 20 mg/dL (ref 7–25)
CO2: 29 mmol/L (ref 20–31)
CREATININE: 0.92 mg/dL (ref 0.50–0.99)
Calcium: 10.3 mg/dL (ref 8.6–10.4)
Chloride: 102 mmol/L (ref 98–110)
GFR, Est African American: 77 mL/min (ref >=60)
GFR, Est Non African American: 67 mL/min (ref >=60)
Glucose, Bld: 162 mg/dL — ABNORMAL HIGH (ref 65–99)
Potassium: 4.5 mmol/L (ref 3.5–5.3)
SODIUM: 140 mmol/L (ref 135–146)
TOTAL PROTEIN: 7.3 g/dL (ref 6.1–8.1)

## 2016-07-20 LAB — CBC WITH DIFFERENTIAL/PLATELET
Basophils Absolute: 0 10*3/uL (ref 0.0–0.1)
Basophils Relative: 0 %
EOS ABS: 0.1 10*3/uL (ref 0.0–0.7)
EOS PCT: 1 %
HCT: 37.5 % (ref 36.0–46.0)
HEMOGLOBIN: 13 g/dL (ref 12.0–15.0)
LYMPHS ABS: 1.3 10*3/uL (ref 0.7–4.0)
LYMPHS PCT: 18 %
MCH: 30.6 pg (ref 26.0–34.0)
MCHC: 34.7 g/dL (ref 30.0–36.0)
MCV: 88.2 fL (ref 78.0–100.0)
MONOS PCT: 6 %
Monocytes Absolute: 0.5 10*3/uL (ref 0.1–1.0)
Neutro Abs: 5.6 10*3/uL (ref 1.7–7.7)
Neutrophils Relative %: 75 %
PLATELETS: 205 10*3/uL (ref 150–400)
RBC: 4.25 MIL/uL (ref 3.87–5.11)
RDW: 13 % (ref 11.5–15.5)
WBC: 7.5 10*3/uL (ref 4.0–10.5)

## 2016-07-20 LAB — CBC
HCT: 40.6 % (ref 36.0–46.0)
Hemoglobin: 14.2 g/dL (ref 12.0–15.0)
MCH: 31.1 pg (ref 26.0–34.0)
MCHC: 35 g/dL (ref 30.0–36.0)
MCV: 88.8 fL (ref 78.0–100.0)
PLATELETS: 212 10*3/uL (ref 150–400)
RBC: 4.57 MIL/uL (ref 3.87–5.11)
RDW: 13.2 % (ref 11.5–15.5)
WBC: 7.7 10*3/uL (ref 4.0–10.5)

## 2016-07-20 LAB — POCT CBC
Granulocyte percent: 71.2 %G (ref 37–80)
HEMATOCRIT: 39.7 % (ref 37.7–47.9)
HEMOGLOBIN: 14 g/dL (ref 12.2–16.2)
LYMPH, POC: 1.4 (ref 0.6–3.4)
MCH: 31.4 pg — AB (ref 27–31.2)
MCHC: 35.2 g/dL (ref 31.8–35.4)
MCV: 89.5 fL (ref 80–97)
MID (cbc): 0.5 (ref 0–0.9)
MPV: 6.9 fL (ref 0–99.8)
POC GRANULOCYTE: 4.7 (ref 2–6.9)
POC LYMPH PERCENT: 21.9 %L (ref 10–50)
POC MID %: 6.9 %M (ref 0–12)
Platelet Count, POC: 239 10*3/uL (ref 142–424)
RBC: 4.44 M/uL (ref 4.04–5.48)
RDW, POC: 13.3 %
WBC: 6.6 10*3/uL (ref 4.6–10.2)

## 2016-07-20 LAB — I-STAT TROPONIN, ED: TROPONIN I, POC: 0.15 ng/mL — AB (ref 0.00–0.08)

## 2016-07-20 LAB — URINALYSIS, ROUTINE W REFLEX MICROSCOPIC
Bilirubin Urine: NEGATIVE
GLUCOSE, UA: NEGATIVE mg/dL
Hgb urine dipstick: NEGATIVE
KETONES UR: NEGATIVE mg/dL
LEUKOCYTES UA: NEGATIVE
Nitrite: NEGATIVE
PH: 5.5 (ref 5.0–8.0)
Protein, ur: NEGATIVE mg/dL
SPECIFIC GRAVITY, URINE: 1.042 — AB (ref 1.005–1.030)

## 2016-07-20 LAB — BASIC METABOLIC PANEL
Anion gap: 11 (ref 5–15)
BUN: 18 mg/dL (ref 6–20)
CALCIUM: 10.5 mg/dL — AB (ref 8.9–10.3)
CO2: 25 mmol/L (ref 22–32)
CREATININE: 0.96 mg/dL (ref 0.44–1.00)
Chloride: 101 mmol/L (ref 101–111)
GFR calc Af Amer: >60 mL/min (ref >60)
GFR calc non Af Amer: >60 mL/min (ref >60)
GLUCOSE: 146 mg/dL — AB (ref 65–99)
Potassium: 4.5 mmol/L (ref 3.5–5.1)
Sodium: 137 mmol/L (ref 135–145)

## 2016-07-20 LAB — POCT I-STAT TROPONIN I: TROPONIN I, POC: 0.29 ng/mL — AB (ref 0.00–0.08)

## 2016-07-20 LAB — SURGICAL PCR SCREEN
MRSA, PCR: NEGATIVE
Staphylococcus aureus: NEGATIVE

## 2016-07-20 LAB — TROPONIN I: Troponin I: 1.18 ng/mL (ref <0.03)

## 2016-07-20 LAB — BRAIN NATRIURETIC PEPTIDE: B Natriuretic Peptide: 88.8 pg/mL (ref 0.0–100.0)

## 2016-07-20 SURGERY — LEFT HEART CATH AND CORONARY ANGIOGRAPHY

## 2016-07-20 MED ORDER — ASPIRIN 81 MG PO CHEW
324.0000 mg | CHEWABLE_TABLET | Freq: Once | ORAL | Status: AC
  Administered 2016-07-20: 324 mg via ORAL
  Filled 2016-07-20: qty 4

## 2016-07-20 MED ORDER — SODIUM CHLORIDE 0.9% FLUSH
3.0000 mL | Freq: Two times a day (BID) | INTRAVENOUS | Status: DC
  Administered 2016-07-21 – 2016-07-22 (×3): 3 mL via INTRAVENOUS

## 2016-07-20 MED ORDER — MORPHINE SULFATE (PF) 2 MG/ML IV SOLN
2.0000 mg | INTRAVENOUS | Status: DC | PRN
  Administered 2016-07-21 (×2): 2 mg via INTRAVENOUS
  Filled 2016-07-20 (×2): qty 1

## 2016-07-20 MED ORDER — NITROGLYCERIN IN D5W 200-5 MCG/ML-% IV SOLN
INTRAVENOUS | Status: AC
  Filled 2016-07-20: qty 250

## 2016-07-20 MED ORDER — NITROGLYCERIN 0.4 MG SL SUBL
0.4000 mg | SUBLINGUAL_TABLET | SUBLINGUAL | Status: DC | PRN
  Administered 2016-07-20 – 2016-07-21 (×2): 0.4 mg via SUBLINGUAL
  Filled 2016-07-20 (×2): qty 1

## 2016-07-20 MED ORDER — FLUTICASONE PROPIONATE 50 MCG/ACT NA SUSP
2.0000 | Freq: Every day | NASAL | Status: DC
  Administered 2016-07-20 – 2016-07-21 (×2): 2 via NASAL
  Filled 2016-07-20: qty 16

## 2016-07-20 MED ORDER — ASPIRIN EC 325 MG PO TBEC
325.0000 mg | DELAYED_RELEASE_TABLET | Freq: Every day | ORAL | Status: DC
  Administered 2016-07-21 – 2016-07-22 (×2): 325 mg via ORAL
  Filled 2016-07-20 (×2): qty 1

## 2016-07-20 MED ORDER — GLIMEPIRIDE 2 MG PO TABS
2.0000 mg | ORAL_TABLET | Freq: Every day | ORAL | Status: DC
  Administered 2016-07-21: 2 mg via ORAL
  Filled 2016-07-20 (×3): qty 1

## 2016-07-20 MED ORDER — SODIUM CHLORIDE 0.9 % WEIGHT BASED INFUSION
1.0000 mL/kg/h | INTRAVENOUS | Status: AC
  Administered 2016-07-20: 1 mL/kg/h via INTRAVENOUS

## 2016-07-20 MED ORDER — CITALOPRAM HYDROBROMIDE 20 MG PO TABS
20.0000 mg | ORAL_TABLET | Freq: Every day | ORAL | Status: DC
  Administered 2016-07-21: 20 mg via ORAL
  Filled 2016-07-20: qty 1

## 2016-07-20 MED ORDER — VERAPAMIL HCL 2.5 MG/ML IV SOLN
INTRAVENOUS | Status: DC | PRN
  Administered 2016-07-20: 10 mL via INTRA_ARTERIAL

## 2016-07-20 MED ORDER — NITROGLYCERIN IN D5W 200-5 MCG/ML-% IV SOLN
0.0000 ug/min | INTRAVENOUS | Status: DC
  Administered 2016-07-20: 10 ug/min via INTRAVENOUS

## 2016-07-20 MED ORDER — IOPAMIDOL (ISOVUE-370) INJECTION 76%
INTRAVENOUS | Status: DC | PRN
  Administered 2016-07-20: 120 mL via INTRAVENOUS

## 2016-07-20 MED ORDER — NITROGLYCERIN 1 MG/10 ML FOR IR/CATH LAB
INTRA_ARTERIAL | Status: AC
  Filled 2016-07-20: qty 10

## 2016-07-20 MED ORDER — PREDNISONE 20 MG PO TABS: ORAL_TABLET | ORAL | 0 refills | Status: DC

## 2016-07-20 MED ORDER — ONDANSETRON HCL 4 MG/2ML IJ SOLN
4.0000 mg | Freq: Four times a day (QID) | INTRAMUSCULAR | Status: DC | PRN
  Administered 2016-07-21: 4 mg via INTRAVENOUS
  Filled 2016-07-20: qty 2

## 2016-07-20 MED ORDER — HEPARIN (PORCINE) IN NACL 100-0.45 UNIT/ML-% IJ SOLN
1600.0000 [IU]/h | INTRAMUSCULAR | Status: DC
  Administered 2016-07-21: 1600 [IU]/h via INTRAVENOUS
  Administered 2016-07-21: 1050 [IU]/h via INTRAVENOUS
  Filled 2016-07-20 (×2): qty 250

## 2016-07-20 MED ORDER — HEPARIN (PORCINE) IN NACL 2-0.9 UNIT/ML-% IJ SOLN
INTRAMUSCULAR | Status: DC | PRN
  Administered 2016-07-20: 500 mL
  Administered 2016-07-20: 1000 mL

## 2016-07-20 MED ORDER — GABAPENTIN 300 MG PO CAPS
300.0000 mg | ORAL_CAPSULE | Freq: Every morning | ORAL | Status: DC
  Administered 2016-07-21: 300 mg via ORAL
  Filled 2016-07-20: qty 1

## 2016-07-20 MED ORDER — SODIUM CHLORIDE 0.9 % IV SOLN: 250.0000 mL | INTRAVENOUS | Status: DC | PRN

## 2016-07-20 MED ORDER — ACETAMINOPHEN 325 MG PO TABS
650.0000 mg | ORAL_TABLET | ORAL | Status: DC | PRN
  Administered 2016-07-20 – 2016-07-21 (×4): 650 mg via ORAL
  Filled 2016-07-20 (×4): qty 2

## 2016-07-20 MED ORDER — HEPARIN (PORCINE) IN NACL 2-0.9 UNIT/ML-% IJ SOLN
INTRAMUSCULAR | Status: AC
  Filled 2016-07-20: qty 1500

## 2016-07-20 MED ORDER — HEPARIN SODIUM (PORCINE) 1000 UNIT/ML IJ SOLN
INTRAMUSCULAR | Status: AC
  Filled 2016-07-20: qty 1

## 2016-07-20 MED ORDER — SODIUM CHLORIDE 0.9% FLUSH: 3.0000 mL | INTRAVENOUS | Status: DC | PRN

## 2016-07-20 MED ORDER — GABAPENTIN 300 MG PO CAPS
600.0000 mg | ORAL_CAPSULE | Freq: Every day | ORAL | Status: DC
  Administered 2016-07-20 – 2016-07-21 (×2): 600 mg via ORAL
  Filled 2016-07-20 (×2): qty 2

## 2016-07-20 MED ORDER — VERAPAMIL HCL 2.5 MG/ML IV SOLN
INTRAVENOUS | Status: AC
  Filled 2016-07-20: qty 2

## 2016-07-20 MED ORDER — PANTOPRAZOLE SODIUM 40 MG PO TBEC
40.0000 mg | DELAYED_RELEASE_TABLET | Freq: Every day | ORAL | Status: DC
  Administered 2016-07-20 – 2016-07-21 (×2): 40 mg via ORAL
  Filled 2016-07-20 (×2): qty 1

## 2016-07-20 MED ORDER — DICYCLOMINE HCL 20 MG PO TABS
20.0000 mg | ORAL_TABLET | Freq: Three times a day (TID) | ORAL | Status: DC
  Administered 2016-07-20 – 2016-07-21 (×5): 20 mg via ORAL
  Filled 2016-07-20 (×9): qty 1

## 2016-07-20 MED ORDER — HEPARIN SODIUM (PORCINE) 5000 UNIT/ML IJ SOLN
INTRAMUSCULAR | Status: AC
  Administered 2016-07-20: 4000 [IU]
  Filled 2016-07-20: qty 1

## 2016-07-20 MED ORDER — INSULIN ASPART 100 UNIT/ML ~~LOC~~ SOLN
0.0000 [IU] | Freq: Three times a day (TID) | SUBCUTANEOUS | Status: DC
  Administered 2016-07-21: 11 [IU] via SUBCUTANEOUS
  Administered 2016-07-21 – 2016-07-22 (×3): 4 [IU] via SUBCUTANEOUS

## 2016-07-20 MED ORDER — METOPROLOL SUCCINATE ER 100 MG PO TB24
100.0000 mg | ORAL_TABLET | Freq: Every day | ORAL | Status: DC
  Administered 2016-07-21: 100 mg via ORAL
  Filled 2016-07-20: qty 1

## 2016-07-20 MED ORDER — ATORVASTATIN CALCIUM 80 MG PO TABS
80.0000 mg | ORAL_TABLET | Freq: Every day | ORAL | Status: DC
  Administered 2016-07-21: 80 mg via ORAL
  Filled 2016-07-20 (×2): qty 1

## 2016-07-20 MED ORDER — LORATADINE 10 MG PO TABS
10.0000 mg | ORAL_TABLET | Freq: Every day | ORAL | Status: DC
  Administered 2016-07-21: 10 mg via ORAL
  Filled 2016-07-20: qty 1

## 2016-07-20 MED ORDER — LIDOCAINE HCL (PF) 1 % IJ SOLN
INTRAMUSCULAR | Status: DC | PRN
  Administered 2016-07-20: 3 mL

## 2016-07-20 MED ORDER — NITROGLYCERIN IN D5W 200-5 MCG/ML-% IV SOLN
INTRAVENOUS | Status: DC | PRN
  Administered 2016-07-20: 10 ug/min via INTRAVENOUS

## 2016-07-20 MED ORDER — ALBUTEROL SULFATE (2.5 MG/3ML) 0.083% IN NEBU
2.5000 mg | INHALATION_SOLUTION | Freq: Once | RESPIRATORY_TRACT | Status: AC
  Administered 2016-07-20: 2.5 mg via RESPIRATORY_TRACT

## 2016-07-20 MED ORDER — SODIUM CHLORIDE 0.9 % IV SOLN
INTRAVENOUS | Status: DC | PRN
  Administered 2016-07-20: 100 mL/h via INTRAVENOUS

## 2016-07-20 MED ORDER — IPRATROPIUM BROMIDE 0.02 % IN SOLN
0.5000 mg | Freq: Once | RESPIRATORY_TRACT | Status: AC
  Administered 2016-07-20: 0.5 mg via RESPIRATORY_TRACT

## 2016-07-20 MED ORDER — ALBUTEROL SULFATE (2.5 MG/3ML) 0.083% IN NEBU: 3.0000 mL | INHALATION_SOLUTION | RESPIRATORY_TRACT | Status: DC | PRN

## 2016-07-20 MED ORDER — LIDOCAINE HCL (PF) 1 % IJ SOLN
INTRAMUSCULAR | Status: AC
  Filled 2016-07-20: qty 30

## 2016-07-20 MED ORDER — ASPIRIN 81 MG PO CHEW: 324.0000 mg | CHEWABLE_TABLET | Freq: Once | ORAL | Status: DC

## 2016-07-20 SURGICAL SUPPLY — 12 items
CATH 5FR JL3.5 JR4 ANG PIG MP (CATHETERS) ×2 IMPLANT
CATH VISTA GUIDE 6FR XB3.5 (CATHETERS) ×2 IMPLANT
DEVICE RAD COMP TR BAND LRG (VASCULAR PRODUCTS) ×2 IMPLANT
GLIDESHEATH SLEND SS 6F .021 (SHEATH) ×2 IMPLANT
HOVERMATT SINGLE USE (MISCELLANEOUS) ×2 IMPLANT
KIT HEART LEFT (KITS) ×2 IMPLANT
PACK CARDIAC CATHETERIZATION (CUSTOM PROCEDURE TRAY) ×2 IMPLANT
SYR MEDRAD MARK V 150ML (SYRINGE) IMPLANT
TRANSDUCER W/STOPCOCK (MISCELLANEOUS) ×2 IMPLANT
TUBING CIL FLEX 10 FLL-RA (TUBING) ×2 IMPLANT
WIRE EMERALD 3MM-J .035X260CM (WIRE) ×2 IMPLANT
WIRE HI TORQ VERSACORE-J 145CM (WIRE) ×2 IMPLANT

## 2016-07-20 NOTE — Progress Notes (Addendum)
Patient ID: [name] [name], female   DOB: 08-31-1956, 60 y.o.   MRN: [id number] [facility] [address], [place] [address] [phone]  By signing my name below, I, Essence Howell, attest that this documentation has been prepared under the direction and in the presence of Ivar Drape, PA-C Electronically Signed: Ladene Artist, ED Scribe 07/20/2016 at 9:21 AM.  Date:  07/20/2016   Name:  Tyjanae Carreras   DOB:  1956/03/28   MRN:  [id number]  PCP:  Pcp Not In System   History of Present Illness:  Tris Fratus is a 60 y.o. female patient who presents to [facility] complaining of shortness of breath for a week. Pt reports that she has felt a 'sandpaper and hot air" sensation across her chest for the past week. She states that she was in [employer] yesterday and had to sit down 4 times to rest yesterday to "catch her breath" just so she could make it to the car. Pt states that she has had to use her albuterol inhaler more often which she is currently using every 6 hours. She reports that sob is worsened with humidity and lifting items such as a chair and improves with sitting for 10-15 minutes. Pt reports associated symptoms of diaphoresis and visual disturbances during episodes of sob. She suspects that her BP drops in the afternoon; states that she has noticed "fluid pulsating in the corners of her eyes". Pt averages 4.5 hours of sleep per night and states that she lays flat on her stomach and sleeps with a cpap. Pt denies cough, sore throat, congestion, dizziness, nausea, blood in stools, melena, chest pain, wheezing. She reports h/o asthma and angina which she is treating with metoprolol and lisinopril. Pt is a former smoker; quit 24 years ago but reports a h/o smoking 1 ppd for 20 years.   Patient Active Problem List   Diagnosis Date Noted   Asthmatic bronchitis 04/26/2016   Allergic urticaria 04/26/2016   Environmental allergies 04/26/2016   Neck pain 04/26/2016    Anxiety 04/26/2016   Sinoatrial node dysfunction ([place]) 04/26/2016   Pacemaker Medtronic    Ventricular tachycardia, nonsustained-during exercise testing 03/07/2013   OSA (obstructive sleep apnea) 05/25/2011   Hx of adenomatous colonic polyps 05/25/2011   Migraine 05/25/2011   DM2 (diabetes mellitus, type 2) ([place]) 05/25/2011   GERD (gastroesophageal reflux disease) 05/25/2011   NAFLD (nonalcoholic fatty liver disease) 05/25/2011   Hyperlipidemia 05/25/2011   Severe essential hypertension [id number]   DIASTOLIC HEART FAILURE, CHRONIC 09/16/2009    Past Medical History:  Diagnosis Date   Allergy    Anal fissure    Anxiety    Arthritis    Asthma    Cataract 2010    left   Colon polyps    diverticulosis  03-2011/2005   Depression    Diabetes mellitus 2012   T2DM   Diverticulosis    Fatty liver 04/2011   GERD (gastroesophageal reflux disease)    Hyperlipidemia    Hypertension severe    Migraine    Obesity    OSA (obstructive sleep apnea)    C-Pap   Pacemaker Medtronic    Sinoatrial node dysfunction ([place])    syncope    Past Surgical History:  Procedure Laterality Date   ABDOMINAL HYSTERECTOMY  2000   total   EYE SURGERY Right 2010   OOPHORECTOMY     PACEMAKER PLACEMENT     T/A right knee     TONSILLECTOMY  Social History  Substance Use Topics   Smoking status: Former Smoker    Types: Cigarettes    Quit date: 10/12/1991   Smokeless tobacco: Never Used     Comment: Quit 20 yrs ago   Alcohol use No    Family History  Problem Relation Age of Onset   Heart disease Mother    Cancer Mother 62    lung   Breast cancer Mother     maternal grandmother   Diabetes Sister     type 2   Colon cancer Neg Hx    Breast cancer Brother    Cancer Maternal Grandmother     breast   Leukemia Paternal Grandmother     neoplast   Aneurysm Paternal Grandfather     abdominal (stomach)   Heart disease Father     Allergies   Allergen Reactions   Adhesive [Tape] Other (See Comments)    SKIN BLISTERS   Penicillins    Red Yeast Rice Hives   Sulfa Drugs Cross Reactors Hives    Medication list has been reviewed and updated.  Current Outpatient Prescriptions on File Prior to Visit  Medication Sig Dispense Refill   albuterol (PROAIR HFA) 108 (90 Base) MCG/ACT inhaler Inhale 2 puffs into the lungs as needed. 2 Inhaler 3   cetirizine (ZYRTEC) 10 MG tablet Take 10 mg by mouth daily.       Cholecalciferol (VITAMIN D) 2000 UNITS CAPS Take by mouth.       citalopram (CELEXA) 20 MG tablet TAKE 1 BY MOUTH DAILY 90 tablet 2   dicyclomine (BENTYL) 20 MG tablet Take 20 mg by mouth 3 (three) times daily.     fish oil-omega-3 fatty acids 1000 MG capsule Take 2 g by mouth daily. PER PATIENT TAKE 8000 MG DAILY     fluticasone (FLONASE) 50 MCG/ACT nasal spray Place 2 sprays into both nostrils daily. 48 g 3   fluticasone (FLOVENT HFA) 110 MCG/ACT inhaler Inhale 2 puffs into the lungs 2 (two) times daily. 1 Inhaler 12   gabapentin (NEURONTIN) 300 MG capsule TAKE 1 BY MOUTH IN THE MORNING AND 2 AT BEDTIME 270 capsule 2   glimepiride (AMARYL) 4 MG tablet Take 1/2 tablet twice a day before main meals. 90 tablet 1   glucose blood test strip Use as instructed 200 each 5   hydrOXYzine (ATARAX/VISTARIL) 25 MG tablet Take 1 tablet (25 mg total) by mouth every 8 (eight) hours as needed. 30 tablet 1   lisinopril-hydrochlorothiazide (PRINZIDE,ZESTORETIC) 20-12.5 MG tablet Take 1 tablet by mouth 2 (two) times daily. 180 tablet 2   losartan (COZAAR) 100 MG tablet Take 1 tablet (100 mg total) by mouth daily. 90 tablet 2   metoprolol succinate (TOPROL-XL) 50 MG 24 hr tablet Take 2 tablets (100 mg total) by mouth daily. Take with or immediately following a meal. 180 tablet 2   Multiple Vitamin (MULTIVITAMIN) tablet Take 1 tablet by mouth daily.       omeprazole (PRILOSEC) 20 MG capsule TAKE 1 BY MOUTH DAILY 90 capsule 2    ondansetron (ZOFRAN-ODT) 8 MG disintegrating tablet DISSOLVE 1 ON TONGUE EVERY 12 HOURS AS NEEDED FOR NAUSEA 30 tablet 3   traMADol (ULTRAM) 50 MG tablet Take 1 tablet (50 mg total) by mouth every 8 (eight) hours as needed. 30 tablet 2   triamcinolone cream (KENALOG) 0.1 % Apply 1 application topically as needed. 30 g 2   No current facility-administered medications on file prior to visit.  Review of Systems  Constitutional: Positive for diaphoresis.  HENT: Negative for congestion and sore throat.   Respiratory: Positive for shortness of breath. Negative for cough and wheezing.   Cardiovascular: Negative for chest pain.  Gastrointestinal: Negative for blood in stool, melena and nausea.  Neurological: Negative for dizziness.    Physical Examination: BP 128/86 (BP Location: Left Arm, Patient Position: Sitting, Cuff Size: Large)    Pulse 77    Temp 98.3 F (36.8 C) (Oral)    Resp 17    Ht 5\' 2"  (1.575 m)    Wt 239 lb (108.4 kg)    SpO2 93%    BMI 43.71 kg/m  Ideal Body Weight: @FLOWAMB [id number]  Physical Exam  Constitutional: She is oriented to person, place, and time. She appears well-developed and well-nourished. No distress.  HENT:  Head: Normocephalic and atraumatic.  Right Ear: External ear normal.  Left Ear: External ear normal.  Eyes: Conjunctivae and EOM are normal. Pupils are equal, round, and reactive to light.  Nothing sited with fundoscopic exam.  Anterior chamber appears normal.  Cardiovascular: Normal rate, regular rhythm and normal heart sounds.  Exam reveals no gallop and no friction rub.   No murmur heard. Pulses:      Carotid pulses are 2+ on the right side, and 2+ on the left side. Pulmonary/Chest: Effort normal. No accessory muscle usage. No apnea. No respiratory distress. She has no decreased breath sounds. She has no wheezes. She has no rhonchi.  Neurological: She is alert and oriented to person, place, and time.  Skin: She is not diaphoretic.   Psychiatric: She has a normal mood and affect. Her behavior is normal.   Results for orders placed or performed in visit on 07/20/16  COMPLETE METABOLIC PANEL WITH GFR  Result Value Ref Range   Sodium 140 135 - 146 mmol/L   Potassium 4.5 3.5 - 5.3 mmol/L   Chloride 102 98 - 110 mmol/L   CO2 29 20 - 31 mmol/L   Glucose, Bld 162 (H) 65 - 99 mg/dL   BUN 20 7 - 25 mg/dL   Creat 0.92 0.50 - 0.99 mg/dL   Total Bilirubin 0.5 0.2 - 1.2 mg/dL   Alkaline Phosphatase 68 33 - 130 U/L   AST 23 10 - 35 U/L   ALT 20 6 - 29 U/L   Total Protein 7.3 6.1 - 8.1 g/dL   Albumin 4.6 3.6 - 5.1 g/dL   Calcium 10.3 8.6 - 10.4 mg/dL   GFR, Est African American 77 >=60 mL/min   GFR, Est Non African American 67 >=60 mL/min  POCT CBC  Result Value Ref Range   WBC 6.6 4.6 - 10.2 K/uL   Lymph, poc 1.4 0.6 - 3.4   POC LYMPH PERCENT 21.9 10 - 50 %L   MID (cbc) 0.5 0 - 0.9   POC MID % 6.9 0 - 12 %M   POC Granulocyte 4.7 2 - 6.9   Granulocyte percent 71.2 37 - 80 %G   RBC 4.44 4.04 - 5.48 M/uL   Hemoglobin 14.0 12.2 - 16.2 g/dL   HCT, POC 39.7 37.7 - 47.9 %   MCV 89.5 80 - 97 fL   MCH, POC 31.4 (A) 27 - 31.2 pg   MCHC 35.2 31.8 - 35.4 g/dL   RDW, POC 13.3 %   Platelet Count, POC 239 142 - 424 K/uL   MPV 6.9 0 - 99.8 fL   Dg Chest 2 View  Result Date: 07/20/2016 CLINICAL  DATA:  SOB x 1 week EXAM: CHEST - 2 VIEW COMPARISON:  06/27/2014 FINDINGS: Stable left subclavian pacemaker.  No pneumothorax.  Lungs clear. Heart size and mediastinal contours are within normal limits. No effusion. Visualized bones unremarkable. IMPRESSION: No acute cardiopulmonary disease. Electronically Signed   By: Lucrezia Europe M.D.   On: 07/20/2016 10:09    Peak flow 380-390 post duo nebulizer  Assessment and Plan: Ezra Eke is a 60 y.o. female who is here today for cc of sob and chest pain. ekg reviewed by MD, and found to be unremarkable.  Duo neb not helpful at this time.   Did multiple laps around office without change of  o2.  Running 93-94% At this time, cardiology consult is appreciated at this time.  Sending urgent referral.  I do not want to start a prednisone at this time, if htn is causing the pulsating visions she states.   SOB (shortness of breath) - Plan: POCT CBC, COMPLETE METABOLIC PANEL WITH GFR, EKG 12-Lead, Orthostatic vital signs, Pulse oximetry (single), DG Chest 2 View, albuterol (PROVENTIL) (2.5 MG/3ML) 0.083% nebulizer solution 2.5 mg, ipratropium (ATROVENT) nebulizer solution 0.5 mg, Ambulatory referral to Cardiology, CANCELED: Ambulatory referral to Pulmonology  Chest pain, unspecified type - Plan: POCT CBC, COMPLETE METABOLIC PANEL WITH GFR, Orthostatic vital signs, Pulse oximetry (single), Ambulatory referral to Cardiology, CANCELED: Ambulatory referral to Pulmonology  Ivar Drape, PA-C [facility] [place] [facility] 10/11/20177:33 AM I personally performed the services described in this documentation, which was scribed in my presence. The recorded information has been reviewed and is accurate.

## 2016-07-20 NOTE — Patient Instructions (Addendum)
I am placing an urgent referral with your cardiologist.  Please await contact.    IF you received an x-ray today, you will receive an invoice from [facility]. Please contact [facility] at [phone] with questions or concerns regarding your invoice.   IF you received labwork today, you will receive an invoice from [employer]. Please contact [facility] at [phone] with questions or concerns regarding your invoice.   Our billing staff will not be able to assist you with questions regarding bills from these companies.  You will be contacted with the lab results as soon as they are available. The fastest way to get your results is to activate your My Chart account. Instructions are located on the last page of this paperwork. If you have not heard from [place] regarding the results in 2 weeks, please contact this office.

## 2016-07-20 NOTE — Progress Notes (Signed)
ANTICOAGULATION CONSULT NOTE - Initial Consult  Pharmacy Consult for heparin  Indication: chest pain/ACS  Allergies  Allergen Reactions  . Adhesive [Tape] Other (See Comments)    SKIN BLISTERS  . Penicillins   . Red Yeast Rice Hives  . Sulfa Drugs Cross Reactors Hives    Patient Measurements: Heparin dosing wt: 76.4kg  Vital Signs: Temp: 98.1 F (36.7 C) (10/10 1407) Temp Source: Oral (10/10 1407) BP: 131/75 (10/10 1646) Pulse Rate: 0 (10/10 1651)  Labs:  Recent Labs  07/20/16 1012 07/20/16 1407  HGB 14.0 14.2  HCT 39.7 40.6  PLT  --  212  CREATININE  --  0.96    Estimated Creatinine Clearance: 70.4 mL/min (by C-G formula based on SCr of 0.96 mg/dL).   Medical History: Past Medical History:  Diagnosis Date  . Allergy   . Anal fissure   . Anxiety   . Arthritis   . Asthma   . Cataract 2010    left  . Colon polyps    diverticulosis  03-2011/2005  . Depression   . Diabetes mellitus 2012   T2DM  . Diverticulosis   . Fatty liver 04/2011  . GERD (gastroesophageal reflux disease)   . Hyperlipidemia   . Hypertension severe   . Migraine   . Obesity   . OSA (obstructive sleep apnea)    C-Pap  . Pacemaker Medtronic   . Sinoatrial node dysfunction ([facility])    syncope    Medications:  Prescriptions Prior to Admission  Medication Sig Dispense Refill Last Dose  . albuterol (PROAIR HFA) 108 (90 Base) MCG/ACT inhaler Inhale 2 puffs into the lungs as needed. 2 Inhaler 3 Taking  . cetirizine (ZYRTEC) 10 MG tablet Take 10 mg by mouth daily.     Taking  . Cholecalciferol (VITAMIN D) 2000 UNITS CAPS Take by mouth.     Taking  . citalopram (CELEXA) 20 MG tablet TAKE 1 BY MOUTH DAILY 90 tablet 2 Taking  . dicyclomine (BENTYL) 20 MG tablet Take 20 mg by mouth 3 (three) times daily.   Taking  . fish oil-omega-3 fatty acids 1000 MG capsule Take 2 g by mouth daily. PER PATIENT TAKE 8000 MG DAILY   Taking  . fluticasone (FLONASE) 50 MCG/ACT nasal spray Place 2 sprays into  both nostrils daily. 48 g 3 Taking  . fluticasone (FLOVENT HFA) 110 MCG/ACT inhaler Inhale 2 puffs into the lungs 2 (two) times daily. 1 Inhaler 12 Taking  . gabapentin (NEURONTIN) 300 MG capsule TAKE 1 BY MOUTH IN THE MORNING AND 2 AT BEDTIME 270 capsule 2 Taking  . glimepiride (AMARYL) 4 MG tablet Take 1/2 tablet twice a day before main meals. 90 tablet 1 Taking  . glucose blood test strip Use as instructed 200 each 5 Taking  . hydrOXYzine (ATARAX/VISTARIL) 25 MG tablet Take 1 tablet (25 mg total) by mouth every 8 (eight) hours as needed. 30 tablet 1 Taking  . lisinopril-hydrochlorothiazide (PRINZIDE,ZESTORETIC) 20-12.5 MG tablet Take 1 tablet by mouth 2 (two) times daily. 180 tablet 2 Taking  . losartan (COZAAR) 100 MG tablet Take 1 tablet (100 mg total) by mouth daily. 90 tablet 2 Taking  . metoprolol succinate (TOPROL-XL) 50 MG 24 hr tablet Take 2 tablets (100 mg total) by mouth daily. Take with or immediately following a meal. 180 tablet 2 Taking  . Multiple Vitamin (MULTIVITAMIN) tablet Take 1 tablet by mouth daily.     Taking  . omeprazole (PRILOSEC) 20 MG capsule TAKE 1 BY MOUTH DAILY  90 capsule 2 Taking  . ondansetron (ZOFRAN-ODT) 8 MG disintegrating tablet DISSOLVE 1 ON TONGUE EVERY 12 HOURS AS NEEDED FOR NAUSEA 30 tablet 3 Taking  . traMADol (ULTRAM) 50 MG tablet Take 1 tablet (50 mg total) by mouth every 8 (eight) hours as needed. 30 tablet 2 Taking  . triamcinolone cream (KENALOG) 0.1 % Apply 1 application topically as needed. 30 g 2 Taking   Scheduled:  . [START ON 07/21/2016] aspirin EC  325 mg Oral Daily  . atorvastatin  80 mg Oral q1800  . [START ON 07/21/2016] citalopram  20 mg Oral Daily  . dicyclomine  20 mg Oral TID  . fluticasone  2 spray Each Nare Daily  . [START ON 07/21/2016] gabapentin  300 mg Oral q morning - 10a  . gabapentin  600 mg Oral QHS  . [START ON 07/21/2016] glimepiride  2 mg Oral Q breakfast  . [START ON 07/21/2016] insulin aspart  0-20 Units  Subcutaneous TID WC  . [START ON 07/21/2016] loratadine  10 mg Oral Daily  . [START ON 07/21/2016] metoprolol succinate  100 mg Oral Daily  . pantoprazole  40 mg Oral Daily  . sodium chloride flush  3 mL Intravenous Q12H    Assessment: 60 yo female s/p STEMI with severe 3VCAD for possible CABG. Pharmacy consulted to dose heparin (sheath removed at ~ 4:45pm); TR band placed)  Goal of Therapy:  Heparin level 0.3-0.7 units/ml Monitor platelets by anticoagulation protocol: Yes   Plan:  -Will begin heparin at 1050 units/hr 8 hours post sheath removal -Heparin level in 6 hours and daily wth CBC daily  Hildred Laser, Pharm D 07/20/2016 5:18 PM

## 2016-07-20 NOTE — ED Notes (Signed)
Cath team at bedside. Defibrillator pads pulled from pyxis and taken with pt to cath lab

## 2016-07-20 NOTE — ED Triage Notes (Signed)
Pt states "ive got extreme pain in my chest that radiates in my back and my arm." Pt thinks its her asthma so she hasn't thought about it much. Went to her PCP but she cant get rid of the chest pain. Denies any other symptoms. Pt has pacemaker as needed, medtronic. 7/10.

## 2016-07-20 NOTE — Progress Notes (Signed)
Day of Surgery Procedure(s) (LRB): Left Heart Cath and Coronary Angiography (N/A) Subjective: Patient examined, coronary angiograms personally reviewed and counseled with patient. Full consult to follow. Very nice 60 year old obese type II diabetic nonsmoker presents with unstable angina and positive cardiac enzymes. Urgent cardiac catheterization demonstrates severe multivessel coronary disease with normal LV systolic function. Echocardiogram is pending. On presentation her LVEDP was 26. Lactic acid 3.2. The patient is currently stable in the CCU without pain.  Objective: Vital signs in last 24 hours: Temp:  [97.9 F (36.6 C)-98.3 F (36.8 C)] 97.9 F (36.6 C) (10/10 2000) Pulse Rate:  [0-87] 62 (10/10 2000) Cardiac Rhythm: Normal sinus rhythm (10/10 2000) Resp:  [0-33] 33 (10/10 2000) BP: (110-185)/(61-91) 124/69 (10/10 2000) SpO2:  [0 %-98 %] 95 % (10/10 2000) Weight:  [239 lb (108.4 kg)] 239 lb (108.4 kg) (10/10 0849)  Hemodynamic parameters for last 24 hours:  normal sinus rhythm, afebrile  Intake/Output from previous day: No intake/output data recorded. Intake/Output this shift: No intake/output data recorded.       Exam    General- alert and comfortable   Lungs- clear without rales, wheezes   Cor- regular rate and rhythm, no murmur , gallop   Abdomen- soft, non-tender, obese   Extremities - warm, non-tender, minimal edema, no hematoma right wrist   Neuro- oriented, appropriate, no focal weakness   Lab Results:  Recent Labs  07/20/16 1407 07/20/16 1740  WBC 7.7 7.5  HGB 14.2 13.0  HCT 40.6 37.5  PLT 212 205   BMET:  Recent Labs  07/20/16 0956 07/20/16 1407  NA 140 137  K 4.5 4.5  CL 102 101  CO2 29 25  GLUCOSE 162* 146*  BUN 20 18  CREATININE 0.92 0.96  CALCIUM 10.3 10.5*    PT/INR: No results for input(s): LABPROT, INR in the last 72 hours. ABG    Component Value Date/Time   TCO2 26 10/25/2008 2213   CBG (last 3)  No results for  input(s): GLUCAP in the last 72 hours.  Assessment/Plan: S/P Procedure(s) (LRB): Left Heart Cath and Coronary Angiography (N/A) The patient would benefit from multivessel CABG. This will be scheduled later this week. Her diabetes appears to be fairly well controlled. Chest x-ray is clear. We'll get follow-up lactic acid in a.m. Check PTH level for hypercalcemia.   LOS: 0 days    Tharon Aquas Trigt III 07/20/2016

## 2016-07-20 NOTE — H&P (Signed)
History & Physical    Patient ID: [name] [name] MRN: [id number], DOB/AGE: 62-Aug-1957   Admit date: 07/20/2016  Primary Physician: Pcp Not In System Primary Cardiologist: Dr. Caryl Comes  History of Present Illness    [name] [name] is a 60 y.o. female with past medical history of sinus node dysfunction (s/p PPM placement), Type 2 DM, HTN, and HLD who presents to [facility] ED on 07/20/2016 for evaluation of chest pain and dyspnea.   She reports having worsening dyspnea for the past 3 days, saying she thought she was having an asthma exacerbation. Had been using her Albuterol inhaler with minimal relief. Today, she developed a hot sensation across her chest which prompted her to come to the ED.   She denies any recent exertional chest pain. Had been feeling well until the past 3 days. No known history of CAD. Reports having a clean cath in the past.   Initial labs show a WBC of 7.7, Hgb 14.2, platelets 212. Creatinine 0.96. K+ 4.5. Initial troponin 0.15 with repeat value of 0.29. Initial lactic acid of 3.41. EKG shows NSR, HR 84, with ST elevation in leads V2 and V3. CXR with no acute cardiopulmonary disease.   With ST elevation on EKG and continued chest discomfort, she was brought to the cardiac catheterization lab for an emergent procedure.    Past Medical History    Past Medical History:  Diagnosis Date  . Allergy   . Anal fissure   . Anxiety   . Arthritis   . Asthma   . Cataract 2010    left  . Colon polyps    diverticulosis  03-2011/2005  . Depression   . Diabetes mellitus 2012   T2DM  . Diverticulosis   . Fatty liver 04/2011  . GERD (gastroesophageal reflux disease)   . Hyperlipidemia   . Hypertension severe   . Migraine   . Obesity   . OSA (obstructive sleep apnea)    C-Pap  . Pacemaker Medtronic   . Sinoatrial node dysfunction (HCC)    syncope    Past Surgical History:  Procedure Laterality Date  . ABDOMINAL HYSTERECTOMY  2000   total  . EYE SURGERY Right  2010  . OOPHORECTOMY    . PACEMAKER PLACEMENT    . T/A right knee    . TONSILLECTOMY       Allergies  Allergies  Allergen Reactions  . Adhesive [Tape] Other (See Comments)    SKIN BLISTERS  . Penicillins   . Red Yeast Rice Hives  . Sulfa Drugs Cross Reactors Hives     Home Medications    Prior to Admission medications   Medication Sig Start Date End Date Taking? Authorizing Provider  albuterol (PROAIR HFA) 108 (90 Base) MCG/ACT inhaler Inhale 2 puffs into the lungs as needed. 04/26/16   Ezekiel Slocumb, PA-C  cetirizine (ZYRTEC) 10 MG tablet Take 10 mg by mouth daily.      Historical Provider, MD  Cholecalciferol (VITAMIN D) 2000 UNITS CAPS Take by mouth.      Historical Provider, MD  citalopram (CELEXA) 20 MG tablet TAKE 1 BY MOUTH DAILY 04/26/16   Ezekiel Slocumb, PA-C  dicyclomine (BENTYL) 20 MG tablet Take 20 mg by mouth 3 (three) times daily.    Historical Provider, MD  fish oil-omega-3 fatty acids 1000 MG capsule Take 2 g by mouth daily. PER PATIENT TAKE 8000 MG DAILY    Historical Provider, MD  fluticasone (FLONASE) 50 MCG/ACT nasal spray Place  2 sprays into both nostrils daily. 04/26/16   Ezekiel Slocumb, PA-C  fluticasone (FLOVENT HFA) 110 MCG/ACT inhaler Inhale 2 puffs into the lungs 2 (two) times daily. 04/26/16   Ezekiel Slocumb, PA-C  gabapentin (NEURONTIN) 300 MG capsule TAKE 1 BY MOUTH IN THE MORNING AND 2 AT BEDTIME 04/26/16   Ezekiel Slocumb, PA-C  glimepiride (AMARYL) 4 MG tablet Take 1/2 tablet twice a day before main meals. 04/26/16   Ezekiel Slocumb, PA-C  glucose blood test strip Use as instructed 03/06/15   Barton Fanny, MD  hydrOXYzine (ATARAX/VISTARIL) 25 MG tablet Take 1 tablet (25 mg total) by mouth every 8 (eight) hours as needed. 04/26/16   Ezekiel Slocumb, PA-C  lisinopril-hydrochlorothiazide (PRINZIDE,ZESTORETIC) 20-12.5 MG tablet Take 1 tablet by mouth 2 (two) times daily. 04/26/16   Ezekiel Slocumb, PA-C  losartan (COZAAR) 100 MG tablet Take 1 tablet (100 mg total)  by mouth daily. 04/26/16   Ezekiel Slocumb, PA-C  metoprolol succinate (TOPROL-XL) 50 MG 24 hr tablet Take 2 tablets (100 mg total) by mouth daily. Take with or immediately following a meal. 04/26/16   Ezekiel Slocumb, PA-C  Multiple Vitamin (MULTIVITAMIN) tablet Take 1 tablet by mouth daily.      Historical Provider, MD  omeprazole (PRILOSEC) 20 MG capsule TAKE 1 BY MOUTH DAILY 04/26/16   Ezekiel Slocumb, PA-C  ondansetron (ZOFRAN-ODT) 8 MG disintegrating tablet DISSOLVE 1 ON TONGUE EVERY 12 HOURS AS NEEDED FOR NAUSEA 04/26/16   Ezekiel Slocumb, PA-C  traMADol (ULTRAM) 50 MG tablet Take 1 tablet (50 mg total) by mouth every 8 (eight) hours as needed. 04/26/16   Ezekiel Slocumb, PA-C  triamcinolone cream (KENALOG) 0.1 % Apply 1 application topically as needed. 04/26/16   Ezekiel Slocumb, PA-C    Family History    Family History  Problem Relation Age of Onset  . Heart disease Mother   . Cancer Mother 2    lung  . Breast cancer Mother     maternal grandmother  . Diabetes Sister     type 2  . Breast cancer Brother   . Cancer Maternal Grandmother     breast  . Leukemia Paternal Grandmother     neoplast  . Aneurysm Paternal Grandfather     abdominal (stomach)  . Heart disease Father   . Colon cancer Neg Hx     Social History    Social History   Social History  . Marital status: Single    Spouse name: N/A  . Number of children: 0  . Years of education: N/A   Occupational History  . [employer]   Social History Main Topics  . Smoking status: Former Smoker    Types: Cigarettes    Quit date: 10/12/1991  . Smokeless tobacco: Never Used     Comment: Quit 20 yrs ago  . Alcohol use No  . Drug use: No  . Sexual activity: Yes    Birth control/ protection: Post-menopausal, Other-see comments     Comment: Monogamous    Other Topics Concern  . Not on file   Social History Narrative   Single. Exercise: No. Education: College.     Review of Systems    General:  No chills,  fever, night sweats or weight changes.  Cardiovascular:  No edema, orthopnea, palpitations, paroxysmal nocturnal dyspnea. Positive for chest pain and dyspnea with exertion.  Dermatological: No rash, lesions/masses Respiratory: No cough, Positive for  dyspnea. Urologic: No hematuria, dysuria Abdominal:   No nausea, vomiting, diarrhea, bright red blood per rectum, melena, or hematemesis Neurologic:  No visual changes, wkns, changes in mental status. All other systems reviewed and are otherwise negative except as noted above.  Physical Exam    Blood pressure 136/73, pulse 76, temperature 98.1 F (36.7 C), temperature source Oral, resp. rate 16, SpO2 98 %.  General: Well developed, well nourished,female appearing in no acute distress. Head: Normocephalic, atraumatic, sclera non-icteric, no xanthomas, nares are without discharge. Neck: No carotid bruits. JVD not elevated.  Lungs: Respirations regular and unlabored, without wheezes or rales.  Heart: Regular rate and rhythm. No S3 or S4.  No murmur, no rubs, or gallops appreciated. Abdomen: Soft, non-tender, non-distended with normoactive bowel sounds. No hepatomegaly. No rebound/guarding. No obvious abdominal masses. Msk:  Strength and tone appear normal for age. No joint deformities or effusions. Extremities: No clubbing or cyanosis. No edema.  Distal pedal pulses are 2+ bilaterally. Neuro: Alert and oriented X 3. Moves all extremities spontaneously. No focal deficits noted. Psych:  Responds to questions appropriately with a normal affect. Skin: No rashes or lesions noted  Labs    Troponin (Point of Care Test)  Recent Labs  07/20/16 1552  TROPIPOC 0.29*   No results for input(s): CKTOTAL, CKMB, TROPONINI in the last 72 hours. Lab Results  Component Value Date   WBC 7.7 07/20/2016   HGB 14.2 07/20/2016   HCT 40.6 07/20/2016   MCV 88.8 07/20/2016   PLT 212 07/20/2016     Recent Labs Lab 07/20/16 1407  NA 137  K 4.5  CL 101    CO2 25  BUN 18  CREATININE 0.96  CALCIUM 10.5*  GLUCOSE 146*   Lab Results  Component Value Date   CHOL 277 (H) 05/01/2014   HDL 44 05/01/2014   LDLCALC 178 (H) 05/01/2014   TRIG 273 (H) 05/01/2014   No results found for: DDIMER   Brain Natriuretic Peptide  Date/Time Value Ref Range Status  06/27/2014 12:11 PM 196.3 (H) 0.0 - 100.0 pg/mL Final   No results found for: PROBNP No results for input(s): INR in the last 72 hours.    Radiology Studies    Dg Chest 2 View  Result Date: 07/20/2016 CLINICAL DATA:  SOB x 1 week EXAM: CHEST - 2 VIEW COMPARISON:  06/27/2014 FINDINGS: Stable left subclavian pacemaker.  No pneumothorax.  Lungs clear. Heart size and mediastinal contours are within normal limits. No effusion. Visualized bones unremarkable. IMPRESSION: No acute cardiopulmonary disease. Electronically Signed   By: Lucrezia Europe M.D.   On: 07/20/2016 10:09    EKG & Cardiac Imaging    EKG: NSR, HR 84, with ST elevation in leads V2 and V3.   ECHOCARDIOGRAM: None on File  Assessment & Plan    1. 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 She presents with worsening dyspnea for the past 3 days, saying she thought she was having an asthma exacerbation. Developed chest discomfort this morning which prompted her to come to the ED.  - initial troponin elevated to 0.15 with repeat value of 0.29. EKG shows NSR, HR 84, with ST elevation in leads V2 and V3.  - taken to the cath lab for emergent cardiac cath. Further recommendations pending following cath results.   2. Essential hypertension - Toprol-XL, Losartan, and Lisinopril-HCTZ are listed on  current medication list. - continue Toprol-XL. Need to verify if on ACE-I/ARB prior to reordering. Should not be on both.   3. Chronic Diastolic CHF - no recent echo on file. Does not appear volume overloaded  on physical exam. - Losartan along with Lisinopril-HCTZ are both listed on patient's med list prior to admission. Need to verify which she is on. Will hold both with current cath.   4. Sinus Node Dysfunction - s/p PPM placement. Followed by Dr. Caryl Comes  Signed, Erma Heritage, PA-C 07/20/2016, 4:45 PM Pager: [phone] Patient seen and examined and history reviewed. Agree with above findings and plan. 60 yo WF with multiple cardiac risk factors presents with off and on chest pain for last 2 days. Today at 1:30 pm pain developed radiating to left arm and associated with SOB. Pain more intense and lasted longer. Ecg showed ST depression in V1-3 and posterior leads c/w posterior infarct. Troponin mildly elevated and trending up. Pain has now resolved but given concern that this is a true posterior infarct we will proceed with emergent cardiac cath and possible intervention.  [name] [name] [place], [place] 07/20/2016 5:25 PM

## 2016-07-20 NOTE — ED Provider Notes (Signed)
[facility] DEPT Provider Note   CSN: [id number] Arrival date & time: 07/20/16  1357     History   Chief Complaint Chief Complaint  Patient presents with  . Chest Pain    HPI Socorra Baltes is a 60 y.o. female.  HPI   Patient is a 60 year old female presenting with chest pain. Patient was seen at her urgent care this morning for chest pain shortness of breath. She's been having intermittent pain radiating into her left shoulder since last night. However she reports it got worse today at 1 PM and she went to urgent care. She had EKG and chest x-ray done which were normal and she was sent here for further evaluation.   Patient has had difficulty with shortness of breath for the last 2 years with extensive workup. Patient has AICD in with Dr. Caryl Comes for bradycardia.  Past Medical History:  Diagnosis Date  . Allergy   . Anal fissure   . Anxiety   . Arthritis   . Asthma   . Cataract 2010    left  . Colon polyps    diverticulosis  03-2011/2005  . Depression   . Diabetes mellitus 2012   T2DM  . Diverticulosis   . Fatty liver 04/2011  . GERD (gastroesophageal reflux disease)   . Hyperlipidemia   . Hypertension severe   . Migraine   . Obesity   . OSA (obstructive sleep apnea)    C-Pap  . Pacemaker Medtronic   . Sinoatrial node dysfunction (HCC)    syncope    Patient Active Problem List   Diagnosis Date Noted  . Asthmatic bronchitis 04/26/2016  . Allergic urticaria 04/26/2016  . Environmental allergies 04/26/2016  . Neck pain 04/26/2016  . Anxiety 04/26/2016  . Sinoatrial node dysfunction (HCC) 04/26/2016  . Pacemaker Medtronic   . Ventricular tachycardia, nonsustained-during exercise testing 03/07/2013  . OSA (obstructive sleep apnea) 05/25/2011  . Hx of adenomatous colonic polyps 05/25/2011  . Migraine 05/25/2011  . DM2 (diabetes mellitus, type 2) ([place]) 05/25/2011  . GERD (gastroesophageal reflux disease) 05/25/2011  . NAFLD (nonalcoholic fatty liver  disease) 05/25/2011  . Hyperlipidemia 05/25/2011  . Severe essential hypertension 09/16/2009  . DIASTOLIC HEART FAILURE, CHRONIC 09/16/2009    Past Surgical History:  Procedure Laterality Date  . ABDOMINAL HYSTERECTOMY  2000   total  . EYE SURGERY Right 2010  . OOPHORECTOMY    . PACEMAKER PLACEMENT    . T/A right knee    . TONSILLECTOMY      OB History    No data available       Home Medications    Prior to Admission medications   Medication Sig Start Date End Date Taking? Authorizing Provider  albuterol (PROAIR HFA) 108 (90 Base) MCG/ACT inhaler Inhale 2 puffs into the lungs as needed. 04/26/16   Ezekiel Slocumb, PA-C  cetirizine (ZYRTEC) 10 MG tablet Take 10 mg by mouth daily.      Historical Provider, MD  Cholecalciferol (VITAMIN D) 2000 UNITS CAPS Take by mouth.      Historical Provider, MD  citalopram (CELEXA) 20 MG tablet TAKE 1 BY MOUTH DAILY 04/26/16   Ezekiel Slocumb, PA-C  dicyclomine (BENTYL) 20 MG tablet Take 20 mg by mouth 3 (three) times daily.    Historical Provider, MD  fish oil-omega-3 fatty acids 1000 MG capsule Take 2 g by mouth daily. PER PATIENT TAKE 8000 MG DAILY    Historical Provider, MD  fluticasone (FLONASE) 50 MCG/ACT nasal spray Place  2 sprays into both nostrils daily. 04/26/16   Ezekiel Slocumb, PA-C  fluticasone (FLOVENT HFA) 110 MCG/ACT inhaler Inhale 2 puffs into the lungs 2 (two) times daily. 04/26/16   Ezekiel Slocumb, PA-C  gabapentin (NEURONTIN) 300 MG capsule TAKE 1 BY MOUTH IN THE MORNING AND 2 AT BEDTIME 04/26/16   Ezekiel Slocumb, PA-C  glimepiride (AMARYL) 4 MG tablet Take 1/2 tablet twice a day before main meals. 04/26/16   Ezekiel Slocumb, PA-C  glucose blood test strip Use as instructed 03/06/15   Barton Fanny, MD  hydrOXYzine (ATARAX/VISTARIL) 25 MG tablet Take 1 tablet (25 mg total) by mouth every 8 (eight) hours as needed. 04/26/16   Ezekiel Slocumb, PA-C  lisinopril-hydrochlorothiazide (PRINZIDE,ZESTORETIC) 20-12.5 MG tablet Take 1 tablet by  mouth 2 (two) times daily. 04/26/16   Ezekiel Slocumb, PA-C  losartan (COZAAR) 100 MG tablet Take 1 tablet (100 mg total) by mouth daily. 04/26/16   Ezekiel Slocumb, PA-C  metoprolol succinate (TOPROL-XL) 50 MG 24 hr tablet Take 2 tablets (100 mg total) by mouth daily. Take with or immediately following a meal. 04/26/16   Ezekiel Slocumb, PA-C  Multiple Vitamin (MULTIVITAMIN) tablet Take 1 tablet by mouth daily.      Historical Provider, MD  omeprazole (PRILOSEC) 20 MG capsule TAKE 1 BY MOUTH DAILY 04/26/16   Ezekiel Slocumb, PA-C  ondansetron (ZOFRAN-ODT) 8 MG disintegrating tablet DISSOLVE 1 ON TONGUE EVERY 12 HOURS AS NEEDED FOR NAUSEA 04/26/16   Ezekiel Slocumb, PA-C  traMADol (ULTRAM) 50 MG tablet Take 1 tablet (50 mg total) by mouth every 8 (eight) hours as needed. 04/26/16   Ezekiel Slocumb, PA-C  triamcinolone cream (KENALOG) 0.1 % Apply 1 application topically as needed. 04/26/16   Ezekiel Slocumb, PA-C    Family History Family History  Problem Relation Age of Onset  . Heart disease Mother   . Cancer Mother 7    lung  . Breast cancer Mother     maternal grandmother  . Diabetes Sister     type 2  . Breast cancer Brother   . Cancer Maternal Grandmother     breast  . Leukemia Paternal Grandmother     neoplast  . Aneurysm Paternal Grandfather     abdominal (stomach)  . Heart disease Father   . Colon cancer Neg Hx     Social History Social History  Substance Use Topics  . Smoking status: Former Smoker    Types: Cigarettes    Quit date: 10/12/1991  . Smokeless tobacco: Never Used     Comment: Quit 20 yrs ago  . Alcohol use No     Allergies   Adhesive [tape]; Penicillins; Red yeast rice; and Sulfa drugs cross reactors   Review of Systems Review of Systems  Constitutional: Negative for fatigue and fever.  Respiratory: Positive for shortness of breath.   Cardiovascular: Positive for chest pain.  Gastrointestinal: Negative for abdominal distention.  Genitourinary: Negative for  difficulty urinating.  Neurological: Negative for dizziness.  All other systems reviewed and are negative.    Physical Exam Updated Vital Signs BP 136/73   Pulse 76   Temp 98.1 F (36.7 C) (Oral)   Resp 16   SpO2 98%   Physical Exam  Constitutional: She is oriented to person, place, and time. She appears well-developed and well-nourished.  HENT:  Head: Normocephalic and atraumatic.  Eyes: Right eye exhibits no discharge.  Cardiovascular: Normal rate, regular rhythm and  normal heart sounds.   No murmur heard. Pacemaker.  Pulmonary/Chest: Effort normal and breath sounds normal. She has no wheezes. She has no rales.  Abdominal: Soft. She exhibits no distension. There is no tenderness.  Neurological: She is oriented to person, place, and time.  Skin: Skin is warm and dry. She is not diaphoretic.  Psychiatric: She has a normal mood and affect.  Nursing note and vitals reviewed.    ED Treatments / Results  Labs (all labs ordered are listed, but only abnormal results are displayed) Labs Reviewed  BASIC METABOLIC PANEL - Abnormal; Notable for the following:       Result Value   Glucose, Bld 146 (*)    Calcium 10.5 (*)    All other components within normal limits  I-STAT TROPOININ, ED - Abnormal; Notable for the following:    Troponin i, poc 0.15 (*)    All other components within normal limits  I-STAT CG4 LACTIC ACID, ED - Abnormal; Notable for the following:    Lactic Acid, Venous 3.41 (*)    All other components within normal limits  POCT I-STAT TROPONIN I - Abnormal; Notable for the following:    Troponin i, poc 0.29 (*)    All other components within normal limits  URINE CULTURE  CBC  CBC WITH DIFFERENTIAL/PLATELET  URINALYSIS, ROUTINE W REFLEX MICROSCOPIC (NOT AT [facility])  BRAIN NATRIURETIC PEPTIDE  I-STAT TROPOININ, ED    EKG  EKG Interpretation  Date/Time:  Tuesday July 20 2016 14:07:56 EDT Ventricular Rate:  72 PR Interval:  200 QRS Duration: 106 QT  Interval:  448 QTC Calculation: 490 R Axis:   -8 Text Interpretation:  Normal sinus rhythm Prolonged QT Abnormal ECG mild ST depression in V2  Confirmed by Thomasene Lot, [place] ([id number]) on 07/20/2016 2:48:16 PM Also confirmed by Gerald Leitz ([id number])  on 07/20/2016 3:29:55 PM       Radiology Dg Chest 2 View  Result Date: 07/20/2016 CLINICAL DATA:  SOB x 1 week EXAM: CHEST - 2 VIEW COMPARISON:  06/27/2014 FINDINGS: Stable left subclavian pacemaker.  No pneumothorax.  Lungs clear. Heart size and mediastinal contours are within normal limits. No effusion. Visualized bones unremarkable. IMPRESSION: No acute cardiopulmonary disease. Electronically Signed   By: Lucrezia Europe M.D.   On: 07/20/2016 10:09    Procedures Procedures (including critical care time)  Medications Ordered in ED Medications  nitroGLYCERIN (NITROSTAT) SL tablet 0.4 mg ( Sublingual MAR Hold 07/20/16 1611)  aspirin chewable tablet 324 mg ( Oral MAR Hold 07/20/16 1611)  lidocaine (PF) (XYLOCAINE) 1 % injection (3 mLs  Given 07/20/16 1619)  0.9 %  sodium chloride infusion (100 mL/hr Intravenous New Bag/Given 07/20/16 1620)  Radial Cocktail/Verapamil only (10 mLs Intra-arterial Given 07/20/16 1621)  aspirin chewable tablet 324 mg (324 mg Oral Given 07/20/16 1536)  heparin 5000 UNIT/ML injection (4,000 Units  Given 07/20/16 1611)     Initial Impression / Assessment and Plan / ED Course  I have reviewed the triage vital signs and the nursing notes.  Pertinent labs & imaging results that were available during my care of the patient were reviewed by me and considered in my medical decision making (see chart for details).  Clinical Course    Patient is a 60 year old female presenting with shortness of breath chest pain. Patient says his shortness of breath for years but got acutely worse last day. She has reports chest pain. Seen in urgent care sent here for evaluation. Patient appears comfortable however states a 7  out of 10  chest pain radiated to her left arm which is concerning. Could consider pulmonary embolism given her troponin leak and chest pain and short of breath. EKG in the waiting room showed very subtle depressions in V2 V3 so we ordered an new EKG when patient arrived in her room.  4:30 PM New EKG shows v2 v3 depressions. Patient has pain so we will do posterior EKG  Posterior EKG shows elevations in 7, 8, 9. We will call Cath Lab physician to discuss potentially calling code STEMI..   Interventional Cardiologist agreed. We'll activate code STEMI. Patient given aspirin. We'll take to Cath Lab.   CRITICAL CARE Performed by: Gardiner Sleeper Total critical care time: 30 minutes Critical care time was exclusive of separately billable procedures and treating other patients. Critical care was necessary to treat or prevent imminent or life-threatening deterioration. Critical care was time spent personally by me on the following activities: development of treatment plan with patient and/or surrogate as well as nursing, discussions with consultants, evaluation of patient's response to treatment, examination of patient, obtaining history from patient or surrogate, ordering and performing treatments and interventions, ordering and review of laboratory studies, ordering and review of radiographic studies, pulse oximetry and re-evaluation of patient's condition.    Final Clinical Impressions(s) / ED Diagnoses   Final diagnoses:  None    New Prescriptions Current Discharge Medication List       Lear Carstens Julio Alm, MD 07/20/16 1631

## 2016-07-20 NOTE — Progress Notes (Signed)
TR band removed from right radial site @ 2040. No bleeding noted to site, no hematoma noted. Pt tolerated procedure well. Will continue to closely monitor.  Sherlie Ban, RN

## 2016-07-21 ENCOUNTER — Encounter (HOSPITAL_COMMUNITY): Payer: Self-pay | Admitting: Cardiology

## 2016-07-21 ENCOUNTER — Other Ambulatory Visit: Payer: Self-pay | Admitting: *Deleted

## 2016-07-21 ENCOUNTER — Inpatient Hospital Stay (HOSPITAL_COMMUNITY): Payer: BLUE CROSS/BLUE SHIELD

## 2016-07-21 ENCOUNTER — Other Ambulatory Visit: Payer: Self-pay

## 2016-07-21 DIAGNOSIS — E1165 Type 2 diabetes mellitus with hyperglycemia: Secondary | ICD-10-CM

## 2016-07-21 DIAGNOSIS — Z794 Long term (current) use of insulin: Secondary | ICD-10-CM

## 2016-07-21 DIAGNOSIS — E118 Type 2 diabetes mellitus with unspecified complications: Secondary | ICD-10-CM

## 2016-07-21 DIAGNOSIS — I2510 Atherosclerotic heart disease of native coronary artery without angina pectoris: Secondary | ICD-10-CM

## 2016-07-21 DIAGNOSIS — Z01810 Encounter for preprocedural cardiovascular examination: Secondary | ICD-10-CM

## 2016-07-21 LAB — SPIROMETRY WITH GRAPH
FEF 25-75 Post: 1.33 L/s
FEF 25-75 Pre: 1.22 L/s
FEF2575-%Change-Post: 9 %
FEF2575-%Pred-Post: 62 %
FEF2575-%Pred-Pre: 56 %
FEV1-%Change-Post: 2 %
FEV1-%Pred-Post: 80 %
FEV1-%Pred-Pre: 78 %
FEV1-Post: 1.85 L
FEV1-Pre: 1.8 L
FEV1FVC-%Change-Post: 5 %
FEV1FVC-%Pred-Pre: 92 %
FEV6-%Change-Post: -2 %
FEV6-%Pred-Post: 84 %
FEV6-%Pred-Pre: 86 %
FEV6-Post: 2.45 L
FEV6-Pre: 2.5 L
FEV6FVC-%Change-Post: 0 %
FEV6FVC-%Pred-Post: 104 %
FEV6FVC-%Pred-Pre: 103 %
FVC-%Change-Post: -2 %
FVC-%Pred-Post: 81 %
FVC-%Pred-Pre: 83 %
FVC-Post: 2.45 L
FVC-Pre: 2.51 L
Post FEV1/FVC ratio: 76 %
Post FEV6/FVC ratio: 100 %
Pre FEV1/FVC ratio: 72 %
Pre FEV6/FVC Ratio: 100 %

## 2016-07-21 LAB — POCT I-STAT 3, ART BLOOD GAS (G3+)
BICARBONATE: 24.5 mmol/L (ref 20.0–28.0)
O2 Saturation: 92 %
PCO2 ART: 36.3 mmHg (ref 32.0–48.0)
PO2 ART: 59 mmHg — AB (ref 83.0–108.0)
Patient temperature: 97.9
TCO2: 26 mmol/L (ref 0–100)
pH, Arterial: 7.436 (ref 7.350–7.450)

## 2016-07-21 LAB — COMPREHENSIVE METABOLIC PANEL
ALT: 28 U/L (ref 14–54)
AST: 68 U/L — ABNORMAL HIGH (ref 15–41)
Albumin: 3.9 g/dL (ref 3.5–5.0)
Alkaline Phosphatase: 55 U/L (ref 38–126)
Anion gap: 10 (ref 5–15)
BUN: 14 mg/dL (ref 6–20)
CO2: 26 mmol/L (ref 22–32)
Calcium: 9.7 mg/dL (ref 8.9–10.3)
Chloride: 103 mmol/L (ref 101–111)
Creatinine, Ser: 0.92 mg/dL (ref 0.44–1.00)
GFR calc Af Amer: >60 mL/min (ref >60)
GFR calc non Af Amer: >60 mL/min (ref >60)
Glucose, Bld: 125 mg/dL — ABNORMAL HIGH (ref 65–99)
Potassium: 3.8 mmol/L (ref 3.5–5.1)
Sodium: 139 mmol/L (ref 135–145)
Total Bilirubin: 1 mg/dL (ref 0.3–1.2)
Total Protein: 7 g/dL (ref 6.5–8.1)

## 2016-07-21 LAB — CBC
HEMATOCRIT: 38.4 % (ref 36.0–46.0)
HEMOGLOBIN: 13.2 g/dL (ref 12.0–15.0)
MCH: 30.4 pg (ref 26.0–34.0)
MCHC: 34.4 g/dL (ref 30.0–36.0)
MCV: 88.5 fL (ref 78.0–100.0)
Platelets: 186 10*3/uL (ref 150–400)
RBC: 4.34 MIL/uL (ref 3.87–5.11)
RDW: 13.2 % (ref 11.5–15.5)
WBC: 7.6 10*3/uL (ref 4.0–10.5)

## 2016-07-21 LAB — LIPID PANEL
CHOL/HDL RATIO: 6.5 ratio
CHOLESTEROL: 254 mg/dL — AB (ref 0–200)
HDL: 39 mg/dL — ABNORMAL LOW (ref >40)
LDL Cholesterol: 164 mg/dL — ABNORMAL HIGH (ref 0–99)
Triglycerides: 255 mg/dL — ABNORMAL HIGH (ref <150)
VLDL: 51 mg/dL — AB (ref 0–40)

## 2016-07-21 LAB — TROPONIN I
TROPONIN I: 7.49 ng/mL — AB (ref <0.03)
TROPONIN I: 6.39 ng/mL — AB (ref <0.03)
Troponin I: 3.89 ng/mL (ref <0.03)
Troponin I: 6.41 ng/mL (ref <0.03)

## 2016-07-21 LAB — VAS US DOPPLER PRE CABG
LEFT ECA DIAS: 10 cm/s
LEFT VERTEBRAL DIAS: -20 cm/s
Left CCA dist dias: -15 cm/s
Left CCA dist sys: -67 cm/s
Left CCA prox dias: 15 cm/s
Left CCA prox sys: 78 cm/s
Left ICA dist dias: -17 cm/s
Left ICA dist sys: -54 cm/s
Left ICA prox dias: -15 cm/s
Left ICA prox sys: -59 cm/s
RIGHT ECA DIAS: -8 cm/s
RIGHT VERTEBRAL DIAS: 10 cm/s
Right CCA prox dias: -14 cm/s
Right CCA prox sys: -81 cm/s
Right cca dist sys: -40 cm/s

## 2016-07-21 LAB — URINE CULTURE

## 2016-07-21 LAB — HEMOGLOBIN A1C
HEMOGLOBIN A1C: 7.1 % — AB (ref 4.8–5.6)
MEAN PLASMA GLUCOSE: 157 mg/dL

## 2016-07-21 LAB — GLUCOSE, CAPILLARY
GLUCOSE-CAPILLARY: 192 mg/dL — AB (ref 65–99)
Glucose-Capillary: 260 mg/dL — ABNORMAL HIGH (ref 65–99)
Glucose-Capillary: 188 mg/dL — ABNORMAL HIGH (ref 65–99)
Glucose-Capillary: 137 mg/dL — ABNORMAL HIGH (ref 65–99)

## 2016-07-21 LAB — ABO/RH: ABO/RH(D): O POS

## 2016-07-21 LAB — HEPARIN LEVEL (UNFRACTIONATED)
HEPARIN UNFRACTIONATED: 0.16 [IU]/mL — AB (ref 0.30–0.70)
HEPARIN UNFRACTIONATED: 0.2 [IU]/mL — AB (ref 0.30–0.70)

## 2016-07-21 LAB — PREPARE RBC (CROSSMATCH)

## 2016-07-21 LAB — LACTIC ACID, PLASMA: Lactic Acid, Venous: 1.6 mmol/L (ref 0.5–1.9)

## 2016-07-21 MED ORDER — DIAZEPAM 5 MG PO TABS
5.0000 mg | ORAL_TABLET | Freq: Once | ORAL | Status: AC
  Administered 2016-07-22: 5 mg via ORAL
  Filled 2016-07-21: qty 1

## 2016-07-21 MED ORDER — BISACODYL 5 MG PO TBEC
5.0000 mg | DELAYED_RELEASE_TABLET | Freq: Once | ORAL | Status: AC
  Administered 2016-07-21: 5 mg via ORAL
  Filled 2016-07-21: qty 1

## 2016-07-21 MED ORDER — PHENYLEPHRINE HCL 10 MG/ML IJ SOLN
30.0000 ug/min | INTRAVENOUS | Status: DC
  Filled 2016-07-21: qty 2

## 2016-07-21 MED ORDER — VANCOMYCIN HCL 10 G IV SOLR
1500.0000 mg | INTRAVENOUS | Status: AC
  Administered 2016-07-22: 1500 mg via INTRAVENOUS
  Filled 2016-07-21: qty 1500

## 2016-07-21 MED ORDER — SODIUM CHLORIDE 0.9 % IV SOLN
INTRAVENOUS | Status: DC
  Filled 2016-07-21: qty 2.5

## 2016-07-21 MED ORDER — NITROGLYCERIN IN D5W 200-5 MCG/ML-% IV SOLN
0.0000 ug/min | INTRAVENOUS | Status: DC
  Administered 2016-07-22: 40 ug/min via INTRAVENOUS
  Administered 2016-07-22: 20 ug/min via INTRAVENOUS
  Filled 2016-07-21: qty 250

## 2016-07-21 MED ORDER — ALBUTEROL SULFATE (2.5 MG/3ML) 0.083% IN NEBU
2.5000 mg | INHALATION_SOLUTION | Freq: Once | RESPIRATORY_TRACT | Status: AC
  Administered 2016-07-21: 2.5 mg via RESPIRATORY_TRACT

## 2016-07-21 MED ORDER — TRANEXAMIC ACID (OHS) PUMP PRIME SOLUTION
2.0000 mg/kg | INTRAVENOUS | Status: DC
  Filled 2016-07-21: qty 2.17

## 2016-07-21 MED ORDER — CHLORHEXIDINE GLUCONATE 4 % EX LIQD
60.0000 mL | Freq: Once | CUTANEOUS | Status: AC
  Administered 2016-07-22: 4 via TOPICAL
  Filled 2016-07-21: qty 60

## 2016-07-21 MED ORDER — EPINEPHRINE PF 1 MG/ML IJ SOLN
0.0000 ug/min | INTRAMUSCULAR | Status: DC
  Filled 2016-07-21: qty 4

## 2016-07-21 MED ORDER — TRANEXAMIC ACID (OHS) BOLUS VIA INFUSION
15.0000 mg/kg | INTRAVENOUS | Status: AC
  Administered 2016-07-22: 1626 mg via INTRAVENOUS
  Filled 2016-07-21: qty 1626

## 2016-07-21 MED ORDER — NITROGLYCERIN IN D5W 200-5 MCG/ML-% IV SOLN
2.0000 ug/min | INTRAVENOUS | Status: AC
  Administered 2016-07-22: 20 ug/min via INTRAVENOUS
  Filled 2016-07-21: qty 250

## 2016-07-21 MED ORDER — POTASSIUM CHLORIDE 2 MEQ/ML IV SOLN
80.0000 meq | INTRAVENOUS | Status: DC
  Filled 2016-07-21: qty 40

## 2016-07-21 MED ORDER — METOPROLOL TARTRATE 12.5 MG HALF TABLET
12.5000 mg | ORAL_TABLET | Freq: Once | ORAL | Status: AC
  Administered 2016-07-22: 12.5 mg via ORAL
  Filled 2016-07-21: qty 1

## 2016-07-21 MED ORDER — TRANEXAMIC ACID 1000 MG/10ML IV SOLN
1.5000 mg/kg/h | INTRAVENOUS | Status: AC
  Administered 2016-07-22: 1.5 mg/kg/h via INTRAVENOUS
  Filled 2016-07-21: qty 25

## 2016-07-21 MED ORDER — INSULIN GLARGINE 100 UNIT/ML ~~LOC~~ SOLN
10.0000 [IU] | Freq: Two times a day (BID) | SUBCUTANEOUS | Status: DC
  Administered 2016-07-21 (×2): 10 [IU] via SUBCUTANEOUS
  Filled 2016-07-21 (×4): qty 0.1

## 2016-07-21 MED ORDER — CHLORHEXIDINE GLUCONATE 4 % EX LIQD
60.0000 mL | Freq: Once | CUTANEOUS | Status: AC
  Administered 2016-07-21: 4 via TOPICAL
  Filled 2016-07-21: qty 60

## 2016-07-21 MED ORDER — MAGNESIUM SULFATE 50 % IJ SOLN
40.0000 meq | INTRAMUSCULAR | Status: DC
  Filled 2016-07-21: qty 10

## 2016-07-21 MED ORDER — ALPRAZOLAM 0.25 MG PO TABS: 0.2500 mg | ORAL_TABLET | ORAL | Status: DC | PRN

## 2016-07-21 MED ORDER — CHLORHEXIDINE GLUCONATE 0.12 % MT SOLN
15.0000 mL | Freq: Once | OROMUCOSAL | Status: AC
  Administered 2016-07-22: 15 mL via OROMUCOSAL
  Filled 2016-07-21: qty 15

## 2016-07-21 MED ORDER — LEVOFLOXACIN IN D5W 500 MG/100ML IV SOLN
500.0000 mg | INTRAVENOUS | Status: AC
  Administered 2016-07-22: 500 mg via INTRAVENOUS
  Filled 2016-07-21 (×2): qty 100

## 2016-07-21 MED ORDER — DOPAMINE-DEXTROSE 3.2-5 MG/ML-% IV SOLN
0.0000 ug/kg/min | INTRAVENOUS | Status: DC
  Filled 2016-07-21: qty 250

## 2016-07-21 MED ORDER — PLASMA-LYTE 148 IV SOLN
INTRAVENOUS | Status: AC
  Administered 2016-07-22: 15:00:00
  Filled 2016-07-21 (×2): qty 2.5

## 2016-07-21 MED ORDER — TEMAZEPAM 15 MG PO CAPS: 15.0000 mg | ORAL_CAPSULE | Freq: Once | ORAL | Status: DC | PRN

## 2016-07-21 MED ORDER — DEXMEDETOMIDINE HCL IN NACL 400 MCG/100ML IV SOLN
0.1000 ug/kg/h | INTRAVENOUS | Status: DC
  Filled 2016-07-21: qty 100

## 2016-07-21 MED ORDER — SODIUM CHLORIDE 0.9 % IV SOLN
INTRAVENOUS | Status: DC
  Filled 2016-07-21: qty 30

## 2016-07-21 NOTE — Progress Notes (Signed)
[place] for heparin  Indication: chest pain/ACS  Allergies  Allergen Reactions  . Adhesive [Tape] Other (See Comments)    SKIN BLISTERS  . Penicillins     Swelling   . Red Yeast Rice Hives  . Sulfa Drugs Cross Reactors Hives    Patient Measurements: Heparin dosing wt: 76.4kg  Vital Signs: Temp: 98.8 F (37.1 C) (10/11 0400) Temp Source: Oral (10/11 0400) BP: 168/105 (10/11 0800) Pulse Rate: 72 (10/11 0800)  Labs:  Recent Labs  07/20/16 0956  07/20/16 1407 07/20/16 1740 07/20/16 2324 07/21/16 0420 07/21/16 0857  HGB  --   < > 14.2 13.0  --  13.2  --   HCT  --   < > 40.6 37.5  --  38.4  --   PLT  --   --  212 205  --  186  --   HEPARINUNFRC  --   --   --   --   --   --  0.16*  CREATININE 0.92  --  0.96  --   --  0.92  --   TROPONINI  --   --   --  1.18* 7.49* 6.39*  --   < > = values in this interval not displayed.  Estimated Creatinine Clearance: 73.5 mL/min (by C-G formula based on SCr of 0.92 mg/dL).   Medical History: Past Medical History:  Diagnosis Date  . Allergy   . Anal fissure   . Anxiety   . Arthritis   . Asthma   . Cataract 2010    left  . Colon polyps    diverticulosis  03-2011/2005  . Depression   . Diabetes mellitus 2012   T2DM  . Diverticulosis   . Fatty liver 04/2011  . GERD (gastroesophageal reflux disease)   . Hyperlipidemia   . Hypertension severe   . Migraine   . Obesity   . OSA (obstructive sleep apnea)    C-Pap  . Pacemaker Medtronic   . Sinoatrial node dysfunction (HCC)    syncope    Medications:  Prescriptions Prior to Admission  Medication Sig Dispense Refill Last Dose  . albuterol (PROAIR HFA) 108 (90 Base) MCG/ACT inhaler Inhale 2 puffs into the lungs as needed. 2 Inhaler 3 07/20/2016 at Unknown time  . cetirizine (ZYRTEC) 10 MG tablet Take 10 mg by mouth daily.     07/20/2016 at Unknown time  . Cholecalciferol (VITAMIN D) 2000 UNITS CAPS Take by mouth.     07/20/2016 at  Unknown time  . citalopram (CELEXA) 20 MG tablet TAKE 1 BY MOUTH DAILY 90 tablet 2 07/20/2016 at Unknown time  . dicyclomine (BENTYL) 20 MG tablet Take 20 mg by mouth 3 (three) times daily.   07/20/2016 at Unknown time  . fish oil-omega-3 fatty acids 1000 MG capsule Take 2 g by mouth daily. PER PATIENT TAKE 8000 MG DAILY   07/20/2016 at Unknown time  . fluticasone (FLONASE) 50 MCG/ACT nasal spray Place 2 sprays into both nostrils daily. 48 g 3 07/20/2016 at Unknown time  . fluticasone (FLOVENT HFA) 110 MCG/ACT inhaler Inhale 2 puffs into the lungs 2 (two) times daily. 1 Inhaler 12 07/20/2016 at Unknown time  . gabapentin (NEURONTIN) 300 MG capsule TAKE 1 BY MOUTH IN THE MORNING AND 2 AT BEDTIME 270 capsule 2 07/20/2016 at Unknown time  . glimepiride (AMARYL) 4 MG tablet Take 1/2 tablet twice a day before main meals. 90 tablet 1 07/20/2016 at Unknown time  .  hydrOXYzine (ATARAX/VISTARIL) 25 MG tablet Take 1 tablet (25 mg total) by mouth every 8 (eight) hours as needed. (Patient taking differently: Take 25 mg by mouth every 8 (eight) hours as needed for anxiety. ) 30 tablet 1 07/20/2016 at Unknown time  . lisinopril-hydrochlorothiazide (PRINZIDE,ZESTORETIC) 20-12.5 MG tablet Take 1 tablet by mouth 2 (two) times daily. 180 tablet 2 07/20/2016 at Unknown time  . losartan (COZAAR) 100 MG tablet Take 1 tablet (100 mg total) by mouth daily. 90 tablet 2 07/20/2016 at Unknown time  . metoprolol succinate (TOPROL-XL) 50 MG 24 hr tablet Take 2 tablets (100 mg total) by mouth daily. Take with or immediately following a meal. 180 tablet 2 07/20/2016 at 0800  . Multiple Vitamin (MULTIVITAMIN) tablet Take 1 tablet by mouth daily.     07/20/2016 at Unknown time  . omeprazole (PRILOSEC) 20 MG capsule TAKE 1 BY MOUTH DAILY 90 capsule 2 07/20/2016 at Unknown time  . ondansetron (ZOFRAN-ODT) 8 MG disintegrating tablet DISSOLVE 1 ON TONGUE EVERY 12 HOURS AS NEEDED FOR NAUSEA 30 tablet 3 Past Week at Unknown time  .  Thiamine Mononitrate (VITAMIN B1 PO) Take 1 tablet by mouth daily.   07/20/2016 at Unknown time  . traMADol (ULTRAM) 50 MG tablet Take 1 tablet (50 mg total) by mouth every 8 (eight) hours as needed. (Patient taking differently: Take 50 mg by mouth every 6 (six) hours as needed for moderate pain. ) 30 tablet 2 07/19/2016 at Unknown time  . triamcinolone cream (KENALOG) 0.1 % Apply 1 application topically as needed. 30 g 2 Past Week at Unknown time  . glucose blood test strip Use as instructed 200 each 5 Taking   Scheduled:  . aspirin EC  325 mg Oral Daily  . atorvastatin  80 mg Oral q1800  . bisacodyl  5 mg Oral Once  . chlorhexidine  60 mL Topical Once   And  . [START ON 07/22/2016] chlorhexidine  60 mL Topical Once  . [START ON 07/22/2016] chlorhexidine  15 mL Mouth/Throat Once  . citalopram  20 mg Oral Daily  . [START ON 07/22/2016] dexmedetomidine  0.1-0.7 mcg/kg/hr Intravenous To OR  . [START ON 07/22/2016] diazepam  5 mg Oral Once  . dicyclomine  20 mg Oral TID  . [START ON 07/22/2016] DOPamine  0-10 mcg/kg/min Intravenous To OR  . [START ON 07/22/2016] epinephrine  0-10 mcg/min Intravenous To OR  . fluticasone  2 spray Each Nare Daily  . gabapentin  300 mg Oral q morning - 10a  . gabapentin  600 mg Oral QHS  . glimepiride  2 mg Oral Q breakfast  . [START ON 07/22/2016] heparin-papaverine-plasmalyte irrigation   Irrigation To OR  . [START ON 07/22/2016] heparin 30,000 units/NS 1000 mL solution for CELLSAVER   Other To OR  . insulin aspart  0-20 Units Subcutaneous TID WC  . insulin glargine  10 Units Subcutaneous BID  . [START ON 07/22/2016] insulin (NOVOLIN-R) infusion   Intravenous To OR  . [START ON 07/22/2016] levofloxacin (LEVAQUIN) IV  500 mg Intravenous To OR  . loratadine  10 mg Oral Daily  . [START ON 07/22/2016] magnesium sulfate  40 mEq Other To OR  . metoprolol succinate  100 mg Oral Daily  . [START ON 07/22/2016] metoprolol tartrate  12.5 mg Oral Once  . [START ON  07/22/2016] nitroGLYCERIN  2-200 mcg/min Intravenous To OR  . pantoprazole  40 mg Oral Daily  . [START ON 07/22/2016] phenylephrine (NEO-SYNEPHRINE) Adult infusion  30-200 mcg/min Intravenous  To OR  . [START ON 07/22/2016] potassium chloride  80 mEq Other To OR  . sodium chloride flush  3 mL Intravenous Q12H  . [START ON 07/22/2016] tranexamic acid (CYKLOKAPRON) infusion ([facility])  1.5 mg/kg/hr Intravenous To OR  . [START ON 07/22/2016] tranexamic acid  15 mg/kg Intravenous To OR  . [START ON 07/22/2016] tranexamic acid  2 mg/kg Intracatheter To OR  . 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 Pharmacy consulted to dose heparin.  Initial heparin level low (0.16) on 1050 units/h. CBC wnl, no bleed or IV line issues per RN.  Goal of Therapy:  Heparin level 0.3-0.7 units/ml Monitor platelets by anticoagulation protocol: Yes   Plan:  -Increase heparin to 1300 units/h -Heparin level in 6 hours and daily wth CBC daily -Monitor for s/sx bleeding -CABG for tomorrow  Elicia Lamp, PharmD, BCPS Clinical Pharmacist 07/21/2016 9:58 AM

## 2016-07-21 NOTE — Progress Notes (Signed)
[place] for heparin  Indication: chest pain/ACS  / CABG 10/12  Allergies  Allergen Reactions  . Adhesive [Tape] Other (See Comments)    SKIN BLISTERS  . Penicillins     Swelling   . Red Yeast Rice Hives  . Sulfa Drugs Cross Reactors Hives    Patient Measurements: Heparin dosing wt: 76.4kg  Vital Signs: Temp: 97.9 F (36.6 C) (10/11 1550) Temp Source: Oral (10/11 1550) BP: 108/60 (10/11 1600) Pulse Rate: 85 (10/11 1600)  Labs:  Recent Labs  07/20/16 0956  07/20/16 1407 07/20/16 1740  07/21/16 0420 07/21/16 0857 07/21/16 1032 07/21/16 1649  HGB  --   < > 14.2 13.0  --  13.2  --   --   --   HCT  --   < > 40.6 37.5  --  38.4  --   --   --   PLT  --   --  212 205  --  186  --   --   --   HEPARINUNFRC  --   --   --   --   --   --  0.16*  --  0.20*  CREATININE 0.92  --  0.96  --   --  0.92  --   --   --   TROPONINI  --   --   --  1.18*  < > 6.39*  --  3.89* 6.41*  < > = values in this interval not displayed.  Estimated Creatinine Clearance: 73.5 mL/min (by C-G formula based on SCr of 0.92 mg/dL).   Medical History: Past Medical History:  Diagnosis Date  . Allergy   . Anal fissure   . Anxiety   . Arthritis   . Asthma   . Cataract 2010    left  . Colon polyps    diverticulosis  03-2011/2005  . Depression   . Diabetes mellitus 2012   T2DM  . Diverticulosis   . Fatty liver 04/2011  . GERD (gastroesophageal reflux disease)   . Hyperlipidemia   . Hypertension severe   . Migraine   . Obesity   . OSA (obstructive sleep apnea)    C-Pap  . Pacemaker Medtronic   . Sinoatrial node dysfunction (HCC)    syncope    Medications:  Prescriptions Prior to Admission  Medication Sig Dispense Refill Last Dose  . albuterol (PROAIR HFA) 108 (90 Base) MCG/ACT inhaler Inhale 2 puffs into the lungs as needed. 2 Inhaler 3 07/20/2016 at Unknown time  . cetirizine (ZYRTEC) 10 MG tablet Take 10 mg by mouth daily.     07/20/2016 at Unknown  time  . Cholecalciferol (VITAMIN D) 2000 UNITS CAPS Take by mouth.     07/20/2016 at Unknown time  . citalopram (CELEXA) 20 MG tablet TAKE 1 BY MOUTH DAILY 90 tablet 2 07/20/2016 at Unknown time  . dicyclomine (BENTYL) 20 MG tablet Take 20 mg by mouth 3 (three) times daily.   07/20/2016 at Unknown time  . fish oil-omega-3 fatty acids 1000 MG capsule Take 2 g by mouth daily. PER PATIENT TAKE 8000 MG DAILY   07/20/2016 at Unknown time  . fluticasone (FLONASE) 50 MCG/ACT nasal spray Place 2 sprays into both nostrils daily. 48 g 3 07/20/2016 at Unknown time  . fluticasone (FLOVENT HFA) 110 MCG/ACT inhaler Inhale 2 puffs into the lungs 2 (two) times daily. 1 Inhaler 12 07/20/2016 at Unknown time  . gabapentin (NEURONTIN) 300 MG capsule TAKE 1 BY MOUTH  IN THE MORNING AND 2 AT BEDTIME 270 capsule 2 07/20/2016 at Unknown time  . glimepiride (AMARYL) 4 MG tablet Take 1/2 tablet twice a day before main meals. 90 tablet 1 07/20/2016 at Unknown time  . hydrOXYzine (ATARAX/VISTARIL) 25 MG tablet Take 1 tablet (25 mg total) by mouth every 8 (eight) hours as needed. (Patient taking differently: Take 25 mg by mouth every 8 (eight) hours as needed for anxiety. ) 30 tablet 1 07/20/2016 at Unknown time  . lisinopril-hydrochlorothiazide (PRINZIDE,ZESTORETIC) 20-12.5 MG tablet Take 1 tablet by mouth 2 (two) times daily. 180 tablet 2 07/20/2016 at Unknown time  . losartan (COZAAR) 100 MG tablet Take 1 tablet (100 mg total) by mouth daily. 90 tablet 2 07/20/2016 at Unknown time  . metoprolol succinate (TOPROL-XL) 50 MG 24 hr tablet Take 2 tablets (100 mg total) by mouth daily. Take with or immediately following a meal. 180 tablet 2 07/20/2016 at 0800  . Multiple Vitamin (MULTIVITAMIN) tablet Take 1 tablet by mouth daily.     07/20/2016 at Unknown time  . omeprazole (PRILOSEC) 20 MG capsule TAKE 1 BY MOUTH DAILY 90 capsule 2 07/20/2016 at Unknown time  . ondansetron (ZOFRAN-ODT) 8 MG disintegrating tablet DISSOLVE 1 ON  TONGUE EVERY 12 HOURS AS NEEDED FOR NAUSEA 30 tablet 3 Past Week at Unknown time  . Thiamine Mononitrate (VITAMIN B1 PO) Take 1 tablet by mouth daily.   07/20/2016 at Unknown time  . traMADol (ULTRAM) 50 MG tablet Take 1 tablet (50 mg total) by mouth every 8 (eight) hours as needed. (Patient taking differently: Take 50 mg by mouth every 6 (six) hours as needed for moderate pain. ) 30 tablet 2 07/19/2016 at Unknown time  . triamcinolone cream (KENALOG) 0.1 % Apply 1 application topically as needed. 30 g 2 Past Week at Unknown time  . glucose blood test strip Use as instructed 200 each 5 Taking   Scheduled:  . aspirin EC  325 mg Oral Daily  . atorvastatin  80 mg Oral q1800  . chlorhexidine  60 mL Topical Once   And  . [START ON 07/22/2016] chlorhexidine  60 mL Topical Once  . [START ON 07/22/2016] chlorhexidine  15 mL Mouth/Throat Once  . citalopram  20 mg Oral Daily  . [START ON 07/22/2016] dexmedetomidine  0.1-0.7 mcg/kg/hr Intravenous To OR  . [START ON 07/22/2016] diazepam  5 mg Oral Once  . dicyclomine  20 mg Oral TID  . [START ON 07/22/2016] DOPamine  0-10 mcg/kg/min Intravenous To OR  . [START ON 07/22/2016] epinephrine  0-10 mcg/min Intravenous To OR  . fluticasone  2 spray Each Nare Daily  . gabapentin  300 mg Oral q morning - 10a  . gabapentin  600 mg Oral QHS  . glimepiride  2 mg Oral Q breakfast  . [START ON 07/22/2016] heparin-papaverine-plasmalyte irrigation   Irrigation To OR  . [START ON 07/22/2016] heparin 30,000 units/NS 1000 mL solution for CELLSAVER   Other To OR  . insulin aspart  0-20 Units Subcutaneous TID WC  . insulin glargine  10 Units Subcutaneous BID  . [START ON 07/22/2016] insulin (NOVOLIN-R) infusion   Intravenous To OR  . [START ON 07/22/2016] levofloxacin (LEVAQUIN) IV  500 mg Intravenous To OR  . loratadine  10 mg Oral Daily  . [START ON 07/22/2016] magnesium sulfate  40 mEq Other To OR  . metoprolol succinate  100 mg Oral Daily  . [START ON 07/22/2016]  metoprolol tartrate  12.5 mg Oral Once  . [  START ON 07/22/2016] nitroGLYCERIN  2-200 mcg/min Intravenous To OR  . pantoprazole  40 mg Oral Daily  . [START ON 07/22/2016] phenylephrine (NEO-SYNEPHRINE) Adult infusion  30-200 mcg/min Intravenous To OR  . [START ON 07/22/2016] potassium chloride  80 mEq Other To OR  . sodium chloride flush  3 mL Intravenous Q12H  . [START ON 07/22/2016] tranexamic acid (CYKLOKAPRON) infusion ([facility])  1.5 mg/kg/hr Intravenous To OR  . [START ON 07/22/2016] tranexamic acid  15 mg/kg Intravenous To OR  . [START ON 07/22/2016] tranexamic acid  2 mg/kg Intracatheter To OR  . 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 Pharmacy consulted to dose heparin.  PM heparin level = 0.2  Goal of Therapy:  Heparin level 0.3-0.7 units/ml Monitor platelets by anticoagulation protocol: Yes   Plan:  -Increase heparin to 1600 units/h -CABG for tomorrow  Thank you Anette Guarneri, PharmD [phone] 07/21/2016 5:45 PM

## 2016-07-21 NOTE — Assessment & Plan Note (Addendum)
Blood pressure remains elevated.  On Toprol 100 mg daily and restarting nitroglycerin drip. Need to determine whether or not she was on ARB versus ACE inhibitor and restart home medication. - Apparently she does take both losartan 100 mg and lisinopril 20 g - > will use ACE inhibitor. May require calcium channel blocker.

## 2016-07-21 NOTE — Progress Notes (Unsigned)
[facility]

## 2016-07-21 NOTE — Progress Notes (Signed)
RN to bedside for hourly rounding. Pt OOB to BR. Pt appears SOB, diaphoretic,and c/o CP 2/10. Pt returned to bed with CP now 5/10. Nitro gtt titrated up. O2 at 2L/m initiated. Pt's CP peaked at 9/10. Nitro gtt titrated to 21mcg/hr, and 2mg  morphine IV given. Pt VSS except for BP at 175/86 (108). Pt now CP free. Instructed pt to stay in bed. Pt verbalized understanding.  Jettie Booze NP notified. No new orders given. Will continue to monitor and assess pt closely.

## 2016-07-21 NOTE — Assessment & Plan Note (Signed)
ST segments seemed to resolved. Occluded circumflex noted with collaterals from right to left. Plan:   Heparin infusion.   Restart nitroglycerin drip, as she has been having chest pain.  CT surgical consultation for CABG later this week - Dr. Tharon Aquas Trigt  High-dose statin, aspirin, high-dose beta blocker

## 2016-07-21 NOTE — Assessment & Plan Note (Addendum)
Chronic diastolic heart failure now exacerbated with MI. EDP was of a 28 mmHg.  We'll need to add afterload reduction - was on both ACE inhibitor and ARB at home. Will start ACE inhibitor now. Start low-dose Lasix

## 2016-07-21 NOTE — Assessment & Plan Note (Addendum)
ST segments seemed to resolved. Occluded circumflex noted with collaterals from right to left. Plan:2nd Case CABG today  - Dr. Tharon Aquas Trigt  Heparin infusion.   Nitroglycerin drip, as she has been having chest pain - but will try to wean down to allow BP to give BB dose.  High-dose statin, aspirin,   Was on Toprol 100 mg daily, but Lopressor 12.5 mg BID ordered by CT Sgx. -- will reduce Toprol dose to 50 mg for today given low BP today.  Holding ACE-I

## 2016-07-21 NOTE — Consult Note (Signed)
[address]       [place], [address]             [phone]        Sinia Huffine Sawyer Medical Record #[id number] Date of Birth: [birth year]  Referring: No ref. provider found Primary Care: Pcp Not In System  Chief Complaint:    Chief Complaint  Patient presents with  . Chest Pain  Patient examined, coronary angiograms personally reviewed and discussed with patient and family.  History of Present Illness:     60 year old type II diabetic nonsmoker presented with unstable anginal symptoms and positive cardiac enzymes. Urgent cardiac catheterization by Dr. 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 LV systolic function was fairly well preserved, LVEDP 26 mmHg. On presentation her lactic acid was elevated but that has returned to normal. Her troponins have peak and now are regressing. She had 1 episode of chest pain on IV heparin last night. She is planned for surgical coronary revascularization which was recommended by her cardiologist --I agree with that recommendation. Patient had a cardiac catheterization in the past which apparently was negative. Cardiac catheterization via right radial artery was performed without hematoma or bleeding the patient has had a previous dual-chamber pacemaker placed for slow heart rate. . Current Activity/ Functional Status: Patient is employed in  [employer].   Zubrod Score: At the time of surgery this patient's most appropriate activity status/level should be described as: _0     0    Normal activity, no symptoms _1     1    Restricted in physical strenuous activity but ambulatory, able to do out light work _2     2    Ambulatory and capable of self care, unable to do work activities, up and about                 more than 50%  Of the time                            _3     3    Only limited self care, in bed greater than 50% of waking hours _4     4    Completely disabled, no self  care, confined to bed or chair _5     5    Moribund  Past Medical History:  Diagnosis Date  . Allergy   . Anal fissure   . Anxiety   . Arthritis   . Asthma   . Cataract 2010    left  . Colon polyps    diverticulosis  03-2011/2005  . Depression   . Diabetes mellitus 2012   T2DM  . Diverticulosis   . Fatty liver 04/2011  . GERD (gastroesophageal reflux disease)   . Hyperlipidemia   . Hypertension severe   . Migraine   . Obesity   . OSA (obstructive sleep apnea)    C-Pap  . Pacemaker Medtronic   . Sinoatrial node dysfunction (HCC)    syncope    Past Surgical History:  Procedure Laterality Date  . ABDOMINAL HYSTERECTOMY  2000   total  . CARDIAC CATHETERIZATION N/A 07/20/2016   Procedure: Left Heart Cath and Coronary Angiography;  Surgeon: Peter M Martinique, MD;  Location: [facility];  Service: Cardiovascular;  Laterality: N/A;  . EYE SURGERY Right 2010  . OOPHORECTOMY    . PACEMAKER PLACEMENT    . T/A right knee    .  TONSILLECTOMY      History  Smoking Status  . Former Smoker  . Types: Cigarettes  . Quit date: 10/12/1991  Smokeless Tobacco  . Never Used    Comment: Quit 20 yrs ago    History  Alcohol Use No    Social History   Social History  . Marital status: Single    Spouse name: N/A  . Number of children: 0  . Years of education: N/A   Occupational History  . [employer]   Social History Main Topics  . Smoking status: Former Smoker    Types: Cigarettes    Quit date: 10/12/1991  . Smokeless tobacco: Never Used     Comment: Quit 20 yrs ago  . Alcohol use No  . Drug use: No  . Sexual activity: Yes    Birth control/ protection: Post-menopausal, Other-see comments     Comment: Monogamous    Other Topics Concern  . Not on file   Social History Narrative   Single. Exercise: No. Education: College.    Allergies  Allergen Reactions  . Adhesive [Tape] Other (See Comments)    SKIN BLISTERS  . Penicillins     Swelling   .  Red Yeast Rice Hives  . Sulfa Drugs Cross Reactors Hives    Current Facility-Administered Medications  Medication Dose Route Frequency Provider Last Rate Last Dose  . 0.9 %  sodium chloride infusion  250 mL Intravenous PRN Peter M Martinique, MD 10 mL/hr at 07/21/16 0700 250 mL at 07/21/16 0700  . acetaminophen (TYLENOL) tablet 650 mg  650 mg Oral Q4H PRN Peter M Martinique, MD   650 mg at 07/21/16 1207  . albuterol (PROVENTIL) (2.5 MG/3ML) 0.083% nebulizer solution 3 mL  3 mL Inhalation PRN Erma Heritage, PA      . ALPRAZolam Duanne Moron) tablet 0.25-0.5 mg  0.25-0.5 mg Oral Q4H PRN Ivin Poot, MD      . aspirin EC tablet 325 mg  325 mg Oral Daily Peter M Martinique, MD   325 mg at 07/21/16 1007  . atorvastatin (LIPITOR) tablet 80 mg  80 mg Oral q1800 Peter M Martinique, MD      . chlorhexidine (HIBICLENS) 4 % liquid 4 application  60 mL Topical Once Ivin Poot, MD       And  . Derrill Memo ON 07/22/2016] chlorhexidine (HIBICLENS) 4 % liquid 4 application  60 mL Topical Once Ivin Poot, MD      . Derrill Memo ON 07/22/2016] chlorhexidine (PERIDEX) 0.12 % solution 15 mL  15 mL Mouth/Throat Once Ivin Poot, MD      . citalopram (CELEXA) tablet 20 mg  20 mg Oral Daily Erma Heritage, [place]   20 mg at 07/21/16 1008  . [START ON 07/22/2016] dexmedetomidine (PRECEDEX) 400 MCG/100ML (4 mcg/mL) infusion  0.1-0.7 mcg/kg/hr Intravenous To OR Ivin Poot, MD      . Derrill Memo ON 07/22/2016] diazepam (VALIUM) tablet 5 mg  5 mg Oral Once Ivin Poot, MD      . dicyclomine (BENTYL) tablet 20 mg  20 mg Oral TID Erma Heritage, PA   20 mg at 07/21/16 1009  . [START ON 07/22/2016] DOPamine (INTROPIN) 800 mg in dextrose 5 % 250 mL (3.2 mg/mL) infusion  0-10 mcg/kg/min Intravenous To OR Ivin Poot, MD      . Derrill Memo ON 07/22/2016] EPINEPHrine (ADRENALIN) 4 mg in dextrose 5 % 250 mL (0.016 mg/mL) infusion  0-10 mcg/min  Intravenous To OR Ivin Poot, MD      . fluticasone [facility]) 50 MCG/ACT nasal spray  2 spray  2 spray Each Nare Daily Erma Heritage, [place]   2 spray at 07/21/16 1012  . gabapentin (NEURONTIN) capsule 300 mg  300 mg Oral q morning - 10a Erma Heritage, PA   300 mg at 07/21/16 1008  . gabapentin (NEURONTIN) capsule 600 mg  600 mg Oral QHS Peter M Martinique, MD   600 mg at 07/20/16 2130  . glimepiride (AMARYL) tablet 2 mg  2 mg Oral Q breakfast Erma Heritage, [place]   2 mg at 07/21/16 0848  . [START ON 07/22/2016] heparin 2,500 Units, papaverine 30 mg in electrolyte-148 (PLASMALYTE-148) 500 mL irrigation   Irrigation To OR Ivin Poot, MD      . Derrill Memo ON 07/22/2016] heparin 30,000 units/NS 1000 mL solution for CELLSAVER   Other To OR Ivin Poot, MD      . heparin ADULT infusion 100 units/mL (25000 units/247m sodium chloride 0.45%)  1,300 Units/hr Intravenous Continuous HRomona Curls RPH 13 mL/hr at 07/21/16 1004 1,300 Units/hr at 07/21/16 1004  . insulin aspart (novoLOG) injection 0-20 Units  0-20 Units Subcutaneous TID WC Peter M JMartinique MD   11 Units at 07/21/16 1207  . insulin glargine (LANTUS) injection 10 Units  10 Units Subcutaneous BID PIvin Poot MD   10 Units at 07/21/16 1130  . [START ON 07/22/2016] insulin regular (NOVOLIN R,HUMULIN R) 250 Units in sodium chloride 0.9 % 250 mL (1 Units/mL) infusion   Intravenous To OR PIvin Poot MD      . [Derrill MemoON 07/22/2016] levofloxacin (LEVAQUIN) IVPB 500 mg  500 mg Intravenous To OR PIvin Poot MD      . loratadine (CLARITIN) tablet 10 mg  10 mg Oral Daily BErma Heritage PU[place]  10 mg at 07/21/16 1008  . [START ON 07/22/2016] magnesium sulfate (IV Push/IM) injection 40 mEq  40 mEq Other To OR PIvin Poot MD      . metoprolol succinate (TOPROL-XL) 24 hr tablet 100 mg  100 mg Oral Daily BErma Heritage PU[place]  100 mg at 07/21/16 1008  . [START ON 07/22/2016] metoprolol tartrate (LOPRESSOR) tablet 12.5 mg  12.5 mg Oral Once PIvin Poot MD      . morphine 2 MG/ML injection 2 mg  2 mg Intravenous  Q1H PRN Peter M JMartinique MD   2 mg at 07/21/16 1052  . nitroGLYCERIN (NITROSTAT) SL tablet 0.4 mg  0.4 mg Sublingual Q5 min PRN Courteney Lyn Mackuen, MD   0.4 mg at 07/21/16 0600  . nitroGLYCERIN 50 mg in dextrose 5 % 250 mL (0.2 mg/mL) infusion  0-200 mcg/min Intravenous Titrated DLeonie Man MD 12 mL/hr at 07/21/16 1200 40 mcg/min at 07/21/16 1200  . [START ON 07/22/2016] nitroGLYCERIN 50 mg in dextrose 5 % 250 mL (0.2 mg/mL) infusion  2-200 mcg/min Intravenous To OR PIvin Poot MD      . ondansetron ([facility] injection 4 mg  4 mg Intravenous Q6H PRN Peter M JMartinique MD      . pantoprazole (PROTONIX) EC tablet 40 mg  40 mg Oral Daily BErma Heritage PU[place]  40 mg at 07/21/16 1008  . [START ON 07/22/2016] phenylephrine (NEO-SYNEPHRINE) 20 mg in dextrose 5 % 250 mL (0.08 mg/mL) infusion  30-200 mcg/min Intravenous To OR PIvin Poot MD      . [  START ON 07/22/2016] potassium chloride injection 80 mEq  80 mEq Other To OR Ivin Poot, MD      . sodium chloride flush (NS) 0.9 % injection 3 mL  3 mL Intravenous Q12H Peter M Martinique, MD   3 mL at 07/21/16 1045  . sodium chloride flush (NS) 0.9 % injection 3 mL  3 mL Intravenous PRN Peter M Martinique, MD      . temazepam (RESTORIL) capsule 15 mg  15 mg Oral Once PRN Ivin Poot, MD      . Derrill Memo ON 07/22/2016] tranexamic acid (CYKLOKAPRON) 2,500 mg in sodium chloride 0.9 % 250 mL (10 mg/mL) infusion  1.5 mg/kg/hr Intravenous To OR Ivin Poot, MD      . Derrill Memo ON 07/22/2016] tranexamic acid (CYKLOKAPRON) bolus via infusion - over 30 minutes 1,626 mg  15 mg/kg Intravenous To OR Ivin Poot, MD      . Derrill Memo ON 07/22/2016] tranexamic acid (CYKLOKAPRON) pump prime solution 217 mg  2 mg/kg Intracatheter To OR Ivin Poot, MD      . Derrill Memo ON 07/22/2016] vancomycin (VANCOCIN) 1,500 mg in sodium chloride 0.9 % 250 mL IVPB  1,500 mg Intravenous To OR Ivin Poot, MD        Prescriptions Prior to Admission  Medication Sig Dispense  Refill Last Dose  . albuterol (PROAIR HFA) 108 (90 Base) MCG/ACT inhaler Inhale 2 puffs into the lungs as needed. 2 Inhaler 3 07/20/2016 at Unknown time  . cetirizine (ZYRTEC) 10 MG tablet Take 10 mg by mouth daily.     07/20/2016 at Unknown time  . Cholecalciferol (VITAMIN D) 2000 UNITS CAPS Take by mouth.     07/20/2016 at Unknown time  . citalopram (CELEXA) 20 MG tablet TAKE 1 BY MOUTH DAILY 90 tablet 2 07/20/2016 at Unknown time  . dicyclomine (BENTYL) 20 MG tablet Take 20 mg by mouth 3 (three) times daily.   07/20/2016 at Unknown time  . fish oil-omega-3 fatty acids 1000 MG capsule Take 2 g by mouth daily. PER PATIENT TAKE 8000 MG DAILY   07/20/2016 at Unknown time  . fluticasone (FLONASE) 50 MCG/ACT nasal spray Place 2 sprays into both nostrils daily. 48 g 3 07/20/2016 at Unknown time  . fluticasone (FLOVENT HFA) 110 MCG/ACT inhaler Inhale 2 puffs into the lungs 2 (two) times daily. 1 Inhaler 12 07/20/2016 at Unknown time  . gabapentin (NEURONTIN) 300 MG capsule TAKE 1 BY MOUTH IN THE MORNING AND 2 AT BEDTIME 270 capsule 2 07/20/2016 at Unknown time  . glimepiride (AMARYL) 4 MG tablet Take 1/2 tablet twice a day before main meals. 90 tablet 1 07/20/2016 at Unknown time  . hydrOXYzine (ATARAX/VISTARIL) 25 MG tablet Take 1 tablet (25 mg total) by mouth every 8 (eight) hours as needed. (Patient taking differently: Take 25 mg by mouth every 8 (eight) hours as needed for anxiety. ) 30 tablet 1 07/20/2016 at Unknown time  . lisinopril-hydrochlorothiazide (PRINZIDE,ZESTORETIC) 20-12.5 MG tablet Take 1 tablet by mouth 2 (two) times daily. 180 tablet 2 07/20/2016 at Unknown time  . losartan (COZAAR) 100 MG tablet Take 1 tablet (100 mg total) by mouth daily. 90 tablet 2 07/20/2016 at Unknown time  . metoprolol succinate (TOPROL-XL) 50 MG 24 hr tablet Take 2 tablets (100 mg total) by mouth daily. Take with or immediately following a meal. 180 tablet 2 07/20/2016 at 0800  . Multiple Vitamin (MULTIVITAMIN)  tablet Take 1 tablet by mouth daily.  07/20/2016 at Unknown time  . omeprazole (PRILOSEC) 20 MG capsule TAKE 1 BY MOUTH DAILY 90 capsule 2 07/20/2016 at Unknown time  . ondansetron (ZOFRAN-ODT) 8 MG disintegrating tablet DISSOLVE 1 ON TONGUE EVERY 12 HOURS AS NEEDED FOR NAUSEA 30 tablet 3 Past Week at Unknown time  . Thiamine Mononitrate (VITAMIN B1 PO) Take 1 tablet by mouth daily.   07/20/2016 at Unknown time  . traMADol (ULTRAM) 50 MG tablet Take 1 tablet (50 mg total) by mouth every 8 (eight) hours as needed. (Patient taking differently: Take 50 mg by mouth every 6 (six) hours as needed for moderate pain. ) 30 tablet 2 07/19/2016 at Unknown time  . triamcinolone cream (KENALOG) 0.1 % Apply 1 application topically as needed. 30 g 2 Past Week at Unknown time  . glucose blood test strip Use as instructed 200 each 5 Taking    Family History  Problem Relation Age of Onset  . Heart disease Mother   . Cancer Mother 63    lung  . Breast cancer Mother     maternal grandmother  . Diabetes Sister     type 2  . Breast cancer Brother   . Cancer Maternal Grandmother     breast  . Leukemia Paternal Grandmother     neoplast  . Aneurysm Paternal Grandfather     abdominal (stomach)  . Heart disease Father   . Colon cancer Neg Hx      Review of Systems:   Ohy  Cardiac Review of Systems: Y or N  Chest Pain [    ]  Resting SOB [email or web address]   ] Exertional SOB  [ y ]  Orthopnea [ n ]   Pedal Edema [n   ]    Palpitations [ y ] Syncope  [n  ]   Presyncope [ n  ]  General Review of Systems: [Y] = yes [  ]=no Constitional: recent weight change [  ]; anorexia [  ]; fatigue [  ]; nausea [  ]; night sweats [  ]; fever [  ]; or chills [  ]                                                               Dental: poor dentition[  ]; Last Dentist visit: One year  Eye : blurred vision [  ]; diplopia [   ]; vision changes [  ];  Amaurosis fugax[  ]; Resp: cough [  ];  wheezing[  ];  hemoptysis[  ]; shortness of breath[   ]; paroxysmal nocturnal dyspnea[  ]; dyspnea on exertion[ y ]; or orthopnea[  ];  GI:  gallstones[  ], vomiting[  ];  dysphagia[  ]; melena[  ];  hematochezia [  ]; heartburn[  ];   Hx of  Colonoscopy[  ]; GU: kidney stones [  ]; hematuria[  ];   dysuria [  ];  nocturia[  ];  history of     obstruction [  ]; urinary frequency [email or web address]  ]             Skin: rash, swelling[  ];, hair loss[  ];  peripheral edema[  ];  or itching[  ]; Musculosketetal: myalgias[  ];  joint swelling[  ];  joint erythema[  ];  joint pain[  ];  back pain[  ];  Heme/Lymph: bruising[  ];  bleeding[  ];  anemia[  ];  Neuro: TIA[  ];  headaches[ y ];  stroke[  ];  vertigo[  ];  seizures[  ];   paresthesias[  ];  difficulty walking[  ];  Psych:depression[  ]; anxiety[  ];  Endocrine: diabetes[y  ];  thyroid dysfunction[  ];  Immunizations: Flu [  ]; Pneumococcal[  ];  Other:Right-hand dominant  Physical Exam: BP (!) 147/61   Pulse 83   Temp 97.9 F (36.6 C) (Oral)   Resp 16   Ht _0  (1.575 m)   Wt 238 lb 15.7 oz (108.4 kg)   SpO2 97%   BMI 43.71 kg/m        Physical Exam  General: Pleasant overweight middle-aged Caucasian female no acute distress  HEENT: Normocephalic pupils equal , dentition adequate Neck: Supple without JVD, adenopathy, or bruit Chest: Clear to auscultation, symmetrical breath sounds, no rhonchi, no tenderness             or deformity Cardiovascular: Regular rate and rhythm, no murmur, no gallop, peripheral pulses             palpable in all extremities Abdomen:  Soft, nontender, no palpable mass or organomegaly Extremities: Warm, well-perfused, no clubbing cyanosis edema or tenderness,              no venous stasis changes of the legs Rectal/GU: Deferred Neuro: Grossly non--focal and symmetrical throughout Skin: Clean and dry without rash or ulceration   Diagnostic Studies & Laboratory data:     Recent Radiology Findings:   Dg Chest 2 View  Result Date: 07/20/2016 CLINICAL DATA:   SOB x 1 week EXAM: CHEST - 2 VIEW COMPARISON:  06/27/2014 FINDINGS: Stable left subclavian pacemaker.  No pneumothorax.  Lungs clear. Heart size and mediastinal contours are within normal limits. No effusion. Visualized bones unremarkable. IMPRESSION: No acute cardiopulmonary disease. Electronically Signed   By: Lucrezia Europe M.D.   On: 07/20/2016 10:09     I have independently reviewed the above radiologic studies.  Recent Lab Findings: Lab Results  Component Value Date   WBC 7.6 07/21/2016   HGB 13.2 07/21/2016   HCT 38.4 07/21/2016   PLT 186 07/21/2016   GLUCOSE 125 (H) 07/21/2016   CHOL 254 (H) 07/21/2016   TRIG 255 (H) 07/21/2016   HDL 39 (L) 07/21/2016   LDLDIRECT 195 (H) 04/18/2013   LDLCALC 164 (H) 07/21/2016   ALT 28 07/21/2016   AST 68 (H) 07/21/2016   NA 139 07/21/2016   K 3.8 07/21/2016   CL 103 07/21/2016   CREATININE 0.92 07/21/2016   BUN 14 07/21/2016   CO2 26 07/21/2016   TSH 1.251 09/26/2012   INR 1.0 RATIO 09/04/2008   HGBA1C 7.1 (H) 07/20/2016      Assessment / Plan:     Severe multivessel CAD with acute STEMI secondary to circumflex occlusion. There is collateralization from the distal right circulation to the circumflex territory. Systolic function is preserved. She had an episode of chest pain at rest last night Echocardiogram is pending. Renal function is baseline normal. We'll plan on multivessel CABG tomorrow. Procedure indications benefits and risks reviewed with patient and she understands and agrees to proceed.        07/21/2016 1:38 PM

## 2016-07-21 NOTE — Assessment & Plan Note (Signed)
Continue CPAP at home settings

## 2016-07-21 NOTE — Progress Notes (Signed)
Pre-op Cardiac Surgery  Carotid Findings:   Findings are consistent a 1-39 percent stenosis involving the right internal carotid artery and the left internal carotid artery. The vertebral arteries demonstrate antegrade flow.  Upper Extremity Right Left  Brachial Pressures 135 133  Radial Waveforms Triphasic Triphasic  Ulnar Waveforms Triphasic Triphasic  Palmar Arch (Allen's Test) Palmar waveforms are obliterated with radial compression and are decreased greater than fifty percent with ulnar compression. Palmar waveforms remain within normal limits with radial compression and decrease greater than fifty percent with ulnar compression.   Lower  Extremity Right Left  Dorsalis Pedis 154 131  Posterior Tibial 139 143  Ankle/Brachial Indices 1.14 1.06    Findings:   Right ABI of 1.14 and left ABI of 1.06 are suggestive of normal arterial flow at rest.  07/21/16 4:18 PM [name] [name] RVT

## 2016-07-21 NOTE — Assessment & Plan Note (Signed)
Currently on Amaryl along with sliding scale insulin. Determine requirements for long-acting insulin if necessary.

## 2016-07-21 NOTE — Assessment & Plan Note (Signed)
Severe LAD disease noted along with 100% occluded circumflex and second right posterolateral branch. Plan for CABG later this week.  IV heparin and nitroglycerin

## 2016-07-21 NOTE — Progress Notes (Signed)
Subjective/Objective   Patient Name: [name] [name] Date of Encounter: 07/21/2016   Patient Care Team: Barton Fanny, MD as PCP - General (Family Medicine) Bryson Ha Himmelrich, RD as Dietitian Deboraha Sprang, MD as Attending Physician (Cardiology) Marygrace Drought, MD as Consulting Physician (Ophthalmology) Vickey Huger, MD as Consulting Physician (Orthopedic Surgery)   Hospital Problem List     Principal Problem:   STEMI involving left circumflex coronary artery [facility]) Active Problems:   Multiple vessel coronary artery disease   Severe essential hypertension   DIASTOLIC HEART FAILURE, CHRONIC   Diabetes mellitus type 2, uncontrolled, with complications ([place])   Hyperlipidemia   Pacemaker Medtronic   OSA (obstructive sleep apnea)    Subjective   Feels better this morning. Did start having a little chest heaviness earlier, and nitroglycerin was restarted. Now feeling improved. Slept well last night. No orthopnea or PND.  Inpatient Medications    . aspirin EC  325 mg Oral Daily  . atorvastatin  80 mg Oral q1800  . citalopram  20 mg Oral Daily  . dicyclomine  20 mg Oral TID  . fluticasone  2 spray Each Nare Daily  . gabapentin  300 mg Oral q morning - 10a  . gabapentin  600 mg Oral QHS  . glimepiride  2 mg Oral Q breakfast  . insulin aspart  0-20 Units Subcutaneous TID WC  . loratadine  10 mg Oral Daily  . metoprolol succinate  100 mg Oral Daily  . pantoprazole  40 mg Oral Daily  . sodium chloride flush  3 mL Intravenous Q12H    Vital Signs    Vitals:   07/21/16 0615 07/21/16 0630 07/21/16 0645 07/21/16 0700  BP: (!) 140/99 (!) 160/84 (!) 150/85 (!) 151/81  Pulse: 73 66 71 70  Resp: 16 16 15 16   Temp:      TempSrc:      SpO2: 93% 93% 93% 94%  Weight:      Height:        Intake/Output Summary (Last 24 hours) at 07/21/16 0750 Last data filed at 07/21/16 0700  Gross per 24 hour  Intake           573.98 ml  Output             1600 ml  Net         -1026.02  ml   Filed Weights   07/20/16 2000  Weight: 108.4 kg (238 lb 15.7 oz)    Physical Exam   GEN: Well nourished, well developed, in no acute distress. Obese HEENT: Grossly normal.  Neck: Supple, no JVD, carotid bruits, or masses. Cardiac: RRR, distant S1 and S2. no murmurs, rubs, or gallops. No clubbing, cyanosis, edema.  Radials/DP/PT 2+ and equal bilaterally.  Respiratory:  Respirations regular and unlabored, clear to auscultation bilaterally. GI: Soft, nontender, nondistended, BS + x 4. MS: no deformity or atrophy.; Radial cath site C/D/I Skin: warm and dry, no rash. Neuro:  Strength and sensation are intact. Psych: AAOx3.  Normal affect.  Labs    CBC  Recent Labs  07/20/16 1740 07/21/16 0420  WBC 7.5 7.6  NEUTROABS 5.6  --   HGB 13.0 13.2  HCT 37.5 38.4  MCV 88.2 88.5  PLT 205 99991111   Basic Metabolic Panel  Recent Labs  07/20/16 1407 07/21/16 0420  NA 137 139  K 4.5 3.8  CL 101 103  CO2 25 26  GLUCOSE 146* 125*  BUN 18 14  CREATININE 0.96 0.92  CALCIUM 10.5* 9.7   Liver Function Tests  Recent Labs  07/20/16 0956 07/21/16 0420  AST 23 68*  ALT 20 28  ALKPHOS 68 55  BILITOT 0.5 1.0  PROT 7.3 7.0  ALBUMIN 4.6 3.9   No results for input(s): LIPASE, AMYLASE in the last 72 hours. Cardiac Enzymes  Recent Labs  07/20/16 1740 07/20/16 2324 07/21/16 0420  TROPONINI 1.18* 7.49* 6.39*   BNP Invalid input(s): POCBNP D-Dimer No results for input(s): DDIMER in the last 72 hours. Hemoglobin A1C  Recent Labs  07/20/16 1740  HGBA1C 7.1*   Fasting Lipid Panel  Recent Labs  07/21/16 0537  CHOL 254*  HDL 39*  LDLCALC 164*  TRIG 255*  CHOLHDL 6.5   Thyroid Function Tests No results for input(s): TSH, T4TOTAL, T3FREE, THYROIDAB in the last 72 hours.  Invalid input(s): FREET3  Telemetry    Sinus rhythm in the 70s  ECG    Normal sinus rhythm 67 BPM. Nonspecific ST and T-wave changes.   Cardiology   CARDIAC CATH  07/20/2016 FINDINGS  Ost 1st Sept to 1st Sept lesion, 90 %stenosed.    Prox LAD lesion, 90 %stenosed.  Mid LAD lesion, 80 %stenosed.  Dist LAD lesion, 70 %stenosed.  Ost Cx to Prox Cx lesion, 100 %stenosed. - Fills via right to left collaterals  2nd RPLB lesion, 100 %stenosed.  The left ventricular systolic function is normal.  The left ventricular ejection fraction is 50-55% by visual estimate.  LV end diastolic pressure is moderately elevated.   CONCLUSION 1. Severe 3 vessel obstructive CAD.    - there is complex proximal to mid LAD disease at the takeoff of a large septal perforator and 2 large diagonal branches.    - 100% occlusion of the proximal LCx- culprit vessel. This fills by right to left collaterals.    -100% occlusion of a small PL branch of the RCA that fills by left to right collaterals. 2. Overall good LV function 3. Moderately elevated LVEDP  PLAN: patient is currently pain free. Will transfer to ICU and continue IV Ntg and heparin. The complex LAD disease make it unfavorable to treat her with PCI. Would recommend CT surgery consult for CABG.    Radiology    Dg Chest 2 View  Result Date: 07/20/2016 CLINICAL DATA:  SOB x 1 week EXAM: CHEST - 2 VIEW COMPARISON:  06/27/2014 FINDINGS: Stable left subclavian pacemaker.  No pneumothorax.  Lungs clear. Heart size and mediastinal contours are within normal limits. No effusion. Visualized bones unremarkable. IMPRESSION: No acute cardiopulmonary disease. Electronically Signed   By: Lucrezia Europe M.D.   On: 07/20/2016 10:09    Patient Profile     [name] [name] is a 60 y.o. female with past medical history of sinus node dysfunction (s/p PPM placement), Type 2 DM, HTN, and HLD who presents to [facility] ED on 07/20/2016 for evaluation of chest pain and dyspnea.  She was noted to have STE in V2 & V3 with initial Troponin of 0.15.  With ST elevation on EKG and continued chest discomfort, she was brought to the cardiac  catheterization lab for an emergent Cardiac catheterization which revealed multivessel CAD.   Assessment & Plan      Scheduled Meds: . aspirin EC  325 mg Oral Daily  . atorvastatin  80 mg Oral q1800  . citalopram  20 mg Oral Daily  . dicyclomine  20 mg Oral TID  . fluticasone  2 spray Each Nare Daily  . gabapentin  300 mg Oral q morning - 10a  . gabapentin  600 mg Oral QHS  . glimepiride  2 mg Oral Q breakfast  . insulin aspart  0-20 Units Subcutaneous TID WC  . loratadine  10 mg Oral Daily  . metoprolol succinate  100 mg Oral Daily  . pantoprazole  40 mg Oral Daily  . sodium chloride flush  3 mL Intravenous Q12H   Continuous Infusions: . heparin 1,050 Units/hr (07/21/16 0700)  . nitroGLYCERIN 10 mcg/min (07/21/16 0800)   PRN Meds:sodium chloride, acetaminophen, albuterol, morphine injection, nitroGLYCERIN, ondansetron (ZOFRAN) IV, sodium chloride flush  Vital signs in last 24 hours: Temp:  [97.9 F (36.6 C)-98.8 F (37.1 C)] 98.8 F (37.1 C) (10/11 0400) Pulse Rate:  [0-87] 72 (10/11 0800) Resp:  [0-33] 17 (10/11 0800) BP: (110-185)/(61-105) 168/105 (10/11 0800) SpO2:  [0 %-98 %] 97 % (10/11 0800) Weight:  [108.4 kg (238 lb 15.7 oz)-108.4 kg (239 lb)] 108.4 kg (238 lb 15.7 oz) (10/10 2000)  Intake/Output last 3 shifts: I/O last 3 completed shifts: In: 574 [I.V.:574] Out: 1600 [Urine:1600] Intake/Output this shift: Total I/O In: 13.5 [I.V.:13.5] Out: -   Problem Assessment/Plan Cardiovascular and Mediastinum  DIASTOLIC HEART FAILURE, CHRONIC  Assessment & Plan   Chronic diastolic heart failure now exacerbated with MI. EDP was of a 28 mmHg.  We'll need to add afterload reduction - was on both ACE inhibitor and ARB at home. Will start ACE inhibitor now. Start low-dose Lasix     Severe essential hypertension  Assessment & Plan   Blood pressure remains elevated.  On Toprol 100 mg daily and restarting nitroglycerin drip. Need to determine whether or not she was  on ARB versus ACE inhibitor and restart home medication. - Apparently she does take both losartan 100 mg and lisinopril 20 g - > will use ACE inhibitor. May require calcium channel blocker.     Multiple vessel coronary artery disease  Assessment & Plan   Severe LAD disease noted along with 100% occluded circumflex and second right posterolateral branch. Plan for CABG later this week.  IV heparin and nitroglycerin     * STEMI involving left circumflex coronary artery [facility])  Assessment & Plan   ST segments seemed to resolved. Occluded circumflex noted with collaterals from right to left. Plan:   Heparin infusion.   Restart nitroglycerin drip, as she has been having chest pain.  CT surgical consultation for CABG later this week - Dr. Tharon Aquas Trigt  High-dose statin, aspirin, high-dose beta blocker      Respiratory  OSA (obstructive sleep apnea)  Assessment & Plan   Continue CPAP at home settings     Endocrine  Diabetes mellitus type 2, uncontrolled, with complications ([place])  Assessment & Plan   Currently on Amaryl along with sliding scale insulin. Determine requirements for long-acting insulin if necessary.     Other  Hyperlipidemia  Assessment & Plan   Check fasting lipid panel prior to discharge. High-dose statin in setting of MI.       Glenetta Hew, M.D., M.S. Interventional Cardiologist   Pager # [phone] Phone # [phone] [address] [place], [place] [address]

## 2016-07-21 NOTE — Progress Notes (Signed)
CARDIAC REHAB PHASE I   Pt has had chest pain with minimal exertion, hold ambulation per RN. Cardiac surgery pre-op education completed with pt and family at bedside. Reviewed IS, sternal precautions, activity progression, cardiac surgery booklet and cardiac surgery guidelines. Pt verbalized understanding, states she has already watched the videos on you tube. Pt in bed, call bell within reach, will follow post-op.   [place], RN, BSN 07/21/2016 2:26 PM

## 2016-07-21 NOTE — Subjective & Objective (Addendum)
Patient Name: [name] [name] Date of Encounter: 07/21/2016   Patient Care Team: Barton Fanny, MD as PCP - General (Family Medicine) Bryson Ha Himmelrich, RD as Dietitian Deboraha Sprang, MD as Attending Physician (Cardiology) Marygrace Drought, MD as Consulting Physician (Ophthalmology) Vickey Huger, MD as Consulting Physician (Orthopedic Surgery)   Hospital Problem List     Principal Problem:   STEMI involving left circumflex coronary artery [facility]) Active Problems:   Multiple vessel coronary artery disease   Severe essential hypertension   DIASTOLIC HEART FAILURE, CHRONIC   Diabetes mellitus type 2, uncontrolled, with complications ([place])   Hyperlipidemia   Pacemaker Medtronic   OSA (obstructive sleep apnea)    Subjective   Feels better this morning. Did start having a little chest heaviness earlier, and nitroglycerin was restarted. Now feeling improved. Slept well last night. No orthopnea or PND.  Inpatient Medications    . aspirin EC  325 mg Oral Daily  . atorvastatin  80 mg Oral q1800  . citalopram  20 mg Oral Daily  . dicyclomine  20 mg Oral TID  . fluticasone  2 spray Each Nare Daily  . gabapentin  300 mg Oral q morning - 10a  . gabapentin  600 mg Oral QHS  . glimepiride  2 mg Oral Q breakfast  . insulin aspart  0-20 Units Subcutaneous TID WC  . loratadine  10 mg Oral Daily  . metoprolol succinate  100 mg Oral Daily  . pantoprazole  40 mg Oral Daily  . sodium chloride flush  3 mL Intravenous Q12H    Vital Signs    Vitals:   07/21/16 0615 07/21/16 0630 07/21/16 0645 07/21/16 0700  BP: (!) 140/99 (!) 160/84 (!) 150/85 (!) 151/81  Pulse: 73 66 71 70  Resp: 16 16 15 16   Temp:      TempSrc:      SpO2: 93% 93% 93% 94%  Weight:      Height:        Intake/Output Summary (Last 24 hours) at 07/21/16 0750 Last data filed at 07/21/16 0700  Gross per 24 hour  Intake           573.98 ml  Output             1600 ml  Net         -1026.02 ml   Filed Weights   07/20/16 2000  Weight: 108.4 kg (238 lb 15.7 oz)    Physical Exam   GEN: Well nourished, well developed, in no acute distress. Obese HEENT: Grossly normal.  Neck: Supple, no JVD, carotid bruits, or masses. Cardiac: RRR, distant S1 and S2. no murmurs, rubs, or gallops. No clubbing, cyanosis, edema.  Radials/DP/PT 2+ and equal bilaterally.  Respiratory:  Respirations regular and unlabored, clear to auscultation bilaterally. GI: Soft, nontender, nondistended, BS + x 4. MS: no deformity or atrophy.; Radial cath site C/D/I Skin: warm and dry, no rash. Neuro:  Strength and sensation are intact. Psych: AAOx3.  Normal affect.  Labs    CBC  Recent Labs  07/20/16 1740 07/21/16 0420  WBC 7.5 7.6  NEUTROABS 5.6  --   HGB 13.0 13.2  HCT 37.5 38.4  MCV 88.2 88.5  PLT 205 99991111   Basic Metabolic Panel  Recent Labs  07/20/16 1407 07/21/16 0420  NA 137 139  K 4.5 3.8  CL 101 103  CO2 25 26  GLUCOSE 146* 125*  BUN 18 14  CREATININE 0.96 0.92  CALCIUM 10.5*  9.7   Liver Function Tests  Recent Labs  07/20/16 0956 07/21/16 0420  AST 23 68*  ALT 20 28  ALKPHOS 68 55  BILITOT 0.5 1.0  PROT 7.3 7.0  ALBUMIN 4.6 3.9   No results for input(s): LIPASE, AMYLASE in the last 72 hours. Cardiac Enzymes  Recent Labs  07/20/16 1740 07/20/16 2324 07/21/16 0420  TROPONINI 1.18* 7.49* 6.39*   BNP Invalid input(s): POCBNP D-Dimer No results for input(s): DDIMER in the last 72 hours. Hemoglobin A1C  Recent Labs  07/20/16 1740  HGBA1C 7.1*   Fasting Lipid Panel  Recent Labs  07/21/16 0537  CHOL 254*  HDL 39*  LDLCALC 164*  TRIG 255*  CHOLHDL 6.5   Thyroid Function Tests No results for input(s): TSH, T4TOTAL, T3FREE, THYROIDAB in the last 72 hours.  Invalid input(s): FREET3  Telemetry    Sinus rhythm in the 70s  ECG    Normal sinus rhythm 67 BPM. Nonspecific ST and T-wave changes.   Cardiology   CARDIAC CATH 07/20/2016 FINDINGS  Ost 1st Sept to 1st  Sept lesion, 90 %stenosed.    Prox LAD lesion, 90 %stenosed.  Mid LAD lesion, 80 %stenosed.  Dist LAD lesion, 70 %stenosed.  Ost Cx to Prox Cx lesion, 100 %stenosed. - Fills via right to left collaterals  2nd RPLB lesion, 100 %stenosed.  The left ventricular systolic function is normal.  The left ventricular ejection fraction is 50-55% by visual estimate.  LV end diastolic pressure is moderately elevated.   CONCLUSION 1. Severe 3 vessel obstructive CAD.    - there is complex proximal to mid LAD disease at the takeoff of a large septal perforator and 2 large diagonal branches.    - 100% occlusion of the proximal LCx- culprit vessel. This fills by right to left collaterals.    -100% occlusion of a small PL branch of the RCA that fills by left to right collaterals. 2. Overall good LV function 3. Moderately elevated LVEDP  PLAN: patient is currently pain free. Will transfer to ICU and continue IV Ntg and heparin. The complex LAD disease make it unfavorable to treat her with PCI. Would recommend CT surgery consult for CABG.    Radiology    Dg Chest 2 View  Result Date: 07/20/2016 CLINICAL DATA:  SOB x 1 week EXAM: CHEST - 2 VIEW COMPARISON:  06/27/2014 FINDINGS: Stable left subclavian pacemaker.  No pneumothorax.  Lungs clear. Heart size and mediastinal contours are within normal limits. No effusion. Visualized bones unremarkable. IMPRESSION: No acute cardiopulmonary disease. Electronically Signed   By: Lucrezia Europe M.D.   On: 07/20/2016 10:09    Patient Profile     [name] [name] is a 60 y.o. female with past medical history of sinus node dysfunction (s/p PPM placement), Type 2 DM, HTN, and HLD who presents to [facility] ED on 07/20/2016 for evaluation of chest pain and dyspnea.  She was noted to have STE in V2 & V3 with initial Troponin of 0.15.  With ST elevation on EKG and continued chest discomfort, she was brought to the cardiac catheterization lab for an emergent Cardiac  catheterization which revealed multivessel CAD.   Assessment & Plan

## 2016-07-21 NOTE — Assessment & Plan Note (Signed)
Check fasting lipid panel prior to discharge. High-dose statin in setting of MI.

## 2016-07-21 NOTE — Progress Notes (Signed)
  Echocardiogram 2D Echocardiogram has been performed.  [name] [name] 07/21/2016, 5:23 PM

## 2016-07-22 ENCOUNTER — Inpatient Hospital Stay (HOSPITAL_COMMUNITY): Payer: BLUE CROSS/BLUE SHIELD

## 2016-07-22 ENCOUNTER — Other Ambulatory Visit (HOSPITAL_COMMUNITY): Payer: Self-pay

## 2016-07-22 ENCOUNTER — Encounter (HOSPITAL_COMMUNITY)
Admission: EM | Disposition: A | Discharge status: Home / Self Care | Payer: Self-pay | Source: Home / Self Care | Attending: Cardiothoracic Surgery

## 2016-07-22 ENCOUNTER — Inpatient Hospital Stay (HOSPITAL_COMMUNITY): Payer: BLUE CROSS/BLUE SHIELD | Admitting: Anesthesiology

## 2016-07-22 DIAGNOSIS — Z951 Presence of aortocoronary bypass graft: Secondary | ICD-10-CM

## 2016-07-22 DIAGNOSIS — I25110 Atherosclerotic heart disease of native coronary artery with unstable angina pectoris: Secondary | ICD-10-CM

## 2016-07-22 HISTORY — PX: TEE WITHOUT CARDIOVERSION: SHX5443

## 2016-07-22 HISTORY — PX: CORONARY ARTERY BYPASS GRAFT: SHX141

## 2016-07-22 LAB — CBC
HEMATOCRIT: 35.7 % — AB (ref 36.0–46.0)
HEMATOCRIT: 29.4 % — AB (ref 36.0–46.0)
HEMOGLOBIN: 12.5 g/dL (ref 12.0–15.0)
Hemoglobin: 10.1 g/dL — ABNORMAL LOW (ref 12.0–15.0)
MCH: 31 pg (ref 26.0–34.0)
MCH: 30.5 pg (ref 26.0–34.0)
MCHC: 35 g/dL (ref 30.0–36.0)
MCHC: 34.4 g/dL (ref 30.0–36.0)
MCV: 88.6 fL (ref 78.0–100.0)
MCV: 88.8 fL (ref 78.0–100.0)
Platelets: 187 10*3/uL (ref 150–400)
Platelets: 152 10*3/uL (ref 150–400)
RBC: 4.03 MIL/uL (ref 3.87–5.11)
RBC: 3.31 MIL/uL — ABNORMAL LOW (ref 3.87–5.11)
RDW: 13.1 % (ref 11.5–15.5)
RDW: 12.9 % (ref 11.5–15.5)
WBC: 8.5 10*3/uL (ref 4.0–10.5)
WBC: 16.8 10*3/uL — ABNORMAL HIGH (ref 4.0–10.5)

## 2016-07-22 LAB — POCT I-STAT, CHEM 8
BUN: 15 mg/dL (ref 6–20)
BUN: 12 mg/dL (ref 6–20)
BUN: 14 mg/dL (ref 6–20)
BUN: 13 mg/dL (ref 6–20)
BUN: 15 mg/dL (ref 6–20)
CALCIUM ION: 1.24 mmol/L (ref 1.15–1.40)
CHLORIDE: 98 mmol/L — AB (ref 101–111)
CHLORIDE: 98 mmol/L — AB (ref 101–111)
CREATININE: 0.5 mg/dL (ref 0.44–1.00)
CREATININE: 0.6 mg/dL (ref 0.44–1.00)
Calcium, Ion: 1.01 mmol/L — ABNORMAL LOW (ref 1.15–1.40)
Calcium, Ion: 1.21 mmol/L (ref 1.15–1.40)
Calcium, Ion: 1.01 mmol/L — ABNORMAL LOW (ref 1.15–1.40)
Calcium, Ion: 0.94 mmol/L — ABNORMAL LOW (ref 1.15–1.40)
Chloride: 93 mmol/L — ABNORMAL LOW (ref 101–111)
Chloride: 98 mmol/L — ABNORMAL LOW (ref 101–111)
Chloride: 97 mmol/L — ABNORMAL LOW (ref 101–111)
Creatinine, Ser: 0.8 mg/dL (ref 0.44–1.00)
Creatinine, Ser: 0.5 mg/dL (ref 0.44–1.00)
Creatinine, Ser: 0.7 mg/dL (ref 0.44–1.00)
GLUCOSE: 194 mg/dL — AB (ref 65–99)
Glucose, Bld: 161 mg/dL — ABNORMAL HIGH (ref 65–99)
Glucose, Bld: 172 mg/dL — ABNORMAL HIGH (ref 65–99)
Glucose, Bld: 191 mg/dL — ABNORMAL HIGH (ref 65–99)
Glucose, Bld: 203 mg/dL — ABNORMAL HIGH (ref 65–99)
HCT: 33 % — ABNORMAL LOW (ref 36.0–46.0)
HEMATOCRIT: 27 % — AB (ref 36.0–46.0)
HEMATOCRIT: 37 % (ref 36.0–46.0)
HEMATOCRIT: 30 % — AB (ref 36.0–46.0)
HEMATOCRIT: 27 % — AB (ref 36.0–46.0)
HEMOGLOBIN: 11.2 g/dL — AB (ref 12.0–15.0)
HEMOGLOBIN: 12.6 g/dL (ref 12.0–15.0)
HEMOGLOBIN: 9.2 g/dL — AB (ref 12.0–15.0)
HEMOGLOBIN: 10.2 g/dL — AB (ref 12.0–15.0)
Hemoglobin: 9.2 g/dL — ABNORMAL LOW (ref 12.0–15.0)
POTASSIUM: 3.8 mmol/L (ref 3.5–5.1)
POTASSIUM: 5 mmol/L (ref 3.5–5.1)
POTASSIUM: 4.6 mmol/L (ref 3.5–5.1)
Potassium: 3.9 mmol/L (ref 3.5–5.1)
Potassium: 4.2 mmol/L (ref 3.5–5.1)
SODIUM: 137 mmol/L (ref 135–145)
SODIUM: 135 mmol/L (ref 135–145)
SODIUM: 136 mmol/L (ref 135–145)
SODIUM: 135 mmol/L (ref 135–145)
Sodium: 135 mmol/L (ref 135–145)
TCO2: 27 mmol/L (ref 0–100)
TCO2: 25 mmol/L (ref 0–100)
TCO2: 27 mmol/L (ref 0–100)
TCO2: 25 mmol/L (ref 0–100)
TCO2: 26 mmol/L (ref 0–100)

## 2016-07-22 LAB — POCT I-STAT 3, ART BLOOD GAS (G3+)
ACID-BASE DEFICIT: 2 mmol/L (ref 0.0–2.0)
ACID-BASE DEFICIT: 1 mmol/L (ref 0.0–2.0)
Acid-Base Excess: 1 mmol/L (ref 0.0–2.0)
Acid-Base Excess: 1 mmol/L (ref 0.0–2.0)
BICARBONATE: 27 mmol/L (ref 20.0–28.0)
BICARBONATE: 26.7 mmol/L (ref 20.0–28.0)
Bicarbonate: 26.4 mmol/L (ref 20.0–28.0)
Bicarbonate: 24.6 mmol/L (ref 20.0–28.0)
O2 Saturation: 100 %
O2 Saturation: 97 %
O2 Saturation: 93 %
O2 Saturation: 95 %
PH ART: 7.369 (ref 7.350–7.450)
PH ART: 7.373 (ref 7.350–7.450)
PO2 ART: 272 mmHg — AB (ref 83.0–108.0)
PO2 ART: 99 mmHg (ref 83.0–108.0)
TCO2: 28 mmol/L (ref 0–100)
TCO2: 28 mmol/L (ref 0–100)
TCO2: 28 mmol/L (ref 0–100)
TCO2: 26 mmol/L (ref 0–100)
pCO2 arterial: 46.8 mmHg (ref 32.0–48.0)
pCO2 arterial: 45.8 mmHg (ref 32.0–48.0)
pCO2 arterial: 59.8 mmHg — ABNORMAL HIGH (ref 32.0–48.0)
pCO2 arterial: 44.2 mmHg (ref 32.0–48.0)
pH, Arterial: 7.247 — ABNORMAL LOW (ref 7.350–7.450)
pH, Arterial: 7.351 (ref 7.350–7.450)
pO2, Arterial: 77 mmHg — ABNORMAL LOW (ref 83.0–108.0)
pO2, Arterial: 79 mmHg — ABNORMAL LOW (ref 83.0–108.0)

## 2016-07-22 LAB — BASIC METABOLIC PANEL
Anion gap: 9 (ref 5–15)
BUN: 14 mg/dL (ref 6–20)
CO2: 27 mmol/L (ref 22–32)
Calcium: 9.3 mg/dL (ref 8.9–10.3)
Chloride: 98 mmol/L — ABNORMAL LOW (ref 101–111)
Creatinine, Ser: 0.96 mg/dL (ref 0.44–1.00)
GFR calc Af Amer: >60 mL/min (ref >60)
GFR calc non Af Amer: >60 mL/min (ref >60)
Glucose, Bld: 175 mg/dL — ABNORMAL HIGH (ref 65–99)
Potassium: 4 mmol/L (ref 3.5–5.1)
Sodium: 134 mmol/L — ABNORMAL LOW (ref 135–145)

## 2016-07-22 LAB — TROPONIN I
TROPONIN I: 4.13 ng/mL — AB (ref <0.03)
TROPONIN I: 4.24 ng/mL — AB (ref <0.03)
Troponin I: 7.19 ng/mL (ref <0.03)

## 2016-07-22 LAB — ECHOCARDIOGRAM COMPLETE
Height: 62 in
Weight: 3823.66 oz

## 2016-07-22 LAB — POCT I-STAT 4, (NA,K, GLUC, HGB,HCT)
Glucose, Bld: 174 mg/dL — ABNORMAL HIGH (ref 65–99)
HCT: 31 % — ABNORMAL LOW (ref 36.0–46.0)
Hemoglobin: 10.5 g/dL — ABNORMAL LOW (ref 12.0–15.0)
POTASSIUM: 3.9 mmol/L (ref 3.5–5.1)
Sodium: 138 mmol/L (ref 135–145)

## 2016-07-22 LAB — HEPARIN LEVEL (UNFRACTIONATED): HEPARIN UNFRACTIONATED: 0.42 [IU]/mL (ref 0.30–0.70)

## 2016-07-22 LAB — PROTIME-INR
INR: 1.33
Prothrombin Time: 16.5 seconds — ABNORMAL HIGH (ref 11.4–15.2)

## 2016-07-22 LAB — GLUCOSE, CAPILLARY
GLUCOSE-CAPILLARY: 180 mg/dL — AB (ref 65–99)
GLUCOSE-CAPILLARY: 174 mg/dL — AB (ref 65–99)
GLUCOSE-CAPILLARY: 169 mg/dL — AB (ref 65–99)
Glucose-Capillary: 162 mg/dL — ABNORMAL HIGH (ref 65–99)
Glucose-Capillary: 165 mg/dL — ABNORMAL HIGH (ref 65–99)
Glucose-Capillary: 177 mg/dL — ABNORMAL HIGH (ref 65–99)

## 2016-07-22 LAB — ECHO INTRAOPERATIVE TEE
Height: 62 in
Weight: 3823.66 oz

## 2016-07-22 LAB — PTH, INTACT AND CALCIUM
Calcium, Total (PTH): 9.6 mg/dL (ref 8.7–10.3)
PTH: 27 pg/mL (ref 15–65)

## 2016-07-22 LAB — APTT: aPTT: 30 seconds (ref 24–36)

## 2016-07-22 LAB — PLATELET COUNT: Platelets: 141 10*3/uL — ABNORMAL LOW (ref 150–400)

## 2016-07-22 LAB — HEMOGLOBIN AND HEMATOCRIT, BLOOD
HCT: 28.9 % — ABNORMAL LOW (ref 36.0–46.0)
Hemoglobin: 10 g/dL — ABNORMAL LOW (ref 12.0–15.0)

## 2016-07-22 SURGERY — CORONARY ARTERY BYPASS GRAFTING (CABG)
Anesthesia: General | Site: Chest

## 2016-07-22 MED ORDER — MORPHINE SULFATE (PF) 2 MG/ML IV SOLN
1.0000 mg | INTRAVENOUS | Status: DC | PRN
  Administered 2016-07-22 – 2016-07-23 (×3): 2 mg via INTRAVENOUS
  Filled 2016-07-22 (×4): qty 1

## 2016-07-22 MED ORDER — FENTANYL CITRATE (PF) 100 MCG/2ML IJ SOLN
50.0000 ug | INTRAMUSCULAR | Status: DC | PRN
  Administered 2016-07-22: 100 ug via INTRAVENOUS

## 2016-07-22 MED ORDER — VANCOMYCIN HCL IN DEXTROSE 1-5 GM/200ML-% IV SOLN
1000.0000 mg | Freq: Once | INTRAVENOUS | Status: AC
  Administered 2016-07-23: 1000 mg via INTRAVENOUS
  Filled 2016-07-22: qty 200

## 2016-07-22 MED ORDER — LIDOCAINE 2% (20 MG/ML) 5 ML SYRINGE
INTRAMUSCULAR | Status: AC
  Filled 2016-07-22: qty 5

## 2016-07-22 MED ORDER — INSULIN REGULAR BOLUS VIA INFUSION
0.0000 [IU] | Freq: Three times a day (TID) | INTRAVENOUS | Status: DC
  Filled 2016-07-22: qty 10

## 2016-07-22 MED ORDER — HEPARIN SODIUM (PORCINE) 1000 UNIT/ML IJ SOLN
INTRAMUSCULAR | Status: AC
  Filled 2016-07-22: qty 1

## 2016-07-22 MED ORDER — SODIUM CHLORIDE 0.9 % IV SOLN
INTRAVENOUS | Status: DC
Start: 2016-07-22 — End: 2016-07-27

## 2016-07-22 MED ORDER — PHENYLEPHRINE HCL 10 MG/ML IJ SOLN
0.0000 ug/min | INTRAVENOUS | Status: DC
  Administered 2016-07-22: 60 ug/min via INTRAVENOUS
  Filled 2016-07-22 (×2): qty 2

## 2016-07-22 MED ORDER — PHENYLEPHRINE HCL 10 MG/ML IJ SOLN
INTRAVENOUS | Status: DC | PRN
  Administered 2016-07-22: 25 ug/min via INTRAVENOUS

## 2016-07-22 MED ORDER — GABAPENTIN 300 MG PO CAPS
300.0000 mg | ORAL_CAPSULE | Freq: Every morning | ORAL | Status: DC
  Administered 2016-07-23 – 2016-07-27 (×5): 300 mg via ORAL
  Filled 2016-07-22 (×4): qty 1
  Filled 2016-07-22: qty 3
  Filled 2016-07-22: qty 1

## 2016-07-22 MED ORDER — ALBUMIN HUMAN 5 % IV SOLN
INTRAVENOUS | Status: DC | PRN
Start: 2016-07-22 — End: 2016-07-22
  Administered 2016-07-22 (×2): via INTRAVENOUS

## 2016-07-22 MED ORDER — SODIUM CHLORIDE 0.45 % IV SOLN
INTRAVENOUS | Status: DC | PRN
  Administered 2016-07-22: 20:00:00 via INTRAVENOUS

## 2016-07-22 MED ORDER — METOPROLOL TARTRATE 12.5 MG HALF TABLET
12.5000 mg | ORAL_TABLET | Freq: Two times a day (BID) | ORAL | Status: DC
  Administered 2016-07-23 – 2016-07-25 (×5): 12.5 mg via ORAL
  Filled 2016-07-22 (×6): qty 1

## 2016-07-22 MED ORDER — MIDAZOLAM HCL 2 MG/2ML IJ SOLN
1.0000 mg | INTRAMUSCULAR | Status: DC | PRN
  Administered 2016-07-22: 2 mg via INTRAVENOUS

## 2016-07-22 MED ORDER — METOPROLOL TARTRATE 25 MG/10 ML ORAL SUSPENSION
12.5000 mg | Freq: Two times a day (BID) | ORAL | Status: DC
Start: 2016-07-23 — End: 2016-07-26

## 2016-07-22 MED ORDER — SODIUM CHLORIDE 0.9 % IJ SOLN
OROMUCOSAL | Status: DC | PRN
  Administered 2016-07-22 (×2): via TOPICAL

## 2016-07-22 MED ORDER — METOPROLOL TARTRATE 12.5 MG HALF TABLET
12.5000 mg | ORAL_TABLET | Freq: Once | ORAL | Status: AC
  Administered 2016-07-22: 12.5 mg via ORAL
  Filled 2016-07-22: qty 1

## 2016-07-22 MED ORDER — SODIUM CHLORIDE 0.9 % IV SOLN
INTRAVENOUS | Status: DC | PRN
  Administered 2016-07-22: 2 [IU]/h via INTRAVENOUS

## 2016-07-22 MED ORDER — PLASMA-LYTE 148 IV SOLN
INTRAVENOUS | Status: DC | PRN
  Administered 2016-07-22: 500 mL via INTRAVASCULAR

## 2016-07-22 MED ORDER — 0.9 % SODIUM CHLORIDE (POUR BTL) OPTIME
TOPICAL | Status: DC | PRN
  Administered 2016-07-22: 1000 mL

## 2016-07-22 MED ORDER — BISACODYL 5 MG PO TBEC
10.0000 mg | DELAYED_RELEASE_TABLET | Freq: Every day | ORAL | Status: DC
  Administered 2016-07-23 – 2016-07-27 (×5): 10 mg via ORAL
  Filled 2016-07-22 (×5): qty 2

## 2016-07-22 MED ORDER — LIDOCAINE HCL (CARDIAC) 20 MG/ML IV SOLN
INTRAVENOUS | Status: DC | PRN
  Administered 2016-07-22: 100 mg via INTRAVENOUS

## 2016-07-22 MED ORDER — LACTATED RINGERS IV SOLN: INTRAVENOUS | Status: DC

## 2016-07-22 MED ORDER — ROCURONIUM BROMIDE 10 MG/ML (PF) SYRINGE
PREFILLED_SYRINGE | INTRAVENOUS | Status: AC
  Filled 2016-07-22: qty 10

## 2016-07-22 MED ORDER — PROTAMINE SULFATE 10 MG/ML IV SOLN
INTRAVENOUS | Status: AC
  Filled 2016-07-22: qty 25

## 2016-07-22 MED ORDER — METOPROLOL TARTRATE 5 MG/5ML IV SOLN
2.5000 mg | INTRAVENOUS | Status: DC | PRN
  Administered 2016-07-24 (×2): 5 mg via INTRAVENOUS
  Filled 2016-07-22 (×2): qty 5

## 2016-07-22 MED ORDER — ATORVASTATIN CALCIUM 80 MG PO TABS
80.0000 mg | ORAL_TABLET | Freq: Every day | ORAL | Status: DC
  Administered 2016-07-23 – 2016-07-26 (×4): 80 mg via ORAL
  Filled 2016-07-22 (×4): qty 1

## 2016-07-22 MED ORDER — OXYCODONE HCL 5 MG PO TABS
5.0000 mg | ORAL_TABLET | ORAL | Status: DC | PRN
  Administered 2016-07-23 – 2016-07-24 (×4): 10 mg via ORAL
  Filled 2016-07-22 (×4): qty 2

## 2016-07-22 MED ORDER — PROPOFOL 10 MG/ML IV BOLUS
INTRAVENOUS | Status: AC
  Filled 2016-07-22: qty 20

## 2016-07-22 MED ORDER — LACTATED RINGERS IV SOLN
INTRAVENOUS | Status: DC | PRN
  Administered 2016-07-22: 13:00:00 via INTRAVENOUS

## 2016-07-22 MED ORDER — SODIUM CHLORIDE 0.9% FLUSH
3.0000 mL | Freq: Two times a day (BID) | INTRAVENOUS | Status: DC
  Administered 2016-07-23 – 2016-07-27 (×7): 3 mL via INTRAVENOUS

## 2016-07-22 MED ORDER — ACETAMINOPHEN 160 MG/5ML PO SOLN: 650.0000 mg | Freq: Once | ORAL | Status: AC

## 2016-07-22 MED ORDER — ACETAMINOPHEN 500 MG PO TABS
1000.0000 mg | ORAL_TABLET | Freq: Four times a day (QID) | ORAL | Status: DC
  Administered 2016-07-23 – 2016-07-27 (×13): 1000 mg via ORAL
  Filled 2016-07-22 (×14): qty 2

## 2016-07-22 MED ORDER — DEXMEDETOMIDINE HCL IN NACL 200 MCG/50ML IV SOLN
0.0000 ug/kg/h | INTRAVENOUS | Status: DC
  Filled 2016-07-22: qty 50

## 2016-07-22 MED ORDER — MIDAZOLAM HCL 5 MG/5ML IJ SOLN
INTRAMUSCULAR | Status: DC | PRN
  Administered 2016-07-22 (×5): 2 mg via INTRAVENOUS

## 2016-07-22 MED ORDER — MILRINONE LACTATE IN DEXTROSE 20-5 MG/100ML-% IV SOLN
0.2000 ug/kg/min | INTRAVENOUS | Status: DC
  Administered 2016-07-22: 0.25 ug/kg/min via INTRAVENOUS
  Administered 2016-07-23: 0.2 ug/kg/min via INTRAVENOUS
  Administered 2016-07-23: 0.25 ug/kg/min via INTRAVENOUS
  Administered 2016-07-24: 0.2 ug/kg/min via INTRAVENOUS
  Filled 2016-07-22 (×4): qty 100

## 2016-07-22 MED ORDER — ARTIFICIAL TEARS OP OINT
TOPICAL_OINTMENT | OPHTHALMIC | Status: DC | PRN
  Administered 2016-07-22: 1 via OPHTHALMIC

## 2016-07-22 MED ORDER — ASPIRIN EC 325 MG PO TBEC
325.0000 mg | DELAYED_RELEASE_TABLET | Freq: Every day | ORAL | Status: DC
  Administered 2016-07-23 – 2016-07-27 (×5): 325 mg via ORAL
  Filled 2016-07-22 (×5): qty 1

## 2016-07-22 MED ORDER — LACTATED RINGERS IV SOLN: 500.0000 mL | Freq: Once | INTRAVENOUS | Status: DC | PRN

## 2016-07-22 MED ORDER — HEPARIN SODIUM (PORCINE) 1000 UNIT/ML IJ SOLN
INTRAMUSCULAR | Status: DC | PRN
  Administered 2016-07-22: 3 mL via INTRAVENOUS
  Administered 2016-07-22: 31 mL via INTRAVENOUS

## 2016-07-22 MED ORDER — ASPIRIN 81 MG PO CHEW
324.0000 mg | CHEWABLE_TABLET | Freq: Every day | ORAL | Status: DC
  Filled 2016-07-22: qty 4

## 2016-07-22 MED ORDER — HEMOSTATIC AGENTS (NO CHARGE) OPTIME
TOPICAL | Status: DC | PRN
  Administered 2016-07-22: 1 via TOPICAL

## 2016-07-22 MED ORDER — CHLORHEXIDINE GLUCONATE 0.12 % MT SOLN
15.0000 mL | OROMUCOSAL | Status: AC
  Administered 2016-07-22: 15 mL via OROMUCOSAL

## 2016-07-22 MED ORDER — METOPROLOL SUCCINATE ER 50 MG PO TB24: 50.0000 mg | ORAL_TABLET | Freq: Every day | ORAL | Status: DC

## 2016-07-22 MED ORDER — SODIUM CHLORIDE 0.9 % IV SOLN
INTRAVENOUS | Status: DC | PRN
  Administered 2016-07-22: 0.2 ug/kg/h via INTRAVENOUS

## 2016-07-22 MED ORDER — NITROGLYCERIN IN D5W 200-5 MCG/ML-% IV SOLN: 0.0000 ug/min | INTRAVENOUS | Status: DC

## 2016-07-22 MED ORDER — CHLORHEXIDINE GLUCONATE 0.12% ORAL RINSE (MEDLINE KIT)
15.0000 mL | Freq: Two times a day (BID) | OROMUCOSAL | Status: DC
Start: 2016-07-22 — End: 2016-07-23

## 2016-07-22 MED ORDER — PROTAMINE SULFATE 10 MG/ML IV SOLN
INTRAVENOUS | Status: DC | PRN
  Administered 2016-07-22: 90 mg via INTRAVENOUS
  Administered 2016-07-22: 100 mg via INTRAVENOUS
  Administered 2016-07-22: 10 mg via INTRAVENOUS
  Administered 2016-07-22: 90 mg via INTRAVENOUS

## 2016-07-22 MED ORDER — MIDAZOLAM HCL 10 MG/2ML IJ SOLN
INTRAMUSCULAR | Status: AC
  Filled 2016-07-22: qty 2

## 2016-07-22 MED ORDER — PANTOPRAZOLE SODIUM 40 MG PO TBEC
40.0000 mg | DELAYED_RELEASE_TABLET | Freq: Every day | ORAL | Status: DC
  Administered 2016-07-24 – 2016-07-27 (×4): 40 mg via ORAL
  Filled 2016-07-22 (×4): qty 1

## 2016-07-22 MED ORDER — MORPHINE SULFATE (PF) 2 MG/ML IV SOLN
2.0000 mg | INTRAVENOUS | Status: DC | PRN
  Administered 2016-07-23: 1 mg via INTRAVENOUS
  Administered 2016-07-23: 2 mg via INTRAVENOUS
  Filled 2016-07-22: qty 1

## 2016-07-22 MED ORDER — ONDANSETRON HCL 4 MG/2ML IJ SOLN: 4.0000 mg | Freq: Four times a day (QID) | INTRAMUSCULAR | Status: DC | PRN

## 2016-07-22 MED ORDER — ACETAMINOPHEN 650 MG RE SUPP
650.0000 mg | Freq: Once | RECTAL | Status: AC
  Administered 2016-07-22: 650 mg via RECTAL

## 2016-07-22 MED ORDER — ACETAMINOPHEN 160 MG/5ML PO SOLN
1000.0000 mg | Freq: Four times a day (QID) | ORAL | Status: DC
  Administered 2016-07-22: 1000 mg
  Filled 2016-07-22: qty 40.6

## 2016-07-22 MED ORDER — ROCURONIUM BROMIDE 100 MG/10ML IV SOLN
INTRAVENOUS | Status: DC | PRN
  Administered 2016-07-22 (×3): 50 mg via INTRAVENOUS
  Administered 2016-07-22: 100 mg via INTRAVENOUS

## 2016-07-22 MED ORDER — TRAMADOL HCL 50 MG PO TABS
50.0000 mg | ORAL_TABLET | ORAL | Status: DC | PRN
  Administered 2016-07-24 – 2016-07-25 (×2): 100 mg via ORAL
  Administered 2016-07-25: 50 mg via ORAL
  Administered 2016-07-25 – 2016-07-26 (×3): 100 mg via ORAL
  Filled 2016-07-22 (×4): qty 2
  Filled 2016-07-22: qty 1
  Filled 2016-07-22: qty 2

## 2016-07-22 MED ORDER — SUFENTANIL CITRATE 50 MCG/ML IV SOLN
INTRAVENOUS | Status: DC | PRN
  Administered 2016-07-22 (×3): 10 ug via INTRAVENOUS
  Administered 2016-07-22: 15 ug via INTRAVENOUS
  Administered 2016-07-22 (×3): 10 ug via INTRAVENOUS
  Administered 2016-07-22: 25 ug via INTRAVENOUS

## 2016-07-22 MED ORDER — SODIUM CHLORIDE 0.9% FLUSH: 3.0000 mL | INTRAVENOUS | Status: DC | PRN

## 2016-07-22 MED ORDER — GABAPENTIN 300 MG PO CAPS
600.0000 mg | ORAL_CAPSULE | Freq: Every day | ORAL | Status: DC
  Administered 2016-07-23 – 2016-07-26 (×4): 600 mg via ORAL
  Filled 2016-07-22 (×3): qty 2

## 2016-07-22 MED ORDER — SODIUM CHLORIDE 0.9 % IJ SOLN
INTRAMUSCULAR | Status: AC
  Filled 2016-07-22: qty 10

## 2016-07-22 MED ORDER — MIDAZOLAM HCL 2 MG/2ML IJ SOLN
INTRAMUSCULAR | Status: AC
  Administered 2016-07-22: 2 mg via INTRAVENOUS
  Filled 2016-07-22: qty 2

## 2016-07-22 MED ORDER — FENTANYL CITRATE (PF) 100 MCG/2ML IJ SOLN
INTRAMUSCULAR | Status: AC
  Administered 2016-07-22: 100 ug via INTRAVENOUS
  Filled 2016-07-22: qty 2

## 2016-07-22 MED ORDER — SODIUM CHLORIDE 0.9 % IV SOLN
1.5000 mg/kg/h | INTRAVENOUS | Status: DC
  Filled 2016-07-22: qty 25

## 2016-07-22 MED ORDER — MIDAZOLAM HCL 2 MG/2ML IJ SOLN: 2.0000 mg | INTRAMUSCULAR | Status: DC | PRN

## 2016-07-22 MED ORDER — FAMOTIDINE IN NACL 20-0.9 MG/50ML-% IV SOLN
20.0000 mg | Freq: Two times a day (BID) | INTRAVENOUS | Status: AC
  Administered 2016-07-22: 20 mg via INTRAVENOUS

## 2016-07-22 MED ORDER — SUFENTANIL CITRATE 50 MCG/ML IV SOLN
INTRAVENOUS | Status: AC
  Filled 2016-07-22: qty 1

## 2016-07-22 MED ORDER — LEVOFLOXACIN IN D5W 750 MG/150ML IV SOLN
750.0000 mg | INTRAVENOUS | Status: AC
  Administered 2016-07-23: 750 mg via INTRAVENOUS
  Filled 2016-07-22: qty 150

## 2016-07-22 MED ORDER — PROPOFOL 10 MG/ML IV BOLUS
INTRAVENOUS | Status: DC | PRN
  Administered 2016-07-22: 100 mg via INTRAVENOUS

## 2016-07-22 MED ORDER — DOCUSATE SODIUM 100 MG PO CAPS
200.0000 mg | ORAL_CAPSULE | Freq: Every day | ORAL | Status: DC
  Administered 2016-07-23 – 2016-07-27 (×5): 200 mg via ORAL
  Filled 2016-07-22 (×5): qty 2

## 2016-07-22 MED ORDER — SODIUM CHLORIDE 0.9 % IV SOLN: 250.0000 mL | INTRAVENOUS | Status: DC

## 2016-07-22 MED ORDER — PHENYLEPHRINE HCL 10 MG/ML IJ SOLN
INTRAMUSCULAR | Status: DC | PRN
  Administered 2016-07-22: 40 ug via INTRAVENOUS

## 2016-07-22 MED ORDER — CITALOPRAM HYDROBROMIDE 20 MG PO TABS
20.0000 mg | ORAL_TABLET | Freq: Every day | ORAL | Status: DC
  Administered 2016-07-23 – 2016-07-27 (×5): 20 mg via ORAL
  Filled 2016-07-22 (×5): qty 1

## 2016-07-22 MED ORDER — POTASSIUM CHLORIDE 10 MEQ/50ML IV SOLN
10.0000 meq | INTRAVENOUS | Status: AC
  Administered 2016-07-22 (×3): 10 meq via INTRAVENOUS

## 2016-07-22 MED ORDER — ALBUMIN HUMAN 5 % IV SOLN: 250.0000 mL | INTRAVENOUS | Status: AC | PRN

## 2016-07-22 MED ORDER — ORAL CARE MOUTH RINSE
15.0000 mL | OROMUCOSAL | Status: DC
  Administered 2016-07-22 – 2016-07-23 (×2): 15 mL via OROMUCOSAL

## 2016-07-22 MED ORDER — METOCLOPRAMIDE HCL 5 MG/ML IJ SOLN
10.0000 mg | Freq: Four times a day (QID) | INTRAMUSCULAR | Status: DC
  Administered 2016-07-22 – 2016-07-27 (×17): 10 mg via INTRAVENOUS
  Filled 2016-07-22 (×16): qty 2

## 2016-07-22 MED ORDER — SODIUM CHLORIDE 0.9 % IV SOLN
INTRAVENOUS | Status: DC
  Filled 2016-07-22: qty 2.5

## 2016-07-22 MED ORDER — MAGNESIUM SULFATE 4 GM/100ML IV SOLN
4.0000 g | Freq: Once | INTRAVENOUS | Status: AC
  Administered 2016-07-22: 4 g via INTRAVENOUS

## 2016-07-22 MED ORDER — BISACODYL 10 MG RE SUPP: 10.0000 mg | Freq: Every day | RECTAL | Status: DC

## 2016-07-22 SURGICAL SUPPLY — 95 items
ADAPTER CARDIO PERF ANTE/RETRO (ADAPTER) ×4 IMPLANT
BAG DECANTER FOR FLEXI CONT (MISCELLANEOUS) ×4 IMPLANT
BANDAGE ACE 4X5 VEL STRL LF (GAUZE/BANDAGES/DRESSINGS) ×4 IMPLANT
BANDAGE ACE 6X5 VEL STRL LF (GAUZE/BANDAGES/DRESSINGS) ×4 IMPLANT
BASKET HEART  (ORDER IN 25'S) (MISCELLANEOUS) ×1
BASKET HEART (ORDER IN 25'S) (MISCELLANEOUS) ×1
BASKET HEART (ORDER IN 25S) (MISCELLANEOUS) ×2 IMPLANT
BLADE STERNUM SYSTEM 6 (BLADE) ×4 IMPLANT
BLADE SURG 12 STRL SS (BLADE) ×4 IMPLANT
BLADE SURG ROTATE 9660 (MISCELLANEOUS) IMPLANT
BNDG GAUZE ELAST 4 BULKY (GAUZE/BANDAGES/DRESSINGS) ×4 IMPLANT
CANISTER SUCTION 2500CC (MISCELLANEOUS) ×4 IMPLANT
CANNULA GUNDRY RCSP 15FR (MISCELLANEOUS) ×4 IMPLANT
CATH CPB KIT VANTRIGT (MISCELLANEOUS) ×4 IMPLANT
CATH ROBINSON RED A/P 18FR (CATHETERS) ×12 IMPLANT
CATH THORACIC 36FR RT ANG (CATHETERS) ×4 IMPLANT
CLIP TI WIDE RED SMALL 24 (CLIP) ×8 IMPLANT
CRADLE DONUT ADULT HEAD (MISCELLANEOUS) ×4 IMPLANT
DRAIN CHANNEL 32F RND 10.7 FF (WOUND CARE) ×4 IMPLANT
DRAPE CARDIOVASCULAR INCISE (DRAPES) ×2
DRAPE SLUSH/WARMER DISC (DRAPES) ×4 IMPLANT
DRAPE SRG 135X102X78XABS (DRAPES) ×2 IMPLANT
DRSG AQUACEL AG ADV 3.5X14 (GAUZE/BANDAGES/DRESSINGS) ×4 IMPLANT
ELECT BLADE 4.0 EZ CLEAN MEGAD (MISCELLANEOUS) ×4
ELECT BLADE 6.5 EXT (BLADE) ×4 IMPLANT
ELECT CAUTERY BLADE 6.4 (BLADE) ×4 IMPLANT
ELECT REM PT RETURN 9FT ADLT (ELECTROSURGICAL) ×8
ELECTRODE BLDE 4.0 EZ CLN MEGD (MISCELLANEOUS) ×2 IMPLANT
ELECTRODE REM PT RTRN 9FT ADLT (ELECTROSURGICAL) ×4 IMPLANT
FELT TEFLON 1X6 (MISCELLANEOUS) ×8 IMPLANT
GAUZE SPONGE 4X4 12PLY STRL (GAUZE/BANDAGES/DRESSINGS) ×8 IMPLANT
GLOVE BIO SURGEON STRL SZ 6 (GLOVE) ×8 IMPLANT
GLOVE BIO SURGEON STRL SZ 6.5 (GLOVE) ×21 IMPLANT
GLOVE BIO SURGEON STRL SZ7.5 (GLOVE) ×12 IMPLANT
GLOVE BIO SURGEONS STRL SZ 6.5 (GLOVE) ×7
GOWN STRL REUS W/ TWL LRG LVL3 (GOWN DISPOSABLE) ×8 IMPLANT
GOWN STRL REUS W/TWL LRG LVL3 (GOWN DISPOSABLE) ×8
HEMOSTAT POWDER SURGIFOAM 1G (HEMOSTASIS) ×12 IMPLANT
HEMOSTAT SURGICEL 2X14 (HEMOSTASIS) ×4 IMPLANT
INSERT FOGARTY XLG (MISCELLANEOUS) IMPLANT
KIT BASIN OR (CUSTOM PROCEDURE TRAY) ×4 IMPLANT
KIT ROOM TURNOVER OR (KITS) ×4 IMPLANT
KIT SUCTION CATH 14FR (SUCTIONS) ×4 IMPLANT
KIT VASOVIEW HEMOPRO VH 3000 (KITS) ×4 IMPLANT
LEAD PACING MYOCARDI (MISCELLANEOUS) ×4 IMPLANT
MARKER GRAFT CORONARY BYPASS (MISCELLANEOUS) ×12 IMPLANT
NS IRRIG 1000ML POUR BTL (IV SOLUTION) ×20 IMPLANT
PACK OPEN HEART (CUSTOM PROCEDURE TRAY) ×4 IMPLANT
PAD ARMBOARD 7.5X6 YLW CONV (MISCELLANEOUS) ×8 IMPLANT
PAD ELECT DEFIB RADIOL ZOLL (MISCELLANEOUS) ×4 IMPLANT
PENCIL BUTTON HOLSTER BLD 10FT (ELECTRODE) ×4 IMPLANT
PUNCH AORTIC ROTATE  4.5MM 8IN (MISCELLANEOUS) ×4 IMPLANT
PUNCH AORTIC ROTATE 4.0MM (MISCELLANEOUS) IMPLANT
PUNCH AORTIC ROTATE 4.5MM 8IN (MISCELLANEOUS) IMPLANT
PUNCH AORTIC ROTATE 5MM 8IN (MISCELLANEOUS) IMPLANT
SET CARDIOPLEGIA MPS 5001102 (MISCELLANEOUS) ×4 IMPLANT
SPONGE GAUZE 4X4 12PLY STER LF (GAUZE/BANDAGES/DRESSINGS) ×8 IMPLANT
SPONGE LAP 18X18 X RAY DECT (DISPOSABLE) ×8 IMPLANT
SPONGE LAP 4X18 X RAY DECT (DISPOSABLE) ×4 IMPLANT
SURGIFLO W/THROMBIN 8M KIT (HEMOSTASIS) ×4 IMPLANT
SUT BONE WAX W31G (SUTURE) ×4 IMPLANT
SUT MNCRL AB 4-0 PS2 18 (SUTURE) IMPLANT
SUT PROLENE 3 0 SH DA (SUTURE) IMPLANT
SUT PROLENE 3 0 SH1 36 (SUTURE) IMPLANT
SUT PROLENE 4 0 RB 1 (SUTURE) ×2
SUT PROLENE 4 0 SH DA (SUTURE) ×24 IMPLANT
SUT PROLENE 4-0 RB1 .5 CRCL 36 (SUTURE) ×2 IMPLANT
SUT PROLENE 5 0 C 1 36 (SUTURE) IMPLANT
SUT PROLENE 6 0 C 1 30 (SUTURE) ×32 IMPLANT
SUT PROLENE 6 0 CC (SUTURE) ×16 IMPLANT
SUT PROLENE 7 0 BV 1 (SUTURE) ×4 IMPLANT
SUT PROLENE 8 0 BV175 6 (SUTURE) ×8 IMPLANT
SUT PROLENE BLUE 7 0 (SUTURE) ×4 IMPLANT
SUT SILK  1 MH (SUTURE)
SUT SILK 1 MH (SUTURE) IMPLANT
SUT SILK 2 0 SH CR/8 (SUTURE) ×4 IMPLANT
SUT SILK 3 0 SH CR/8 (SUTURE) ×4 IMPLANT
SUT STEEL 6MS V (SUTURE) ×8 IMPLANT
SUT STEEL SZ 6 DBL 3X14 BALL (SUTURE) ×4 IMPLANT
SUT VIC AB 1 CTX 36 (SUTURE) ×6
SUT VIC AB 1 CTX36XBRD ANBCTR (SUTURE) ×6 IMPLANT
SUT VIC AB 2-0 CT1 27 (SUTURE) ×2
SUT VIC AB 2-0 CT1 TAPERPNT 27 (SUTURE) ×2 IMPLANT
SUT VIC AB 2-0 CTX 27 (SUTURE) IMPLANT
SUT VIC AB 3-0 X1 27 (SUTURE) ×4 IMPLANT
SUTURE E-PAK OPEN HEART (SUTURE) ×4 IMPLANT
SYSTEM SAHARA CHEST DRAIN ATS (WOUND CARE) ×4 IMPLANT
TAPE CLOTH SURG 4X10 WHT LF (GAUZE/BANDAGES/DRESSINGS) ×8 IMPLANT
TOWEL OR 17X24 6PK STRL BLUE (TOWEL DISPOSABLE) ×8 IMPLANT
TOWEL OR 17X26 10 PK STRL BLUE (TOWEL DISPOSABLE) ×8 IMPLANT
TRAY FOLEY IC TEMP SENS 16FR (CATHETERS) ×4 IMPLANT
TUBE SUCT INTRACARD DLP 20F (MISCELLANEOUS) ×4 IMPLANT
TUBING INSUFFLATION (TUBING) ×4 IMPLANT
UNDERPAD 30X30 (UNDERPADS AND DIAPERS) ×4 IMPLANT
WATER STERILE IRR 1000ML POUR (IV SOLUTION) ×8 IMPLANT

## 2016-07-22 NOTE — Assessment & Plan Note (Signed)
Chronic diastolic heart failure now exacerbated with MI. EDP was of a 28 mmHg.  We'll need to add afterload reduction - was on both ACE inhibitor and ARB at home - currently on hold Furosemide d/c'd for surgery

## 2016-07-22 NOTE — CV Procedure (Signed)
Intra-operative Transesophageal Echocardiography Report:  [name] [name] is a 60 year old female with a history of sinus node dysfunction (status post dual-chamber pacemaker placement), type 2 diabetes, hypertension, obesity, and hyperlipidemia who was admitted to [facility]  on 07/20/2016 with chest pain and dyspnea. Her cardiac enzymes were positive and she underwent cardiac catheterization which revealed severe multivessel coronary disease with normal LV systolic function. The left circumflex coronary artery was occluded with collaterals from right to left. She was then scheduled to undergo coronary artery bypass grafting by Dr. Tharon Aquas Tigt. Intra-operative transesophageal echocardiography was indicated to evaluate the right and left ventricular function, to serve as a monitor for intraoperative volume status and cardiac function, and to assess for any valvular pathology,.  The patient was brought to the operating room at [facility] and general anesthesia was induced without difficulty. The transesophageal echocardiography probe was inserted by Dr. Everardo Beals.  Impression: Pre-bypass findings:  1. Aortic valve: The aortic valve was trileaflet. The leaflets were thin and pliable and opened normally without restriction. There was no aortic insufficiency noted.  2. Mitral valve: The mitral leaflets opened normally without restriction. There was normal coaptation of the leaflets without prolapsing or fluttering segments. There was trace mitral insufficiency.  3. Left ventricle: The left ventricular cavity diameter was at the upper limits of normal in size. Left ventricular end-diastolic diameter was 123XX123 cm at end diastole at the mid-papillary level in the transgastric short axis view. There  was normal appearing systolic function in all segments interrogated. The LV ejection fraction was estimated at 60-65%. There was moderate concentric left ventricular hypertrophy. Left ventricular  wall thickness was 1.10 at the inferior wall and 1.08 at the anterior wall at end diastole in the mid-papillary level transgastric short axis view. There was a prominent localized subaortic region of LV hypertrophy along the interventricular septum. However, there was no evidence of left ventricular outflow tract obstruction.  4. Right ventricle: The right ventricular size with within normal limits. There was good contract contractility of the right ventricular free wall and normal appearing right ventricular systolic function.  5. Tricuspid valve: The tricuspid valve appeared structurally normal and there was trace tricuspid insufficiency.  6. Ascending aorta: There was a well-defined aortic root and sinotubular ridge without dilatation or effacement. The walls of the ascending aorta were mildly thickened but there was no protruding atheromatous disease noted.  7. Descending aorta: The descending aorta measured 2.59 cm in diameter and there was mild thickening of the wall the descending aorta but no protruding atheromatous disease noted.  Post-bypass findings:  1. Aortic valve: The aortic valve appeared unchanged from the pre-bypass study. The leaflets opened normally without restriction and there was no aortic insufficiency.  2. Mitral valve: The mitral leaflets opened normally with good leaflet separation and no restriction to opening. There was trace aortic mitral insufficiency but no evidence of prolapsing or flail segments noted.  3. Left ventricle: There was normal left ventricular systolic function noted. There was good contractility in all segments interrogated the estimated ejection fraction was 60-65%..   4. Right ventricle: The right ventricular cavity was normal normal in size. There was normal appearing right ventriclar  systolic function.   5. Tricuspid valve: The tricuspid valve appeared unchanged from the pre-bypass study. There was trace tricuspid insufficiency. The leaflets  appeared normal.  6. Interatrial septum: There was no evidence of patent foramen ovale or atrial septal defect by color Doppler.  Roberts Gaudy, M.D.

## 2016-07-22 NOTE — Progress Notes (Signed)
Subjective/Objective   Patient Name: [name] [name] Date of Encounter: 07/22/2016   Patient Care Team: Barton Fanny, MD as PCP - General (Family Medicine) Bryson Ha Himmelrich, RD as Dietitian Deboraha Sprang, MD as Attending Physician (Cardiology) Marygrace Drought, MD as Consulting Physician (Ophthalmology) Vickey Huger, MD as Consulting Physician (Orthopedic Surgery)   Hospital Problem List     Principal Problem:   STEMI involving left circumflex coronary artery [facility]) Active Problems:   Multiple vessel coronary artery disease   Severe essential hypertension   DIASTOLIC HEART FAILURE, CHRONIC   OSA (obstructive sleep apnea)   Diabetes mellitus type 2, uncontrolled, with complications ([place])   Hyperlipidemia   Pacemaker Medtronic    Subjective   Resting comfortably this AM. Did start having a little chest heaviness if up out of bed.  Better in bed & with NTG. BP somewhat low. Slept well last night. No orthopnea or PND.  Inpatient Medications    . aspirin EC  325 mg Oral Daily  . atorvastatin  80 mg Oral q1800  . chlorhexidine  60 mL Topical Once  . citalopram  20 mg Oral Daily  . dexmedetomidine  0.1-0.7 mcg/kg/hr Intravenous To OR  . diazepam  5 mg Oral Once  . dicyclomine  20 mg Oral TID  . DOPamine  0-10 mcg/kg/min Intravenous To OR  . epinephrine  0-10 mcg/min Intravenous To OR  . fluticasone  2 spray Each Nare Daily  . gabapentin  300 mg Oral q morning - 10a  . gabapentin  600 mg Oral QHS  . glimepiride  2 mg Oral Q breakfast  . heparin-papaverine-plasmalyte irrigation   Irrigation To OR  . heparin 30,000 units/NS 1000 mL solution for CELLSAVER   Other To OR  . insulin aspart  0-20 Units Subcutaneous TID WC  . insulin glargine  10 Units Subcutaneous BID  . insulin (NOVOLIN-R) infusion   Intravenous To OR  . levofloxacin (LEVAQUIN) IV  500 mg Intravenous To OR  . loratadine  10 mg Oral Daily  . magnesium sulfate  40 mEq Other To OR  . metoprolol succinate   50 mg Oral Daily  . nitroGLYCERIN  2-200 mcg/min Intravenous To OR  . pantoprazole  40 mg Oral Daily  . phenylephrine (NEO-SYNEPHRINE) Adult infusion  30-200 mcg/min Intravenous To OR  . potassium chloride  80 mEq Other To OR  . sodium chloride flush  3 mL Intravenous Q12H  . tranexamic acid (CYKLOKAPRON) infusion ([facility])  1.5 mg/kg/hr Intravenous To OR  . tranexamic acid  15 mg/kg Intravenous To OR  . tranexamic acid  2 mg/kg Intracatheter To OR  . vancomycin  1,500 mg Intravenous To OR    Vital Signs    Vitals:   07/22/16 0400 07/22/16 0500 07/22/16 0600 07/22/16 0700  BP: 125/73 114/61 111/62 (!) 90/56  Pulse: 76 69 76 74  Resp: 18 16 16 15   Temp:      TempSrc:      SpO2: 93% 92% 94% 94%  Weight:      Height:        Intake/Output Summary (Last 24 hours) at 07/22/16 0750 Last data filed at 07/22/16 0700  Gross per 24 hour  Intake          1321.08 ml  Output             2675 ml  Net         -1353.92 ml   Filed Weights   07/20/16 2000  Weight:  108.4 kg (238 lb 15.7 oz)    Physical Exam   GEN: Well nourished, well developed, in no acute distress. Obese HEENT: Grossly normal.  Neck: Supple, no JVD,  Cardiac: RRR, distant S1 and S2. no murmurs, rubs, or gallops. No clubbing, cyanosis, edema.  Radials/DP/PT 2+ and equal bilaterally.  Respiratory:  Respirations regular and unlabored, clear to auscultation bilaterally. GI: Soft, nontender, nondistended, BS + x 4. MS: no deformity or atrophy.; Radial cath site C/D/I Skin: warm and dry, no rash. Psych: AAOx3.  Normal affect.  Labs    CBC  Recent Labs  07/20/16 1740 07/21/16 0420 07/22/16 0526  WBC 7.5 7.6 8.5  NEUTROABS 5.6  --   --   HGB 13.0 13.2 12.5  HCT 37.5 38.4 35.7*  MCV 88.2 88.5 88.6  PLT 205 186 [id number]   Basic Metabolic Panel  Recent Labs  07/21/16 0420 07/22/16 0526  NA 139 134*  K 3.8 4.0  CL 103 98*  CO2 26 27  GLUCOSE 125* 175*  BUN 14 14  CREATININE 0.92 0.96  CALCIUM 9.7  9.6 9.3    Liver Function Tests  Recent Labs  07/20/16 0956 07/21/16 0420  AST 23 68*  ALT 20 28  ALKPHOS 68 55  BILITOT 0.5 1.0  PROT 7.3 7.0  ALBUMIN 4.6 3.9   No results for input(s): LIPASE, AMYLASE in the last 72 hours. Cardiac Enzymes  Recent Labs  07/21/16 1649 07/21/16 2336 07/22/16 0526  TROPONINI 6.41* 7.19* 4.13*   BNP Invalid input(s): POCBNP D-Dimer No results for input(s): DDIMER in the last 72 hours. Hemoglobin A1C  Recent Labs  07/20/16 1740  HGBA1C 7.1*   Fasting Lipid Panel  Recent Labs  07/21/16 0537  CHOL 254*  HDL 39*  LDLCALC 164*  TRIG 255*  CHOLHDL 6.5   Thyroid Function Tests No results for input(s): TSH, T4TOTAL, T3FREE, THYROIDAB in the last 72 hours.  Invalid input(s): FREET3  Telemetry    Sinus rhythm in the 70s  ECG    Normal sinus rhythm 67 BPM. Nonspecific ST and T-wave changes.   Cardiology   CARDIAC CATH 07/20/2016 FINDINGS  Ost 1st Sept to 1st Sept lesion, 90 %stenosed.    Prox LAD lesion, 90 %stenosed.  Mid LAD lesion, 80 %stenosed.  Dist LAD lesion, 70 %stenosed.  Ost Cx to Prox Cx lesion, 100 %stenosed. - Fills via right to left collaterals  2nd RPLB lesion, 100 %stenosed.  The left ventricular systolic function is normal.  The left ventricular ejection fraction is 50-55% by visual estimate.  LV end diastolic pressure is moderately elevated.   CONCLUSION 1. Severe 3 vessel obstructive CAD.    - there is complex proximal to mid LAD disease at the takeoff of a large septal perforator and 2 large diagonal branches.    - 100% occlusion of the proximal LCx- culprit vessel. This fills by right to left collaterals.    -100% occlusion of a small PL branch of the RCA that fills by left to right collaterals. 2. Overall good LV function 3. Moderately elevated LVEDP  PLAN: patient is currently pain free. Will transfer to ICU and continue IV Ntg and heparin. The complex LAD disease make it unfavorable to treat her  with PCI. Would recommend CT surgery consult for CABG.    Radiology    Dg Chest 2 View  Result Date: 07/20/2016 CLINICAL DATA:  SOB x 1 week EXAM: CHEST - 2 VIEW COMPARISON:  06/27/2014 FINDINGS: Stable left subclavian pacemaker.  No pneumothorax.  Lungs clear. Heart size and mediastinal contours are within normal limits. No effusion. Visualized bones unremarkable. IMPRESSION: No acute cardiopulmonary disease. Electronically Signed   By: Lucrezia Europe M.D.   On: 07/20/2016 10:09    Patient Profile     [name] [name] is a 60 y.o. female with past medical history of sinus node dysfunction (s/p PPM placement), Type 2 DM, HTN, and HLD who presents to [facility] ED on 07/20/2016 for evaluation of chest pain and dyspnea.  She was noted to have STE in V2 & V3 with initial Troponin of 0.15.  With ST elevation on EKG and continued chest discomfort, she was brought to the cardiac catheterization lab for an emergent Cardiac catheterization which revealed multivessel CAD.   Assessment & Plan      Scheduled Meds: . aspirin EC  325 mg Oral Daily  . atorvastatin  80 mg Oral q1800  . chlorhexidine  60 mL Topical Once  . citalopram  20 mg Oral Daily  . dexmedetomidine  0.1-0.7 mcg/kg/hr Intravenous To OR  . diazepam  5 mg Oral Once  . dicyclomine  20 mg Oral TID  . DOPamine  0-10 mcg/kg/min Intravenous To OR  . epinephrine  0-10 mcg/min Intravenous To OR  . fluticasone  2 spray Each Nare Daily  . gabapentin  300 mg Oral q morning - 10a  . gabapentin  600 mg Oral QHS  . glimepiride  2 mg Oral Q breakfast  . heparin-papaverine-plasmalyte irrigation   Irrigation To OR  . heparin 30,000 units/NS 1000 mL solution for CELLSAVER   Other To OR  . insulin aspart  0-20 Units Subcutaneous TID WC  . insulin glargine  10 Units Subcutaneous BID  . insulin (NOVOLIN-R) infusion   Intravenous To OR  . levofloxacin (LEVAQUIN) IV  500 mg Intravenous To OR  . loratadine  10 mg Oral Daily  . magnesium sulfate  40 mEq  Other To OR  . metoprolol succinate  50 mg Oral Daily  . nitroGLYCERIN  2-200 mcg/min Intravenous To OR  . pantoprazole  40 mg Oral Daily  . phenylephrine (NEO-SYNEPHRINE) Adult infusion  30-200 mcg/min Intravenous To OR  . potassium chloride  80 mEq Other To OR  . sodium chloride flush  3 mL Intravenous Q12H  . tranexamic acid (CYKLOKAPRON) infusion ([facility])  1.5 mg/kg/hr Intravenous To OR  . tranexamic acid  15 mg/kg Intravenous To OR  . tranexamic acid  2 mg/kg Intracatheter To OR  . vancomycin  1,500 mg Intravenous To OR   Continuous Infusions: . heparin 1,600 Units/hr (07/21/16 2157)  . nitroGLYCERIN 40 mcg/min (07/22/16 0300)   PRN Meds:sodium chloride, acetaminophen, albuterol, ALPRAZolam, morphine injection, nitroGLYCERIN, ondansetron (ZOFRAN) IV, sodium chloride flush, temazepam  Vital signs in last 24 hours: Temp:  [97.9 F (36.6 C)-99.3 F (37.4 C)] 99.3 F (37.4 C) (10/12 0300) Pulse Rate:  [63-90] 74 (10/12 0700) Resp:  [13-34] 15 (10/12 0700) BP: (90-194)/(50-116) 90/56 (10/12 0700) SpO2:  [87 %-99 %] 94 % (10/12 0700)  Intake/Output last 3 shifts: I/O last 3 completed shifts: In: 1761.3 [P.O.:480; I.V.:1281.3] Out: [id number] [Urine:4075] Intake/Output this shift: No intake/output data recorded.  Problem Assessment/Plan Cardiovascular and Mediastinum  DIASTOLIC HEART FAILURE, CHRONIC  Assessment & Plan   Chronic diastolic heart failure now exacerbated with MI. EDP was of a 28 mmHg.  We'll need to add afterload reduction - was on both ACE inhibitor and ARB at home - currently on hold Furosemide d/c'd for surgery  Severe essential hypertension  Assessment & Plan   Blood pressure now in 90s on NTG gtt. --  Will reduce home dose Toprol 100 mg daily to 50mg  for today. Was on both ACE-I & ARB @ home -- both on hold for now while on NTG gtt -- may require 1 of the 2 post-OP.      * STEMI involving left circumflex coronary artery [facility])  Assessment & Plan   ST  segments seemed to resolved. Occluded circumflex noted with collaterals from right to left. Plan:2nd Case CABG today  - Dr. Tharon Aquas Trigt  Heparin infusion.   Nitroglycerin drip, as she has been having chest pain - but will try to wean down to allow BP to give BB dose.  High-dose statin, aspirin,   Was on Toprol 100 mg daily, but Lopressor 12.5 mg BID ordered by CT Sgx. -- will reduce Toprol dose to 50 mg for today given low BP today.  Holding ACE-I      Endocrine  Diabetes mellitus type 2, uncontrolled, with complications ([place])  Assessment & Plan   Currently on Amaryl along with sliding scale insulin. Determine requirements for long-acting insulin if necessary.  - Hold oral hypoglycemic today. Was written for Lantus 10 mg BID -- will need to determine if this should be given while NPO for surgery. - Defer to CT Surgery     Other  Hyperlipidemia  Assessment & Plan   Check fasting lipid panel prior to discharge. High-dose statin in setting of MI.

## 2016-07-22 NOTE — Anesthesia Procedure Notes (Signed)
Procedure Name: Intubation Date/Time: 07/22/2016 1:46 PM Performed by: Trixie Deis A Pre-anesthesia Checklist: Patient identified, Emergency Drugs available, Suction available, Patient being monitored and Timeout performed Patient Re-evaluated:Patient Re-evaluated prior to inductionOxygen Delivery Method: Circle system utilized Preoxygenation: Pre-oxygenation with 100% oxygen Intubation Type: IV induction Ventilation: Mask ventilation without difficulty Laryngoscope Size: Mac and 3 Grade View: Grade I Tube type: Oral Tube size: 8.0 mm Number of attempts: 1 Airway Equipment and Method: Stylet Placement Confirmation: ETT inserted through vocal cords under direct vision,  positive ETCO2 and breath sounds checked- equal and bilateral Secured at: 23 cm Tube secured with: Tape Dental Injury: Teeth and Oropharynx as per pre-operative assessment

## 2016-07-22 NOTE — Assessment & Plan Note (Signed)
Currently on Amaryl along with sliding scale insulin. Determine requirements for long-acting insulin if necessary.  - Hold oral hypoglycemic today. Was written for Lantus 10 mg BID -- will need to determine if this should be given while NPO for surgery. - Defer to CT Surgery

## 2016-07-22 NOTE — Addendum Note (Signed)
Addendum  created 07/22/16 2333 by Roberts Gaudy, MD   Sign clinical note

## 2016-07-22 NOTE — Brief Op Note (Signed)
07/20/2016 - 07/22/2016  5:47 PM  PATIENT:  [name] [name]  60 y.o. female  PRE-OPERATIVE DIAGNOSIS:  CAD  POST-OPERATIVE DIAGNOSIS:  CAD  PROCEDURE:  Procedure(s): CORONARY ARTERY BYPASS GRAFTING (CABG) x three , using left internal mammary artery and right leg greater saphenous vein harvested endoscopically (N/A) TRANSESOPHAGEAL ECHOCARDIOGRAM (TEE) (N/A)  SVG to Om1 SVG to Diag 1 LIMA to LAD  SURGEON:  Surgeon(s) and Role:    * Ivin Poot, MD - Primary  PHYSICIAN ASSISTANT:  Jadene Pierini, PA-C Nicholes Rough, PA-C  ANESTHESIA:   general  EBL:  Total I/O In: [id number] [I.V.:1344] Out: 1785 [Urine:1785]  BLOOD ADMINISTERED:none  DRAINS: routine   LOCAL MEDICATIONS USED:  NONE  SPECIMEN:  No Specimen  DISPOSITION OF SPECIMEN:  N/A  COUNTS:  YES  TOURNIQUET:  * No tourniquets in log *  DICTATION: .Other Dictation: Dictation Number pending  PLAN OF CARE: Admit to inpatient   PATIENT DISPOSITION:  ICU - intubated and hemodynamically stable.   Delay start of Pharmacological VTE agent (>24hrs) due to surgical blood loss or risk of bleeding: yes

## 2016-07-22 NOTE — OR Nursing (Signed)
18:45 - 45 minute call to SICU charge nurse, 19:20 - 20 minute call to SICU charge

## 2016-07-22 NOTE — Progress Notes (Signed)
2 Days Post-Op Procedure(s) (LRB): Left Heart Cath and Coronary Angiography (N/A) Subjective: Having chest pain with minimal activity Plan CABG later today Glucose better controlled  Objective: Vital signs in last 24 hours: Temp:  [97.9 F (36.6 C)-99.3 F (37.4 C)] 99.3 F (37.4 C) (10/12 0300) Pulse Rate:  [63-90] 74 (10/12 0700) Cardiac Rhythm: Normal sinus rhythm (10/12 0400) Resp:  [13-34] 15 (10/12 0700) BP: (90-194)/(50-116) 90/56 (10/12 0700) SpO2:  [87 %-99 %] 94 % (10/12 0700)  Hemodynamic parameters for last 24 hours:    Intake/Output from previous day: 10/11 0701 - 10/12 0700 In: 1321.1 [P.O.:480; I.V.:841.1] Out: 2675 [Urine:2675] Intake/Output this shift: No intake/output data recorded.    Lab Results:  Recent Labs  07/21/16 0420 07/22/16 0526  WBC 7.6 8.5  HGB 13.2 12.5  HCT 38.4 35.7*  PLT 186 187   BMET:  Recent Labs  07/21/16 0420 07/22/16 0526  NA 139 134*  K 3.8 4.0  CL 103 98*  CO2 26 27  GLUCOSE 125* 175*  BUN 14 14  CREATININE 0.92 0.96  CALCIUM 9.7  9.6 9.3    PT/INR: No results for input(s): LABPROT, INR in the last 72 hours. ABG    Component Value Date/Time   PHART 7.436 07/21/2016 1801   HCO3 24.5 07/21/2016 1801   TCO2 26 07/21/2016 1801   O2SAT 92.0 07/21/2016 1801   CBG (last 3)   Recent Labs  07/21/16 1202 07/21/16 1551 07/21/16 2112  GLUCAP 260* 188* 137*    Assessment/Plan: S/P Procedure(s) (LRB): Left Heart Cath and Coronary Angiography (N/A) CABG today Echo looks satisfactory Hold lantus today  LOS: 2 days    Tharon Aquas Trigt III 07/22/2016

## 2016-07-22 NOTE — Assessment & Plan Note (Signed)
Blood pressure now in 90s on NTG gtt. --  Will reduce home dose Toprol 100 mg daily to 50mg  for today. Was on both ACE-I & ARB @ home -- both on hold for now while on NTG gtt -- may require 1 of the 2 post-OP.

## 2016-07-22 NOTE — Anesthesia Preprocedure Evaluation (Addendum)
Anesthesia Evaluation  Patient identified by MRN, date of birth, ID band Patient awake    Reviewed: Allergy & Precautions, NPO status , Patient's Chart, lab work & pertinent test results, reviewed documented beta blocker date and time   History of Anesthesia Complications Negative for: history of anesthetic complications  Airway Mallampati: II  TM Distance: >3 FB Neck ROM: Full    Dental  (+) Teeth Intact, Dental Advisory Given, Caps, Missing   Pulmonary asthma , sleep apnea (9cm H2O) and Continuous Positive Airway Pressure Ventilation , former smoker,    Pulmonary exam normal breath sounds clear to auscultation       Cardiovascular hypertension, Pt. on medications and Pt. on home beta blockers + angina + CAD and + Past MI  Normal cardiovascular exam+ pacemaker  Rhythm:Regular Rate:Normal  Cath: Ost 1st Sept to 1st Sept lesion, 90 %stenosed. Prox LAD lesion, 90 %stenosed. Mid LAD lesion, 80 %stenosed. Dist LAD lesion, 70 %stenosed. Ost Cx to Prox Cx lesion, 100 %stenosed. 2nd RPLB lesion, 100 %stenosed. The left ventricular systolic function is normal. LV end diastolic pressure is moderately elevated. The left ventricular ejection fraction is 50-55% by visual estimate. -The left ventricular size is normal. The left ventricular systolic function is normal. LV end diastolic pressure is moderately elevated. The left ventricular ejection fraction is 50-55% by visual estimate. There are LV function abnormalities due to segmental dysfunction. Mild mid anterior hypokinesis.   Neuro/Psych  Headaches, PSYCHIATRIC DISORDERS Anxiety Depression    GI/Hepatic negative GI ROS, Neg liver ROS,   Endo/Other  diabetes, Type 2, Oral Hypoglycemic Agents  Renal/GU negative Renal ROS     Musculoskeletal  (+) Arthritis ,   Abdominal   Peds  Hematology  (+) Blood dyscrasia (Heparin), ,   Anesthesia Other Findings Day of surgery  medications reviewed with the patient.  Reproductive/Obstetrics                            Anesthesia Physical Anesthesia Plan  ASA: IV  Anesthesia Plan: General   Post-op Pain Management:    Induction: Intravenous  Airway Management Planned: Oral ETT  Additional Equipment: Arterial line, CVP, Ultrasound Guidance Line Placement, PA Cath and TEE  Intra-op Plan:   Post-operative Plan: Post-operative intubation/ventilation  Informed Consent: I have reviewed the patients History and Physical, chart, labs and discussed the procedure including the risks, benefits and alternatives for the proposed anesthesia with the patient or authorized representative who has indicated his/her understanding and acceptance.   Dental advisory given  Plan Discussed with: CRNA  Anesthesia Plan Comments: (Risks/benefits of general anesthesia discussed with patient including risk of damage to teeth, lips, gum, and tongue, nausea/vomiting, allergic reactions to medications, and the possibility of heart attack, stroke and death.  All patient questions answered.  Patient wishes to proceed.)        Anesthesia Quick Evaluation

## 2016-07-22 NOTE — Progress Notes (Signed)
Roy for heparin  Indication: chest pain/ACS  / CABG 10/12  Allergies  Allergen Reactions  . Adhesive [Tape] Other (See Comments)    SKIN BLISTERS  . Penicillins     Swelling   . Red Yeast Rice Hives  . Sulfa Drugs Cross Reactors Hives    Patient Measurements: Heparin dosing wt: 76.4kg  Vital Signs: Temp: 99.3 F (37.4 C) (10/12 0300) Temp Source: Oral (10/12 0300) BP: 90/56 (10/12 0700) Pulse Rate: 74 (10/12 0700)  Labs:  Recent Labs  07/20/16 1407 07/20/16 1740  07/21/16 0420 07/21/16 0857  07/21/16 1649 07/21/16 2336 07/22/16 0526  HGB 14.2 13.0  --  13.2  --   --   --   --  12.5  HCT 40.6 37.5  --  38.4  --   --   --   --  35.7*  PLT 212 205  --  186  --   --   --   --  187  HEPARINUNFRC  --   --   --   --  0.16*  --  0.20*  --  0.42  CREATININE 0.96  --   --  0.92  --   --   --   --  0.96  TROPONINI  --  1.18*  < > 6.39*  --   < > 6.41* 7.19* 4.13*  < > = values in this interval not displayed.  Estimated Creatinine Clearance: 70.4 mL/min (by C-G formula based on SCr of 0.96 mg/dL).   Medical History: Past Medical History:  Diagnosis Date  . Allergy   . Anal fissure   . Anxiety   . Arthritis   . Asthma   . Cataract 2010    left  . Colon polyps    diverticulosis  03-2011/2005  . Depression   . Diabetes mellitus 2012   T2DM  . Diverticulosis   . Fatty liver 04/2011  . GERD (gastroesophageal reflux disease)   . Hyperlipidemia   . Hypertension severe   . Migraine   . Obesity   . OSA (obstructive sleep apnea)    C-Pap  . Pacemaker Medtronic   . Sinoatrial node dysfunction (HCC)    syncope   Assessment: 60 yo female s/p STEMI with severe 3VCAD for CABG on 10/12. Pharmacy consulted to dose heparin.   HL therapeutic x 1 at 0.42 this AM.   Goal of Therapy:  Heparin level 0.3-0.7 units/ml Monitor platelets by anticoagulation protocol: Yes   Plan:  -Continue heparin at 1600 units/h -CABG planned  today - Follow up Heparin level after CABG -Monitor for s/sx bleeding, CBC, heparin level daily  Carlean Jews, Pharm.D. PGY1 Pharmacy Resident 10/12/20177:58 AM Pager [phone]

## 2016-07-22 NOTE — Assessment & Plan Note (Signed)
Check fasting lipid panel prior to discharge. High-dose statin in setting of MI.

## 2016-07-22 NOTE — Anesthesia Procedure Notes (Signed)
Central Venous Catheter Insertion Performed by: anesthesiologist Patient location: Pre-op. Preanesthetic checklist: patient identified, IV checked, site marked, risks and benefits discussed, surgical consent, monitors and equipment checked, pre-op evaluation, timeout performed and anesthesia consent Landmarks identified PA cath was placed.Swan type and PA catheter depth:thermodilation and 48PA Cath depth:48 Procedure performed using ultrasound guided technique. Attempts: 1 Patient tolerated the procedure well with no immediate complications.

## 2016-07-22 NOTE — Subjective & Objective (Signed)
Patient Name: [name] [name] Date of Encounter: 07/22/2016   Patient Care Team: Barton Fanny, MD as PCP - General (Family Medicine) Bryson Ha Himmelrich, RD as Dietitian Deboraha Sprang, MD as Attending Physician (Cardiology) Marygrace Drought, MD as Consulting Physician (Ophthalmology) Vickey Huger, MD as Consulting Physician (Orthopedic Surgery)   Hospital Problem List     Principal Problem:   STEMI involving left circumflex coronary artery [facility]) Active Problems:   Multiple vessel coronary artery disease   Severe essential hypertension   DIASTOLIC HEART FAILURE, CHRONIC   OSA (obstructive sleep apnea)   Diabetes mellitus type 2, uncontrolled, with complications ([place])   Hyperlipidemia   Pacemaker [employer]    Subjective   Resting comfortably this AM. Did start having a little chest heaviness if up out of bed.  Better in bed & with NTG. BP somewhat low. Slept well last night. No orthopnea or PND.  Inpatient Medications    . aspirin EC  325 mg Oral Daily  . atorvastatin  80 mg Oral q1800  . chlorhexidine  60 mL Topical Once  . citalopram  20 mg Oral Daily  . dexmedetomidine  0.1-0.7 mcg/kg/hr Intravenous To OR  . diazepam  5 mg Oral Once  . dicyclomine  20 mg Oral TID  . DOPamine  0-10 mcg/kg/min Intravenous To OR  . epinephrine  0-10 mcg/min Intravenous To OR  . fluticasone  2 spray Each Nare Daily  . gabapentin  300 mg Oral q morning - 10a  . gabapentin  600 mg Oral QHS  . glimepiride  2 mg Oral Q breakfast  . heparin-papaverine-plasmalyte irrigation   Irrigation To OR  . heparin 30,000 units/NS 1000 mL solution for CELLSAVER   Other To OR  . insulin aspart  0-20 Units Subcutaneous TID WC  . insulin glargine  10 Units Subcutaneous BID  . insulin (NOVOLIN-R) infusion   Intravenous To OR  . levofloxacin (LEVAQUIN) IV  500 mg Intravenous To OR  . loratadine  10 mg Oral Daily  . magnesium sulfate  40 mEq Other To OR  . metoprolol succinate  50 mg Oral Daily  .  nitroGLYCERIN  2-200 mcg/min Intravenous To OR  . pantoprazole  40 mg Oral Daily  . phenylephrine (NEO-SYNEPHRINE) Adult infusion  30-200 mcg/min Intravenous To OR  . potassium chloride  80 mEq Other To OR  . sodium chloride flush  3 mL Intravenous Q12H  . tranexamic acid (CYKLOKAPRON) infusion ([facility])  1.5 mg/kg/hr Intravenous To OR  . tranexamic acid  15 mg/kg Intravenous To OR  . tranexamic acid  2 mg/kg Intracatheter To OR  . vancomycin  1,500 mg Intravenous To OR    Vital Signs    Vitals:   07/22/16 0400 07/22/16 0500 07/22/16 0600 07/22/16 0700  BP: 125/73 114/61 111/62 (!) 90/56  Pulse: 76 69 76 74  Resp: 18 16 16 15   Temp:      TempSrc:      SpO2: 93% 92% 94% 94%  Weight:      Height:        Intake/Output Summary (Last 24 hours) at 07/22/16 0750 Last data filed at 07/22/16 0700  Gross per 24 hour  Intake          1321.08 ml  Output             2675 ml  Net         -1353.92 ml   Filed Weights   07/20/16 2000  Weight: 108.4 kg (  238 lb 15.7 oz)    Physical Exam   GEN: Well nourished, well developed, in no acute distress. Obese HEENT: Grossly normal.  Neck: Supple, no JVD,  Cardiac: RRR, distant S1 and S2. no murmurs, rubs, or gallops. No clubbing, cyanosis, edema.  Radials/DP/PT 2+ and equal bilaterally.  Respiratory:  Respirations regular and unlabored, clear to auscultation bilaterally. GI: Soft, nontender, nondistended, BS + x 4. MS: no deformity or atrophy.; Radial cath site C/D/I Skin: warm and dry, no rash. Psych: AAOx3.  Normal affect.  Labs    CBC  Recent Labs  07/20/16 1740 07/21/16 0420 07/22/16 0526  WBC 7.5 7.6 8.5  NEUTROABS 5.6  --   --   HGB 13.0 13.2 12.5  HCT 37.5 38.4 35.7*  MCV 88.2 88.5 88.6  PLT 205 186 [id number]   Basic Metabolic Panel  Recent Labs  07/21/16 0420 07/22/16 0526  NA 139 134*  K 3.8 4.0  CL 103 98*  CO2 26 27  GLUCOSE 125* 175*  BUN 14 14  CREATININE 0.92 0.96  CALCIUM 9.7  9.6 9.3   Liver Function  Tests  Recent Labs  07/20/16 0956 07/21/16 0420  AST 23 68*  ALT 20 28  ALKPHOS 68 55  BILITOT 0.5 1.0  PROT 7.3 7.0  ALBUMIN 4.6 3.9   No results for input(s): LIPASE, AMYLASE in the last 72 hours. Cardiac Enzymes  Recent Labs  07/21/16 1649 07/21/16 2336 07/22/16 0526  TROPONINI 6.41* 7.19* 4.13*   BNP Invalid input(s): POCBNP D-Dimer No results for input(s): DDIMER in the last 72 hours. Hemoglobin A1C  Recent Labs  07/20/16 1740  HGBA1C 7.1*   Fasting Lipid Panel  Recent Labs  07/21/16 0537  CHOL 254*  HDL 39*  LDLCALC 164*  TRIG 255*  CHOLHDL 6.5   Thyroid Function Tests No results for input(s): TSH, T4TOTAL, T3FREE, THYROIDAB in the last 72 hours.  Invalid input(s): FREET3  Telemetry    Sinus rhythm in the 70s  ECG    Normal sinus rhythm 67 BPM. Nonspecific ST and T-wave changes.   Cardiology   CARDIAC CATH 07/20/2016 FINDINGS  Ost 1st Sept to 1st Sept lesion, 90 %stenosed.    Prox LAD lesion, 90 %stenosed.  Mid LAD lesion, 80 %stenosed.  Dist LAD lesion, 70 %stenosed.  Ost Cx to Prox Cx lesion, 100 %stenosed. - Fills via right to left collaterals  2nd RPLB lesion, 100 %stenosed.  The left ventricular systolic function is normal.  The left ventricular ejection fraction is 50-55% by visual estimate.  LV end diastolic pressure is moderately elevated.   CONCLUSION 1. Severe 3 vessel obstructive CAD.    - there is complex proximal to mid LAD disease at the takeoff of a large septal perforator and 2 large diagonal branches.    - 100% occlusion of the proximal LCx- culprit vessel. This fills by right to left collaterals.    -100% occlusion of a small PL branch of the RCA that fills by left to right collaterals. 2. Overall good LV function 3. Moderately elevated LVEDP  PLAN: patient is currently pain free. Will transfer to ICU and continue IV Ntg and heparin. The complex LAD disease make it unfavorable to treat her with PCI. Would  recommend CT surgery consult for CABG.    Radiology    Dg Chest 2 View  Result Date: 07/20/2016 CLINICAL DATA:  SOB x 1 week EXAM: CHEST - 2 VIEW COMPARISON:  06/27/2014 FINDINGS: Stable left subclavian pacemaker.  No  pneumothorax.  Lungs clear. Heart size and mediastinal contours are within normal limits. No effusion. Visualized bones unremarkable. IMPRESSION: No acute cardiopulmonary disease. Electronically Signed   By: Lucrezia Europe M.D.   On: 07/20/2016 10:09    Patient Profile     [name] [name] is a 60 y.o. female with past medical history of sinus node dysfunction (s/p PPM placement), Type 2 DM, HTN, and HLD who presents to [facility] ED on 07/20/2016 for evaluation of chest pain and dyspnea.  She was noted to have STE in V2 & V3 with initial Troponin of 0.15.  With ST elevation on EKG and continued chest discomfort, she was brought to the cardiac catheterization lab for an emergent Cardiac catheterization which revealed multivessel CAD.   Assessment & Plan

## 2016-07-22 NOTE — Anesthesia Postprocedure Evaluation (Signed)
Anesthesia Post Note  Patient: Gordana Moment  Procedure(s) Performed: Procedure(s) (LRB): CORONARY ARTERY BYPASS GRAFTING (CABG) x three , using left internal mammary artery and right leg greater saphenous vein harvested endoscopically (N/A) TRANSESOPHAGEAL ECHOCARDIOGRAM (TEE) (N/A)  Patient location during evaluation: SICU Anesthesia Type: General Level of consciousness: patient remains intubated per anesthesia plan Vital Signs Assessment: post-procedure vital signs reviewed and stable Respiratory status: patient remains intubated per anesthesia plan and patient on ventilator - see flowsheet for VS Cardiovascular status: blood pressure returned to baseline Anesthetic complications: no    Last Vitals:  Vitals:   07/22/16 2045 07/22/16 2100  BP:    Pulse: 88 88  Resp: 17 19  Temp: 36.1 C 36.1 C    Last Pain:  Vitals:   07/22/16 0800  TempSrc: Oral  PainSc: 0-No pain                 Asaad Gulley COKER

## 2016-07-22 NOTE — Transfer of Care (Signed)
Immediate Anesthesia Transfer of Care Note  Patient: [name] [name]  Procedure(s) Performed: Procedure(s): CORONARY ARTERY BYPASS GRAFTING (CABG) x three , using left internal mammary artery and right leg greater saphenous vein harvested endoscopically (N/A) TRANSESOPHAGEAL ECHOCARDIOGRAM (TEE) (N/A)  Patient Location: SICU  Anesthesia Type:General  Level of Consciousness: Patient remains intubated per anesthesia plan  Airway & Oxygen Therapy: Patient remains intubated per anesthesia plan and Patient placed on Ventilator (see vital sign flow sheet for setting)  Post-op Assessment: Report given to RN and Post -op Vital signs reviewed and stable  Post vital signs: Reviewed and stable  Last Vitals:  Vitals:   07/22/16 1000 07/22/16 2010  BP: 101/69   Pulse: 72 73  Resp: (!) 22 12  Temp:      Last Pain:  Vitals:   07/22/16 0800  TempSrc: Oral  PainSc: 0-No pain      Patients Stated Pain Goal: 0 ([id number])  Complications: No apparent anesthesia complications

## 2016-07-22 NOTE — Anesthesia Procedure Notes (Signed)
Central Venous Catheter Insertion Performed by: anesthesiologist Patient location: Pre-op. Preanesthetic checklist: patient identified, IV checked, site marked, risks and benefits discussed, surgical consent, monitors and equipment checked, pre-op evaluation, timeout performed and anesthesia consent Position: Trendelenburg Lidocaine 1% used for infiltration Landmarks identified Catheter size: 9 Fr MAC introducer Procedure performed using ultrasound guided technique. Attempts: 1 Following insertion, line sutured and dressing applied. Post procedure assessment: blood return through all ports, free fluid flow and no air. Patient tolerated the procedure well with no immediate complications.

## 2016-07-23 ENCOUNTER — Inpatient Hospital Stay (HOSPITAL_COMMUNITY): Payer: BLUE CROSS/BLUE SHIELD

## 2016-07-23 ENCOUNTER — Encounter (HOSPITAL_COMMUNITY): Payer: Self-pay | Admitting: Cardiothoracic Surgery

## 2016-07-23 LAB — MAGNESIUM
MAGNESIUM: 2 mg/dL (ref 1.7–2.4)
Magnesium: 2.7 mg/dL — ABNORMAL HIGH (ref 1.7–2.4)

## 2016-07-23 LAB — GLUCOSE, CAPILLARY
GLUCOSE-CAPILLARY: 143 mg/dL — AB (ref 65–99)
GLUCOSE-CAPILLARY: 135 mg/dL — AB (ref 65–99)
GLUCOSE-CAPILLARY: 122 mg/dL — AB (ref 65–99)
GLUCOSE-CAPILLARY: 112 mg/dL — AB (ref 65–99)
GLUCOSE-CAPILLARY: 143 mg/dL — AB (ref 65–99)
GLUCOSE-CAPILLARY: 250 mg/dL — AB (ref 65–99)
Glucose-Capillary: 151 mg/dL — ABNORMAL HIGH (ref 65–99)
Glucose-Capillary: 177 mg/dL — ABNORMAL HIGH (ref 65–99)
Glucose-Capillary: 151 mg/dL — ABNORMAL HIGH (ref 65–99)
Glucose-Capillary: 122 mg/dL — ABNORMAL HIGH (ref 65–99)
Glucose-Capillary: 110 mg/dL — ABNORMAL HIGH (ref 65–99)
Glucose-Capillary: 119 mg/dL — ABNORMAL HIGH (ref 65–99)
Glucose-Capillary: 155 mg/dL — ABNORMAL HIGH (ref 65–99)
Glucose-Capillary: 198 mg/dL — ABNORMAL HIGH (ref 65–99)

## 2016-07-23 LAB — POCT I-STAT 3, ART BLOOD GAS (G3+)
Acid-base deficit: 3 mmol/L — ABNORMAL HIGH (ref 0.0–2.0)
Acid-base deficit: 2 mmol/L (ref 0.0–2.0)
Acid-base deficit: 2 mmol/L (ref 0.0–2.0)
BICARBONATE: 23.7 mmol/L (ref 20.0–28.0)
BICARBONATE: 24.2 mmol/L (ref 20.0–28.0)
Bicarbonate: 23.7 mmol/L (ref 20.0–28.0)
O2 Saturation: 88 %
O2 Saturation: 88 %
O2 Saturation: 87 %
PCO2 ART: 45.9 mmHg (ref 32.0–48.0)
PH ART: 7.319 — AB (ref 7.350–7.450)
PH ART: 7.331 — AB (ref 7.350–7.450)
PO2 ART: 61 mmHg — AB (ref 83.0–108.0)
PO2 ART: 58 mmHg — AB (ref 83.0–108.0)
Patient temperature: 37.3
Patient temperature: 37.1
TCO2: 25 mmol/L (ref 0–100)
TCO2: 25 mmol/L (ref 0–100)
TCO2: 26 mmol/L (ref 0–100)
pCO2 arterial: 48.6 mmHg — ABNORMAL HIGH (ref 32.0–48.0)
pCO2 arterial: 46.1 mmHg (ref 32.0–48.0)
pH, Arterial: 7.297 — ABNORMAL LOW (ref 7.350–7.450)
pO2, Arterial: 60 mmHg — ABNORMAL LOW (ref 83.0–108.0)

## 2016-07-23 LAB — POCT I-STAT, CHEM 8
BUN: 13 mg/dL (ref 6–20)
BUN: 15 mg/dL (ref 6–20)
CHLORIDE: 100 mmol/L — AB (ref 101–111)
CREATININE: 0.7 mg/dL (ref 0.44–1.00)
CREATININE: 0.8 mg/dL (ref 0.44–1.00)
Calcium, Ion: 1.11 mmol/L — ABNORMAL LOW (ref 1.15–1.40)
Calcium, Ion: 1.1 mmol/L — ABNORMAL LOW (ref 1.15–1.40)
Chloride: 95 mmol/L — ABNORMAL LOW (ref 101–111)
Glucose, Bld: 174 mg/dL — ABNORMAL HIGH (ref 65–99)
Glucose, Bld: 271 mg/dL — ABNORMAL HIGH (ref 65–99)
HEMATOCRIT: 34 % — AB (ref 36.0–46.0)
HEMATOCRIT: 29 % — AB (ref 36.0–46.0)
HEMOGLOBIN: 9.9 g/dL — AB (ref 12.0–15.0)
Hemoglobin: 11.6 g/dL — ABNORMAL LOW (ref 12.0–15.0)
POTASSIUM: 4.2 mmol/L (ref 3.5–5.1)
POTASSIUM: 4.2 mmol/L (ref 3.5–5.1)
Sodium: 137 mmol/L (ref 135–145)
Sodium: 133 mmol/L — ABNORMAL LOW (ref 135–145)
TCO2: 23 mmol/L (ref 0–100)
TCO2: 26 mmol/L (ref 0–100)

## 2016-07-23 LAB — CBC
HCT: 30.9 % — ABNORMAL LOW (ref 36.0–46.0)
HCT: 30.4 % — ABNORMAL LOW (ref 36.0–46.0)
HEMOGLOBIN: 10.1 g/dL — AB (ref 12.0–15.0)
Hemoglobin: 10.6 g/dL — ABNORMAL LOW (ref 12.0–15.0)
MCH: 30.4 pg (ref 26.0–34.0)
MCH: 30 pg (ref 26.0–34.0)
MCHC: 34.3 g/dL (ref 30.0–36.0)
MCHC: 33.2 g/dL (ref 30.0–36.0)
MCV: 88.5 fL (ref 78.0–100.0)
MCV: 90.2 fL (ref 78.0–100.0)
Platelets: 172 K/uL (ref 150–400)
Platelets: 165 10*3/uL (ref 150–400)
RBC: 3.49 MIL/uL — ABNORMAL LOW (ref 3.87–5.11)
RBC: 3.37 MIL/uL — ABNORMAL LOW (ref 3.87–5.11)
RDW: 13.1 % (ref 11.5–15.5)
RDW: 13.6 % (ref 11.5–15.5)
WBC: 14.5 K/uL — ABNORMAL HIGH (ref 4.0–10.5)
WBC: 14.2 10*3/uL — AB (ref 4.0–10.5)

## 2016-07-23 LAB — CREATININE, SERUM: CREATININE: 0.96 mg/dL (ref 0.44–1.00)

## 2016-07-23 LAB — BASIC METABOLIC PANEL WITH GFR
Anion gap: 8 (ref 5–15)
BUN: 12 mg/dL (ref 6–20)
CO2: 24 mmol/L (ref 22–32)
Calcium: 7.6 mg/dL — ABNORMAL LOW (ref 8.9–10.3)
Chloride: 103 mmol/L (ref 101–111)
Creatinine, Ser: 0.84 mg/dL (ref 0.44–1.00)
GFR calc Af Amer: >60 mL/min
GFR calc non Af Amer: >60 mL/min
Glucose, Bld: 176 mg/dL — ABNORMAL HIGH (ref 65–99)
Potassium: 4.1 mmol/L (ref 3.5–5.1)
Sodium: 135 mmol/L (ref 135–145)

## 2016-07-23 LAB — POCT I-STAT 4, (NA,K, GLUC, HGB,HCT)
GLUCOSE: 178 mg/dL — AB (ref 65–99)
HEMATOCRIT: 32 % — AB (ref 36.0–46.0)
Hemoglobin: 10.9 g/dL — ABNORMAL LOW (ref 12.0–15.0)
Potassium: 4 mmol/L (ref 3.5–5.1)
SODIUM: 137 mmol/L (ref 135–145)

## 2016-07-23 MED ORDER — FUROSEMIDE 10 MG/ML IJ SOLN
40.0000 mg | Freq: Two times a day (BID) | INTRAMUSCULAR | Status: DC
  Administered 2016-07-23 – 2016-07-24 (×3): 40 mg via INTRAVENOUS
  Filled 2016-07-23 (×3): qty 4

## 2016-07-23 MED ORDER — BUDESONIDE 0.5 MG/2ML IN SUSP
0.5000 mg | Freq: Two times a day (BID) | RESPIRATORY_TRACT | Status: DC
  Administered 2016-07-23 – 2016-07-27 (×9): 0.5 mg via RESPIRATORY_TRACT
  Filled 2016-07-23 (×9): qty 2

## 2016-07-23 MED ORDER — VANCOMYCIN HCL IN DEXTROSE 1-5 GM/200ML-% IV SOLN
1000.0000 mg | Freq: Once | INTRAVENOUS | Status: AC
  Administered 2016-07-23: 1000 mg via INTRAVENOUS
  Filled 2016-07-23: qty 200

## 2016-07-23 MED ORDER — INSULIN DETEMIR 100 UNIT/ML ~~LOC~~ SOLN
14.0000 [IU] | Freq: Two times a day (BID) | SUBCUTANEOUS | Status: DC
  Administered 2016-07-23 – 2016-07-24 (×3): 14 [IU] via SUBCUTANEOUS
  Filled 2016-07-23 (×4): qty 0.14

## 2016-07-23 MED ORDER — INSULIN ASPART 100 UNIT/ML ~~LOC~~ SOLN
0.0000 [IU] | SUBCUTANEOUS | Status: DC
  Administered 2016-07-23: 8 [IU] via SUBCUTANEOUS
  Administered 2016-07-23: 4 [IU] via SUBCUTANEOUS
  Administered 2016-07-23: 2 [IU] via SUBCUTANEOUS
  Administered 2016-07-24: 8 [IU] via SUBCUTANEOUS
  Administered 2016-07-24 (×2): 4 [IU] via SUBCUTANEOUS
  Administered 2016-07-24: 8 [IU] via SUBCUTANEOUS

## 2016-07-23 MED ORDER — ORAL CARE MOUTH RINSE
15.0000 mL | Freq: Two times a day (BID) | OROMUCOSAL | Status: DC
  Administered 2016-07-23 – 2016-07-25 (×4): 15 mL via OROMUCOSAL

## 2016-07-23 MED FILL — Dexmedetomidine HCl in NaCl 0.9% IV Soln 400 MCG/100ML: INTRAVENOUS | Qty: 100 | Status: AC

## 2016-07-23 NOTE — Procedures (Signed)
Extubation Procedure Note  Patient Details:   Name: [name] [name] DOB: [birth year] MRN: [id number]   Airway Documentation:     Evaluation  O2 sats: stable throughout Complications: No apparent complications Patient did tolerate procedure well. Bilateral Breath Sounds: Clear   Yes   RT extubated pt. Per rapid wean protocol. Pt. Performed .7L on the vital capacity, -25 on the NIF and 750x5 on the incentive spirometer. Pt,. Had a positive cuff leak and was able to state name after extubation. Pt. Placed on 4L nasal cannula initially after extubation but then placed on cpap at 10cmh20 with 6L oxygen titrated into the cpap pt. Tolerating well at this time.  Marlowe Aschoff 07/23/2016, 4:57 AM

## 2016-07-23 NOTE — Progress Notes (Signed)
Wean protocol initiated. 

## 2016-07-23 NOTE — Addendum Note (Signed)
Addendum  created 07/23/16 0912 by Roberts Gaudy, MD   Sign clinical note

## 2016-07-23 NOTE — Progress Notes (Signed)
1 Day Post-Op Procedure(s) (LRB): CORONARY ARTERY BYPASS GRAFTING (CABG) x three , using left internal mammary artery and right leg greater saphenous vein harvested endoscopically (N/A) TRANSESOPHAGEAL ECHOCARDIOGRAM (TEE) (N/A) Subjective: Air leak from L PT Awake on CPAP Wt up 12 lbs- iv lasix ordered  Objective: Vital signs in last 24 hours: Temp:  [97 F (36.1 C)-100.8 F (38.2 C)] 99 F (37.2 C) (10/13 0730) Pulse Rate:  [71-101] 98 (10/13 0730) Cardiac Rhythm: Atrial paced (10/13 0400) Resp:  [12-25] 19 (10/13 0730) BP: (86-132)/(43-69) 109/69 (10/13 0700) SpO2:  [89 %-98 %] 92 % (10/13 0730) Arterial Line BP: (108-170)/(55-71) 156/60 (10/13 0730) FiO2 (%):  [40 %-70 %] 40 % (10/13 0400) Weight:  [253 lb 12 oz (115.1 kg)] 253 lb 12 oz (115.1 kg) (10/13 0500)  Hemodynamic parameters for last 24 hours: PAP: (25-39)/(14-30) 29/20 CO:  [6.4 L/min-7.8 L/min] 6.4 L/min CI:  [3.1 L/min/m2-3.8 L/min/m2] 3.1 L/min/m2  Intake/Output from previous day: 10/12 0701 - 10/13 0700 In: 4720.7 [I.V.:3015.7; Blood:795; NG/GT:60; IV [id number] Out: [id number] [Urine:3310; Emesis/NG output:200; Blood:1625; Chest Tube:190] Intake/Output this shift: No intake/output data recorded.       Exam    General- alert and comfortable   Lungs- clear without rales, wheezes   Cor- regular rate and rhythm, no murmur , gallop   Abdomen- soft, non-tender   Extremities - warm, non-tender, minimal edema   Neuro- oriented, appropriate, no focal weakness   Lab Results:  Recent Labs  07/22/16 2009  07/23/16 0159 07/23/16 0200  WBC 16.8*  --   --  14.5*  HGB 10.1*  < > 11.6* 10.6*  HCT 29.4*  < > 34.0* 30.9*  PLT 152  --   --  172  < > = values in this interval not displayed. BMET:  Recent Labs  07/22/16 0526  07/23/16 0159 07/23/16 0200  NA 134*  < > 137 135  K 4.0  < > 4.2 4.1  CL 98*  < > 100* 103  CO2 27  --   --  24  GLUCOSE 175*  < > 174* 176*  BUN 14  < > 13 12  CREATININE 0.96   < > 0.70 0.84  CALCIUM 9.3  --   --  7.6*  < > = values in this interval not displayed.  PT/INR:  Recent Labs  07/22/16 2009  LABPROT 16.5*  INR 1.33   ABG    Component Value Date/Time   PHART 7.319 (L) 07/23/2016 0418   HCO3 23.7 07/23/2016 0418   TCO2 25 07/23/2016 0418   ACIDBASEDEF 2.0 07/23/2016 0418   O2SAT 88.0 07/23/2016 0418   CBG (last 3)   Recent Labs  07/23/16 0458 07/23/16 0604 07/23/16 0656  GLUCAP 135* 122* 122*    Assessment/Plan: S/P Procedure(s) (LRB): CORONARY ARTERY BYPASS GRAFTING (CABG) x three , using left internal mammary artery and right leg greater saphenous vein harvested endoscopically (N/A) TRANSESOPHAGEAL ECHOCARDIOGRAM (TEE) (N/A) Mobilize Diuresis Diabetes control Continue foley due to diuresing patient See progression orders cont low dose milrinone   LOS: 3 days    Tharon Aquas Trigt III 07/23/2016

## 2016-07-23 NOTE — Progress Notes (Signed)
Pt taken off CPAP and placed on a humidified mask at 50% per MD request. RN aware.

## 2016-07-23 NOTE — Progress Notes (Signed)
RRT flipped pt to CPAP/PS per rapid wean protocol.

## 2016-07-23 NOTE — Progress Notes (Signed)
Flipped to 40/4 by RRT.

## 2016-07-23 NOTE — Progress Notes (Signed)
Anesthesiology Follow-up:  Awake and alert, hemodynamically stable on milrinone 0.2 mcg/kg/min. Neosynephrine weaned off. Extubated at 05:00 today. Now on 50% facemask O2 started for Sats 90-92% on 8 lpm CPAP.   VS: T- 37.2 BP- 143/61 HR- 100 (SR) RR 10 O2 Sat 92% PA- 30/24 CO/CI- 6.4/3.1  No apparent complications.

## 2016-07-23 NOTE — Progress Notes (Signed)
Notified RRT ready to start weaning.

## 2016-07-23 NOTE — Progress Notes (Signed)
ABG outside parameters. Will have RRT do recruitment maneuver and then try again in an hour.

## 2016-07-24 ENCOUNTER — Inpatient Hospital Stay (HOSPITAL_COMMUNITY): Payer: BLUE CROSS/BLUE SHIELD

## 2016-07-24 LAB — GLUCOSE, CAPILLARY
GLUCOSE-CAPILLARY: 217 mg/dL — AB (ref 65–99)
GLUCOSE-CAPILLARY: 195 mg/dL — AB (ref 65–99)
GLUCOSE-CAPILLARY: 170 mg/dL — AB (ref 65–99)
Glucose-Capillary: 166 mg/dL — ABNORMAL HIGH (ref 65–99)
Glucose-Capillary: 167 mg/dL — ABNORMAL HIGH (ref 65–99)
Glucose-Capillary: 198 mg/dL — ABNORMAL HIGH (ref 65–99)
Glucose-Capillary: 205 mg/dL — ABNORMAL HIGH (ref 65–99)
Glucose-Capillary: 227 mg/dL — ABNORMAL HIGH (ref 65–99)

## 2016-07-24 LAB — BASIC METABOLIC PANEL
Anion gap: 8 (ref 5–15)
BUN: 16 mg/dL (ref 6–20)
CO2: 27 mmol/L (ref 22–32)
Calcium: 8.2 mg/dL — ABNORMAL LOW (ref 8.9–10.3)
Chloride: 99 mmol/L — ABNORMAL LOW (ref 101–111)
Creatinine, Ser: 0.89 mg/dL (ref 0.44–1.00)
GFR calc Af Amer: >60 mL/min (ref >60)
GFR calc non Af Amer: >60 mL/min (ref >60)
Glucose, Bld: 219 mg/dL — ABNORMAL HIGH (ref 65–99)
Potassium: 4.1 mmol/L (ref 3.5–5.1)
Sodium: 134 mmol/L — ABNORMAL LOW (ref 135–145)

## 2016-07-24 LAB — CBC
HCT: 28.2 % — ABNORMAL LOW (ref 36.0–46.0)
Hemoglobin: 9.3 g/dL — ABNORMAL LOW (ref 12.0–15.0)
MCH: 29.8 pg (ref 26.0–34.0)
MCHC: 33 g/dL (ref 30.0–36.0)
MCV: 90.4 fL (ref 78.0–100.0)
Platelets: 163 10*3/uL (ref 150–400)
RBC: 3.12 MIL/uL — ABNORMAL LOW (ref 3.87–5.11)
RDW: 13.7 % (ref 11.5–15.5)
WBC: 13.2 10*3/uL — ABNORMAL HIGH (ref 4.0–10.5)

## 2016-07-24 MED ORDER — SODIUM CHLORIDE 0.9 % IV SOLN: 250.0000 mL | INTRAVENOUS | Status: DC | PRN

## 2016-07-24 MED ORDER — MAGNESIUM HYDROXIDE 400 MG/5ML PO SUSP: 30.0000 mL | Freq: Every day | ORAL | Status: DC | PRN

## 2016-07-24 MED ORDER — INSULIN ASPART 100 UNIT/ML ~~LOC~~ SOLN
0.0000 [IU] | Freq: Three times a day (TID) | SUBCUTANEOUS | Status: DC
  Administered 2016-07-24 – 2016-07-25 (×3): 4 [IU] via SUBCUTANEOUS
  Administered 2016-07-25 (×2): 2 [IU] via SUBCUTANEOUS
  Administered 2016-07-26: 4 [IU] via SUBCUTANEOUS
  Administered 2016-07-26 – 2016-07-27 (×2): 2 [IU] via SUBCUTANEOUS

## 2016-07-24 MED ORDER — SODIUM CHLORIDE 0.9% FLUSH
3.0000 mL | Freq: Two times a day (BID) | INTRAVENOUS | Status: DC
  Administered 2016-07-24 – 2016-07-26 (×5): 3 mL via INTRAVENOUS

## 2016-07-24 MED ORDER — SODIUM CHLORIDE 0.9% FLUSH: 3.0000 mL | INTRAVENOUS | Status: DC | PRN

## 2016-07-24 MED ORDER — MOVING RIGHT ALONG BOOK
Freq: Once | Status: AC
  Administered 2016-07-24: 1
  Filled 2016-07-24: qty 1

## 2016-07-24 MED ORDER — POTASSIUM CHLORIDE CRYS ER 20 MEQ PO TBCR
20.0000 meq | EXTENDED_RELEASE_TABLET | Freq: Every day | ORAL | Status: DC
  Administered 2016-07-25 – 2016-07-27 (×3): 20 meq via ORAL
  Filled 2016-07-24 (×3): qty 1

## 2016-07-24 MED ORDER — AMIODARONE HCL 200 MG PO TABS
400.0000 mg | ORAL_TABLET | Freq: Two times a day (BID) | ORAL | Status: DC
  Administered 2016-07-24 – 2016-07-27 (×7): 400 mg via ORAL
  Filled 2016-07-24 (×7): qty 2

## 2016-07-24 MED ORDER — FUROSEMIDE 10 MG/ML IJ SOLN
40.0000 mg | Freq: Every day | INTRAMUSCULAR | Status: DC
  Administered 2016-07-25 – 2016-07-26 (×2): 40 mg via INTRAVENOUS
  Filled 2016-07-24 (×2): qty 4

## 2016-07-24 MED ORDER — INSULIN DETEMIR 100 UNIT/ML ~~LOC~~ SOLN
20.0000 [IU] | Freq: Two times a day (BID) | SUBCUTANEOUS | Status: DC
  Administered 2016-07-24 – 2016-07-25 (×3): 20 [IU] via SUBCUTANEOUS
  Filled 2016-07-24 (×5): qty 0.2

## 2016-07-24 NOTE — Progress Notes (Signed)
2 Days Post-Op Procedure(s) (LRB): CORONARY ARTERY BYPASS GRAFTING (CABG) x three , using left internal mammary artery and right leg greater saphenous vein harvested endoscopically (N/A) TRANSESOPHAGEAL ECHOCARDIOGRAM (TEE) (N/A) Subjective: Patient continues to have significant oxygen demands-70% facemask Hemodynamic stable-wean off milrinone and start oral amiodarone for intermittent atrial fibrillation Diabetes control with Lantus plus sliding scale  Objective: Vital signs in last 24 hours: Temp:  [98.2 F (36.8 C)-100.9 F (38.3 C)] 98.4 F (36.9 C) (10/14 1100) Pulse Rate:  [55-109] 78 (10/14 1216) Cardiac Rhythm: Normal sinus rhythm (10/14 0800) Resp:  [13-31] 15 (10/14 1000) BP: (90-131)/(46-85) 102/57 (10/14 1216) SpO2:  [89 %-95 %] 95 % (10/14 1000) FiO2 (%):  [50 %] 50 % (10/14 0801) Weight:  [248 lb 7.3 oz (112.7 kg)] 248 lb 7.3 oz (112.7 kg) (10/14 0600)  Hemodynamic parameters for last 24 hours:  stable Intake/Output from previous day: 10/13 0701 - 10/14 0700 In: 1588.5 [P.O.:120; I.V.:1468.5] Out: 1745 [Urine:1665; Chest Tube:80] Intake/Output this shift: Total I/O In: 489.5 [P.O.:360; I.V.:129.5] Out: 365 [Urine:335; Chest Tube:30]  Lungs clear Extremities warm Abdomen soft  Lab Results:  Recent Labs  07/23/16 1735 07/24/16 0420  WBC 14.2* 13.2*  HGB 10.1* 9.3*  HCT 30.4* 28.2*  PLT 165 163   BMET:  Recent Labs  07/23/16 0200 07/23/16 1707 07/23/16 1735 07/24/16 0420  NA 135 133*  --  134*  K 4.1 4.2  --  4.1  CL 103 95*  --  99*  CO2 24  --   --  27  GLUCOSE 176* 271*  --  219*  BUN 12 15  --  16  CREATININE 0.84 0.80 0.96 0.89  CALCIUM 7.6*  --   --  8.2*    PT/INR:  Recent Labs  07/22/16 2009  LABPROT 16.5*  INR 1.33   ABG    Component Value Date/Time   PHART 7.331 (L) 07/23/2016 0606   HCO3 24.2 07/23/2016 0606   TCO2 26 07/23/2016 1707   ACIDBASEDEF 2.0 07/23/2016 0606   O2SAT 87.0 07/23/2016 0606   CBG (last 3)    Recent Labs  07/24/16 0423 07/24/16 0753 07/24/16 1152  GLUCAP 217* 195* 227*    Assessment/Plan: S/P Procedure(s) (LRB): CORONARY ARTERY BYPASS GRAFTING (CABG) x three , using left internal mammary artery and right leg greater saphenous vein harvested endoscopically (N/A) TRANSESOPHAGEAL ECHOCARDIOGRAM (TEE) (N/A) Mobilize Diuresis Diabetes control Transfer to stepdown if patient can be supported with nasal cannula   LOS: 4 days    [name] [name] 07/24/2016

## 2016-07-25 ENCOUNTER — Inpatient Hospital Stay (HOSPITAL_COMMUNITY): Payer: BLUE CROSS/BLUE SHIELD

## 2016-07-25 LAB — TYPE AND SCREEN
ABO/RH(D): O POS
Antibody Screen: NEGATIVE
Unit division: 0
Unit division: 0

## 2016-07-25 LAB — GLUCOSE, CAPILLARY
GLUCOSE-CAPILLARY: 130 mg/dL — AB (ref 65–99)
GLUCOSE-CAPILLARY: 166 mg/dL — AB (ref 65–99)
GLUCOSE-CAPILLARY: 132 mg/dL — AB (ref 65–99)
Glucose-Capillary: 130 mg/dL — ABNORMAL HIGH (ref 65–99)

## 2016-07-25 LAB — BASIC METABOLIC PANEL
Anion gap: 9 (ref 5–15)
BUN: 30 mg/dL — ABNORMAL HIGH (ref 6–20)
CO2: 28 mmol/L (ref 22–32)
Calcium: 8.8 mg/dL — ABNORMAL LOW (ref 8.9–10.3)
Chloride: 96 mmol/L — ABNORMAL LOW (ref 101–111)
Creatinine, Ser: 0.98 mg/dL (ref 0.44–1.00)
GFR calc Af Amer: >60 mL/min (ref >60)
GFR calc non Af Amer: >60 mL/min (ref >60)
Glucose, Bld: 138 mg/dL — ABNORMAL HIGH (ref 65–99)
Potassium: 4.2 mmol/L (ref 3.5–5.1)
Sodium: 133 mmol/L — ABNORMAL LOW (ref 135–145)

## 2016-07-25 LAB — CBC
HCT: 27.9 % — ABNORMAL LOW (ref 36.0–46.0)
Hemoglobin: 9.3 g/dL — ABNORMAL LOW (ref 12.0–15.0)
MCH: 30.5 pg (ref 26.0–34.0)
MCHC: 33.3 g/dL (ref 30.0–36.0)
MCV: 91.5 fL (ref 78.0–100.0)
Platelets: 213 10*3/uL (ref 150–400)
RBC: 3.05 MIL/uL — ABNORMAL LOW (ref 3.87–5.11)
RDW: 14.2 % (ref 11.5–15.5)
WBC: 15.8 10*3/uL — ABNORMAL HIGH (ref 4.0–10.5)

## 2016-07-25 NOTE — Progress Notes (Signed)
Patient sitting up in bed, pain meds given. No other needs at this time, call light within reach

## 2016-07-25 NOTE — Progress Notes (Signed)
3 Days Post-Op Procedure(s) (LRB): CORONARY ARTERY BYPASS GRAFTING (CABG) x three , using left internal mammary artery and right leg greater saphenous vein harvested endoscopically (N/A) TRANSESOPHAGEAL ECHOCARDIOGRAM (TEE) (N/A) Subjective: Oxygenation has improved Chest x-ray with improved lung volumes Maintaining sinus rhythm Able ambulate in hallway Transfer to stepdown  Objective: Vital signs in last 24 hours: Temp:  [97 F (36.1 C)-98.8 F (37.1 C)] 97.5 F (36.4 C) (10/15 0700) Pulse Rate:  [70-100] 81 (10/15 0700) Cardiac Rhythm: Normal sinus rhythm (10/15 0700) Resp:  [12-22] 18 (10/15 0700) BP: (96-126)/(38-71) 125/70 (10/15 0700) SpO2:  [86 %-98 %] 97 % (10/15 0744) FiO2 (%):  [40 %] 40 % (10/14 1600) Weight:  [248 lb 14.4 oz (112.9 kg)] 248 lb 14.4 oz (112.9 kg) (10/15 0335)  Hemodynamic parameters for last 24 hours:    Intake/Output from previous day: 10/14 0701 - 10/15 0700 In: 832.5 [P.O.:660; I.V.:172.5] Out: 1565 [Urine:1535; Chest Tube:30] Intake/Output this shift: No intake/output data recorded.      Physical Exam       Exam    General- alert and comfortable   Lungs- clear without rales, wheezes   Cor- regular rate and rhythm, no murmur , gallop   Abdomen- soft, non-tender   Extremities - warm, non-tender, minimal edema   Neuro- oriented, appropriate, no focal weakness     Lab Results:  Recent Labs  07/24/16 0420 07/25/16 0405  WBC 13.2* 15.8*  HGB 9.3* 9.3*  HCT 28.2* 27.9*  PLT 163 213   BMET:  Recent Labs  07/24/16 0420 07/25/16 0405  NA 134* 133*  K 4.1 4.2  CL 99* 96*  CO2 27 28  GLUCOSE 219* 138*  BUN 16 30*  CREATININE 0.89 0.98  CALCIUM 8.2* 8.8*    PT/INR:  Recent Labs  07/22/16 2009  LABPROT 16.5*  INR 1.33   ABG    Component Value Date/Time   PHART 7.331 (L) 07/23/2016 0606   HCO3 24.2 07/23/2016 0606   TCO2 26 07/23/2016 1707   ACIDBASEDEF 2.0 07/23/2016 0606   O2SAT 87.0 07/23/2016 0606   CBG  (last 3)   Recent Labs  07/24/16 2319 07/25/16 0315 07/25/16 0732  GLUCAP 166* 130* 130*    Assessment/Plan: S/P Procedure(s) (LRB): CORONARY ARTERY BYPASS GRAFTING (CABG) x three , using left internal mammary artery and right leg greater saphenous vein harvested endoscopically (N/A) TRANSESOPHAGEAL ECHOCARDIOGRAM (TEE) (N/A) Mobilize Diuresis Diabetes control Plan for transfer to step-down: see transfer orders   LOS: 5 days    [name] [name] 07/25/2016

## 2016-07-26 ENCOUNTER — Inpatient Hospital Stay (HOSPITAL_COMMUNITY): Payer: BLUE CROSS/BLUE SHIELD

## 2016-07-26 LAB — GLUCOSE, CAPILLARY
GLUCOSE-CAPILLARY: 105 mg/dL — AB (ref 65–99)
GLUCOSE-CAPILLARY: 175 mg/dL — AB (ref 65–99)
GLUCOSE-CAPILLARY: 79 mg/dL (ref 65–99)
GLUCOSE-CAPILLARY: 99 mg/dL (ref 65–99)
Glucose-Capillary: 108 mg/dL — ABNORMAL HIGH (ref 65–99)
Glucose-Capillary: 122 mg/dL — ABNORMAL HIGH (ref 65–99)

## 2016-07-26 LAB — BASIC METABOLIC PANEL
Anion gap: 7 (ref 5–15)
BUN: 28 mg/dL — ABNORMAL HIGH (ref 6–20)
CO2: 31 mmol/L (ref 22–32)
Calcium: 9 mg/dL (ref 8.9–10.3)
Chloride: 97 mmol/L — ABNORMAL LOW (ref 101–111)
Creatinine, Ser: 0.87 mg/dL (ref 0.44–1.00)
GFR calc Af Amer: >60 mL/min (ref >60)
GFR calc non Af Amer: >60 mL/min (ref >60)
Glucose, Bld: 114 mg/dL — ABNORMAL HIGH (ref 65–99)
Potassium: 4.4 mmol/L (ref 3.5–5.1)
Sodium: 135 mmol/L (ref 135–145)

## 2016-07-26 LAB — CBC
HCT: 27.7 % — ABNORMAL LOW (ref 36.0–46.0)
Hemoglobin: 9.1 g/dL — ABNORMAL LOW (ref 12.0–15.0)
MCH: 30.2 pg (ref 26.0–34.0)
MCHC: 32.9 g/dL (ref 30.0–36.0)
MCV: 92 fL (ref 78.0–100.0)
Platelets: 229 10*3/uL (ref 150–400)
RBC: 3.01 MIL/uL — ABNORMAL LOW (ref 3.87–5.11)
RDW: 14 % (ref 11.5–15.5)
WBC: 11.6 10*3/uL — ABNORMAL HIGH (ref 4.0–10.5)

## 2016-07-26 MED ORDER — GLIMEPIRIDE 4 MG PO TABS
2.0000 mg | ORAL_TABLET | Freq: Two times a day (BID) | ORAL | Status: DC
  Administered 2016-07-26 – 2016-07-27 (×3): 2 mg via ORAL
  Filled 2016-07-26 (×3): qty 1

## 2016-07-26 MED ORDER — METOPROLOL TARTRATE 25 MG PO TABS
25.0000 mg | ORAL_TABLET | Freq: Two times a day (BID) | ORAL | Status: DC
  Administered 2016-07-26 – 2016-07-27 (×3): 25 mg via ORAL
  Filled 2016-07-26 (×3): qty 1

## 2016-07-26 MED ORDER — METOPROLOL TARTRATE 25 MG/10 ML ORAL SUSPENSION: 25.0000 mg | Freq: Two times a day (BID) | ORAL | Status: DC

## 2016-07-26 MED ORDER — POLYSACCHARIDE IRON COMPLEX 150 MG PO CAPS
150.0000 mg | ORAL_CAPSULE | Freq: Every day | ORAL | Status: DC
  Administered 2016-07-26 – 2016-07-27 (×2): 150 mg via ORAL
  Filled 2016-07-26 (×2): qty 1

## 2016-07-26 MED FILL — Mannitol IV Soln 20%: INTRAVENOUS | Qty: 500 | Status: AC

## 2016-07-26 MED FILL — Sodium Bicarbonate IV Soln 8.4%: INTRAVENOUS | Qty: 50 | Status: AC

## 2016-07-26 MED FILL — Electrolyte-R (PH 7.4) Solution: INTRAVENOUS | Qty: 4000 | Status: AC

## 2016-07-26 MED FILL — Sodium Chloride IV Soln 0.9%: INTRAVENOUS | Qty: 2000 | Status: AC

## 2016-07-26 MED FILL — Heparin Sodium (Porcine) Inj 1000 Unit/ML: INTRAMUSCULAR | Qty: 20 | Status: AC

## 2016-07-26 NOTE — Progress Notes (Signed)
Placed pt on cpap 10 cm with nasal mask.

## 2016-07-26 NOTE — Progress Notes (Signed)
CARDIAC REHAB PHASE I   PRE:  Rate/Rhythm: 97 SR    BP: sitting 140/73    SaO2: 91-92 RA  MODE:  Ambulation: 700 ft   POST:  Rate/Rhythm: 110 ST    BP: sitting 140/71     SaO2: 87-90 1L  Pt moving independently. No c/o. SaO2 91-92 RA at rest but SaO2 down to 86 RA walking. Applied 1L and SaO2 87-90 1L. Some SOB. Increased distance. To recliner. Able to stay on RA at rest. Encouraged more IS (it has not been close to her) and walk x2. Will f/u tomorrow. [place], ACSM 07/26/2016 11:32 AM

## 2016-07-26 NOTE — Progress Notes (Signed)
      [address]       [place] [address]             [phone]        4 Days Post-Op Procedure(s) (LRB): CORONARY ARTERY BYPASS GRAFTING (CABG) x three , using left internal mammary artery and right leg greater saphenous vein harvested endoscopically (N/A) TRANSESOPHAGEAL ECHOCARDIOGRAM (TEE) (N/A)  Subjective: Patient with no specific complaints this am. She is about to eat breakfast.  Objective: Vital signs in last 24 hours: Temp:  [97.9 F (36.6 C)-98.2 F (36.8 C)] 98.1 F (36.7 C) (10/16 0550) Pulse Rate:  [74-100] 98 (10/16 0748) Cardiac Rhythm: Normal sinus rhythm (10/16 0700) Resp:  [12-21] 16 (10/16 0748) BP: (93-132)/(57-104) 130/81 (10/16 0550) SpO2:  [95 %-100 %] 98 % (10/16 0748) Weight:  [245 lb 8 oz (111.4 kg)] 245 lb 8 oz (111.4 kg) (10/16 0410)  Pre op weight  108 kg Current Weight  07/26/16 245 lb 8 oz (111.4 kg)      Intake/Output from previous day: 10/15 0701 - 10/16 0700 In: 240 [P.O.:240] Out: 3450 [Urine:3450]   Physical Exam:  Cardiovascular: RRR Pulmonary: Diminished at bases Abdomen: Soft, non tender, bowel sounds present. Extremities: Bilateral lower extremity edema. Wounds: RLE wound is clean and dry. Aquacel intact-will be removed today  Lab Results: CBC: Recent Labs  07/25/16 0405 07/26/16 0243  WBC 15.8* 11.6*  HGB 9.3* 9.1*  HCT 27.9* 27.7*  PLT 213 229   BMET:  Recent Labs  07/25/16 0405 07/26/16 0243  NA 133* 135  K 4.2 4.4  CL 96* 97*  CO2 28 31  GLUCOSE 138* 114*  BUN 30* 28*  CREATININE 0.98 0.87  CALCIUM 8.8* 9.0    PT/INR:  Lab Results  Component Value Date   INR 1.33 07/22/2016   INR 1.0 RATIO 09/04/2008   ABG:  INR: Will add last result for INR, ABG once components are confirmed Will add last 4 CBG results once components are confirmed  Assessment/Plan:  1. CV - Previous intermittent a fib. Maintaining SR in the 90's. On Amiodarone 400 mg bid, Lopressor 12.5 mg bid.  Will increase to 25 mg bid. Will start low dose ACE in am if BP allows 2.  Pulmonary - On 3 liters of oxygen via [place]. Wean to room air as tolerates. CXR this am shows no pneumothorax, opacity LUL, and bibasilar atelectasis. Encourage incentive spirometer. 3. Volume Overload - On Lasix 40 mg IV daily. Will change to oral in am 4.  Acute blood loss anemia - H and H stable at 9.1 and 27.7. Will start Niferex 5. DM-CBGs 166/132/108. On Insulin only. On Glimeperide 2 mg bid prior to surgery so will restart.Pre op HGA1C 7.1. 6. Remove EPW  ZIMMERMAN,DONIELLE MPA-C 07/26/2016,7:56 AM

## 2016-07-26 NOTE — Discharge Summary (Signed)
Physician Discharge Summary       [address]       [place] [address]             [phone]    Patient ID: [name] [name] MRN: [id number] DOB/AGE: 1955/11/17 60 y.o.  Admit date: 07/20/2016 Discharge date: 07/27/2016  Admission Diagnoses: 1.STEMI involving left circumflex coronary artery ([place]) 2. Multivessel CAD   [place] Diagnoses:  1. Severe essential hypertension 2. Chronic diastolic heart failure 3. OSA (obstructive sleep apnea) 4. Diabetes mellitus type 2, uncontrolled, with complications ([place]) 5. Hyperlipidemia 6. GERD (gastroesophageal reflux disease) 7. Obesity 8. Sinoatrial node dysfunction (HCC)-s/p Medtronic pacemaker 9. Anxiety 10. Depression 11. Tobacco abuse  Procedure (s):  Procedures   Left Heart Cath and Coronary Angiography by Dr. Martinique on 07/20/2016:  Conclusion     Ost 1st Sept to 1st Sept lesion, 90 %stenosed.  Prox LAD lesion, 90 %stenosed.  Mid LAD lesion, 80 %stenosed.  Dist LAD lesion, 70 %stenosed.  Ost Cx to Prox Cx lesion, 100 %stenosed.  2nd RPLB lesion, 100 %stenosed.  The left ventricular systolic function is normal.  LV end diastolic pressure is moderately elevated.  The left ventricular ejection fraction is 50-55% by visual estimate.   1. Severe 3 vessel obstructive CAD.    - there is complex proximal to mid LAD disease at the takeoff of a large septal perforator and 2 large diagonal branches.    - 100% occlusion of the proximal LCx- culprit vessel. This fills by right to left collaterals.    -100% occlusion of a small PL branch of the RCA that fills by left to right collaterals. 2. Overall good LV function 3. Moderately elevated LVEDP  Plan: patient is currently pain free. Will transfer to ICU and continue IV Ntg and heparin. The complex LAD disease make it unfavorable to treat her with PCI. Would recommend CT surgery consult for CABG.      1. Coronary artery bypass grafting x3 (left  internal mammary artery to     LAD, saphenous vein graft to diagonal, saphenous vein graft to     obtuse marginal). 2. Endoscopic harvest of right leg greater saphenous vein by Dr. Prescott Gum on 07/22/2016.  History of Presenting Illness: This is a 60 year old type II diabetic nonsmoker presented with unstable anginal symptoms and positive cardiac enzymes. Urgent cardiac catheterization by Dr. 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 LV systolic function was fairly well preserved, LVEDP 26 mmHg. On presentation, her lactic acid was elevated but that has returned to normal. Her troponins have peak and now are regressing. She ruled in for a STEMI. She had 1 episode of chest pain on IV heparin last night. She is planned for surgical coronary revascularization which was recommended by her cardiologist and Dr. Prescott Gum agreed with that recommendation. Of note, patient had a cardiac catheterization in the past which apparently was negative. Cardiac catheterization via right radial artery was performed without hematoma or bleeding the patient has had a previous dual-chamber pacemaker placed for slow heart rate. Dr. Prescott Gum discussed potential risks, benefits, and complications of coronary artery bypass grafting surgery. Patient agreed to proceed with surgery. Pre operative carotid duplex showed no significant internal carotid artery stenosis bilaterally. She underwent a CABG x 3 on 07/22/2016.   Brief Hospital Course:  The patient was extubated early post operative day one without difficulty. He/she remained afebrile and hemodynamically stable. She was weaned off of Milrinone drip. Gordy Councilman,  a line, chest tubes, and foley were removed early in the post operative course. Lopressor was started and titrated accordingly. She did have intermittent a fib. She was started on Amiodarone. She was volume over loaded and diuresed. She had ABL anemia. She did not require a post op  transfusion. Last H and H was 9.1 and 27.7. She was weaned off the insulin drip. The patient's HGA1C pre op was 7.1 . Once she was tolerating a diet, Glimepiride was restarted.  The patient's glucose remained well controlled. The patient was felt surgically stable for transfer from the ICU to PCTU for further convalescence on 07/25/2016. She continues to progress with cardiac rehab. She was ambulating on 2 liters of oxygen via [place]. We will attempt to wean her to room air. She has been tolerating a diet and has had a bowel movement. Epicardial pacing wires were removed on 07/26/2016. Chest tube sutures will be removed today. As discussed with Dr. Prescott Gum, the patient is felt surgically stable for discharge today.  Latest Vital Signs: Blood pressure (!) 141/85, pulse 75, temperature 97.6 F (36.4 C), temperature source Oral, resp. rate 18, height 5\' 2"  (1.575 m), weight 243 lb 3.2 oz (110.3 kg), SpO2 99 %.  Physical Exam: Cardiovascular: RRR Pulmonary: Slightly dminished at bases Abdomen: Soft, non tender, bowel sounds present. Extremities: Bilateral lower extremity edema. Wounds: Sternal and RLE wounds are clean and dry.   Discharge Condition:Stable and discharged to home.  Recent laboratory studies:  Lab Results  Component Value Date   WBC 11.6 (H) 07/26/2016   HGB 9.1 (L) 07/26/2016   HCT 27.7 (L) 07/26/2016   MCV 92.0 07/26/2016   PLT 229 07/26/2016   Lab Results  Component Value Date   NA 135 07/26/2016   K 4.4 07/26/2016   CL 97 (L) 07/26/2016   CO2 31 07/26/2016   CREATININE 0.87 07/26/2016   GLUCOSE 114 (H) 07/26/2016    Diagnostic Studies: Dg Chest 2 View  Result Date: 07/26/2016 CLINICAL DATA:  Chest pain.  Recent coronary artery bypass grafting EXAM: CHEST  2 VIEW COMPARISON:  July 20, 2016 and July 25, 2016 FINDINGS: Cordis has been removed. Pacemaker lead tips are attached to the right atrium and right ventricle. No pneumothorax. There is a nodular opacity in  the left upper lobe which on previous studies has apparently been obscured by the pacemaker device. This opacity measures 3.0 x 2.6 cm and is appreciable only on the frontal view. No new parenchymal lung opacity is seen. There is no consolidation. There is bibasilar atelectasis. Relative lucency in the right hilar region persists of uncertain etiology. There is stable cardiomegaly. There is atherosclerotic calcification in the aorta. No adenopathy. The pulmonary vascularity appears normal. IMPRESSION: 3.0 x 2.6 cm nodular opacity left upper lobe which on prior studies has been obscured by pacemaker device. Advise noncontrast enhanced chest CT to further assess. Stable cardiac silhouette. Bibasilar atelectasis. Aortic atherosclerosis noted. Pacemaker leads attached to right atrium and right ventricle. These results will be called to the ordering clinician or representative by the Radiologist Assistant, and communication documented in the PACS or zVision Dashboard. Electronically Signed   By: Lowella Grip III M.D.   On: 07/26/2016 07:41    Discharge Medications:   Medication List    STOP taking these medications   lisinopril-hydrochlorothiazide 20-12.5 MG tablet Commonly known as:  PRINZIDE,ZESTORETIC   losartan 100 MG tablet Commonly known as:  COZAAR     TAKE these medications   albuterol  108 (90 Base) MCG/ACT inhaler Commonly known as:  PROAIR HFA Inhale 2 puffs into the lungs as needed.   amiodarone 200 MG tablet Commonly known as:  PACERONE Take 1 tablet (200 mg total) by mouth 2 (two) times daily. For one week then take Amiodarone 200 mg daily by mouth thereafter   aspirin 325 MG EC tablet Take 1 tablet (325 mg total) by mouth daily.   atorvastatin 80 MG tablet Commonly known as:  LIPITOR Take 1 tablet (80 mg total) by mouth daily at 6 PM.   cetirizine 10 MG tablet Commonly known as:  ZYRTEC Take 10 mg by mouth daily.   citalopram 20 MG tablet Commonly known as:   CELEXA TAKE 1 BY MOUTH DAILY   dicyclomine 20 MG tablet Commonly known as:  BENTYL Take 20 mg by mouth 3 (three) times daily.   fish oil-omega-3 fatty acids 1000 MG capsule Take 2 g by mouth daily. PER PATIENT TAKE 8000 MG DAILY   fluticasone 110 MCG/ACT inhaler Commonly known as:  FLOVENT HFA Inhale 2 puffs into the lungs 2 (two) times daily.   fluticasone 50 MCG/ACT nasal spray Commonly known as:  FLONASE Place 2 sprays into both nostrils daily.   furosemide 40 MG tablet Commonly known as:  LASIX Take 1 tablet (40 mg total) by mouth daily. For 2 weeks then stop.   gabapentin 300 MG capsule Commonly known as:  NEURONTIN TAKE 1 BY MOUTH IN THE MORNING AND 2 AT BEDTIME   glimepiride 4 MG tablet Commonly known as:  AMARYL Take 1/2 tablet twice a day before main meals.   glucose blood test strip Use as instructed   hydrOXYzine 25 MG tablet Commonly known as:  ATARAX/VISTARIL Take 1 tablet (25 mg total) by mouth every 8 (eight) hours as needed. What changed:  reasons to take this   iron polysaccharides 150 MG capsule Commonly known as:  NIFEREX Take 1 capsule (150 mg total) by mouth daily. For one month then stop.   lisinopril 5 MG tablet Commonly known as:  PRINIVIL,ZESTRIL Take 1 tablet (5 mg total) by mouth daily.   metoprolol succinate 50 MG 24 hr tablet Commonly known as:  TOPROL-XL Take 1 tablet (50 mg total) by mouth daily. Take with or immediately following a meal. What changed:  how much to take   multivitamin tablet Take 1 tablet by mouth daily. Start taking on:  08/30/2016   omeprazole 20 MG capsule Commonly known as:  PRILOSEC TAKE 1 BY MOUTH DAILY   ondansetron 8 MG disintegrating tablet Commonly known as:  ZOFRAN-ODT DISSOLVE 1 ON TONGUE EVERY 12 HOURS AS NEEDED FOR NAUSEA   potassium chloride SA 20 MEQ tablet Commonly known as:  K-DUR,KLOR-CON Take 1 tablet (20 mEq total) by mouth daily. For 2 weeks then stop.   traMADol 50 MG  tablet Commonly known as:  ULTRAM Take one or two by mouth every 4-6 hours PRN moderate pain What changed:  how much to take  how to take this  when to take this  reasons to take this  additional instructions   triamcinolone cream 0.1 % Commonly known as:  KENALOG Apply 1 application topically as needed.   Vitamin D 2000 units Caps Take by mouth.      The patient has been discharged on:   1.Beta Blocker:  Yes [ x  ]  No   [   ]                              If No, reason:  2.Ace Inhibitor/ARB: Yes [ x  ]                                     No  [    ]                                     If No, reason:  3.Statin:   Yes [ x  ]                  No  [   ]                  If No, reason:  4.Ecasa:  Yes  [ x  ]                  No   [   ]                  If No, reason:  Follow Up Appointments: Follow-up Information    Virl Axe, MD .   Specialty:  Cardiology Why:  Call for a follow up appointment for 2 weeks Contact information: [id number] [address] [place] [address] [phone]        Tharon Aquas Kerby Less III, MD Follow up on 09/01/2016.   Specialty:  Cardiothoracic Surgery Why:  PA/LAT CXR to be taken (at [facility] which is in the same building as Dr. Lucianne Lei Trigt's office) on 08/26/2016 at 10:30 am;Appointment time is at 11:00 am Contact information: [address] [place] [address] [phone]           Signed: Lars Pinks MPA-C 07/27/2016, 8:32 AM

## 2016-07-26 NOTE — Discharge Instructions (Signed)
Coronary Artery Bypass Grafting, Care After °Refer to this sheet in the next few weeks. These instructions provide you with information on caring for yourself after your procedure. Your health care provider may also give you more specific instructions. Your treatment has been planned according to current medical practices, but problems sometimes occur. Call your health care provider if you have any problems or questions after your procedure. °WHAT TO EXPECT AFTER THE PROCEDURE °Recovery from surgery will be different for everyone. Some people feel well after 3 or 4 weeks, while for others it takes longer. After your procedure, it is typical to have the following: °· Nausea and a lack of appetite.   °· Constipation. °· Weakness and fatigue.   °· Depression or irritability.   °· Pain or discomfort at your incision site. °HOME CARE INSTRUCTIONS °· Take medicines only as directed by your health care provider. Do not stop taking medicines or start any new medicines without first checking with your health care provider. °· Take your pulse as directed by your health care provider. °· Perform deep breathing as directed by your health care provider. If you were given a device called an incentive spirometer, use it to practice deep breathing several times a day. Support your chest with a pillow or your arms when you take deep breaths or cough. °· Keep incision areas clean, dry, and protected. Remove or change any bandages (dressings) only as directed by your health care provider. You may have skin adhesive strips over the incision areas. Do not take the strips off. They will fall off on their own. °· Check incision areas daily for any swelling, redness, or drainage. °· If incisions were made in your legs, do the following: °¨ Avoid crossing your legs.   °¨ Avoid sitting for long periods of time. Change positions every 30 minutes.   °¨ Elevate your legs when you are sitting. °· Wear compression stockings as directed by your  health care provider. These stockings help keep blood clots from forming in your legs. °· Take showers once your health care provider approves. Until then, only take sponge baths. Pat incisions dry. Do not rub incisions with a washcloth or towel. Do not take baths, swim, or use a hot tub until your health care provider approves. °· Eat foods that are high in fiber, such as raw fruits and vegetables, whole grains, beans, and nuts. Meats should be lean cut. Avoid canned, processed, and fried foods. °· Drink enough fluid to keep your urine clear or pale yellow. °· Weigh yourself every day. This helps identify if you are retaining fluid that may make your heart and lungs work harder. °· Rest and limit activity as directed by your health care provider. You may be instructed to: °¨ Stop any activity at once if you have chest pain, shortness of breath, irregular heartbeats, or dizziness. Get help right away if you have any of these symptoms. °¨ Move around frequently for short periods or take short walks as directed by your health care provider. Increase your activities gradually. You may need physical therapy or cardiac rehabilitation to help strengthen your muscles and build your endurance. °¨ Avoid lifting, pushing, or pulling anything heavier than 10 lb (4.5 kg) for at least 6 weeks after surgery. °· Do not drive until your health care provider approves.  °· Ask your health care provider when you may return to work. °· Ask your health care provider when you may resume sexual activity. °· Keep all follow-up visits as directed by your health care   provider. This is important. °SEEK MEDICAL CARE IF: °· You have swelling, redness, increasing pain, or drainage at the site of an incision. °· You have a fever. °· You have swelling in your ankles or legs. °· You have pain in your legs.   °· You gain 2 or more pounds (0.9 kg) a day. °· You are nauseous or vomit. °· You have diarrhea.  °SEEK IMMEDIATE MEDICAL CARE IF: °· You have  chest pain that goes to your jaw or arms. °· You have shortness of breath.   °· You have a fast or irregular heartbeat.   °· You notice a "clicking" in your breastbone (sternum) when you move.   °· You have numbness or weakness in your arms or legs. °· You feel dizzy or light-headed.   °MAKE SURE YOU: °· Understand these instructions. °· Will watch your condition. °· Will get help right away if you are not doing well or get worse. °  °This information is not intended to replace advice given to you by your health care provider. Make sure you discuss any questions you have with your health care provider. °  °Document Released: 04/16/2005 Document Revised: 10/18/2014 Document Reviewed: 03/06/2013 °Elsevier Interactive Patient Education ©2016 [employer] ° °

## 2016-07-26 NOTE — Progress Notes (Signed)
Pt ambulated 36ft on 2L O2. Pt tolerated it well. Will contine to monitor

## 2016-07-26 NOTE — Progress Notes (Signed)
EPW removed. Pt tolerated well. Pt educated on hour bed rest and vital signs taken. Pt instructed to call nurse if needed. Will continue to monitor.

## 2016-07-26 NOTE — Op Note (Signed)
[name] [name], BIBLE NO.:  [id number]  MEDICAL RECORD NO.:  [id number]  LOCATION:                                 FACILITY:  PHYSICIAN:  Ivin Poot, M.D.  DATE OF BIRTH:  1956/08/17  DATE OF PROCEDURE:  07/22/2016 DATE OF DISCHARGE:                              OPERATIVE REPORT   OPERATION: 1. Coronary artery bypass grafting x3 (left internal mammary artery to     LAD, saphenous vein graft to diagonal, saphenous vein graft to     obtuse marginal). 2. Endoscopic harvest of right leg greater saphenous vein.  SURGEON:  Ivin Poot, M.D.  ASSISTANT:  John Giovanni, P.A.-C. and Nicholes Rough, PA-C.  ANESTHESIA:  General by Dr. Gifford Shave.  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  CLINICAL NOTE:  The patient is a 60 year old, morbidly obese female, who presents with severe chest pain with minimal exertion and positive cardiac enzymes.  Cardiac catheterization by Dr. Martinique demonstrated severe coronary artery disease and surgical coronary revascularization was recommended.  She was maintained on IV heparin and nitroglycerin, but still had some chest pain at rest.  She was added to the operative schedule for urgent surgical coronary revascularization.  Prior to surgery, I examined the patient and her CCU room and reviewed the results of the cardiac catheterization with the patient and her family.  I discussed the indications and expected benefits of CABG for treatment of her severe CAD.  I reviewed the major aspects of the operation including the use of general anesthesia and cardiopulmonary bypass, the location of the surgical incisions, and the expected postoperative hospital recovery.  I discussed with the patient the risks to her of CABG including risks of stroke, MI, bleeding, blood transfusion  requirement, postoperative pulmonary problems including pleural effusion, infection, and death.  After reviewing these issues, she demonstrated her understanding and agreed to proceed with surgery under what I felt was an informed consent.  OPERATIVE FINDINGS: 1. Severe coronary artery disease with exposure difficult to the     patient due to the patient's short obese body habitus. 2. Adequate conduit for grafts. 3. Preserved LV systolic function after separation of cardiopulmonary     bypass by TEE.  CLINICAL NOTE:  The patient is a 60 year old type 2 diabetic, nonsmoker, who presented with unstable angina and positive cardiac enzymes consistent with ST-elevation MI.  Surgical coronary revascularization was recommended by her cardiologist, Dr. Peter Martinique.  She had total occlusion of her circumflex and high-grade stenosis of the diagonal and LAD.  The patient had breakthrough chest pain even on IV heparin and nitroglycerin.  On the day of surgery, she was brought to the preoperative holding area, where she was prepared for surgery by the Anesthesia Team.  I examined the patient in the preop preparation area. Sign the consent and answered her any final questions.  PROCEDURE IN DETAIL:  The patient was then brought directly to the operating room and placed supine on the operating table.  General anesthesia was induced.  A transesophageal echo probe was placed by the anesthesiologist.  The patient's chest, abdomen, and legs were prepped with Betadine and draped as a sterile field.  A sternal incision was made as the saphenous vein was harvested endoscopically from the right leg.  The left internal mammary artery was harvested, from the left chest wall.  This was tedious and time consuming because of her body habitus and difficult exposure of the mammary artery location.  The sternum was then retracted using the deep blades of the sternal retractor.  The pericardium was opened  and suspended.  Pursestrings were placed in ascending aorta and right atrium.  When heparin was administered, the ACT was documented as being therapeutic and the patient was then cannulated and placed on cardiopulmonary bypass.  The coronaries were identified for grafting and the mammary artery and vein grafts were prepared for the distal anastomoses.  Cardioplegia cannulas were placed both antegrade and retrograde cold blood cardioplegia.  The patient was cooled to 32 degrees and an aortic crossclamp was applied.  A liter of cold blood cardioplegia was delivered in split doses between the antegrade aortic and retrograde coronary sinus catheters.  There was good cardioplegic arrest, and septal temperature dropped less than 14 degrees. Cardioplegia was delivered every 20 minutes or less while the crossclamp was placed.  The distal coronary anastomoses were performed.  The first distal anastomosis was to the circumflex marginal branch of the left coronary. This had been occluded.  There was a small, but graftable vessel.  A reverse saphenous vein was sewn end-to-side with running 7-0 Prolene good flow through the graft.  Cardioplegia was redosed.  The second distal anastomosis was to the diagonal branch to LAD.  This was a 1.5-mm vessel.  There was a smaller more proximal diagonal, which was too small to graft.  A reverse saphenous vein was sewn end-to-side with running 7-0 Prolene and there was good flow through the graft. Cardioplegia was redosed.  The third distal anastomosis was to the midportion of the LAD. Distally, the LAD was fairly small.  There was a proximal 90-95% stenosis.  The left IMA pedicle was brought through an opening in the left lateral pericardium was brought down onto the LAD and sewn end-to- side with running 8-0 Prolene.  There was good flow through the anastomosis after briefly releasing the pedicle bulldog on the mammary artery.  The bulldog was reapplied and  the pedicle was secured to the epicardium.  Cardioplegia was redosed.  With the crossclamp still in place, 2 proximal vein anastomoses were performed on the ascending aorta using a 4.5 mm punch running 6-0 Prolene.  Prior to releasing the crossclamp, air was vented from the coronaries with a dose of retrograde warm blood cardioplegia.  The crossclamp was removed.  The heart resumed a spontaneous rhythm.  The vein grafts were de-aired and opened, each had good flow.  Hemostasis was documented at the proximal and distal anastomoses.  The patient was rewarmed and reperfused.  Temporary pacing wires were applied.  The lungs were expanded and ventilator was resumed.  When the patient was adequately rewarmed and reperfused, she was weaned from cardiopulmonary bypass without difficulty.  Minimal inotropics support was required.  Echo showed preserved and good LV systolic function.  Protamine was administered without adverse reaction.  The cannulas removed.  The mediastinum was irrigated with warm saline.  The superior pericardial fat was closed over the aorta.  Anterior mediastinal and left pleural chest tubes were placed and brought out through separate incisions.  The sternum was closed with wire.  The pectoralis fascia was closed in a running #1 Vicryl.  The subcutaneous and skin layers were closed in running Vicryl.  Total cardiopulmonary bypass time was 120 minutes.     Ivin Poot, M.D.   ______________________________ Ivin Poot, M.D.    PV/MEDQ  D:  07/24/2016  T:  07/25/2016  Job:  [id number]  cc:   Peter M. Martinique, M.D.

## 2016-07-27 ENCOUNTER — Encounter: Payer: BLUE CROSS/BLUE SHIELD | Admitting: *Deleted

## 2016-07-27 ENCOUNTER — Telehealth: Payer: Self-pay | Admitting: Cardiology

## 2016-07-27 LAB — GLUCOSE, CAPILLARY
GLUCOSE-CAPILLARY: 63 mg/dL — AB (ref 65–99)
Glucose-Capillary: 64 mg/dL — ABNORMAL LOW (ref 65–99)
Glucose-Capillary: 153 mg/dL — ABNORMAL HIGH (ref 65–99)
Glucose-Capillary: 158 mg/dL — ABNORMAL HIGH (ref 65–99)

## 2016-07-27 MED ORDER — LISINOPRIL 5 MG PO TABS
5.0000 mg | ORAL_TABLET | Freq: Every day | ORAL | Status: DC
  Administered 2016-07-27: 5 mg via ORAL
  Filled 2016-07-27: qty 1

## 2016-07-27 MED ORDER — POTASSIUM CHLORIDE CRYS ER 20 MEQ PO TBCR: 20.0000 meq | EXTENDED_RELEASE_TABLET | Freq: Every day | ORAL | 0 refills | Status: DC

## 2016-07-27 MED ORDER — ONE-DAILY MULTI VITAMINS PO TABS: 1.0000 | ORAL_TABLET | Freq: Every day | ORAL | Status: AC

## 2016-07-27 MED ORDER — FUROSEMIDE 40 MG PO TABS
40.0000 mg | ORAL_TABLET | Freq: Every day | ORAL | Status: DC
  Administered 2016-07-27: 40 mg via ORAL
  Filled 2016-07-27: qty 2

## 2016-07-27 MED ORDER — POLYSACCHARIDE IRON COMPLEX 150 MG PO CAPS: 150.0000 mg | ORAL_CAPSULE | Freq: Every day | ORAL | 0 refills | Status: DC

## 2016-07-27 MED ORDER — ATORVASTATIN CALCIUM 80 MG PO TABS: 80.0000 mg | ORAL_TABLET | Freq: Every day | ORAL | 1 refills | Status: DC

## 2016-07-27 MED ORDER — TRAMADOL HCL 50 MG PO TABS
ORAL_TABLET | ORAL | 0 refills | Status: DC
Start: 2016-07-27 — End: 2016-12-15

## 2016-07-27 MED ORDER — FUROSEMIDE 40 MG PO TABS: 40.0000 mg | ORAL_TABLET | Freq: Every day | ORAL | 0 refills | Status: DC

## 2016-07-27 MED ORDER — LISINOPRIL 5 MG PO TABS: 5.0000 mg | ORAL_TABLET | Freq: Every day | ORAL | 1 refills | Status: DC

## 2016-07-27 MED ORDER — ASPIRIN 325 MG PO TBEC: 325.0000 mg | DELAYED_RELEASE_TABLET | Freq: Every day | ORAL | 0 refills | Status: DC

## 2016-07-27 MED ORDER — METOPROLOL SUCCINATE ER 50 MG PO TB24: 50.0000 mg | ORAL_TABLET | Freq: Every day | ORAL | 1 refills | Status: DC

## 2016-07-27 MED ORDER — AMIODARONE HCL 200 MG PO TABS
200.0000 mg | ORAL_TABLET | Freq: Two times a day (BID) | ORAL | 1 refills | Status: DC
Start: 2016-07-27 — End: 2016-08-09

## 2016-07-27 NOTE — Telephone Encounter (Signed)
LMOVM reminding pt to send remote transmission.   

## 2016-07-27 NOTE — Progress Notes (Signed)
CARDIAC REHAB PHASE I   PRE:  Rate/Rhythm: 93 SR  BP:  Sitting: 155/75        SaO2: 94 RA  MODE:  Ambulation: 700 ft   POST:  Rate/Rhythm: 102 ST  BP:  Sitting: 153/75         SaO2: 94 RA  Pt ambulated 700 ft on RA, independent, steady gait, tolerated well. Pt c/o mild DOE, denies any other complaints, O2 sats 92-94% on RA during ambulation. Cardiac surgery discharge education completed. Reviewed risk factors, IS, sternal precautions, activity progression, exercise guidelines, heart healthy diet, carb counting, daily weights, and phase 2 cardiac rehab. Pt verbalized understanding, receptive to education. Pt agrees to phase 2 cardiac rehab referral, will send to [facility] per pt request. Pt to recliner after walk, feet elevated, call bell within reach.   PT:[phone] Lenna Sciara, RN, BSN 07/27/2016 9:01 AM

## 2016-07-27 NOTE — Care Management Note (Signed)
Case Management Note Marvetta Gibbons RN, BSN Unit 2W-Case Manager [phone]  Patient Details  Name: [name] [name] MRN: [id number] Date of Birth: 1956/06/26  Subjective/Objective:  Pt admitted with NSTEMI, s/p CABGx3- tx from ICU to 2W on 07/25/16                   Action/Plan: PTA pt lived at home- plan to return home- no CM needs noted.   Expected Discharge Date:    07/27/16              Expected Discharge Plan:  Home/Self Care  In-House Referral:     Discharge planning Services  CM Consult  Post Acute Care Choice:    Choice offered to:     DME Arranged:    DME Agency:     HH Arranged:    HH Agency:     Status of Service:  Completed, signed off  If discussed at [place] of Stay Meetings, dates discussed:    Discharge Disposition: Home/self care   Additional Comments:  Dawayne Patricia, RN 07/27/2016, 10:23 AM

## 2016-07-27 NOTE — Progress Notes (Addendum)
      [address]       [place] [address]             [phone]        5 Days Post-Op Procedure(s) (LRB): CORONARY ARTERY BYPASS GRAFTING (CABG) x three , using left internal mammary artery and right leg greater saphenous vein harvested endoscopically (N/A) TRANSESOPHAGEAL ECHOCARDIOGRAM (TEE) (N/A)  Subjective: Patient asleep and was awakened. She has no complaints.  Objective: Vital signs in last 24 hours: Temp:  [97.6 F (36.4 C)-98.6 F (37 C)] 97.6 F (36.4 C) (10/17 0407) Pulse Rate:  [72-98] 75 (10/17 0407) Cardiac Rhythm: Normal sinus rhythm;Heart block (10/16 1900) Resp:  [16-18] 18 (10/17 0407) BP: (131-141)/(71-85) 141/85 (10/17 0407) SpO2:  [91 %-98 %] 92 % (10/17 0407) Weight:  [243 lb 3.2 oz (110.3 kg)] 243 lb 3.2 oz (110.3 kg) (10/17 0407)  Pre op weight  108 kg Current Weight  07/27/16 243 lb 3.2 oz (110.3 kg)      Intake/Output from previous day: 10/16 0701 - 10/17 0700 In: 720 [P.O.:720] Out: 2900 [Urine:2900]   Physical Exam:  Cardiovascular: RRR Pulmonary: Slightly diminished at bases Abdomen: Soft, non tender, bowel sounds present. Extremities: Bilateral lower extremity edema. Wounds: Sternal and RLE wounds are clean and dry  Lab Results: CBC:  Recent Labs  07/25/16 0405 07/26/16 0243  WBC 15.8* 11.6*  HGB 9.3* 9.1*  HCT 27.9* 27.7*  PLT 213 229   BMET:   Recent Labs  07/25/16 0405 07/26/16 0243  NA 133* 135  K 4.2 4.4  CL 96* 97*  CO2 28 31  GLUCOSE 138* 114*  BUN 30* 28*  CREATININE 0.98 0.87  CALCIUM 8.8* 9.0    PT/INR:  Lab Results  Component Value Date   INR 1.33 07/22/2016   INR 1.0 RATIO 09/04/2008   ABG:  INR: Will add last result for INR, ABG once components are confirmed Will add last 4 CBG results once components are confirmed  Assessment/Plan:  1. CV - Previous intermittent a fib. Maintaining SR in the 90's. On Amiodarone 400 mg bid, Lopressor 25 mg bid.  Will restart low  dose ACE . 2.  Pulmonary - On room air. Wean to room air as tolerates.  Encourage incentive spirometer. 3. Volume Overload - Has diuresed well on Lasix 40 mg IV daily. Will change to oral  4.  Acute blood loss anemia - H and H stable at 9.1 and 27.7. Will start Niferex 5. DM-CBGs 99/64/153. On Insulin only. Continue Glimeperide 2 mg bid prior to surgery.Pre op HGA1C 7.1. 6.Discharge   Dionne Rossa MPA-C 07/27/2016,7:20 AM

## 2016-07-30 ENCOUNTER — Encounter: Payer: Self-pay | Admitting: Cardiology

## 2016-08-09 ENCOUNTER — Encounter: Payer: Self-pay | Admitting: Internal Medicine

## 2016-08-09 ENCOUNTER — Ambulatory Visit (INDEPENDENT_AMBULATORY_CARE_PROVIDER_SITE_OTHER): Payer: BLUE CROSS/BLUE SHIELD | Admitting: Internal Medicine

## 2016-08-09 ENCOUNTER — Ambulatory Visit (INDEPENDENT_AMBULATORY_CARE_PROVIDER_SITE_OTHER): Payer: Self-pay | Admitting: Physician Assistant

## 2016-08-09 VITALS — BP 108/60 | HR 78 | Ht 62.0 in | Wt 235.4 lb

## 2016-08-09 VITALS — BP 109/69 | HR 76 | Resp 20 | Ht 62.0 in | Wt 235.0 lb

## 2016-08-09 DIAGNOSIS — I495 Sick sinus syndrome: Secondary | ICD-10-CM

## 2016-08-09 DIAGNOSIS — Z951 Presence of aortocoronary bypass graft: Secondary | ICD-10-CM

## 2016-08-09 DIAGNOSIS — I2510 Atherosclerotic heart disease of native coronary artery without angina pectoris: Secondary | ICD-10-CM

## 2016-08-09 DIAGNOSIS — Z950 Presence of cardiac pacemaker: Secondary | ICD-10-CM

## 2016-08-09 NOTE — Progress Notes (Signed)
HPI:  Patient is S/P CABG performed on 07/24/2016.  She was referred to the office today after a visit with Dr. Caryl Comes.  He was concerned about her chest tube sties being open.  The patient states that her chest tube sites were not sutured closed and have been open like this since discharge.   Current Outpatient Prescriptions  Medication Sig Dispense Refill  . albuterol (PROVENTIL HFA;VENTOLIN HFA) 108 (90 Base) MCG/ACT inhaler Inhale into the lungs every 6 (six) hours as needed for wheezing or shortness of breath.    Marland Kitchen aspirin EC 325 MG EC tablet Take 1 tablet (325 mg total) by mouth daily. 30 tablet 0  . atorvastatin (LIPITOR) 80 MG tablet Take 1 tablet (80 mg total) by mouth daily at 6 PM. 30 tablet 1  . cetirizine (ZYRTEC) 10 MG tablet Take 10 mg by mouth daily.      . Cholecalciferol (VITAMIN D) 2000 UNITS CAPS Take 1 capsule by mouth daily as needed (vitamin supplement).     . citalopram (CELEXA) 20 MG tablet Take 1 tablet by mouth daily    . dicyclomine (BENTYL) 20 MG tablet Take 20 mg by mouth 3 (three) times daily.    . fish oil-omega-3 fatty acids 1000 MG capsule Take 2 g by mouth daily.     . fluticasone (FLONASE) 50 MCG/ACT nasal spray Place 2 sprays into both nostrils daily. 48 g 3  . fluticasone (FLOVENT HFA) 110 MCG/ACT inhaler Inhale 2 puffs into the lungs 2 (two) times daily. 1 Inhaler 12  . furosemide (LASIX) 40 MG tablet Take 1 tablet (40 mg total) by mouth daily. For 2 weeks then stop. 14 tablet 0  . gabapentin (NEURONTIN) 300 MG capsule Take 1 tablet by mouth in the AM and 2 tablets by mouth in the PM    . glimepiride (AMARYL) 4 MG tablet Take 0.5 tablet by mouth twice daily with meals    . glucose blood test strip Use as instructed 200 each 5  . hydrOXYzine (ATARAX/VISTARIL) 25 MG tablet Take 25 mg by mouth every 8 (eight) hours as needed for anxiety.    . iron polysaccharides (NIFEREX) 150 MG capsule Take 1 capsule (150 mg total) by mouth daily. For one month then stop.  30 capsule 0  . lisinopril (PRINIVIL,ZESTRIL) 5 MG tablet Take 1 tablet (5 mg total) by mouth daily. 30 tablet 1  . metoprolol succinate (TOPROL-XL) 50 MG 24 hr tablet Take 1 tablet (50 mg total) by mouth daily. Take with or immediately following a meal. 30 tablet 1  . [START ON 08/30/2016] Multiple Vitamin (MULTIVITAMIN) tablet Take 1 tablet by mouth daily.    Marland Kitchen omeprazole (PRILOSEC) 20 MG capsule Take 1 capsule by mouth daily    . ondansetron (ZOFRAN-ODT) 8 MG disintegrating tablet DISSOLVE 1 ON TONGUE EVERY 12 HOURS AS NEEDED FOR NAUSEA 30 tablet 3  . potassium chloride SA (K-DUR,KLOR-CON) 20 MEQ tablet Take 1 tablet (20 mEq total) by mouth daily. For 2 weeks then stop. 14 tablet 0  . traMADol (ULTRAM) 50 MG tablet Take one or two by mouth every 4-6 hours PRN moderate pain 10 tablet 0  . triamcinolone cream (KENALOG) 0.1 % Apply 1 application topically as needed. 30 g 2   No current facility-administered medications for this visit.     Physical Exam:  BP 109/69 (BP Location: Right Arm, Patient Position: Sitting, Cuff Size: Normal)   Pulse 76   Resp 20   Ht 5\' 2"  (1.575 m)  Wt 235 lb (106.6 kg)   SpO2 95%   BMI 42.98 kg/m   Gen: no apparent distress Heart: RRR Lungs: CTA Skin: incisions well healed... Chest tube sites are open, clean, no drainage or surrounding erythema present  A/P;  1. Normal appearing open chest tube sites-  Apparently they were not sutured at discharge.  Instructed patient to wash sites daily with soap and water.  Apply Bacitracin ointment and cover with a band aide.  I instructed patient they will take a few weeks to close fully 2. RTC as scheduled... However if purulence develops or chest tube sites become erythematous she is to contact us for earlier appointment  Ellwood Handler, PA-C [facility] [phone]

## 2016-08-09 NOTE — Patient Instructions (Signed)
Your physician has recommended you make the following change in your medication:  1.) stop amiodarone  Your physician recommends that you schedule a follow-up appointment in: 4 months with Tommye Standard, APP.

## 2016-08-09 NOTE — Progress Notes (Signed)
Patient Care Team: Pcp Not In System as PCP - [place], RD (Inactive) as Dietitian Deboraha Sprang, MD as Attending Physician (Cardiology) Marygrace Drought, MD as Consulting Physician (Ophthalmology) Vickey Huger, MD as Consulting Physician (Orthopedic Surgery)   HPI  [name] [name] is a 60 y.o. female With a history of Pacing for sinus node dysfunction. She paces almost never.  Admitted 10/17 w STEMI  Cath>>  Severe 3 vessel obstructive CAD.  complex proximal to mid LAD disease at the takeoff of a large septal perforator and 2 large diagonal branches. 100% occlusion of the proximal LCx- culprit vessel. This fills by right to left collaterals. 100% occlusion of a small PL branch of the RCA that fills by left to right collaterals. Preserved LV function  And underwent   . Coronary artery bypass grafting x3 (left internal mammary artery to LAD, saphenous vein graft to diagonal, saphenous vein graft to obtuse marginal).  Postoperative course complicated by atrial fibrillation she was started on amiodarone but not on anticoagulation  She feels much better. She is able to walk without shortness of breath. Her complaints related to pacer site wounds     She is using her CPAP.     Past Medical History:  Diagnosis Date  . Allergy   . Anal fissure   . Anxiety   . Arthritis   . Asthma   . Cataract 2010    left  . Colon polyps    diverticulosis  03-2011/2005  . Depression   . Diabetes mellitus 2012   T2DM  . Diverticulosis   . Fatty liver 04/2011  . GERD (gastroesophageal reflux disease)   . Hyperlipidemia   . Hypertension severe   . Migraine   . Obesity   . OSA (obstructive sleep apnea)    C-Pap  . Pacemaker Medtronic   . Sinoatrial node dysfunction (HCC)    syncope    Past Surgical History:  Procedure Laterality Date  . ABDOMINAL HYSTERECTOMY  2000   total  . CARDIAC CATHETERIZATION N/A 07/20/2016   Procedure: Left Heart Cath and  Coronary Angiography;  Surgeon: Peter M Martinique, MD;  Location: [place] [facility];  Service: Cardiovascular;  Laterality: N/A;  . CORONARY ARTERY BYPASS GRAFT N/A 07/22/2016   Procedure: CORONARY ARTERY BYPASS GRAFTING (CABG) x three , using left internal mammary artery and right leg greater saphenous vein harvested endoscopically;  Surgeon: Ivin Poot, MD;  Location: [place];  Service: Open Heart Surgery;  Laterality: N/A;  . EYE SURGERY Right 2010  . OOPHORECTOMY    . PACEMAKER PLACEMENT    . T/A right knee    . TEE WITHOUT CARDIOVERSION N/A 07/22/2016   Procedure: TRANSESOPHAGEAL ECHOCARDIOGRAM (TEE);  Surgeon: Ivin Poot, MD;  Location: [facility];  Service: Open Heart Surgery;  Laterality: N/A;  . TONSILLECTOMY      Current Outpatient Prescriptions  Medication Sig Dispense Refill  . albuterol (PROVENTIL HFA;VENTOLIN HFA) 108 (90 Base) MCG/ACT inhaler Inhale into the lungs every 6 (six) hours as needed for wheezing or shortness of breath.    Marland Kitchen aspirin EC 325 MG EC tablet Take 1 tablet (325 mg total) by mouth daily. 30 tablet 0  . atorvastatin (LIPITOR) 80 MG tablet Take 1 tablet (80 mg total) by mouth daily at 6 PM. 30 tablet 1  . cetirizine (ZYRTEC) 10 MG tablet Take 10 mg by mouth daily.      . Cholecalciferol (VITAMIN D) 2000 UNITS CAPS  Take 1 capsule by mouth daily as needed (vitamin supplement).     . citalopram (CELEXA) 20 MG tablet Take 1 tablet by mouth daily    . dicyclomine (BENTYL) 20 MG tablet Take 20 mg by mouth 3 (three) times daily.    . fish oil-omega-3 fatty acids 1000 MG capsule Take 2 g by mouth daily.     . fluticasone (FLONASE) 50 MCG/ACT nasal spray Place 2 sprays into both nostrils daily. 48 g 3  . fluticasone (FLOVENT HFA) 110 MCG/ACT inhaler Inhale 2 puffs into the lungs 2 (two) times daily. 1 Inhaler 12  . furosemide (LASIX) 40 MG tablet Take 1 tablet (40 mg total) by mouth daily. For 2 weeks then stop. 14 tablet 0  . gabapentin (NEURONTIN) 300 MG capsule  Take 1 tablet by mouth in the AM and 2 tablets by mouth in the PM    . glimepiride (AMARYL) 4 MG tablet Take 0.5 tablet by mouth twice daily with meals    . glucose blood test strip Use as instructed 200 each 5  . hydrOXYzine (ATARAX/VISTARIL) 25 MG tablet Take 25 mg by mouth every 8 (eight) hours as needed for anxiety.    . iron polysaccharides (NIFEREX) 150 MG capsule Take 1 capsule (150 mg total) by mouth daily. For one month then stop. 30 capsule 0  . lisinopril (PRINIVIL,ZESTRIL) 5 MG tablet Take 1 tablet (5 mg total) by mouth daily. 30 tablet 1  . metoprolol succinate (TOPROL-XL) 50 MG 24 hr tablet Take 1 tablet (50 mg total) by mouth daily. Take with or immediately following a meal. 30 tablet 1  . [START ON 08/30/2016] Multiple Vitamin (MULTIVITAMIN) tablet Take 1 tablet by mouth daily.    Marland Kitchen omeprazole (PRILOSEC) 20 MG capsule Take 1 capsule by mouth daily    . ondansetron (ZOFRAN-ODT) 8 MG disintegrating tablet DISSOLVE 1 ON TONGUE EVERY 12 HOURS AS NEEDED FOR NAUSEA 30 tablet 3  . potassium chloride SA (K-DUR,KLOR-CON) 20 MEQ tablet Take 1 tablet (20 mEq total) by mouth daily. For 2 weeks then stop. 14 tablet 0  . traMADol (ULTRAM) 50 MG tablet Take one or two by mouth every 4-6 hours PRN moderate pain 10 tablet 0  . triamcinolone cream (KENALOG) 0.1 % Apply 1 application topically as needed. 30 g 2   No current facility-administered medications for this visit.     Allergies  Allergen Reactions  . Adhesive [Tape] Other (See Comments)    SKIN BLISTERS  . Penicillins     Swelling   . Red Yeast Rice Hives  . Sulfa Drugs Cross Reactors Hives    Review of Systems negative except from HPI and PMH  Physical Exam BP 108/60   Pulse 78   Ht 5\' 2"  (1.575 m)   Wt 235 lb 6.4 oz (106.8 kg)   SpO2 95%   BMI 43.06 kg/m  Well developed and well nourished in no acute distress HENT normal E scleral and icterus clear Neck Supple JVP flat; carotids brisk and full Clear to  ausculation There are 2 incisions about 2 cm in diameter from which her transcutaneous pacing wires were extracted. They are healing slowly Device pocket well healed; without hematoma or erythema.  There is no tethering  Regular rate and rhythm, no murmurs gallops or rub Soft with active bowel sounds No clubbing cyanosis none Edema Alert and oriented, grossly normal motor and sensory function Skin Warm and Dry  ECG  75 Intervals 18/10/44 Otherwise normal Assessment and  Plan  Hypertension  HFpEF  Syncope  OSA  Atrial fibrillation-paroxysmal  Ischemic heart disease status post CABG 3  Pacer site wounds  Pacemaker-Medtronic  The patient's device was interrogated.  The information was reviewed. No changes were made in the programming.     Sinus node dysfunction Euvolemic continue current meds  BP well control  Sleep apnea on CPAP and card read recently  No interval atrial fibrillation. We will discontinue her amiodarone  With the pacemaker site wounds, I have called the [facility]. She will be seen there later this week.  No interval syncope.

## 2016-08-16 LAB — CUP PACEART INCLINIC DEVICE CHECK
Battery Impedance: 443 Ohm
Battery Voltage: 2.78 V
Brady Statistic AP VS Percent: 0 %
Brady Statistic AS VP Percent: 0 %
Date Time Interrogation Session: 20171030175544
Implantable Lead Implant Date: 20091201
Implantable Lead Location: 753860
Implantable Lead Model: 5076
Lead Channel Impedance Value: 386 Ohm
Lead Channel Pacing Threshold Amplitude: 0.625 V
Lead Channel Pacing Threshold Pulse Width: 0.4 ms
Lead Channel Setting Pacing Amplitude: 2.5 V
MDC IDC LEAD IMPLANT DT: 20091201
MDC IDC LEAD LOCATION: 753859
MDC IDC MSMT BATTERY REMAINING LONGEVITY: 110 mo
MDC IDC MSMT LEADCHNL RA SENSING INTR AMPL: 1 mV
MDC IDC MSMT LEADCHNL RV IMPEDANCE VALUE: 523 Ohm
MDC IDC MSMT LEADCHNL RV PACING THRESHOLD AMPLITUDE: 0.625 V
MDC IDC MSMT LEADCHNL RV PACING THRESHOLD PULSEWIDTH: 0.4 ms
MDC IDC PG IMPLANT DT: 20091201
MDC IDC SET LEADCHNL RA PACING AMPLITUDE: 2 V
MDC IDC SET LEADCHNL RV PACING PULSEWIDTH: 0.4 ms
MDC IDC SET LEADCHNL RV SENSING SENSITIVITY: 2.8 mV
MDC IDC STAT BRADY AP VP PERCENT: 0 %
MDC IDC STAT BRADY AS VS PERCENT: 100 %

## 2016-08-31 ENCOUNTER — Other Ambulatory Visit: Payer: Self-pay | Admitting: Cardiothoracic Surgery

## 2016-08-31 DIAGNOSIS — Z951 Presence of aortocoronary bypass graft: Secondary | ICD-10-CM

## 2016-09-01 ENCOUNTER — Ambulatory Visit (INDEPENDENT_AMBULATORY_CARE_PROVIDER_SITE_OTHER): Payer: Self-pay | Admitting: Cardiothoracic Surgery

## 2016-09-01 ENCOUNTER — Ambulatory Visit
Admission: RE | Admit: 2016-09-01 | Discharge: 2016-09-01 | Disposition: A | Discharge status: Home / Self Care | Payer: BLUE CROSS/BLUE SHIELD | Source: Ambulatory Visit | Attending: Cardiothoracic Surgery | Admitting: Cardiothoracic Surgery

## 2016-09-01 VITALS — BP 139/86 | HR 68 | Resp 20 | Ht 62.0 in | Wt 235.0 lb

## 2016-09-01 DIAGNOSIS — Z951 Presence of aortocoronary bypass graft: Secondary | ICD-10-CM

## 2016-09-01 DIAGNOSIS — Z736 Limitation of activities due to disability: Secondary | ICD-10-CM

## 2016-09-01 NOTE — Progress Notes (Signed)
PCP is Pcp Not In System Referring Provider is Deboraha Sprang, MD  Chief Complaint  Patient presents with  . Routine Post Op    3 week f/u with CXR     HPI: 1 month follow-up after urgent 3 vessel CABG Patient doing well without recurrent symptoms of angina Chest incision well-healed Right leg incision with cellulitis, drainage. Wound debrided and patient started on oral Keflex Chest x-ray is clear Patient is ready to drive and to increase activity level, lifting limited now to 15 pounds  Past Medical History:  Diagnosis Date  . Allergy   . Anal fissure   . Anxiety   . Arthritis   . Asthma   . Cataract 2010    left  . Colon polyps    diverticulosis  03-2011/2005  . Depression   . Diabetes mellitus 2012   T2DM  . Diverticulosis   . Fatty liver 04/2011  . GERD (gastroesophageal reflux disease)   . Hyperlipidemia   . Hypertension severe   . Migraine   . Obesity   . OSA (obstructive sleep apnea)    C-Pap  . Pacemaker Medtronic   . Sinoatrial node dysfunction (HCC)    syncope    Past Surgical History:  Procedure Laterality Date  . ABDOMINAL HYSTERECTOMY  2000   total  . CARDIAC CATHETERIZATION N/A 07/20/2016   Procedure: Left Heart Cath and Coronary Angiography;  Surgeon: Rizwan Kuyper M Martinique, MD;  Location: [place] [facility];  Service: Cardiovascular;  Laterality: N/A;  . CORONARY ARTERY BYPASS GRAFT N/A 07/22/2016   Procedure: CORONARY ARTERY BYPASS GRAFTING (CABG) x three , using left internal mammary artery and right leg greater saphenous vein harvested endoscopically;  Surgeon: Ivin Poot, MD;  Location: [place];  Service: Open Heart Surgery;  Laterality: N/A;  . EYE SURGERY Right 2010  . OOPHORECTOMY    . PACEMAKER PLACEMENT    . T/A right knee    . TEE WITHOUT CARDIOVERSION N/A 07/22/2016   Procedure: TRANSESOPHAGEAL ECHOCARDIOGRAM (TEE);  Surgeon: Ivin Poot, MD;  Location: [place];  Service: Open Heart Surgery;  Laterality: N/A;  . TONSILLECTOMY       Family History  Problem Relation Age of Onset  . Heart disease Mother   . Cancer Mother 102    lung  . Breast cancer Mother     maternal grandmother  . Diabetes Sister     type 2  . Breast cancer Brother   . Cancer Maternal Grandmother     breast  . Leukemia Paternal Grandmother     neoplast  . Aneurysm Paternal Grandfather     abdominal (stomach)  . Heart disease Father   . Colon cancer Neg Hx     Social History Social History  Substance Use Topics  . Smoking status: Former Smoker    Types: Cigarettes    Quit date: 10/12/1991  . Smokeless tobacco: Never Used     Comment: Quit 20 yrs ago  . Alcohol use No    Current Outpatient Prescriptions  Medication Sig Dispense Refill  . albuterol (PROVENTIL HFA;VENTOLIN HFA) 108 (90 Base) MCG/ACT inhaler Inhale into the lungs every 6 (six) hours as needed for wheezing or shortness of breath.    Marland Kitchen aspirin EC 325 MG EC tablet Take 1 tablet (325 mg total) by mouth daily. 30 tablet 0  . atorvastatin (LIPITOR) 80 MG tablet Take 1 tablet (80 mg total) by mouth daily at 6 PM. 30 tablet 1  . cetirizine (  ZYRTEC) 10 MG tablet Take 10 mg by mouth daily.      . Cholecalciferol (VITAMIN D) 2000 UNITS CAPS Take 1 capsule by mouth daily as needed (vitamin supplement).     . citalopram (CELEXA) 20 MG tablet Take 1 tablet by mouth daily    . dicyclomine (BENTYL) 20 MG tablet Take 20 mg by mouth 3 (three) times daily.    . fish oil-omega-3 fatty acids 1000 MG capsule Take 2 g by mouth daily.     . fluticasone (FLONASE) 50 MCG/ACT nasal spray Place 2 sprays into both nostrils daily. 48 g 3  . fluticasone (FLOVENT HFA) 110 MCG/ACT inhaler Inhale 2 puffs into the lungs 2 (two) times daily. 1 Inhaler 12  . gabapentin (NEURONTIN) 300 MG capsule Take 1 tablet by mouth in the AM and 2 tablets by mouth in the PM    . glimepiride (AMARYL) 4 MG tablet Take 0.5 tablet by mouth twice daily with meals    . glucose blood test strip Use as instructed 200 each 5   . hydrOXYzine (ATARAX/VISTARIL) 25 MG tablet Take 25 mg by mouth every 8 (eight) hours as needed for anxiety.    Marland Kitchen lisinopril (PRINIVIL,ZESTRIL) 5 MG tablet Take 1 tablet (5 mg total) by mouth daily. 30 tablet 1  . metoprolol succinate (TOPROL-XL) 50 MG 24 hr tablet Take 1 tablet (50 mg total) by mouth daily. Take with or immediately following a meal. 30 tablet 1  . Multiple Vitamin (MULTIVITAMIN) tablet Take 1 tablet by mouth daily.    Marland Kitchen omeprazole (PRILOSEC) 20 MG capsule Take 1 capsule by mouth daily    . ondansetron (ZOFRAN-ODT) 8 MG disintegrating tablet DISSOLVE 1 ON TONGUE EVERY 12 HOURS AS NEEDED FOR NAUSEA 30 tablet 3  . traMADol (ULTRAM) 50 MG tablet Take one or two by mouth every 4-6 hours PRN moderate pain 10 tablet 0  . triamcinolone cream (KENALOG) 0.1 % Apply 1 application topically as needed. 30 g 2   No current facility-administered medications for this visit.     Allergies  Allergen Reactions  . Adhesive [Tape] Other (See Comments)    SKIN BLISTERS  . Penicillins     Swelling   . Red Yeast Rice Hives  . Sulfa Drugs Cross Reactors Hives    Review of Systems  Feels much better overall No fever No ankle edema No shortness of breath  BP 139/86   Pulse 68   Resp 20   Ht 5\' 2"  (1.575 m)   Wt 235 lb (106.6 kg)   SpO2 93% Comment: RA  BMI 42.98 kg/m  Physical Exam      Exam    General- alert and comfortable   Lungs- clear without rales, wheezes   Cor- regular rate and rhythm, no murmur , gallop   Abdomen- soft, non-tender   Extremities - warm, non-tender, minimal edema   Neuro- oriented, appropriate, no focal weakness   Diagnostic Tests: Chest x-ray clear  Impression:  Excellent early recovery after urgent CABG Plan: Return to [facility] clinic on December 4 2 check right leg wound Return in mid January to review progress and to discuss return to work Len Childs, MD [facility] [phone]

## 2016-09-13 ENCOUNTER — Ambulatory Visit (INDEPENDENT_AMBULATORY_CARE_PROVIDER_SITE_OTHER): Payer: BLUE CROSS/BLUE SHIELD | Admitting: Surgical

## 2016-09-13 VITALS — BP 130/78 | HR 59 | Resp 16 | Ht 62.0 in | Wt 235.0 lb

## 2016-09-13 DIAGNOSIS — L03119 Cellulitis of unspecified part of limb: Secondary | ICD-10-CM

## 2016-09-13 DIAGNOSIS — Z951 Presence of aortocoronary bypass graft: Secondary | ICD-10-CM

## 2016-09-13 NOTE — Patient Instructions (Signed)
Finish antibiotics

## 2016-09-13 NOTE — Progress Notes (Signed)
[address]       [place] [address]             [phone]                  Pranathi Lap[facility] Record [id number] Date of Birth: 1956/04/30  Referring [id number], Revonda Standard, MD Primary Cardiology: Primary Care:Pcp Not In System  Chief Complaint:  Follow Up Visit  DATE OF PROCEDURE:  07/22/2016 DATE OF DISCHARGE:                              OPERATIVE REPORT   OPERATION: 1. Coronary artery bypass grafting x3 (left internal mammary artery to     LAD, saphenous vein graft to diagonal, saphenous vein graft to     obtuse marginal). 2. Endoscopic harvest of right leg greater saphenous vein.  SURGEON:  Ivin Poot, M.D.  ASSISTANT:  John Giovanni, P.A.-C. and Nicholes Rough, PA-C.  ANESTHESIA:  General by Dr. Gifford Shave.  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  History of Present Illness:    The patient is seen in routine office follow-up for right lower extremity EVH site cellulitis. She is status post a above described procedure. She is overall feeling significantly improved. The wound itself is healing nicely without any discharge or worsening erythema.    Zubrod Score: At the time of surgery this patient's most appropriate activity status/level should be described as: []     0    Normal activity, no symptoms []     1    Restricted in physical strenuous activity but ambulatory, able to do out light work []     2    Ambulatory and capable of self care, unable to do work activities, up and about                 >50 % of waking hours                                                                                   []     3    Only limited self care, in bed greater than 50% of waking hours []     4    Completely disabled, no self care, confined to bed or chair []     5     Moribund  History  Smoking Status  . Former Smoker  . Types: Cigarettes  . Quit date: 10/12/1991  Smokeless Tobacco  . Never Used    Comment: Quit 20 yrs ago       Allergies  Allergen Reactions  . Adhesive [Tape] Other (See Comments)    SKIN BLISTERS  . Penicillins     Swelling   . Red Yeast Rice Hives  . Sulfa Drugs Cross Reactors Hives    Current Outpatient Prescriptions  Medication Sig Dispense Refill  . albuterol (PROVENTIL HFA;VENTOLIN HFA) 108 (90 Base) MCG/ACT inhaler Inhale into the lungs every 6 (six) hours as needed for wheezing or shortness of breath.    Marland Kitchen  aspirin EC 325 MG EC tablet Take 1 tablet (325 mg total) by mouth daily. 30 tablet 0  . atorvastatin (LIPITOR) 80 MG tablet Take 1 tablet (80 mg total) by mouth daily at 6 PM. 30 tablet 1  . cetirizine (ZYRTEC) 10 MG tablet Take 10 mg by mouth daily.      . Cholecalciferol (VITAMIN D) 2000 UNITS CAPS Take 1 capsule by mouth daily as needed (vitamin supplement).     . citalopram (CELEXA) 20 MG tablet Take 1 tablet by mouth daily    . dicyclomine (BENTYL) 20 MG tablet Take 20 mg by mouth 3 (three) times daily.    . fish oil-omega-3 fatty acids 1000 MG capsule Take 2 g by mouth daily.     . fluticasone (FLONASE) 50 MCG/ACT nasal spray Place 2 sprays into both nostrils daily. 48 g 3  . fluticasone (FLOVENT HFA) 110 MCG/ACT inhaler Inhale 2 puffs into the lungs 2 (two) times daily. 1 Inhaler 12  . gabapentin (NEURONTIN) 300 MG capsule Take 1 tablet by mouth in the AM and 2 tablets by mouth in the PM    . glimepiride (AMARYL) 4 MG tablet Take 0.5 tablet by mouth twice daily with meals    . glucose blood test strip Use as instructed 200 each 5  . hydrOXYzine (ATARAX/VISTARIL) 25 MG tablet Take 25 mg by mouth every 8 (eight) hours as needed for anxiety.    Marland Kitchen lisinopril (PRINIVIL,ZESTRIL) 5 MG tablet Take 1 tablet (5 mg total) by mouth daily. 30 tablet 1  . metoprolol succinate (TOPROL-XL) 50 MG 24 hr tablet Take 1 tablet  (50 mg total) by mouth daily. Take with or immediately following a meal. 30 tablet 1  . Multiple Vitamin (MULTIVITAMIN) tablet Take 1 tablet by mouth daily.    Marland Kitchen omeprazole (PRILOSEC) 20 MG capsule Take 1 capsule by mouth daily    . ondansetron (ZOFRAN-ODT) 8 MG disintegrating tablet DISSOLVE 1 ON TONGUE EVERY 12 HOURS AS NEEDED FOR NAUSEA 30 tablet 3  . traMADol (ULTRAM) 50 MG tablet Take one or two by mouth every 4-6 hours PRN moderate pain 10 tablet 0  . triamcinolone cream (KENALOG) 0.1 % Apply 1 application topically as needed. 30 g 2   No current facility-administered medications for this visit.        Physical Exam: BP 130/78 (BP Location: Right Arm, Patient Position: Sitting, Cuff Size: Large)   Pulse (!) 59   Resp 16   Ht 5\' 2"  (1.575 m)   Wt 235 lb (106.6 kg)   SpO2 93% Comment: ON RA  BMI 42.98 kg/m   General appearance: alert, cooperative and no distress Heart: regular rate and rhythm Lungs: clear to auscultation bilaterally Wounds: Healing well without evidence of infection.  Diagnostic Studies & Laboratory data:         Recent Radiology Findings: No results found.    I have independently reviewed the above radiology findings and reviewed findings  with the patient.  Recent Labs: Lab Results  Component Value Date   WBC 11.6 (H) 07/26/2016   HGB 9.1 (L) 07/26/2016   HCT 27.7 (L) 07/26/2016   PLT 229 07/26/2016   GLUCOSE 114 (H) 07/26/2016   CHOL 254 (H) 07/21/2016   TRIG 255 (H) 07/21/2016   HDL 39 (L) 07/21/2016   LDLDIRECT 195 (H) 04/18/2013   LDLCALC 164 (H) 07/21/2016   ALT 28 07/21/2016   AST 68 (H) 07/21/2016   NA 135 07/26/2016   K 4.4 07/26/2016  CL 97 (L) 07/26/2016   CREATININE 0.87 07/26/2016   BUN 28 (H) 07/26/2016   CO2 31 07/26/2016   TSH 1.251 09/26/2012   INR 1.33 07/22/2016   HGBA1C 7.1 (H) 07/20/2016      Assessment / Plan:  Cellulitis has significantly improved. She is to finish her current antibiotics. She has a follow-up  appointment with Dr. Prescott Gum in January.       GOLD,WAYNE E 09/13/2016 2:02 PM

## 2016-09-15 ENCOUNTER — Ambulatory Visit (INDEPENDENT_AMBULATORY_CARE_PROVIDER_SITE_OTHER): Payer: BLUE CROSS/BLUE SHIELD | Admitting: Physician Assistant

## 2016-09-15 ENCOUNTER — Encounter: Payer: Self-pay | Admitting: Physician Assistant

## 2016-09-15 VITALS — BP 132/86 | HR 70 | Temp 98.2°F | Resp 18 | Ht 62.0 in | Wt 233.4 lb

## 2016-09-15 DIAGNOSIS — R109 Unspecified abdominal pain: Secondary | ICD-10-CM

## 2016-09-15 DIAGNOSIS — Z23 Encounter for immunization: Secondary | ICD-10-CM | POA: Diagnosis not present

## 2016-09-15 LAB — POCT CBC
GRANULOCYTE PERCENT: 62.4 % (ref 37–80)
HCT, POC: 36.8 % — AB (ref 37.7–47.9)
HEMOGLOBIN: 12.9 g/dL (ref 12.2–16.2)
Lymph, poc: 1.3 (ref 0.6–3.4)
MCH: 30.8 pg (ref 27–31.2)
MCHC: 34.9 g/dL (ref 31.8–35.4)
MCV: 88.1 fL (ref 80–97)
MID (CBC): 0.4 (ref 0–0.9)
MPV: 6.5 fL (ref 0–99.8)
PLATELET COUNT, POC: 257 10*3/uL (ref 142–424)
POC Granulocyte: 2.7 (ref 2–6.9)
POC LYMPH PERCENT: 29.6 %L (ref 10–50)
POC MID %: 8 %M (ref 0–12)
RBC: 4.18 M/uL (ref 4.04–5.48)
RDW, POC: 13.7 %
WBC: 4.4 10*3/uL — AB (ref 4.6–10.2)

## 2016-09-15 NOTE — Progress Notes (Signed)
[facility] [address], [place] [phone]  Date:  09/15/2016   Name:  [name] [name]   DOB:  26-May-1956   MRN:  [id number]  PCP:  Pcp Not In System    History of Present Illness:  [name] [name] is a 60 y.o. female patient who presents to [facility] for cc of abdominal pain.   Recent stemi status post cabg 2 months ago. Patient reports feeling a tear at her right side 3 days ago.  Abdominal pain that is intermittent at the right side.  Pain rated about 8-10/10.  Aggravated with sitting and bending forward.  Relief with lying down supine or standing still.  She denies fever.  Not associated with food.  No hematuria, dysuria, or frequency.  No dizziness.  Recent cabg.  Pain is adjacent to an surgical incisions.  No drainage.  No change in color to adjacent area.   She advised her cardiology team of the discomfort 2 days ago.     Patient Active Problem List   Diagnosis Date Noted  . S/P CABG x 3 07/22/2016  . Multiple vessel coronary artery disease 07/21/2016  . STEMI involving left circumflex coronary artery ([place]) 07/20/2016  . Asthmatic bronchitis 04/26/2016  . Allergic urticaria 04/26/2016  . Environmental allergies 04/26/2016  . Neck pain 04/26/2016  . Anxiety 04/26/2016  . Sinoatrial node dysfunction (HCC) 04/26/2016  . Pacemaker Medtronic   . Ventricular tachycardia, nonsustained-during exercise testing 03/07/2013  . OSA (obstructive sleep apnea) 05/25/2011  . Hx of adenomatous colonic polyps 05/25/2011  . Migraine 05/25/2011  . Diabetes mellitus type 2, uncontrolled, with complications ([place]) 38/93/7342  . GERD (gastroesophageal reflux disease) 05/25/2011  . NAFLD (nonalcoholic fatty liver disease) 05/25/2011  . Hyperlipidemia 05/25/2011  . Severe essential hypertension 09/16/2009  . DIASTOLIC HEART FAILURE, CHRONIC 09/16/2009    Past Medical History:  Diagnosis Date  . Allergy   . Anal fissure   . Anxiety   . Arthritis   . Asthma   .  Cataract 2010    left  . Colon polyps    diverticulosis  03-2011/2005  . Depression   . Diabetes mellitus 2012   T2DM  . Diverticulosis   . Fatty liver 04/2011  . GERD (gastroesophageal reflux disease)   . Hyperlipidemia   . Hypertension severe   . Migraine   . Obesity   . OSA (obstructive sleep apnea)    C-Pap  . Pacemaker Medtronic   . Sinoatrial node dysfunction (HCC)    syncope    Past Surgical History:  Procedure Laterality Date  . ABDOMINAL HYSTERECTOMY  2000   total  . CARDIAC CATHETERIZATION N/A 07/20/2016   Procedure: Left Heart Cath and Coronary Angiography;  Surgeon: Peter M Martinique, MD;  Location: [facility];  Service: Cardiovascular;  Laterality: N/A;  . CORONARY ARTERY BYPASS GRAFT N/A 07/22/2016   Procedure: CORONARY ARTERY BYPASS GRAFTING (CABG) x three , using left internal mammary artery and right leg greater saphenous vein harvested endoscopically;  Surgeon: Ivin Poot, MD;  Location: [facility];  Service: Open Heart Surgery;  Laterality: N/A;  . EYE SURGERY Right 2010  . OOPHORECTOMY    . PACEMAKER PLACEMENT    . T/A right knee    . TEE WITHOUT CARDIOVERSION N/A 07/22/2016   Procedure: TRANSESOPHAGEAL ECHOCARDIOGRAM (TEE);  Surgeon: Ivin Poot, MD;  Location: [facility];  Service: Open Heart Surgery;  Laterality: N/A;  . TONSILLECTOMY  Social History  Substance Use Topics  . Smoking status: Former Smoker    Types: Cigarettes    Quit date: 10/12/1991  . Smokeless tobacco: Never Used     Comment: Quit 20 yrs ago  . Alcohol use No    Family History  Problem Relation Age of Onset  . Heart disease Mother   . Cancer Mother 42    lung  . Breast cancer Mother     maternal grandmother  . Diabetes Sister     type 2  . Breast cancer Brother   . Cancer Maternal Grandmother     breast  . Leukemia Paternal Grandmother     neoplast  . Aneurysm Paternal Grandfather     abdominal (stomach)  . Heart disease Father   . Colon cancer Neg Hx      Allergies  Allergen Reactions  . Adhesive [Tape] Other (See Comments)    SKIN BLISTERS  . Penicillins     Swelling   . Red Yeast Rice Hives  . Sulfa Drugs Cross Reactors Hives    Medication list has been reviewed and updated.  Current Outpatient Prescriptions on File Prior to Visit  Medication Sig Dispense Refill  . albuterol (PROVENTIL HFA;VENTOLIN HFA) 108 (90 Base) MCG/ACT inhaler Inhale into the lungs every 6 (six) hours as needed for wheezing or shortness of breath.    Marland Kitchen aspirin EC 325 MG EC tablet Take 1 tablet (325 mg total) by mouth daily. 30 tablet 0  . atorvastatin (LIPITOR) 80 MG tablet Take 1 tablet (80 mg total) by mouth daily at 6 PM. 30 tablet 1  . cetirizine (ZYRTEC) 10 MG tablet Take 10 mg by mouth daily.      . Cholecalciferol (VITAMIN D) 2000 UNITS CAPS Take 1 capsule by mouth daily as needed (vitamin supplement).     . citalopram (CELEXA) 20 MG tablet Take 1 tablet by mouth daily    . dicyclomine (BENTYL) 20 MG tablet Take 20 mg by mouth 3 (three) times daily.    . fish oil-omega-3 fatty acids 1000 MG capsule Take 2 g by mouth daily.     . fluticasone (FLONASE) 50 MCG/ACT nasal spray Place 2 sprays into both nostrils daily. 48 g 3  . fluticasone (FLOVENT HFA) 110 MCG/ACT inhaler Inhale 2 puffs into the lungs 2 (two) times daily. 1 Inhaler 12  . gabapentin (NEURONTIN) 300 MG capsule Take 1 tablet by mouth in the AM and 2 tablets by mouth in the PM    . glimepiride (AMARYL) 4 MG tablet Take 0.5 tablet by mouth twice daily with meals    . glucose blood test strip Use as instructed 200 each 5  . hydrOXYzine (ATARAX/VISTARIL) 25 MG tablet Take 25 mg by mouth every 8 (eight) hours as needed for anxiety.    Marland Kitchen lisinopril (PRINIVIL,ZESTRIL) 5 MG tablet Take 1 tablet (5 mg total) by mouth daily. 30 tablet 1  . metoprolol succinate (TOPROL-XL) 50 MG 24 hr tablet Take 1 tablet (50 mg total) by mouth daily. Take with or immediately following a meal. 30 tablet 1  . Multiple  Vitamin (MULTIVITAMIN) tablet Take 1 tablet by mouth daily.    Marland Kitchen omeprazole (PRILOSEC) 20 MG capsule Take 1 capsule by mouth daily    . ondansetron (ZOFRAN-ODT) 8 MG disintegrating tablet DISSOLVE 1 ON TONGUE EVERY 12 HOURS AS NEEDED FOR NAUSEA 30 tablet 3  . traMADol (ULTRAM) 50 MG tablet Take one or two by mouth every 4-6 hours PRN moderate  pain 10 tablet 0  . triamcinolone cream (KENALOG) 0.1 % Apply 1 application topically as needed. 30 g 2   No current facility-administered medications on file prior to visit.     ROS ROS otherwise unremarkable unless listed above.   Physical Examination: BP 132/86 (BP Location: Left Arm, Patient Position: Sitting, Cuff Size: Large)   Pulse 70   Temp 98.2 F (36.8 C) (Oral)   Resp 18   Ht _0  (1.575 m)   Wt 233 lb 6.4 oz (105.9 kg)   SpO2 94%   BMI 42.69 kg/m  Ideal Body Weight: Weight in (lb) to have BMI = 25: 136.4  Physical Exam  Constitutional: She is oriented to person, place, and time. She appears well-developed and well-nourished. No distress.  HENT:  Head: Normocephalic and atraumatic.  Right Ear: External ear normal.  Left Ear: External ear normal.  Eyes: Conjunctivae and EOM are normal. Pupils are equal, round, and reactive to light.  Cardiovascular: Normal rate and regular rhythm.  Exam reveals no friction rub.   No murmur heard. Pulmonary/Chest: Effort normal. No respiratory distress. She has no wheezes.  Abdominal: Soft. Bowel sounds are normal. She exhibits no distension. There is no tenderness.    Minimally tender nodule not pronounced with flexing abdominal wall.  No erythema or ecchymosis.  Just at the lateral edge of the right incision site that is without erythema or drainage.   Neurological: She is alert and oriented to person, place, and time.  Skin: She is not diaphoretic.  Psychiatric: She has a normal mood and affect. Her behavior is normal.   Results for orders placed or performed in visit on 09/15/16  POCT  CBC  Result Value Ref Range   WBC 4.4 (A) 4.6 - 10.2 K/uL   Lymph, poc 1.3 0.6 - 3.4   POC LYMPH PERCENT 29.6 10 - 50 %L   MID (cbc) 0.4 0 - 0.9   POC MID % 8.0 0 - 12 %M   POC Granulocyte 2.7 2 - 6.9   Granulocyte percent 62.4 37 - 80 %G   RBC 4.18 4.04 - 5.48 M/uL   Hemoglobin 12.9 12.2 - 16.2 g/dL   HCT, POC 36.8 (A) 37.7 - 47.9 %   MCV 88.1 80 - 97 fL   MCH, POC 30.8 27 - 31.2 pg   MCHC 34.9 31.8 - 35.4 g/dL   RDW, POC 13.7 %   Platelet Count, POC 257 142 - 424 K/uL   MPV 6.5 0 - 99.8 fL    Assessment and Plan: [name] [name] is a 60 y.o. female who is here today for cc of abdominal pain.   Diff dx: scar tissue, fluid at the abdominal wall secondary to procedure, hernia, walled off abscess (unlikely). She will follow up with surgeon in 1 month.  In the meantime, she will place thermotherapy.  Advised to let me know if her symptoms worsen, or the area gets larger.  Alarming symptoms discussed.  I offered ultrasound today but she had declined.  This is fine.    Discussed briefly of her cpap machine which works.  She may return with form to complete.   Abdominal pain, unspecified abdominal location - Plan: POCT CBC, CMP14+EGFR  Need for prophylactic vaccination and inoculation against influenza - Plan: Flu Vaccine QUAD 36+ mos PF IM (Fluarix & Fluzone Quad PF)  Ivar Drape, PA-C [facility] 12/6/20175:54 PM

## 2016-09-15 NOTE — Patient Instructions (Signed)
     IF you received an x-ray today, you will receive an invoice from [facility]. Please contact [facility] at [phone] with questions or concerns regarding your invoice.   IF you received labwork today, you will receive an invoice from [facility]. Please contact [facility] at [phone] with questions or concerns regarding your invoice.   Our billing staff will not be able to assist you with questions regarding bills from these companies.  You will be contacted with the lab results as soon as they are available. The fastest way to get your results is to activate your My Chart account. Instructions are located on the last page of this paperwork. If you have not heard from us regarding the results in 2 weeks, please contact this office.      

## 2016-09-16 LAB — SPECIMEN STATUS

## 2016-09-17 LAB — CMP14+EGFR
ALBUMIN: 4.4 g/dL (ref 3.6–4.8)
ALT: 23 IU/L (ref 0–32)
AST: 28 IU/L (ref 0–40)
Albumin/Globulin Ratio: 1.5 (ref 1.2–2.2)
Alkaline Phosphatase: 107 IU/L (ref 39–117)
BILIRUBIN TOTAL: 0.3 mg/dL (ref 0.0–1.2)
BUN / CREAT RATIO: 17 (ref 12–28)
BUN: 16 mg/dL (ref 8–27)
CALCIUM: 9.8 mg/dL (ref 8.7–10.3)
CHLORIDE: 102 mmol/L (ref 96–106)
CO2: 25 mmol/L (ref 18–29)
Creatinine, Ser: 0.92 mg/dL (ref 0.57–1.00)
GFR, EST AFRICAN AMERICAN: 77 mL/min/{1.73_m2} (ref >59)
GFR, EST NON AFRICAN AMERICAN: 67 mL/min/{1.73_m2} (ref >59)
GLUCOSE: 118 mg/dL — AB (ref 65–99)
Globulin, Total: 2.9 g/dL (ref 1.5–4.5)
Potassium: 4.2 mmol/L (ref 3.5–5.2)
Sodium: 143 mmol/L (ref 134–144)
TOTAL PROTEIN: 7.3 g/dL (ref 6.0–8.5)

## 2016-09-23 ENCOUNTER — Encounter (HOSPITAL_COMMUNITY): Payer: Self-pay

## 2016-09-23 ENCOUNTER — Encounter (HOSPITAL_COMMUNITY)
Admission: RE | Admit: 2016-09-23 | Discharge: 2016-09-23 | Disposition: A | Discharge status: Home / Self Care | Payer: BLUE CROSS/BLUE SHIELD | Source: Ambulatory Visit | Attending: Cardiology | Admitting: Cardiology

## 2016-09-23 VITALS — BP 138/84 | HR 68 | Ht 63.25 in | Wt 237.2 lb

## 2016-09-23 DIAGNOSIS — I213 ST elevation (STEMI) myocardial infarction of unspecified site: Secondary | ICD-10-CM | POA: Diagnosis present

## 2016-09-23 DIAGNOSIS — I2121 ST elevation (STEMI) myocardial infarction involving left circumflex coronary artery: Secondary | ICD-10-CM | POA: Insufficient documentation

## 2016-09-23 DIAGNOSIS — Z951 Presence of aortocoronary bypass graft: Secondary | ICD-10-CM | POA: Diagnosis present

## 2016-09-23 NOTE — Progress Notes (Signed)
Cardiac Rehab Medication Review by a Pharmacist  Does the patient  feel that his/her medications are working for him/her?  yes  Has the patient been experiencing any side effects to the medications prescribed?  no  Does the patient measure his/her own blood pressure or blood glucose at home?  yes   Does the patient have any problems obtaining medications due to transportation or finances?   no  Understanding of regimen: good Understanding of indications: good Potential of compliance: good    Pharmacist comments: CC is a 60 yo female who presents to cardiac rehab without assistance. She has a good understanding of her medications and was aware of all of the changes to her regimen. She does check her blood pressure at least once a week at home.     Ihor Austin, PharmD PGY1 Pharmacy Resident Pager: [phone] 09/23/2016 9:02 AM

## 2016-09-23 NOTE — Progress Notes (Signed)
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  albuterol (PROVENTIL HFA;VENTOLIN HFA) 108 (90 Base) MCG/ACT inhaler, Inhale into the lungs every 6 (six) hours as needed for wheezing or shortness of breath., Disp: , Rfl:  .  aspirin EC 325 MG EC tablet, Take 1 tablet (325 mg total) by mouth daily., Disp: 30 tablet, Rfl: 0 .  atorvastatin (LIPITOR) 80 MG tablet, Take 1 tablet (80 mg total) by mouth daily at 6 PM., Disp: 30 tablet, Rfl: 1 .  cetirizine (ZYRTEC) 10 MG tablet, Take 10 mg by mouth daily.  , Disp: , Rfl:  .  Cholecalciferol (VITAMIN D) 2000 UNITS CAPS, Take 1 capsule by mouth daily as needed (vitamin supplement). , Disp: , Rfl:  .  citalopram (CELEXA) 20 MG tablet, Take 1 tablet by mouth daily, Disp: , Rfl:  .  dicyclomine (BENTYL) 20 MG tablet, Take 20 mg by mouth 3 (three) times daily., Disp: , Rfl:  .  fish oil-omega-3 fatty acids 1000 MG capsule, Take 2 g by mouth daily. , Disp: , Rfl:  .  fluticasone (FLONASE) 50 MCG/ACT nasal spray, Place 2 sprays into both nostrils daily., Disp: 48 g, Rfl: 3 .  fluticasone (FLOVENT HFA) 110 MCG/ACT inhaler, Inhale 2 puffs into the lungs 2 (two) times daily., Disp: 1 Inhaler, Rfl: 12 .  gabapentin (NEURONTIN) 300 MG capsule, Take 1 tablet by mouth in the AM and 2 tablets by mouth in the PM, Disp: , Rfl:  .   glimepiride (AMARYL) 4 MG tablet, Take 0.5 tablet by mouth twice daily with meals, Disp: , Rfl:  .  glucose blood test strip, Use as instructed, Disp: 200 each, Rfl: 5 .  hydrOXYzine (ATARAX/VISTARIL) 25 MG tablet, Take 25 mg by mouth every 8 (eight) hours as needed for anxiety., Disp: , Rfl:  .  lisinopril (PRINIVIL,ZESTRIL) 5 MG tablet, Take 1 tablet (5 mg total) by mouth daily., Disp: 30 tablet, Rfl: 1 .  metoprolol succinate (TOPROL-XL) 50 MG 24 hr tablet, Take 1 tablet (50 mg total) by mouth daily. Take with or immediately following a meal., Disp: 30 tablet, Rfl: 1 .  Multiple Vitamin (MULTIVITAMIN) tablet, Take 1 tablet by mouth daily., Disp: , Rfl:  .  omeprazole (PRILOSEC) 20 MG capsule, Take 1 capsule by mouth daily, Disp: , Rfl:  .  ondansetron (ZOFRAN-ODT) 8 MG disintegrating tablet, DISSOLVE 1 ON TONGUE EVERY 12 HOURS AS NEEDED FOR NAUSEA, Disp: 30 tablet, Rfl: 3 .  traMADol (ULTRAM) 50 MG tablet, Take one or two by mouth every 4-6 hours PRN moderate pain, Disp: 10 tablet, Rfl: 0 .  triamcinolone cream (KENALOG) 0.1 %, Apply 1 application topically as needed., Disp: 30 g, Rfl: 2  Past Medical History: Past Medical History:  Diagnosis Date  . Allergy   . Anal fissure   . Anxiety   . Arthritis   . Asthma   .  Cataract 2010    left  . Colon polyps    diverticulosis  03-2011/2005  . Depression   . Diabetes mellitus 2012   T2DM  . Diverticulosis   . Fatty liver 04/2011  . GERD (gastroesophageal reflux disease)   . Hyperlipidemia   . Hypertension severe   . Migraine   . Obesity   . OSA (obstructive sleep apnea)    C-Pap  . Pacemaker Medtronic   . Sinoatrial node dysfunction (HCC)    syncope    Tobacco Use: History  Smoking Status  . Former Smoker  . Types: Cigarettes  . Quit date: 10/12/1991  Smokeless Tobacco  . Never Used    Comment: Quit 20 yrs ago    Labs: Recent Review Flowsheet Data    Labs for ITP Cardiac and Pulmonary Rehab Latest Ref Rng & Units  07/23/2016 07/23/2016 07/23/2016 07/23/2016 07/23/2016   Cholestrol 0 - 200 mg/dL - - - - -   LDLCALC 0 - 99 mg/dL - - - - -   LDLDIRECT mg/dL - - - - -   HDL >40 mg/dL - - - - -   Trlycerides <150 mg/dL - - - - -   Hemoglobin A1c 4.8 - 5.6 % - - - - -   PHART 7.350 - 7.450 7.297(L) - 7.319(L) 7.331(L) -   PCO2ART 32.0 - 48.0 mmHg 48.6(H) - 46.1 45.9 -   HCO3 20.0 - 28.0 mmol/L 23.7 - 23.7 24.2 -   TCO2 0 - 100 mmol/L 25 23 25 26 26    ACIDBASEDEF 0.0 - 2.0 mmol/L 3.0(H) - 2.0 2.0 -   O2SAT % 88.0 - 88.0 87.0 -      Capillary Blood Glucose: Lab Results  Component Value Date   GLUCAP 63 (L) 07/27/2016   GLUCAP 158 (H) 07/27/2016   GLUCAP 153 (H) 07/27/2016   GLUCAP 64 (L) 07/27/2016   GLUCAP 99 07/26/2016     Exercise Target Goals: Date: 09/23/16  Exercise Program Goal: Individual exercise prescription set with THRR, safety & activity barriers. Participant demonstrates ability to understand and report RPE using BORG scale, to self-measure pulse accurately, and to acknowledge the importance of the exercise prescription.  Exercise Prescription Goal: Starting with aerobic activity 30 plus minutes a day, 3 days per week for initial exercise prescription. Provide home exercise prescription and guidelines that participant acknowledges understanding prior to discharge.  Activity Barriers & Risk Stratification:     Activity Barriers & Cardiac Risk Stratification - 09/23/16 0917      Activity Barriers & Cardiac Risk Stratification   Activity Barriers Right Knee Replacement   Cardiac Risk Stratification High      6 Minute Walk:     6 Minute Walk    Row Name 09/23/16 0957 09/23/16 1431       6 Minute Walk   Phase Initial  -    Distance 1508 feet  -    Walk Time 6 minutes  -    # of Rest Breaks 0  -    MPH  - 2.6    METS  - 2.9    RPE 11  -    VO2 Peak  - 10.3    Symptoms No  -    Resting HR 68 bpm  -    Resting BP 138/84  -    Max Ex. HR 83 bpm  -    Max Ex. BP  164/90  -    2 Minute Post BP  -  144/86       Initial Exercise Prescription:     Initial Exercise Prescription - 09/23/16 1400      Date of Initial Exercise RX and Referring Provider   Date 09/23/16   Referring Provider Martinique, Peter MD     Bike   Level 1   Minutes 10   METs 2.75     NuStep   Level 1   Minutes 10   METs 2     Track   Laps 10   Minutes 10   METs 2.74     Prescription Details   Frequency (times per week) 3   Duration Progress to 45 minutes of aerobic exercise without signs/symptoms of physical distress     Intensity   THRR 40-80% of Max Heartrate 63-126   Ratings of Perceived Exertion 11-15   Perceived Dyspnea 0-4     Progression   Progression Continue to progress workloads to maintain intensity without signs/symptoms of physical distress.     Resistance Training   Training Prescription Yes   Weight 2   Reps 10-12      Perform Capillary Blood Glucose checks as needed.  Exercise Prescription Changes:   Exercise Comments:   Discharge Exercise Prescription (Final Exercise Prescription Changes):   Nutrition:  Target Goals: Understanding of nutrition guidelines, daily intake of sodium 1500mg , cholesterol 200mg , calories 30% from fat and 7% or less from saturated fats, daily to have 5 or more servings of fruits and vegetables.  Biometrics:     Pre Biometrics - 09/23/16 1432      Pre Biometrics   % Body Fat 53.8 %       Nutrition Therapy Plan and Nutrition Goals:   Nutrition Discharge: Nutrition Scores:   Nutrition Goals Re-Evaluation:   Psychosocial: Target Goals: Acknowledge presence or absence of depression, maximize coping skills, provide positive support system. Participant is able to verbalize types and ability to use techniques and skills needed for reducing stress and depression.  Initial Review & Psychosocial Screening:     Initial Psych Review & Screening - 09/23/16 [place]? Yes  pt has a very strong support group with family and church staff and freinds.   Comments Brief assessment completed     Barriers   Psychosocial barriers to participate in program There are no identifiable barriers or psychosocial needs.     Screening Interventions   Interventions Encouraged to exercise      Quality of Life Scores:     Quality of Life - 09/23/16 1149      Quality of Life Scores   Health/Function Pre 26.04 %   Socioeconomic Pre 30 %   Psych/Spiritual Pre 24.86 %   Family Pre 24.17 %   GLOBAL Pre 26.48 %      PHQ-9: Recent Review Flowsheet Data    Depression screen [facility] 2/9 09/15/2016 07/20/2016 04/26/2016 03/06/2015 02/14/2015   Decreased Interest 0 0 0 0 0   Down, Depressed, Hopeless 0 0 0 0 0   PHQ - 2 Score 0 0 0 0 0      Psychosocial Evaluation and Intervention:   Psychosocial Re-Evaluation:   Vocational Rehabilitation: Provide vocational rehab assistance to qualifying candidates.   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      Education: Education Goals: Education classes will be provided on a weekly basis, covering required topics. Participant will state understanding/return demonstration of topics presented.  Learning Barriers/Preferences:     Learning Barriers/Preferences - 09/23/16 0917      Learning Barriers/Preferences   Learning Barriers Sight   Learning Preferences Written Material;Skilled Demonstration;Video;Pictoral      Education Topics: Count Your Pulse:  -Group instruction provided by verbal instruction, demonstration, patient participation and written materials to support subject.  Instructors address importance of being able to find your pulse and how to count  your pulse when at home without a heart monitor.  Patients get hands on experience counting their pulse with staff help and individually.   Heart Attack, Angina, and Risk Factor Modification:  -Group instruction provided by verbal instruction, video, and written materials to support subject.  Instructors address signs and symptoms of angina and heart attacks.    Also discuss risk factors for heart disease and how to make changes to improve heart health risk factors.   Functional Fitness:  -Group instruction provided by verbal instruction, demonstration, patient participation, and written materials to support subject.  Instructors address safety measures for doing things around the house.  Discuss how to get up and down off the floor, how to pick things up properly, how to safely get out of a chair without assistance, and balance training.   Meditation and Mindfulness:  -Group instruction provided by verbal instruction, patient participation, and written materials to support subject.  Instructor addresses importance of mindfulness and meditation practice to help reduce stress and improve awareness.  Instructor also leads participants through a meditation exercise.    Stretching for Flexibility and Mobility:  -Group instruction provided by verbal instruction, patient participation, and written materials to support subject.  Instructors lead participants through series of stretches that are designed to increase flexibility thus improving mobility.  These stretches are additional exercise for major muscle groups that are typically performed during regular warm up and cool down.   Hands Only CPR Anytime:  -Group instruction provided by verbal instruction, video, patient participation and written materials to support subject.  Instructors co-teach with AHA video for hands only CPR.  Participants get hands on experience with mannequins.   Nutrition I class: Heart Healthy Eating:  -Group instruction  provided by PowerPoint slides, verbal discussion, and written materials to support subject matter. The instructor gives an explanation and review of the Therapeutic Lifestyle Changes diet recommendations, which includes a discussion on lipid goals, dietary fat, sodium, fiber, plant stanol/sterol esters, sugar, and the components of a well-balanced, healthy diet.   Nutrition II class: Lifestyle Skills:  -Group instruction provided by PowerPoint slides, verbal discussion, and written materials to support subject matter. The instructor gives an explanation and review of label reading, grocery shopping for heart health, heart healthy recipe modifications, and ways to make healthier choices when eating out.   Diabetes Question & Answer:  -Group instruction provided by PowerPoint slides, verbal discussion, and written materials to support subject matter. The instructor gives an explanation and review of diabetes co-morbidities, pre- and post-prandial blood glucose goals, pre-exercise blood glucose goals, signs, symptoms, and treatment of hypoglycemia and hyperglycemia, and foot care basics.   Diabetes Blitz:  -Group instruction provided by PowerPoint slides, verbal discussion, and written materials to support subject matter. The instructor gives an explanation and review of the physiology behind type  1 and type 2 diabetes, diabetes medications and rational behind using different medications, pre- and post-prandial blood glucose recommendations and Hemoglobin A1c goals, diabetes diet, and exercise including blood glucose guidelines for exercising safely.    Portion Distortion:  -Group instruction provided by PowerPoint slides, verbal discussion, written materials, and food models to support subject matter. The instructor gives an explanation of serving size versus portion size, changes in portions sizes over the last 20 years, and what consists of a serving from each food group.   Stress Management:   -Group instruction provided by verbal instruction, video, and written materials to support subject matter.  Instructors review role of stress in heart disease and how to cope with stress positively.     Exercising on Your Own:  -Group instruction provided by verbal instruction, power point, and written materials to support subject.  Instructors discuss benefits of exercise, components of exercise, frequency and intensity of exercise, and end points for exercise.  Also discuss use of nitroglycerin and activating EMS.  Review options of places to exercise outside of rehab.  Review guidelines for sex with heart disease.   Cardiac Drugs I:  -Group instruction provided by verbal instruction and written materials to support subject.  Instructor reviews cardiac drug classes: antiplatelets, anticoagulants, beta blockers, and statins.  Instructor discusses reasons, side effects, and lifestyle considerations for each drug class.   Cardiac Drugs II:  -Group instruction provided by verbal instruction and written materials to support subject.  Instructor reviews cardiac drug classes: angiotensin converting enzyme inhibitors (ACE-I), angiotensin II receptor blockers (ARBs), nitrates, and calcium channel blockers.  Instructor discusses reasons, side effects, and lifestyle considerations for each drug class.   Anatomy and Physiology of the Circulatory System:  -Group instruction provided by verbal instruction, video, and written materials to support subject.  Reviews functional anatomy of heart, how it relates to various diagnoses, and what role the heart plays in the overall system.   Knowledge Questionnaire Score:     Knowledge Questionnaire Score - 09/23/16 0956      Knowledge Questionnaire Score   Pre Score 21/24      Core Components/Risk Factors/Patient Goals at Admission:     Personal Goals and Risk Factors at Admission - 09/23/16 1019      Core Components/Risk Factors/Patient Goals on  Admission   Personal Goal To be motivated to exercise and learn more about nutrition to help with diabetes management   Intervention Provide nutrition counseling and exercise programming to assist with making dietary changes and increasing exercise tolerance/aerobic fitness      Core Components/Risk Factors/Patient Goals Review:    Core Components/Risk Factors/Patient Goals at Discharge (Final Review):    ITP Comments:     ITP Comments    Row Name 09/23/16 0914           ITP Comments Dr. Fransico Him Medical Director          Comments:  Pt in today for phase II cardiac rehab orientation. As a part of the orientation session, pt completed 5 minutes of warm up stretches and 6 minute walk test.  Pt tolerated walk test with no complaints or sob.  O2 sat 93  unsure of the accuracy of the reading. Will recheck on Monday with exercise. Monitor shows SR with no noted ectopy.  Pt has a pacemaker, but per pt her PM is  rarely used for bradycardia. Brief Psychosocial assessment reveals no barriers to participating in rehab. Pt is looking forward to participating in  full exercise next week. Cherre Huger, BSN

## 2016-09-27 ENCOUNTER — Encounter (HOSPITAL_COMMUNITY)
Admission: RE | Admit: 2016-09-27 | Discharge: 2016-09-27 | Disposition: A | Discharge status: Home / Self Care | Payer: BLUE CROSS/BLUE SHIELD | Source: Ambulatory Visit | Attending: Cardiology | Admitting: Cardiology

## 2016-09-27 DIAGNOSIS — I213 ST elevation (STEMI) myocardial infarction of unspecified site: Secondary | ICD-10-CM

## 2016-09-27 DIAGNOSIS — Z951 Presence of aortocoronary bypass graft: Secondary | ICD-10-CM

## 2016-09-27 DIAGNOSIS — I2121 ST elevation (STEMI) myocardial infarction involving left circumflex coronary artery: Secondary | ICD-10-CM | POA: Diagnosis not present

## 2016-09-27 LAB — GLUCOSE, CAPILLARY
GLUCOSE-CAPILLARY: 220 mg/dL — AB (ref 65–99)
Glucose-Capillary: 206 mg/dL — ABNORMAL HIGH (ref 65–99)

## 2016-09-27 NOTE — Progress Notes (Signed)
Daily Session Note  Patient Details  Name: Japji Kok MRN: [id number] Date of Birth: [birth year] Referring Provider:   Flowsheet Row CARDIAC REHAB PHASE II ORIENTATION from 09/23/2016 in [place]  Referring Provider  Martinique, Peter MD      Encounter Date: 09/27/2016  Check In:     Session Check In - 09/27/16 1050      Check-In   Location [facility]   Staff Present Cleda Mccreedy, MS, Exercise Physiologist;Carlette Wilber Oliphant, RN, BSN;Amber Fair, MS, ACSM RCEP, Exercise Physiologist;Maria Whitaker, RN, BSN;Joann Rion, RN, BSN;Molly diVincenzo, MS, ACSM RCEP, Exercise Physiologist   Supervising physician immediately available to respond to emergencies [facility] immediately available   Physician(s) Dr. Carles Collet   Medication changes reported     No   Fall or balance concerns reported    No   Warm-up and Cool-down Performed as group-led instruction   Resistance Training Performed Yes   VAD Patient? No     Pain Assessment   Currently in Pain? No/denies      Capillary Blood Glucose: Results for orders placed or performed during the hospital encounter of 09/27/16 (from the past 24 hour(s))  Glucose, capillary     Status: Abnormal   Collection Time: 09/27/16  9:42 AM  Result Value Ref Range   Glucose-Capillary 206 (H) 65 - 99 mg/dL  Glucose, capillary     Status: Abnormal   Collection Time: 09/27/16 10:45 AM  Result Value Ref Range   Glucose-Capillary 220 (H) 65 - 99 mg/dL     Goals Met:  Exercise tolerated well  Goals Unmet:  Not Applicable  Comments: [place] started cardiac rehab today.  Pt tolerated light exercise without difficulty. VSS, telemetry-Sinus, asymptomatic.  Medication list reconciled. Pt denies barriers to medicaiton compliance.  PSYCHOSOCIAL ASSESSMENT:  PHQ-0. Pt exhibits positive coping skills, hopeful outlook with supportive family. No psychosocial needs identified at this time, no psychosocial  interventions necessary.    Pt enjoys playing with her grand niece and nephew.   Pt oriented to exercise equipment and routine.    Understanding verbalized.   Dr. Fransico Him is Medical Director for Cardiac Rehab at [facility]

## 2016-09-29 ENCOUNTER — Encounter (HOSPITAL_COMMUNITY)
Admission: RE | Admit: 2016-09-29 | Discharge: 2016-09-29 | Disposition: A | Discharge status: Home / Self Care | Payer: BLUE CROSS/BLUE SHIELD | Source: Ambulatory Visit | Attending: Cardiology | Admitting: Cardiology

## 2016-09-29 DIAGNOSIS — I213 ST elevation (STEMI) myocardial infarction of unspecified site: Secondary | ICD-10-CM

## 2016-09-29 DIAGNOSIS — Z951 Presence of aortocoronary bypass graft: Secondary | ICD-10-CM

## 2016-09-29 DIAGNOSIS — I2121 ST elevation (STEMI) myocardial infarction involving left circumflex coronary artery: Secondary | ICD-10-CM | POA: Diagnosis not present

## 2016-09-29 LAB — GLUCOSE, CAPILLARY
GLUCOSE-CAPILLARY: 130 mg/dL — AB (ref 65–99)
Glucose-Capillary: 156 mg/dL — ABNORMAL HIGH (ref 65–99)

## 2016-10-01 ENCOUNTER — Encounter (HOSPITAL_COMMUNITY)
Admission: RE | Admit: 2016-10-01 | Discharge: 2016-10-01 | Disposition: A | Discharge status: Home / Self Care | Payer: BLUE CROSS/BLUE SHIELD | Source: Ambulatory Visit | Attending: Cardiology | Admitting: Cardiology

## 2016-10-01 DIAGNOSIS — I2121 ST elevation (STEMI) myocardial infarction involving left circumflex coronary artery: Secondary | ICD-10-CM | POA: Diagnosis not present

## 2016-10-01 DIAGNOSIS — I213 ST elevation (STEMI) myocardial infarction of unspecified site: Secondary | ICD-10-CM

## 2016-10-01 DIAGNOSIS — Z951 Presence of aortocoronary bypass graft: Secondary | ICD-10-CM

## 2016-10-01 LAB — GLUCOSE, CAPILLARY
GLUCOSE-CAPILLARY: 203 mg/dL — AB (ref 65–99)
Glucose-Capillary: 186 mg/dL — ABNORMAL HIGH (ref 65–99)

## 2016-10-06 ENCOUNTER — Encounter (HOSPITAL_COMMUNITY)
Admission: RE | Admit: 2016-10-06 | Discharge: 2016-10-06 | Disposition: A | Discharge status: Home / Self Care | Payer: BLUE CROSS/BLUE SHIELD | Source: Ambulatory Visit | Attending: Cardiology | Admitting: Cardiology

## 2016-10-06 DIAGNOSIS — Z951 Presence of aortocoronary bypass graft: Secondary | ICD-10-CM

## 2016-10-06 DIAGNOSIS — I2121 ST elevation (STEMI) myocardial infarction involving left circumflex coronary artery: Secondary | ICD-10-CM | POA: Diagnosis not present

## 2016-10-06 DIAGNOSIS — I213 ST elevation (STEMI) myocardial infarction of unspecified site: Secondary | ICD-10-CM

## 2016-10-07 ENCOUNTER — Encounter: Payer: Self-pay | Admitting: *Deleted

## 2016-10-08 ENCOUNTER — Other Ambulatory Visit: Payer: Self-pay | Admitting: Physician Assistant

## 2016-10-08 ENCOUNTER — Encounter (HOSPITAL_COMMUNITY)
Admission: RE | Admit: 2016-10-08 | Discharge: 2016-10-08 | Disposition: A | Discharge status: Home / Self Care | Payer: BLUE CROSS/BLUE SHIELD | Source: Ambulatory Visit | Attending: Cardiology | Admitting: Cardiology

## 2016-10-08 DIAGNOSIS — I213 ST elevation (STEMI) myocardial infarction of unspecified site: Secondary | ICD-10-CM

## 2016-10-08 DIAGNOSIS — Z951 Presence of aortocoronary bypass graft: Secondary | ICD-10-CM

## 2016-10-08 DIAGNOSIS — I2121 ST elevation (STEMI) myocardial infarction involving left circumflex coronary artery: Secondary | ICD-10-CM | POA: Diagnosis not present

## 2016-10-12 NOTE — Progress Notes (Signed)
Cardiac Individual Treatment Plan  Patient Details  Name: [name] [name] MRN: [id number] Date of Birth: [birth year] Referring Provider:   Flowsheet Row CARDIAC REHAB PHASE II ORIENTATION from 09/23/2016 in [facility]  Referring Provider  Martinique, Peter MD      Initial Encounter Date:  [facility] PHASE II ORIENTATION from 09/23/2016 in [facility]  Date  09/23/16  Referring Provider  Martinique, Peter MD      Visit Diagnosis: No diagnosis found.  Patient's Home Medications on Admission:  Current Outpatient Prescriptions:  .  albuterol (PROVENTIL HFA;VENTOLIN HFA) 108 (90 Base) MCG/ACT inhaler, Inhale into the lungs every 6 (six) hours as needed for wheezing or shortness of breath., Disp: , Rfl:  .  aspirin EC 325 MG EC tablet, Take 1 tablet (325 mg total) by mouth daily., Disp: 30 tablet, Rfl: 0 .  atorvastatin (LIPITOR) 80 MG tablet, Take 1 tablet (80 mg total) by mouth daily at 6 PM., Disp: 30 tablet, Rfl: 1 .  cetirizine (ZYRTEC) 10 MG tablet, Take 10 mg by mouth daily.  , Disp: , Rfl:  .  Cholecalciferol (VITAMIN D) 2000 UNITS CAPS, Take 1 capsule by mouth daily as needed (vitamin supplement). , Disp: , Rfl:  .  citalopram (CELEXA) 20 MG tablet, Take 1 tablet by mouth daily, Disp: , Rfl:  .  dicyclomine (BENTYL) 20 MG tablet, Take 20 mg by mouth 3 (three) times daily., Disp: , Rfl:  .  fish oil-omega-3 fatty acids 1000 MG capsule, Take 2 g by mouth daily. , Disp: , Rfl:  .  fluticasone (FLONASE) 50 MCG/ACT nasal spray, Place 2 sprays into both nostrils daily., Disp: 48 g, Rfl: 3 .  fluticasone (FLOVENT HFA) 110 MCG/ACT inhaler, Inhale 2 puffs into the lungs 2 (two) times daily., Disp: 1 Inhaler, Rfl: 12 .  gabapentin (NEURONTIN) 300 MG capsule, Take 1 tablet by mouth in the AM and 2 tablets by mouth in the PM, Disp: , Rfl:  .  glimepiride (AMARYL) 4 MG tablet, Take 0.5 tablet by mouth twice daily with meals,  Disp: , Rfl:  .  glucose blood test strip, Use as instructed, Disp: 200 each, Rfl: 5 .  hydrOXYzine (ATARAX/VISTARIL) 25 MG tablet, Take 25 mg by mouth every 8 (eight) hours as needed for anxiety., Disp: , Rfl:  .  lisinopril (PRINIVIL,ZESTRIL) 5 MG tablet, Take 1 tablet (5 mg total) by mouth daily., Disp: 30 tablet, Rfl: 1 .  metoprolol succinate (TOPROL-XL) 50 MG 24 hr tablet, Take 1 tablet (50 mg total) by mouth daily. Take with or immediately following a meal., Disp: 30 tablet, Rfl: 1 .  Multiple Vitamin (MULTIVITAMIN) tablet, Take 1 tablet by mouth daily., Disp: , Rfl:  .  omeprazole (PRILOSEC) 20 MG capsule, Take 1 capsule by mouth daily, Disp: , Rfl:  .  ondansetron (ZOFRAN-ODT) 8 MG disintegrating tablet, DISSOLVE 1 ON TONGUE EVERY 12 HOURS AS NEEDED FOR NAUSEA, Disp: 30 tablet, Rfl: 3 .  traMADol (ULTRAM) 50 MG tablet, Take one or two by mouth every 4-6 hours PRN moderate pain, Disp: 10 tablet, Rfl: 0 .  triamcinolone cream (KENALOG) 0.1 %, Apply 1 application topically as needed., Disp: 30 g, Rfl: 2  Past Medical History: Past Medical History:  Diagnosis Date  . Allergy   . Anal fissure   . Anxiety   . Arthritis   . Asthma   . Cataract 2010    left  . Colon polyps  diverticulosis  03-2011/2005  . Depression   . Diabetes mellitus 2012   T2DM  . Diverticulosis   . Fatty liver 04/2011  . GERD (gastroesophageal reflux disease)   . Hyperlipidemia   . Hypertension severe   . Migraine   . Obesity   . OSA (obstructive sleep apnea)    C-Pap  . Pacemaker Medtronic   . Sinoatrial node dysfunction (HCC)    syncope    Tobacco Use: History  Smoking Status  . Former Smoker  . Types: Cigarettes  . Quit date: 10/12/1991  Smokeless Tobacco  . Never Used    Comment: Quit 20 yrs ago    Labs: Recent Review Flowsheet Data    Labs for ITP Cardiac and Pulmonary Rehab Latest Ref Rng & Units 07/23/2016 07/23/2016 07/23/2016 07/23/2016 07/23/2016   Cholestrol 0 - 200 mg/dL - - - -  -   LDLCALC 0 - 99 mg/dL - - - - -   LDLDIRECT mg/dL - - - - -   HDL >40 mg/dL - - - - -   Trlycerides <150 mg/dL - - - - -   Hemoglobin A1c 4.8 - 5.6 % - - - - -   PHART 7.350 - 7.450 7.297(L) - 7.319(L) 7.331(L) -   PCO2ART 32.0 - 48.0 mmHg 48.6(H) - 46.1 45.9 -   HCO3 20.0 - 28.0 mmol/L 23.7 - 23.7 24.2 -   TCO2 0 - 100 mmol/L 25 23 25 26 26    ACIDBASEDEF 0.0 - 2.0 mmol/L 3.0(H) - 2.0 2.0 -   O2SAT % 88.0 - 88.0 87.0 -      Capillary Blood Glucose: Lab Results  Component Value Date   GLUCAP 186 (H) 10/01/2016   GLUCAP 203 (H) 10/01/2016   GLUCAP 130 (H) 09/29/2016   GLUCAP 156 (H) 09/29/2016   GLUCAP 220 (H) 09/27/2016     Exercise Target Goals:    Exercise Program Goal: Individual exercise prescription set with THRR, safety & activity barriers. Participant demonstrates ability to understand and report RPE using BORG scale, to self-measure pulse accurately, and to acknowledge the importance of the exercise prescription.  Exercise Prescription Goal: Starting with aerobic activity 30 plus minutes a day, 3 days per week for initial exercise prescription. Provide home exercise prescription and guidelines that participant acknowledges understanding prior to discharge.  Activity Barriers & Risk Stratification:     Activity Barriers & Cardiac Risk Stratification - 09/23/16 0917      Activity Barriers & Cardiac Risk Stratification   Activity Barriers Right Knee Replacement   Cardiac Risk Stratification High      6 Minute Walk:     6 Minute Walk    Row Name 09/23/16 0957 09/23/16 1431       6 Minute Walk   Phase Initial  -    Distance 1508 feet  -    Walk Time 6 minutes  -    # of Rest Breaks 0  -    MPH  - 2.6    METS  - 2.9    RPE 11  -    VO2 Peak  - 10.3    Symptoms No  -    Resting HR 68 bpm  -    Resting BP 138/84  -    Max Ex. HR 83 bpm  -    Max Ex. BP 164/90  -    2 Minute Post BP  - 144/86       Initial Exercise Prescription:  Initial  Exercise Prescription - 09/23/16 1400      Date of Initial Exercise RX and Referring Provider   Date 09/23/16   Referring Provider Martinique, Peter MD     Bike   Level 1   Minutes 10   METs 2.75     NuStep   Level 1   Minutes 10   METs 2     Track   Laps 10   Minutes 10   METs 2.74     Prescription Details   Frequency (times per week) 3   Duration Progress to 45 minutes of aerobic exercise without signs/symptoms of physical distress     Intensity   THRR 40-80% of Max Heartrate 63-126   Ratings of Perceived Exertion 11-15   Perceived Dyspnea 0-4     Progression   Progression Continue to progress workloads to maintain intensity without signs/symptoms of physical distress.     Resistance Training   Training Prescription Yes   Weight 2   Reps 10-12      Perform Capillary Blood Glucose checks as needed.  Exercise Prescription Changes:      Exercise Prescription Changes    Row Name 10/13/16 1300             Exercise Review   Progression Yes         Response to Exercise   Blood Pressure (Admit) 133/77       Blood Pressure (Exercise) 152/82       Blood Pressure (Exit) 124/64       Heart Rate (Admit) 81 bpm       Heart Rate (Exercise) 110 bpm       Heart Rate (Exit) 81 bpm       Rating of Perceived Exertion (Exercise) 10       Duration Progress to 45 minutes of aerobic exercise without signs/symptoms of physical distress       Intensity THRR unchanged         Progression   Progression Continue to progress workloads to maintain intensity without signs/symptoms of physical distress.       Average METs 2.8         Resistance Training   Training Prescription Yes       Weight 2       Reps 10-12         Bike   Level 1       Minutes 10       METs 2.75         NuStep   Level 1       Minutes 10       METs 2.7         Track   Laps 12       Minutes 10       METs 3.09          Exercise Comments:      Exercise Comments    Row Name 10/13/16 1401            Exercise Comments Pt is off to a great start and doing well with exercise           Discharge Exercise Prescription (Final Exercise Prescription Changes):     Exercise Prescription Changes - 10/13/16 1300      Exercise Review   Progression Yes     Response to Exercise   Blood Pressure (Admit) 133/77   Blood Pressure (Exercise) 152/82   Blood Pressure (Exit) 124/64   Heart  Rate (Admit) 81 bpm   Heart Rate (Exercise) 110 bpm   Heart Rate (Exit) 81 bpm   Rating of Perceived Exertion (Exercise) 10   Duration Progress to 45 minutes of aerobic exercise without signs/symptoms of physical distress   Intensity THRR unchanged     Progression   Progression Continue to progress workloads to maintain intensity without signs/symptoms of physical distress.   Average METs 2.8     Resistance Training   Training Prescription Yes   Weight 2   Reps 10-12     Bike   Level 1   Minutes 10   METs 2.75     NuStep   Level 1   Minutes 10   METs 2.7     Track   Laps 12   Minutes 10   METs 3.09      Nutrition:  Target Goals: Understanding of nutrition guidelines, daily intake of sodium 1500mg , cholesterol 200mg , calories 30% from fat and 7% or less from saturated fats, daily to have 5 or more servings of fruits and vegetables.  Biometrics:     Pre Biometrics - 09/23/16 1432      Pre Biometrics   % Body Fat 53.8 %       Nutrition Therapy Plan and Nutrition Goals:     Nutrition Therapy & Goals - 09/24/16 1413      Nutrition Therapy   Diet Carb Modified, Therapeutic Lifestyle Changes-     Personal Nutrition Goals   Personal Goal #1 1-2 lb wt loss/week to a wt loss goal of 6-24 lb at graduation from [employer], educate and counsel regarding individualized specific dietary modifications aiming towards targeted core components such as weight, hypertension, lipid management, diabetes, heart failure and other  comorbidities.   Expected Outcomes Short Term Goal: Understand basic principles of dietary content, such as calories, fat, sodium, cholesterol and nutrients.;Long Term Goal: Adherence to prescribed nutrition plan.      Nutrition Discharge: Nutrition Scores:   Nutrition Goals Re-Evaluation:   Psychosocial: Target Goals: Acknowledge presence or absence of depression, maximize coping skills, provide positive support system. Participant is able to verbalize types and ability to use techniques and skills needed for reducing stress and depression.  Initial Review & Psychosocial Screening:     Initial Psych Review & Screening - 09/23/16 [place]? Yes  pt has a very strong support group with family and church staff and freinds.   Comments Brief assessment completed     Barriers   Psychosocial barriers to participate in program There are no identifiable barriers or psychosocial needs.     Screening Interventions   Interventions Encouraged to exercise      Quality of Life Scores:     Quality of Life - 09/23/16 1149      Quality of Life Scores   Health/Function Pre 26.04 %   Socioeconomic Pre 30 %   Psych/Spiritual Pre 24.86 %   Family Pre 24.17 %   GLOBAL Pre 26.48 %      PHQ-9: Recent Review Flowsheet Data    Depression screen [facility] 2/9 09/27/2016 09/15/2016 07/20/2016 04/26/2016 03/06/2015   Decreased Interest 0 0 0 0 0   Down, Depressed, Hopeless 0 0 0 0 0   PHQ - 2 Score 0 0 0 0 0      Psychosocial Evaluation and Intervention:   Psychosocial Re-Evaluation:  Psychosocial Re-Evaluation    Row Name 10/12/16 1651             Psychosocial Re-Evaluation   Interventions Encouraged to attend Cardiac Rehabilitation for the exercise       Continued Psychosocial Services Needed No          Vocational Rehabilitation: Provide vocational rehab assistance to qualifying candidates.   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      Education: Education Goals: Education classes will be provided on a weekly basis, covering required topics. Participant will state understanding/return demonstration of topics presented.  Learning Barriers/Preferences:     Learning Barriers/Preferences - 09/23/16 0917      Learning Barriers/Preferences   Learning Barriers Sight   Learning Preferences Written Material;Skilled Demonstration;Video;Pictoral      Education Topics: Count Your Pulse:  -Group instruction provided by verbal instruction, demonstration, patient participation and written materials to support subject.  Instructors address importance of being able to find your pulse and how to count your pulse when at home without a heart monitor.  Patients get hands on experience counting their pulse with staff help and individually.   Heart Attack, Angina, and Risk Factor Modification:  -Group instruction provided by verbal instruction, video, and written materials to support subject.  Instructors address signs and symptoms of angina and heart attacks.    Also discuss risk factors for heart disease and how to make changes to improve heart health risk factors.   Functional Fitness:  -Group instruction provided by verbal instruction, demonstration, patient participation, and written materials to support subject.  Instructors address safety measures for doing things around the house.  Discuss how to get up and down off the floor, how to pick things up properly, how to safely get out of a chair without assistance, and balance training.   Meditation and Mindfulness:  -Group instruction provided by verbal instruction, patient participation, and written materials to  support subject.  Instructor addresses importance of mindfulness and meditation practice to help reduce stress and improve awareness.  Instructor also leads participants through a meditation exercise.    Stretching for Flexibility and Mobility:  -Group instruction provided by verbal instruction, patient participation, and written materials to support subject.  Instructors lead participants through series of stretches that are designed to increase flexibility thus improving mobility.  These stretches are additional exercise for major muscle groups that are typically performed during regular warm up and cool down.   Hands Only CPR Anytime:  -Group instruction provided by verbal instruction, video, patient participation and written materials to support subject.  Instructors co-teach with AHA video for hands only CPR.  Participants get hands on experience with mannequins.   Nutrition I class: Heart Healthy Eating:  -Group instruction provided by PowerPoint slides, verbal discussion, and written materials to support subject matter. The instructor gives an explanation and review of the Therapeutic Lifestyle Changes diet recommendations, which includes a discussion on lipid goals, dietary fat, sodium, fiber, plant stanol/sterol esters, sugar, and the components of a well-balanced, healthy diet.   Nutrition II class: Lifestyle Skills:  -Group instruction provided by PowerPoint slides, verbal discussion, and written materials to support subject matter. The instructor gives an explanation and review of label reading, grocery  shopping for heart health, heart healthy recipe modifications, and ways to make healthier choices when eating out.   Diabetes Question & Answer:  -Group instruction provided by PowerPoint slides, verbal discussion, and written materials to support subject matter. The instructor gives an explanation and review of diabetes co-morbidities, pre- and post-prandial blood glucose goals,  pre-exercise blood glucose goals, signs, symptoms, and treatment of hypoglycemia and hyperglycemia, and foot care basics.   Diabetes Blitz:  -Group instruction provided by PowerPoint slides, verbal discussion, and written materials to support subject matter. The instructor gives an explanation and review of the physiology behind type 1 and type 2 diabetes, diabetes medications and rational behind using different medications, pre- and post-prandial blood glucose recommendations and Hemoglobin A1c goals, diabetes diet, and exercise including blood glucose guidelines for exercising safely.    Portion Distortion:  -Group instruction provided by PowerPoint slides, verbal discussion, written materials, and food models to support subject matter. The instructor gives an explanation of serving size versus portion size, changes in portions sizes over the last 20 years, and what consists of a serving from each food group.   Stress Management:  -Group instruction provided by verbal instruction, video, and written materials to support subject matter.  Instructors review role of stress in heart disease and how to cope with stress positively.     Exercising on Your Own:  -Group instruction provided by verbal instruction, power point, and written materials to support subject.  Instructors discuss benefits of exercise, components of exercise, frequency and intensity of exercise, and end points for exercise.  Also discuss use of nitroglycerin and activating EMS.  Review options of places to exercise outside of rehab.  Review guidelines for sex with heart disease.   Cardiac Drugs I:  -Group instruction provided by verbal instruction and written materials to support subject.  Instructor reviews cardiac drug classes: antiplatelets, anticoagulants, beta blockers, and statins.  Instructor discusses reasons, side effects, and lifestyle considerations for each drug class.   Cardiac Drugs II:  -Group instruction  provided by verbal instruction and written materials to support subject.  Instructor reviews cardiac drug classes: angiotensin converting enzyme inhibitors (ACE-I), angiotensin II receptor blockers (ARBs), nitrates, and calcium channel blockers.  Instructor discusses reasons, side effects, and lifestyle considerations for each drug class.   Anatomy and Physiology of the Circulatory System:  -Group instruction provided by verbal instruction, video, and written materials to support subject.  Reviews functional anatomy of heart, how it relates to various diagnoses, and what role the heart plays in the overall system.   Knowledge Questionnaire Score:     Knowledge Questionnaire Score - 09/23/16 0956      Knowledge Questionnaire Score   Pre Score 21/24      Core Components/Risk Factors/Patient Goals at Admission:     Personal Goals and Risk Factors at Admission - 09/23/16 1019      Core Components/Risk Factors/Patient Goals on Admission   Personal Goal To be motivated to exercise and learn more about nutrition to help with diabetes management   Intervention Provide nutrition counseling and exercise programming to assist with making dietary changes and increasing exercise tolerance/aerobic fitness      Core Components/Risk Factors/Patient Goals Review:      Goals and Risk Factor Review    Row Name 10/13/16 1402             Core Components/Risk Factors/Patient Goals Review   Personal Goals Review Weight Management/Obesity;Increase Strength and Stamina       Review  Pt states she has not lost weight but she feels like her energy level has already improved.  she wants to focus on building back her upper body strength.        Expected Outcomes Continue with routine exercicse program in order to increase strength, stamina and acheive weight loss goal          Core Components/Risk Factors/Patient Goals at Discharge (Final Review):      Goals and Risk Factor Review - 10/13/16 1402       Core Components/Risk Factors/Patient Goals Review   Personal Goals Review Weight Management/Obesity;Increase Strength and Stamina   Review Pt states she has not lost weight but she feels like her energy level has already improved.  she wants to focus on building back her upper body strength.    Expected Outcomes Continue with routine exercicse program in order to increase strength, stamina and acheive weight loss goal      ITP Comments:     ITP Comments    Row Name 09/23/16 0914           ITP Comments Dr. Fransico Him Medical Director          Comments: Tye Maryland is making expected progress toward personal goals after completing 6 sessions. Recommend continued exercise and life style modification education including  stress management and relaxation techniques to decrease cardiac risk profile. Tye Maryland is enjoying participating in phase 2 cardiac rehab.Barnet Pall, RN,BSN 10/14/2016 10:24 AM

## 2016-10-13 ENCOUNTER — Encounter (HOSPITAL_COMMUNITY)
Admission: RE | Admit: 2016-10-13 | Discharge: 2016-10-13 | Disposition: A | Discharge status: Home / Self Care | Payer: BLUE CROSS/BLUE SHIELD | Source: Ambulatory Visit | Attending: Cardiology | Admitting: Cardiology

## 2016-10-13 DIAGNOSIS — I2121 ST elevation (STEMI) myocardial infarction involving left circumflex coronary artery: Secondary | ICD-10-CM | POA: Insufficient documentation

## 2016-10-13 DIAGNOSIS — Z951 Presence of aortocoronary bypass graft: Secondary | ICD-10-CM | POA: Insufficient documentation

## 2016-10-15 ENCOUNTER — Encounter (HOSPITAL_COMMUNITY)
Admission: RE | Admit: 2016-10-15 | Discharge: 2016-10-15 | Disposition: A | Discharge status: Home / Self Care | Payer: BLUE CROSS/BLUE SHIELD | Source: Ambulatory Visit | Attending: Cardiology | Admitting: Cardiology

## 2016-10-18 ENCOUNTER — Encounter (HOSPITAL_COMMUNITY)
Admission: RE | Admit: 2016-10-18 | Discharge: 2016-10-18 | Disposition: A | Discharge status: Home / Self Care | Payer: BLUE CROSS/BLUE SHIELD | Source: Ambulatory Visit | Attending: Cardiology | Admitting: Cardiology

## 2016-10-18 DIAGNOSIS — I213 ST elevation (STEMI) myocardial infarction of unspecified site: Secondary | ICD-10-CM

## 2016-10-18 DIAGNOSIS — Z951 Presence of aortocoronary bypass graft: Secondary | ICD-10-CM

## 2016-10-18 NOTE — Progress Notes (Signed)
Reviewed home exercise program with pt.  Discussed mode/frequency of exercise, THRR, RPE scale and weather conditions for exercising outdoors.  Also discussed signs and symptoms and when to call Dr./911.  Pt verbalized understanding.  Cleda Mccreedy, MS 10/18/2016 16:27

## 2016-10-20 ENCOUNTER — Encounter (HOSPITAL_COMMUNITY)
Admission: RE | Admit: 2016-10-20 | Discharge: 2016-10-20 | Disposition: A | Discharge status: Home / Self Care | Payer: BLUE CROSS/BLUE SHIELD | Source: Ambulatory Visit | Attending: Cardiology | Admitting: Cardiology

## 2016-10-20 ENCOUNTER — Ambulatory Visit (INDEPENDENT_AMBULATORY_CARE_PROVIDER_SITE_OTHER): Payer: Self-pay | Admitting: Cardiothoracic Surgery

## 2016-10-20 ENCOUNTER — Encounter: Payer: Self-pay | Admitting: Cardiothoracic Surgery

## 2016-10-20 VITALS — BP 142/89 | HR 79 | Resp 16 | Ht 62.0 in | Wt 239.0 lb

## 2016-10-20 DIAGNOSIS — L03119 Cellulitis of unspecified part of limb: Secondary | ICD-10-CM

## 2016-10-20 DIAGNOSIS — I213 ST elevation (STEMI) myocardial infarction of unspecified site: Secondary | ICD-10-CM

## 2016-10-20 DIAGNOSIS — Z951 Presence of aortocoronary bypass graft: Secondary | ICD-10-CM

## 2016-10-20 NOTE — Progress Notes (Signed)
PCP is Pcp Not In System Referring Provider is Deboraha Sprang, MD  Chief Complaint  Patient presents with  . 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 On her last office visit she had some cellulitis of the right leg was placed on antibiotics. She returns now for assessment and review progress. The right leg incision has healed completely. The patient is completing phase II cardiac rehabilitation at the hospital. She is making progress with her exercise tolerance and has no recurrent symptoms of angina. Her complaint mainly is left neck and shoulder discomfort probably from the sternal incision and this should improve with time and therapies she is receiving at rehabilitation.  We discussed her return to work. She should be able to return to work late January-January 29 but should not lift more than 20 pounds until March 1.   Past Medical History:  Diagnosis Date  . Allergy   . Anal fissure   . Anxiety   . Arthritis   . Asthma   . Cataract 2010    left  . Colon polyps    diverticulosis  03-2011/2005  . Depression   . Diabetes mellitus 2012   T2DM  . Diverticulosis   . Fatty liver 04/2011  . GERD (gastroesophageal reflux disease)   . Hyperlipidemia   . Hypertension severe   . Migraine   . Obesity   . OSA (obstructive sleep apnea)    C-Pap  . Pacemaker Medtronic   . Sinoatrial node dysfunction (HCC)    syncope    Past Surgical History:  Procedure Laterality Date  . ABDOMINAL HYSTERECTOMY  2000   total  . CARDIAC CATHETERIZATION N/A 07/20/2016   Procedure: Left Heart Cath and Coronary Angiography;  Surgeon: Peter M Martinique, MD;  Location: [facility];  Service: Cardiovascular;  Laterality: N/A;  . CORONARY ARTERY BYPASS GRAFT N/A 07/22/2016   Procedure: CORONARY ARTERY BYPASS GRAFTING (CABG) x three , using left internal mammary artery and right leg greater saphenous vein harvested  endoscopically;  Surgeon: Ivin Poot, MD;  Location: [facility];  Service: Open Heart Surgery;  Laterality: N/A;  . EYE SURGERY Right 2010  . OOPHORECTOMY    . PACEMAKER PLACEMENT    . T/A right knee    . TEE WITHOUT CARDIOVERSION N/A 07/22/2016   Procedure: TRANSESOPHAGEAL ECHOCARDIOGRAM (TEE);  Surgeon: Ivin Poot, MD;  Location: Home;  Service: Open Heart Surgery;  Laterality: N/A;  . TONSILLECTOMY      Family History  Problem Relation Age of Onset  . Heart disease Mother   . Cancer Mother 51    lung  . Breast cancer Mother     maternal grandmother  . Diabetes Sister     type 2  . Breast cancer Brother   . Cancer Maternal Grandmother     breast  . Leukemia Paternal Grandmother     neoplast  . Aneurysm Paternal Grandfather     abdominal (stomach)  . Heart disease Father   . Colon cancer Neg Hx     Social History Social History  Substance Use Topics  . Smoking status: Former Smoker    Types: Cigarettes    Quit date: 10/12/1991  . Smokeless tobacco: Never Used     Comment: Quit 20 yrs ago  . Alcohol use No    Current Outpatient Prescriptions  Medication Sig Dispense Refill  . albuterol (  PROVENTIL HFA;VENTOLIN HFA) 108 (90 Base) MCG/ACT inhaler Inhale into the lungs every 6 (six) hours as needed for wheezing or shortness of breath.    Marland Kitchen aspirin EC 325 MG EC tablet Take 1 tablet (325 mg total) by mouth daily. 30 tablet 0  . atorvastatin (LIPITOR) 80 MG tablet Take 1 tablet (80 mg total) by mouth daily at 6 PM. 30 tablet 1  . cetirizine (ZYRTEC) 10 MG tablet Take 10 mg by mouth daily.      . Cholecalciferol (VITAMIN D) 2000 UNITS CAPS Take 1 capsule by mouth daily as needed (vitamin supplement).     . citalopram (CELEXA) 20 MG tablet Take 1 tablet by mouth daily    . dicyclomine (BENTYL) 20 MG tablet Take 20 mg by mouth 3 (three) times daily.    . fish oil-omega-3 fatty acids 1000 MG capsule Take 2 g by mouth daily.     . fluticasone (FLONASE) 50 MCG/ACT nasal  spray Place 2 sprays into both nostrils daily. 48 g 3  . fluticasone (FLOVENT HFA) 110 MCG/ACT inhaler Inhale 2 puffs into the lungs 2 (two) times daily. 1 Inhaler 12  . gabapentin (NEURONTIN) 300 MG capsule Take 1 tablet by mouth in the AM and 2 tablets by mouth in the PM    . glimepiride (AMARYL) 4 MG tablet Take 0.5 tablet by mouth twice daily with meals    . glucose blood test strip Use as instructed 200 each 5  . hydrOXYzine (ATARAX/VISTARIL) 25 MG tablet Take 25 mg by mouth every 8 (eight) hours as needed for anxiety.    Marland Kitchen lisinopril (PRINIVIL,ZESTRIL) 5 MG tablet Take 1 tablet (5 mg total) by mouth daily. 30 tablet 1  . metoprolol succinate (TOPROL-XL) 50 MG 24 hr tablet Take 1 tablet (50 mg total) by mouth daily. Take with or immediately following a meal. 30 tablet 1  . Multiple Vitamin (MULTIVITAMIN) tablet Take 1 tablet by mouth daily.    Marland Kitchen omeprazole (PRILOSEC) 20 MG capsule Take 1 capsule by mouth daily    . ondansetron (ZOFRAN-ODT) 8 MG disintegrating tablet DISSOLVE 1 ON TONGUE EVERY 12 HOURS AS NEEDED FOR NAUSEA 30 tablet 3  . traMADol (ULTRAM) 50 MG tablet Take one or two by mouth every 4-6 hours PRN moderate pain 10 tablet 0  . triamcinolone cream (KENALOG) 0.1 % Apply 1 application topically as needed. 30 g 2   No current facility-administered medications for this visit.     Allergies  Allergen Reactions  . Adhesive [Tape] Other (See Comments)    SKIN BLISTERS  . Penicillins     Swelling   . Red Yeast Rice Hives  . Sulfa Drugs Cross Reactors Hives    Review of Systems  No complaint other than musculoskeletal pain in her left neck  BP (!) 142/89 (BP Location: Left Arm, Patient Position: Sitting, Cuff Size: Large)   Pulse 79   Resp 16   Ht 5\' 2"  (1.575 m)   Wt 239 lb (108.4 kg)   SpO2 93% Comment: ON RA  BMI 43.71 kg/m  Physical Exam   Diagnostic Tests: Previous chest x-rays personally reviewed The patient had some postoperative compression of lung  parenchyma in the left upper lobe from a chest tube. This has resolved on subsequent chest x-ray. I do not feel that a CT scan is indicated and would just expose the patient to more radiation.  Impression: Good progress after CABG 2-1/2 months ago  Plan: We discussed heart healthy lifestyle and  diet and importance of compliance with medical therapy. She will return as needed.   Len Childs, MD [facility] [phone]

## 2016-10-22 ENCOUNTER — Encounter (HOSPITAL_COMMUNITY)
Admission: RE | Admit: 2016-10-22 | Discharge: 2016-10-22 | Disposition: A | Discharge status: Home / Self Care | Payer: BLUE CROSS/BLUE SHIELD | Source: Ambulatory Visit | Attending: Cardiology | Admitting: Cardiology

## 2016-10-22 DIAGNOSIS — I213 ST elevation (STEMI) myocardial infarction of unspecified site: Secondary | ICD-10-CM

## 2016-10-22 DIAGNOSIS — Z951 Presence of aortocoronary bypass graft: Secondary | ICD-10-CM

## 2016-10-25 ENCOUNTER — Encounter: Payer: Self-pay | Admitting: Physician Assistant

## 2016-10-25 ENCOUNTER — Encounter (HOSPITAL_COMMUNITY)
Admission: RE | Admit: 2016-10-25 | Discharge: 2016-10-25 | Disposition: A | Discharge status: Home / Self Care | Payer: BLUE CROSS/BLUE SHIELD | Source: Ambulatory Visit | Attending: Cardiology | Admitting: Cardiology

## 2016-10-25 DIAGNOSIS — Z951 Presence of aortocoronary bypass graft: Secondary | ICD-10-CM

## 2016-10-25 DIAGNOSIS — I213 ST elevation (STEMI) myocardial infarction of unspecified site: Secondary | ICD-10-CM

## 2016-10-25 NOTE — Progress Notes (Signed)
Intermittent exertional blood pressure elevations noted.Will fax exercise flow sheets to Dr. Aquilla Hacker office for review. Barnet Pall, RN,BSN 10/25/2016 11:01 AM

## 2016-10-26 ENCOUNTER — Telehealth (HOSPITAL_COMMUNITY): Payer: Self-pay | Admitting: *Deleted

## 2016-10-26 ENCOUNTER — Telehealth: Payer: Self-pay | Admitting: Internal Medicine

## 2016-10-26 ENCOUNTER — Other Ambulatory Visit: Payer: Self-pay | Admitting: Internal Medicine

## 2016-10-26 ENCOUNTER — Encounter: Payer: Self-pay | Admitting: Physician Assistant

## 2016-10-26 ENCOUNTER — Telehealth: Payer: Self-pay | Admitting: Physician Assistant

## 2016-10-26 DIAGNOSIS — Z1239 Encounter for other screening for malignant neoplasm of breast: Secondary | ICD-10-CM

## 2016-10-26 MED ORDER — LISINOPRIL 5 MG PO TABS: 5.0000 mg | ORAL_TABLET | Freq: Every day | ORAL | 9 refills | Status: DC

## 2016-10-26 NOTE — Telephone Encounter (Signed)
Medication Detail    Disp Refills Start End   lisinopril (PRINIVIL,ZESTRIL) 5 MG tablet 30 tablet 9 10/26/2016    Sig - Route: Take 1 tablet (5 mg total) by mouth daily. - Oral   E-Prescribing Status: Receipt confirmed by pharmacy (10/26/2016 12:57 PM EST)   Pharmacy   [place], [place]

## 2016-10-26 NOTE — Telephone Encounter (Signed)
Mammogram ordered

## 2016-10-26 NOTE — Telephone Encounter (Signed)
RX sent in for lisinopril.

## 2016-10-26 NOTE — Telephone Encounter (Signed)
°*  STAT* If patient is at the pharmacy, call can be transferred to refill team.   1. Which medications need to be refilled? (please list name of each medication and dose if known) Lisinopril needs this today, pt been without for 7 days  2. Which pharmacy/location (including street and city if local pharmacy) is medication to be sent to?[place]  3. Do they need a 30 day or 90 day supply? ?

## 2016-10-26 NOTE — Telephone Encounter (Signed)
New Message      *STAT* If patient is at the pharmacy, call can be transferred to refill team.   1. Which medications need to be refilled? (please list name of each medication and dose if known)  lisinopril (PRINIVIL,ZESTRIL) 5 MG tablet Take 1 tablet (5 mg total) by mouth daily.     2. Which pharmacy/location (including street and city if local pharmacy) is medication to be sent to? [facility] on [address]   3. Do they need a 30 day or 90 day supply? [place]

## 2016-10-27 ENCOUNTER — Encounter (HOSPITAL_COMMUNITY): Payer: BLUE CROSS/BLUE SHIELD

## 2016-10-29 ENCOUNTER — Encounter (HOSPITAL_COMMUNITY)
Admission: RE | Admit: 2016-10-29 | Discharge: 2016-10-29 | Disposition: A | Discharge status: Home / Self Care | Payer: BLUE CROSS/BLUE SHIELD | Source: Ambulatory Visit | Attending: Cardiology | Admitting: Cardiology

## 2016-10-29 DIAGNOSIS — I213 ST elevation (STEMI) myocardial infarction of unspecified site: Secondary | ICD-10-CM

## 2016-10-29 DIAGNOSIS — Z951 Presence of aortocoronary bypass graft: Secondary | ICD-10-CM

## 2016-11-01 ENCOUNTER — Encounter (HOSPITAL_COMMUNITY)
Admission: RE | Admit: 2016-11-01 | Discharge: 2016-11-01 | Disposition: A | Discharge status: Home / Self Care | Payer: BLUE CROSS/BLUE SHIELD | Source: Ambulatory Visit | Attending: Cardiology | Admitting: Cardiology

## 2016-11-01 DIAGNOSIS — Z951 Presence of aortocoronary bypass graft: Secondary | ICD-10-CM

## 2016-11-01 DIAGNOSIS — I213 ST elevation (STEMI) myocardial infarction of unspecified site: Secondary | ICD-10-CM

## 2016-11-02 ENCOUNTER — Other Ambulatory Visit: Payer: Self-pay

## 2016-11-02 MED ORDER — ATORVASTATIN CALCIUM 80 MG PO TABS: 80.0000 mg | ORAL_TABLET | Freq: Every day | ORAL | 3 refills | Status: DC

## 2016-11-03 ENCOUNTER — Encounter (HOSPITAL_COMMUNITY)
Admission: RE | Admit: 2016-11-03 | Discharge: 2016-11-03 | Disposition: A | Discharge status: Home / Self Care | Payer: BLUE CROSS/BLUE SHIELD | Source: Ambulatory Visit | Attending: Cardiology | Admitting: Cardiology

## 2016-11-03 DIAGNOSIS — I213 ST elevation (STEMI) myocardial infarction of unspecified site: Secondary | ICD-10-CM

## 2016-11-03 DIAGNOSIS — Z951 Presence of aortocoronary bypass graft: Secondary | ICD-10-CM

## 2016-11-05 ENCOUNTER — Encounter (HOSPITAL_COMMUNITY)
Admission: RE | Admit: 2016-11-05 | Discharge: 2016-11-05 | Disposition: A | Discharge status: Home / Self Care | Payer: BLUE CROSS/BLUE SHIELD | Source: Ambulatory Visit | Attending: Cardiology | Admitting: Cardiology

## 2016-11-05 DIAGNOSIS — I213 ST elevation (STEMI) myocardial infarction of unspecified site: Secondary | ICD-10-CM

## 2016-11-05 DIAGNOSIS — Z951 Presence of aortocoronary bypass graft: Secondary | ICD-10-CM

## 2016-11-08 ENCOUNTER — Encounter (HOSPITAL_COMMUNITY)
Admission: RE | Admit: 2016-11-08 | Discharge: 2016-11-08 | Disposition: A | Discharge status: Home / Self Care | Payer: BLUE CROSS/BLUE SHIELD | Source: Ambulatory Visit | Attending: Cardiology | Admitting: Cardiology

## 2016-11-08 DIAGNOSIS — I213 ST elevation (STEMI) myocardial infarction of unspecified site: Secondary | ICD-10-CM

## 2016-11-08 DIAGNOSIS — Z951 Presence of aortocoronary bypass graft: Secondary | ICD-10-CM

## 2016-11-09 NOTE — Progress Notes (Signed)
Cardiac Individual Treatment Plan  Patient Details  Name: [name] [name] MRN: [id number] Date of Birth: 05/01/1956 Referring Provider:   Flowsheet Row CARDIAC REHAB PHASE II ORIENTATION from 09/23/2016 in [place]  Referring Provider  Martinique, Peter MD      Initial Encounter Date:  [name] PHASE II ORIENTATION from 09/23/2016 in [facility]  Date  09/23/16  Referring Provider  Martinique, Peter MD      Visit Diagnosis: 07/22/16 S/P CABG x 3  07/20/16 ST elevation myocardial infarction (STEMI), unspecified artery ([place])  Patient's Home Medications on Admission:  Current Outpatient Prescriptions:  .  albuterol (PROVENTIL HFA;VENTOLIN HFA) 108 (90 Base) MCG/ACT inhaler, Inhale into the lungs every 6 (six) hours as needed for wheezing or shortness of breath., Disp: , Rfl:  .  aspirin EC 325 MG EC tablet, Take 1 tablet (325 mg total) by mouth daily., Disp: 30 tablet, Rfl: 0 .  atorvastatin (LIPITOR) 80 MG tablet, Take 1 tablet (80 mg total) by mouth daily at 6 PM., Disp: 30 tablet, Rfl: 3 .  cetirizine (ZYRTEC) 10 MG tablet, Take 10 mg by mouth daily.  , Disp: , Rfl:  .  Cholecalciferol (VITAMIN D) 2000 UNITS CAPS, Take 1 capsule by mouth daily as needed (vitamin supplement). , Disp: , Rfl:  .  citalopram (CELEXA) 20 MG tablet, Take 1 tablet by mouth daily, Disp: , Rfl:  .  dicyclomine (BENTYL) 20 MG tablet, Take 20 mg by mouth 3 (three) times daily., Disp: , Rfl:  .  fish oil-omega-3 fatty acids 1000 MG capsule, Take 2 g by mouth daily. , Disp: , Rfl:  .  fluticasone (FLONASE) 50 MCG/ACT nasal spray, Place 2 sprays into both nostrils daily., Disp: 48 g, Rfl: 3 .  fluticasone (FLOVENT HFA) 110 MCG/ACT inhaler, Inhale 2 puffs into the lungs 2 (two) times daily., Disp: 1 Inhaler, Rfl: 12 .  gabapentin (NEURONTIN) 300 MG capsule, Take 1 tablet by mouth in the AM and 2 tablets by mouth in the PM, Disp: , Rfl:  .   glimepiride (AMARYL) 4 MG tablet, Take 0.5 tablet by mouth twice daily with meals, Disp: , Rfl:  .  glucose blood test strip, Use as instructed, Disp: 200 each, Rfl: 5 .  hydrOXYzine (ATARAX/VISTARIL) 25 MG tablet, Take 25 mg by mouth every 8 (eight) hours as needed for anxiety., Disp: , Rfl:  .  lisinopril (PRINIVIL,ZESTRIL) 5 MG tablet, Take 1 tablet (5 mg total) by mouth daily., Disp: 30 tablet, Rfl: 9 .  metoprolol succinate (TOPROL-XL) 50 MG 24 hr tablet, Take 1 tablet (50 mg total) by mouth daily. Take with or immediately following a meal., Disp: 30 tablet, Rfl: 1 .  Multiple Vitamin (MULTIVITAMIN) tablet, Take 1 tablet by mouth daily., Disp: , Rfl:  .  omeprazole (PRILOSEC) 20 MG capsule, Take 1 capsule by mouth daily, Disp: , Rfl:  .  ondansetron (ZOFRAN-ODT) 8 MG disintegrating tablet, DISSOLVE 1 ON TONGUE EVERY 12 HOURS AS NEEDED FOR NAUSEA, Disp: 30 tablet, Rfl: 3 .  traMADol (ULTRAM) 50 MG tablet, Take one or two by mouth every 4-6 hours PRN moderate pain, Disp: 10 tablet, Rfl: 0 .  triamcinolone cream (KENALOG) 0.1 %, Apply 1 application topically as needed., Disp: 30 g, Rfl: 2  Past Medical History: Past Medical History:  Diagnosis Date  . Allergy   . Anal fissure   . Anxiety   . Arthritis   . Asthma   .  Cataract 2010    left  . Colon polyps    diverticulosis  03-2011/2005  . Depression   . Diabetes mellitus 2012   T2DM  . Diverticulosis   . Fatty liver 04/2011  . GERD (gastroesophageal reflux disease)   . Hyperlipidemia   . Hypertension severe   . Migraine   . Obesity   . OSA (obstructive sleep apnea)    C-Pap  . Pacemaker Medtronic   . Sinoatrial node dysfunction (HCC)    syncope    Tobacco Use: History  Smoking Status  . Former Smoker  . Types: Cigarettes  . Quit date: 10/12/1991  Smokeless Tobacco  . Never Used    Comment: Quit 20 yrs ago    Labs: Recent Review Flowsheet Data    Labs for ITP Cardiac and Pulmonary Rehab Latest Ref Rng & Units  07/23/2016 07/23/2016 07/23/2016 07/23/2016 07/23/2016   Cholestrol 0 - 200 mg/dL - - - - -   LDLCALC 0 - 99 mg/dL - - - - -   LDLDIRECT mg/dL - - - - -   HDL >40 mg/dL - - - - -   Trlycerides <150 mg/dL - - - - -   Hemoglobin A1c 4.8 - 5.6 % - - - - -   PHART 7.350 - 7.450 7.297(L) - 7.319(L) 7.331(L) -   PCO2ART 32.0 - 48.0 mmHg 48.6(H) - 46.1 45.9 -   HCO3 20.0 - 28.0 mmol/L 23.7 - 23.7 24.2 -   TCO2 0 - 100 mmol/L 25 23 25 26 26    ACIDBASEDEF 0.0 - 2.0 mmol/L 3.0(H) - 2.0 2.0 -   O2SAT % 88.0 - 88.0 87.0 -      Capillary Blood Glucose: Lab Results  Component Value Date   GLUCAP 186 (H) 10/01/2016   GLUCAP 203 (H) 10/01/2016   GLUCAP 130 (H) 09/29/2016   GLUCAP 156 (H) 09/29/2016   GLUCAP 220 (H) 09/27/2016     Exercise Target Goals:    Exercise Program Goal: Individual exercise prescription set with THRR, safety & activity barriers. Participant demonstrates ability to understand and report RPE using BORG scale, to self-measure pulse accurately, and to acknowledge the importance of the exercise prescription.  Exercise Prescription Goal: Starting with aerobic activity 30 plus minutes a day, 3 days per week for initial exercise prescription. Provide home exercise prescription and guidelines that participant acknowledges understanding prior to discharge.  Activity Barriers & Risk Stratification:     Activity Barriers & Cardiac Risk Stratification - 09/23/16 0917      Activity Barriers & Cardiac Risk Stratification   Activity Barriers Right Knee Replacement   Cardiac Risk Stratification High      6 Minute Walk:     6 Minute Walk    Row Name 09/23/16 0957 09/23/16 1431       6 Minute Walk   Phase Initial  -    Distance 1508 feet  -    Walk Time 6 minutes  -    # of Rest Breaks 0  -    MPH  - 2.6    METS  - 2.9    RPE 11  -    VO2 Peak  - 10.3    Symptoms No  -    Resting HR 68 bpm  -    Resting BP 138/84  -    Max Ex. HR 83 bpm  -    Max Ex. BP 164/90   -    2 Minute Post BP  -  144/86       Initial Exercise Prescription:     Initial Exercise Prescription - 09/23/16 1400      Date of Initial Exercise RX and Referring Provider   Date 09/23/16   Referring Provider Martinique, Peter MD     Bike   Level 1   Minutes 10   METs 2.75     NuStep   Level 1   Minutes 10   METs 2     Track   Laps 10   Minutes 10   METs 2.74     Prescription Details   Frequency (times per week) 3   Duration Progress to 45 minutes of aerobic exercise without signs/symptoms of physical distress     Intensity   THRR 40-80% of Max Heartrate 63-126   Ratings of Perceived Exertion 11-15   Perceived Dyspnea 0-4     Progression   Progression Continue to progress workloads to maintain intensity without signs/symptoms of physical distress.     Resistance Training   Training Prescription Yes   Weight 2   Reps 10-12      Perform Capillary Blood Glucose checks as needed.  Exercise Prescription Changes:      Exercise Prescription Changes    Row Name 10/13/16 1300 11/08/16 1200           Exercise Review   Progression Yes Yes        Response to Exercise   Blood Pressure (Admit) 133/77 118/70      Blood Pressure (Exercise) 152/82 184/90      Blood Pressure (Exit) 124/64 124/74      Heart Rate (Admit) 81 bpm 67 bpm      Heart Rate (Exercise) 110 bpm 124 bpm      Heart Rate (Exit) 81 bpm 75 bpm      Rating of Perceived Exertion (Exercise) 10 13      Duration Progress to 45 minutes of aerobic exercise without signs/symptoms of physical distress Progress to 45 minutes of aerobic exercise without signs/symptoms of physical distress      Intensity THRR unchanged THRR unchanged        Progression   Progression Continue to progress workloads to maintain intensity without signs/symptoms of physical distress. Continue to progress workloads to maintain intensity without signs/symptoms of physical distress.      Average METs 2.8 3.8        Resistance  Training   Training Prescription Yes Yes      Weight 2 5lb      Reps 10-12 10-12        Bike   Level 1 -      Minutes 10 -      METs 2.75 -        Recumbant Bike   Level  - 4      Minutes  - 10      METs  - 5.3        NuStep   Level 1 3      Minutes 10 10      METs 2.7 3.1        Arm Ergometer   Level  - 2      Watts  - 40      Minutes  - 10      METs  - 2.95        Track   Laps 12 -      Minutes 10 -      METs 3.09 -  Home Exercise Plan   Plans to continue exercise at  - Home      Frequency  - Add 4 additional days to program exercise sessions.         Exercise Comments:      Exercise Comments    Row Name 10/13/16 1401 11/08/16 1224         Exercise Comments Pt is off to a great start and doing well with exercise  Reviewed METs and goals with pt.  Pt is off to a good start with exercise.          Discharge Exercise Prescription (Final Exercise Prescription Changes):     Exercise Prescription Changes - 11/08/16 1200      Exercise Review   Progression Yes     Response to Exercise   Blood Pressure (Admit) 118/70   Blood Pressure (Exercise) 184/90   Blood Pressure (Exit) 124/74   Heart Rate (Admit) 67 bpm   Heart Rate (Exercise) 124 bpm   Heart Rate (Exit) 75 bpm   Rating of Perceived Exertion (Exercise) 13   Duration Progress to 45 minutes of aerobic exercise without signs/symptoms of physical distress   Intensity THRR unchanged     Progression   Progression Continue to progress workloads to maintain intensity without signs/symptoms of physical distress.   Average METs 3.8     Resistance Training   Training Prescription Yes   Weight 5lb   Reps 10-12     Bike   Level --   Minutes --   METs --     Recumbant Bike   Level 4   Minutes 10   METs 5.3     NuStep   Level 3   Minutes 10   METs 3.1     Arm Ergometer   Level 2   Watts 40   Minutes 10   METs 2.95     Track   Laps --   Minutes --   METs --     Home Exercise  Plan   Plans to continue exercise at Home   Frequency Add 4 additional days to program exercise sessions.      Nutrition:  Target Goals: Understanding of nutrition guidelines, daily intake of sodium 1500mg , cholesterol 200mg , calories 30% from fat and 7% or less from saturated fats, daily to have 5 or more servings of fruits and vegetables.  Biometrics:     Pre Biometrics - 09/23/16 1432      Pre Biometrics   % Body Fat 53.8 %       Nutrition Therapy Plan and Nutrition Goals:     Nutrition Therapy & Goals - 09/24/16 1413      Nutrition Therapy   Diet Carb Modified, Therapeutic Lifestyle Changes-     Personal Nutrition Goals   Personal Goal #1 1-2 lb wt loss/week to a wt loss goal of 6-24 lb at graduation from [facility], educate and counsel regarding individualized specific dietary modifications aiming towards targeted core components such as weight, hypertension, lipid management, diabetes, heart failure and other comorbidities.   Expected Outcomes Short Term Goal: Understand basic principles of dietary content, such as calories, fat, sodium, cholesterol and nutrients.;Long Term Goal: Adherence to prescribed nutrition plan.      Nutrition Discharge: Nutrition Scores:   Nutrition Goals Re-Evaluation:   Psychosocial: Target Goals: Acknowledge presence or absence of depression, maximize coping skills, provide positive support system. Participant is able to  verbalize types and ability to use techniques and skills needed for reducing stress and depression.  Initial Review & Psychosocial Screening:     Initial Psych Review & Screening - 09/23/16 [place]? Yes  pt has a very strong support group with family and church staff and freinds.   Comments Brief assessment completed     Barriers   Psychosocial barriers to participate in program There are no identifiable barriers or  psychosocial needs.     Screening Interventions   Interventions Encouraged to exercise      Quality of Life Scores:     Quality of Life - 09/23/16 1149      Quality of Life Scores   Health/Function Pre 26.04 %   Socioeconomic Pre 30 %   Psych/Spiritual Pre 24.86 %   Family Pre 24.17 %   GLOBAL Pre 26.48 %      PHQ-9: Recent Review Flowsheet Data    Depression screen [facility] 2/9 09/27/2016 09/15/2016 07/20/2016 04/26/2016 03/06/2015   Decreased Interest 0 0 0 0 0   Down, Depressed, Hopeless 0 0 0 0 0   PHQ - 2 Score 0 0 0 0 0      Psychosocial Evaluation and Intervention:   Psychosocial Re-Evaluation:     Psychosocial Re-Evaluation    Row Name 10/12/16 1651 11/09/16 1135           Psychosocial Re-Evaluation   Interventions Encouraged to attend Cardiac Rehabilitation for the exercise Encouraged to attend Cardiac Rehabilitation for the exercise      Continued Psychosocial Services Needed No No         Vocational Rehabilitation: Provide vocational rehab assistance to qualifying candidates.   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      Education: Education Goals: Education classes will be provided on a weekly basis, covering required topics. Participant will state understanding/return demonstration of topics presented.  Learning Barriers/Preferences:     Learning Barriers/Preferences - 09/23/16 0917      Learning Barriers/Preferences   Learning Barriers Sight   Learning Preferences Written Material;Skilled Demonstration;Video;Pictoral      Education Topics: Count Your Pulse:  -Group instruction provided by verbal instruction, demonstration, patient participation and written materials to support subject.   Instructors address importance of being able to find your pulse and how to count your pulse when at home without a heart monitor.  Patients get hands on experience counting their pulse with staff help and individually.   Heart Attack, Angina, and Risk Factor Modification:  -Group instruction provided by verbal instruction, video, and written materials to support subject.  Instructors address signs and symptoms of angina and heart attacks.    Also discuss risk factors for heart disease and how to make changes to improve heart health risk factors. Flowsheet Row CARDIAC REHAB PHASE II EXERCISE from 11/05/2016 in [facility]  Date  11/03/16  Instruction Review Code  2- meets goals/outcomes      Functional Fitness:  -Group instruction provided by verbal instruction, demonstration, patient participation, and written materials to support subject.  Instructors address safety measures for doing things around the house.  Discuss how to get up  and down off the floor, how to pick things up properly, how to safely get out of a chair without assistance, and balance training.   Meditation and Mindfulness:  -Group instruction provided by verbal instruction, patient participation, and written materials to support subject.  Instructor addresses importance of mindfulness and meditation practice to help reduce stress and improve awareness.  Instructor also leads participants through a meditation exercise.    Stretching for Flexibility and Mobility:  -Group instruction provided by verbal instruction, patient participation, and written materials to support subject.  Instructors lead participants through series of stretches that are designed to increase flexibility thus improving mobility.  These stretches are additional exercise for major muscle groups that are typically performed during regular warm up and cool down. Flowsheet Row CARDIAC REHAB PHASE II EXERCISE from 11/05/2016 in [place]  Date  11/05/16  Instruction Review Code  2- meets goals/outcomes      Hands Only CPR Anytime:  -Group instruction provided by verbal instruction, video, patient participation and written materials to support subject.  Instructors co-teach with AHA video for hands only CPR.  Participants get hands on experience with mannequins.   Nutrition I class: Heart Healthy Eating:  -Group instruction provided by PowerPoint slides, verbal discussion, and written materials to support subject matter. The instructor gives an explanation and review of the Therapeutic Lifestyle Changes diet recommendations, which includes a discussion on lipid goals, dietary fat, sodium, fiber, plant stanol/sterol esters, sugar, and the components of a well-balanced, healthy diet.   Nutrition II class: Lifestyle Skills:  -Group instruction provided by PowerPoint slides, verbal discussion, and written materials to support subject matter. The instructor gives an explanation and review of label reading, grocery shopping for heart health, heart healthy recipe modifications, and ways to make healthier choices when eating out.   Diabetes Question & Answer:  -Group instruction provided by PowerPoint slides, verbal discussion, and written materials to support subject matter. The instructor gives an explanation and review of diabetes co-morbidities, pre- and post-prandial blood glucose goals, pre-exercise blood glucose goals, signs, symptoms, and treatment of hypoglycemia and hyperglycemia, and foot care basics.   Diabetes Blitz:  -Group instruction provided by PowerPoint slides, verbal discussion, and written materials to support subject matter. The instructor gives an explanation and review of the physiology behind type 1 and type 2 diabetes, diabetes medications and rational behind using different medications, pre- and post-prandial blood glucose recommendations and Hemoglobin A1c goals, diabetes  diet, and exercise including blood glucose guidelines for exercising safely.    Portion Distortion:  -Group instruction provided by PowerPoint slides, verbal discussion, written materials, and food models to support subject matter. The instructor gives an explanation of serving size versus portion size, changes in portions sizes over the last 20 years, and what consists of a serving from each food group.   Stress Management:  -Group instruction provided by verbal instruction, video, and written materials to support subject matter.  Instructors review role of stress in heart disease and how to cope with stress positively.   Flowsheet Row CARDIAC REHAB PHASE II EXERCISE from 11/05/2016 in [place]  Date  10/29/16  Instruction Review Code  2- meets goals/outcomes      Exercising on Your Own:  -Group instruction provided by verbal instruction, power point, and written materials to support subject.  Instructors discuss benefits of exercise, components of exercise, frequency and intensity of exercise, and end points for exercise.  Also discuss use  of nitroglycerin and activating EMS.  Review options of places to exercise outside of rehab.  Review guidelines for sex with heart disease.   Cardiac Drugs I:  -Group instruction provided by verbal instruction and written materials to support subject.  Instructor reviews cardiac drug classes: antiplatelets, anticoagulants, beta blockers, and statins.  Instructor discusses reasons, side effects, and lifestyle considerations for each drug class.   Cardiac Drugs II:  -Group instruction provided by verbal instruction and written materials to support subject.  Instructor reviews cardiac drug classes: angiotensin converting enzyme inhibitors (ACE-I), angiotensin II receptor blockers (ARBs), nitrates, and calcium channel blockers.  Instructor discusses reasons, side effects, and lifestyle considerations for each drug  class.   Anatomy and Physiology of the Circulatory System:  -Group instruction provided by verbal instruction, video, and written materials to support subject.  Reviews functional anatomy of heart, how it relates to various diagnoses, and what role the heart plays in the overall system.   Knowledge Questionnaire Score:     Knowledge Questionnaire Score - 09/23/16 0956      Knowledge Questionnaire Score   Pre Score 21/24      Core Components/Risk Factors/Patient Goals at Admission:     Personal Goals and Risk Factors at Admission - 09/23/16 1019      Core Components/Risk Factors/Patient Goals on Admission   Personal Goal To be motivated to exercise and learn more about nutrition to help with diabetes management   Intervention Provide nutrition counseling and exercise programming to assist with making dietary changes and increasing exercise tolerance/aerobic fitness      Core Components/Risk Factors/Patient Goals Review:      Goals and Risk Factor Review    Row Name 10/13/16 1402 11/08/16 1224           Core Components/Risk Factors/Patient Goals Review   Personal Goals Review Weight Management/Obesity;Increase Strength and Stamina Weight Management/Obesity;Increase Strength and Stamina      Review Pt states she has not lost weight but she feels like her energy level has already improved.  she wants to focus on building back her upper body strength.  Physically, pt states she feels awesome!  She has less SOB with exertion and more energy.  She is able to run around with her nieces and nephews w/o CP.      Expected Outcomes Continue with routine exercicse program in order to increase strength, stamina and acheive weight loss goal Continue with routine exercicse program in order to increase strength, stamina and acheive weight loss goal         Core Components/Risk Factors/Patient Goals at Discharge (Final Review):      Goals and Risk Factor Review - 11/08/16 1224       Core Components/Risk Factors/Patient Goals Review   Personal Goals Review Weight Management/Obesity;Increase Strength and Stamina   Review Physically, pt states she feels awesome!  She has less SOB with exertion and more energy.  She is able to run around with her nieces and nephews w/o CP.   Expected Outcomes Continue with routine exercicse program in order to increase strength, stamina and acheive weight loss goal      ITP Comments:     ITP Comments    Row Name 09/23/16 0914           ITP Comments Dr. Fransico Him Medical Director          Comments: Tye Maryland is making expected progress toward personal goals after completing 15 sessions. Recommend continued exercise and life style modification  education including  stress management and relaxation techniques to decrease cardiac risk profile. Tye Maryland returns to work on Thursday of this week. Will continue to monitor blood pressures.Barnet Pall, RN,BSN 11/10/2016 5:50 PM

## 2016-11-10 ENCOUNTER — Encounter (HOSPITAL_COMMUNITY)
Admission: RE | Admit: 2016-11-10 | Discharge: 2016-11-10 | Disposition: A | Discharge status: Home / Self Care | Payer: BLUE CROSS/BLUE SHIELD | Source: Ambulatory Visit | Attending: Cardiology | Admitting: Cardiology

## 2016-11-10 DIAGNOSIS — I213 ST elevation (STEMI) myocardial infarction of unspecified site: Secondary | ICD-10-CM

## 2016-11-10 DIAGNOSIS — Z951 Presence of aortocoronary bypass graft: Secondary | ICD-10-CM

## 2016-11-11 ENCOUNTER — Encounter: Payer: BLUE CROSS/BLUE SHIELD | Admitting: Physician Assistant

## 2016-11-12 ENCOUNTER — Encounter (HOSPITAL_COMMUNITY)
Admission: RE | Admit: 2016-11-12 | Discharge: 2016-11-12 | Disposition: A | Discharge status: Home / Self Care | Payer: BLUE CROSS/BLUE SHIELD | Source: Ambulatory Visit | Attending: Cardiology | Admitting: Cardiology

## 2016-11-12 DIAGNOSIS — Z951 Presence of aortocoronary bypass graft: Secondary | ICD-10-CM

## 2016-11-12 DIAGNOSIS — I213 ST elevation (STEMI) myocardial infarction of unspecified site: Secondary | ICD-10-CM

## 2016-11-12 DIAGNOSIS — I2121 ST elevation (STEMI) myocardial infarction involving left circumflex coronary artery: Secondary | ICD-10-CM | POA: Insufficient documentation

## 2016-11-15 ENCOUNTER — Encounter (HOSPITAL_COMMUNITY)
Admission: RE | Admit: 2016-11-15 | Discharge: 2016-11-15 | Disposition: A | Discharge status: Home / Self Care | Payer: BLUE CROSS/BLUE SHIELD | Source: Ambulatory Visit | Attending: Cardiology | Admitting: Cardiology

## 2016-11-15 DIAGNOSIS — Z951 Presence of aortocoronary bypass graft: Secondary | ICD-10-CM

## 2016-11-15 DIAGNOSIS — I213 ST elevation (STEMI) myocardial infarction of unspecified site: Secondary | ICD-10-CM

## 2016-11-15 NOTE — Progress Notes (Signed)
[name] [name] 61 y.o. female Nutrition Note Spoke with pt. Nutrition Plan and Nutrition Survey goals reviewed with pt. Pt is following Step 1 of the Therapeutic Lifestyle Changes diet. Pt wants to lose wt. Pt and her family are reportedly going to start Weight Watcher's together. Pt has not yet started Weight Watchers and has not been actively trying to lose wt until now. Wt loss tips reviewed. Pt is diabetic. Last A1c indicates blood glucose not optimally controlled. This Probation officer went over Diabetes Education test results. Pt checks CBG's once daily since starting rehab. Fasting CBG's reportedly ~120 mg/dL. Pt with dx of CHF. Per discussion, pt does not use canned/convenience foods often. Pt expressed understanding of the information reviewed. Pt aware of nutrition education classes offered.  Lab Results  Component Value Date   HGBA1C 7.1 (H) 07/20/2016   Wt Readings from Last 3 Encounters:  10/20/16 239 lb (108.4 kg)  09/23/16 237 lb 3.4 oz (107.6 kg)  09/15/16 233 lb 6.4 oz (105.9 kg)    Nutrition Diagnosis ? Food-and nutrition-related knowledge deficit related to lack of exposure to information as related to diagnosis of: ? CVD ? DM ? Obesity related to excessive energy intake as evidenced by a BMI of 41.8  Nutrition Intervention ? Pt's individual nutrition plan reviewed with pt. ? Benefits of adopting Therapeutic Lifestyle Changes discussed when Medficts reviewed. ? Pt to attend the Portion Distortion class ? Pt to attend the Diabetes Q & A class ? Pt to attend the   ? Nutrition I class                  ? Nutrition II class     ? Diabetes Blitz class ? Pt given handouts for: ? Nutrition I class ? Nutrition II class ? Diabetes Blitz Class ? Continue client-centered nutrition education by RD, as part of interdisciplinary care. Goal(s) ? Pt to identify and limit food sources of saturated fat, trans fat, and sodium ? Pt to identify food quantities necessary to achieve weight loss of  6-24 lb (2.7-10.9 kg) at graduation from cardiac rehab.  ? CBG concentrations in the normal range or as close to normal as is safely possible. Monitor and Evaluate progress toward nutrition goal with team. Derek Mound, M.Ed, RD, LDN, CDE 11/15/2016 11:28 AM

## 2016-11-15 NOTE — Progress Notes (Signed)
Incomplete Session Note  Patient Details  Name: [name] [name] MRN: [id number] Date of Birth: [birth year] Referring Provider:   Flowsheet Row CARDIAC REHAB PHASE II ORIENTATION from 09/23/2016 in [place]  Referring Provider  Martinique, Peter MD      [name] [name] did not complete her rehab session.  Exertional blood pressure noted at 180/84 on the scifit. Patient instructed to decrease workload. Recheck blood pressure 220/80.  Exercise stopped. Recheck blood pressure 164/80 then 144/64. [name] Standard PA called and notified about today's elevated blood pressure. [name] [name] will make an appointment for [facility] for office follow up in the next few weeks. [name] [name] said [name] [name] is okay to return to exercise on Wednesday. Patient also instructed to keep a log of her blood pressures at home.Will fax exercise flow sheets to Dr. Olin Pia office for review. Exit blood pressure 122/60 Barnet Pall, RN,BSN 11/15/2016 12:12 PM

## 2016-11-17 ENCOUNTER — Encounter (HOSPITAL_COMMUNITY)
Admission: RE | Admit: 2016-11-17 | Discharge: 2016-11-17 | Disposition: A | Discharge status: Home / Self Care | Payer: BLUE CROSS/BLUE SHIELD | Source: Ambulatory Visit | Attending: Cardiology | Admitting: Cardiology

## 2016-11-17 ENCOUNTER — Other Ambulatory Visit: Payer: Self-pay | Admitting: Physician Assistant

## 2016-11-17 DIAGNOSIS — I213 ST elevation (STEMI) myocardial infarction of unspecified site: Secondary | ICD-10-CM

## 2016-11-17 DIAGNOSIS — Z951 Presence of aortocoronary bypass graft: Secondary | ICD-10-CM

## 2016-11-19 ENCOUNTER — Ambulatory Visit (INDEPENDENT_AMBULATORY_CARE_PROVIDER_SITE_OTHER): Payer: Self-pay | Admitting: Physician Assistant

## 2016-11-19 ENCOUNTER — Encounter (HOSPITAL_COMMUNITY)
Admission: RE | Admit: 2016-11-19 | Discharge: 2016-11-19 | Disposition: A | Discharge status: Home / Self Care | Payer: BLUE CROSS/BLUE SHIELD | Source: Ambulatory Visit | Attending: Cardiology | Admitting: Cardiology

## 2016-11-19 VITALS — BP 140/82 | HR 70 | Temp 98.0°F | Ht 62.0 in | Wt 241.0 lb

## 2016-11-19 DIAGNOSIS — Z951 Presence of aortocoronary bypass graft: Secondary | ICD-10-CM

## 2016-11-19 DIAGNOSIS — Z79899 Other long term (current) drug therapy: Secondary | ICD-10-CM

## 2016-11-19 DIAGNOSIS — I252 Old myocardial infarction: Secondary | ICD-10-CM

## 2016-11-19 DIAGNOSIS — IMO0002 Reserved for concepts with insufficient information to code with codable children: Secondary | ICD-10-CM

## 2016-11-19 DIAGNOSIS — I10 Essential (primary) hypertension: Secondary | ICD-10-CM

## 2016-11-19 DIAGNOSIS — I213 ST elevation (STEMI) myocardial infarction of unspecified site: Secondary | ICD-10-CM

## 2016-11-19 DIAGNOSIS — E118 Type 2 diabetes mellitus with unspecified complications: Secondary | ICD-10-CM

## 2016-11-19 DIAGNOSIS — Z794 Long term (current) use of insulin: Secondary | ICD-10-CM

## 2016-11-19 DIAGNOSIS — E1165 Type 2 diabetes mellitus with hyperglycemia: Secondary | ICD-10-CM

## 2016-11-19 MED ORDER — AMIODARONE HCL 200 MG PO TABS: 200.0000 mg | ORAL_TABLET | Freq: Every day | ORAL | 2 refills | Status: DC

## 2016-11-19 MED ORDER — GLIMEPIRIDE 4 MG PO TABS: 2.0000 mg | ORAL_TABLET | Freq: Two times a day (BID) | ORAL | 1 refills | Status: DC

## 2016-11-19 NOTE — Progress Notes (Signed)
[name] [name]  MRN: [id number] DOB: 08/30/1956  PCP: Pcp Not In System  Subjective:  Pt is a 61 year old female PMH diabetes, CAD, SA node dysfunction with pacemaker in place, OSA, HTN, HLD, GERD who presents to clinic for mediation refill.   H/o HTN, STEMI and coronary artery bypass surgery 07/2016, surgery went well, no complications.  Needs refill of Amiodarone 200mg  qd today. Also taking aspirin 200mg , Lipitor 80mg , Lisinopril 5mg , Toprol 50mg . She is in cardiac rehab right now and loving it, doing well. She is feeling well today, no complaints. Denies chest pain, chest tightness, SOB, palpitations, headache, dizziness.   H/o Diabetes - Glimepiride 4mg  She was d/c'd from Metformin several years ago due to elevated LFT's.  she is UTD on health maintenance, except urine microalbumin.  Last eye exam 12/2015 Last A1C 07/2016 - 7.1% Last foot exam 04/2016 Last urine microalbumin 09/2014: 2.9 - She cannot provide urine sample today "I went right before I came" She would like to provide sample next month at her annual exam.   Review of Systems  Constitutional: Negative for chills, diaphoresis, fatigue and fever.  Respiratory: Negative for cough, chest tightness, shortness of breath and wheezing.   Cardiovascular: Negative for chest pain, palpitations and leg swelling.  Gastrointestinal: Negative for abdominal pain, diarrhea, nausea and vomiting.  Endocrine: Negative for polydipsia, polyphagia and polyuria.  Neurological: Negative for weakness, light-headedness, numbness and headaches.    Patient Active Problem List   Diagnosis Date Noted  . S/P CABG x 3 07/22/2016  . Multiple vessel coronary artery disease 07/21/2016  . STEMI involving left circumflex coronary artery ([place]) 07/20/2016  . Asthmatic bronchitis 04/26/2016  . Allergic urticaria 04/26/2016  . Environmental allergies 04/26/2016  . Neck pain 04/26/2016  . Anxiety 04/26/2016  . Sinoatrial node dysfunction (HCC) 04/26/2016    . Pacemaker Medtronic   . Ventricular tachycardia, nonsustained-during exercise testing 03/07/2013  . OSA (obstructive sleep apnea) 05/25/2011  . Hx of adenomatous colonic polyps 05/25/2011  . Migraine 05/25/2011  . Diabetes mellitus type 2, uncontrolled, with complications ([place]) 0000000  . GERD (gastroesophageal reflux disease) 05/25/2011  . NAFLD (nonalcoholic fatty liver disease) 05/25/2011  . Hyperlipidemia 05/25/2011  . Severe essential hypertension 09/16/2009  . DIASTOLIC HEART FAILURE, CHRONIC 09/16/2009    Current Outpatient Prescriptions on File Prior to Visit  Medication Sig Dispense Refill  . albuterol (PROVENTIL HFA;VENTOLIN HFA) 108 (90 Base) MCG/ACT inhaler Inhale into the lungs every 6 (six) hours as needed for wheezing or shortness of breath.    Marland Kitchen aspirin EC 325 MG EC tablet Take 1 tablet (325 mg total) by mouth daily. 30 tablet 0  . atorvastatin (LIPITOR) 80 MG tablet Take 1 tablet (80 mg total) by mouth daily at 6 PM. 30 tablet 3  . cetirizine (ZYRTEC) 10 MG tablet Take 10 mg by mouth daily.      . Cholecalciferol (VITAMIN D) 2000 UNITS CAPS Take 1 capsule by mouth daily as needed (vitamin supplement).     . citalopram (CELEXA) 20 MG tablet Take 1 tablet by mouth daily    . dicyclomine (BENTYL) 20 MG tablet Take 20 mg by mouth 3 (three) times daily.    . fish oil-omega-3 fatty acids 1000 MG capsule Take 2 g by mouth daily.     . fluticasone (FLONASE) 50 MCG/ACT nasal spray Place 2 sprays into both nostrils daily. 48 g 3  . fluticasone (FLOVENT HFA) 110 MCG/ACT inhaler Inhale 2 puffs into the lungs 2 (two) times  daily. 1 Inhaler 12  . gabapentin (NEURONTIN) 300 MG capsule Take 1 tablet by mouth in the AM and 2 tablets by mouth in the PM    . glucose blood test strip Use as instructed 200 each 5  . hydrOXYzine (ATARAX/VISTARIL) 25 MG tablet Take 25 mg by mouth every 8 (eight) hours as needed for anxiety.    Marland Kitchen lisinopril (PRINIVIL,ZESTRIL) 5 MG tablet Take 1 tablet (5  mg total) by mouth daily. 30 tablet 9  . metoprolol succinate (TOPROL-XL) 50 MG 24 hr tablet Take 1 tablet (50 mg total) by mouth daily. Take with or immediately following a meal. 30 tablet 1  . Multiple Vitamin (MULTIVITAMIN) tablet Take 1 tablet by mouth daily.    Marland Kitchen omeprazole (PRILOSEC) 20 MG capsule Take 1 capsule by mouth daily    . ondansetron (ZOFRAN-ODT) 8 MG disintegrating tablet DISSOLVE 1 ON TONGUE EVERY 12 HOURS AS NEEDED FOR NAUSEA 30 tablet 3  . traMADol (ULTRAM) 50 MG tablet Take one or two by mouth every 4-6 hours PRN moderate pain 10 tablet 0  . triamcinolone cream (KENALOG) 0.1 % Apply 1 application topically as needed. 30 g 2   No current facility-administered medications on file prior to visit.     Allergies  Allergen Reactions  . Adhesive [Tape] Other (See Comments)    SKIN BLISTERS  . Penicillins     Swelling   . Red Yeast Rice Hives  . Sulfa Drugs Cross Reactors Hives     Objective:  BP 140/82 (BP Location: Right Arm, Patient Position: Sitting, Cuff Size: Large)   Pulse 70   Temp 98 F (36.7 C) (Oral)   Ht 5\' 2"  (1.575 m)   Wt 241 lb (109.3 kg)   SpO2 93%   BMI 44.08 kg/m   Physical Exam  Constitutional: She is oriented to person, place, and time and well-developed, well-nourished, and in no distress. No distress.  Cardiovascular: Normal rate, regular rhythm and normal heart sounds.   Pulmonary/Chest: Effort normal. No respiratory distress.  Neurological: She is alert and oriented to person, place, and time. GCS score is 15.  Skin: Skin is warm and dry.  Psychiatric: Mood, memory, affect and judgment normal.  Vitals reviewed.   Assessment and Plan :  1. Encounter for medication management 2. Uncontrolled type 2 diabetes mellitus with complication, with long-term current use of insulin (HCC) - glimepiride (AMARYL) 4 MG tablet; Take 0.5 tablets (2 mg total) by mouth 2 (two) times daily. Take 0.5 tablet by mouth twice daily with meals  Dispense: 30  tablet; Refill: 1  3. Essential hypertension 4. History of ST elevation myocardial infarction (STEMI) 5. S/P CABG x 3 - amiodarone (PACERONE) 200 MG tablet; Take 1 tablet (200 mg total) by mouth daily.  Dispense: 30 tablet; Refill: 2 - Pt doing well today. She is s/p CABG, currently attending cardiac rehab and loves it. OK to refill these medications x 2 months, as she has annual exam next month. She is due for A1C, urine microalbumin at that time.   Mercer Pod, PA-C  [facility] 11/19/2016 3:03 PM

## 2016-11-19 NOTE — Patient Instructions (Signed)
     IF you received an x-ray today, you will receive an invoice from [facility]. Please contact [facility] at [phone] with questions or concerns regarding your invoice.   IF you received labwork today, you will receive an invoice from [facility]. Please contact [facility] at [phone] with questions or concerns regarding your invoice.   Our billing staff will not be able to assist you with questions regarding bills from these companies.  You will be contacted with the lab results as soon as they are available. The fastest way to get your results is to activate your My Chart account. Instructions are located on the last page of this paperwork. If you have not heard from us regarding the results in 2 weeks, please contact this office.     

## 2016-11-21 NOTE — Progress Notes (Signed)
Cardiology Office Note Date:  11/22/2016  Patient ID:  [name], [name] July 27, 1956, MRN [id number] PCP:  Ivar Drape, PA  Cardiologist:  Dr. Caryl Comes    Chief Complaint: f/u on BP  History of Present Illness: [name] [name] is a 61 y.o. female with history of sinus node dysfunction w/PPM, CAD s/p CABG Oct 12,2017, post-op AFib tx with amiodarone, OSA w/CPAP and reported compliance, HTN, HLD, HFpEF.  The patient was observed to have elevated BP readings at peak exercise during cardiac rehab, quickly recovering with rest.  She comes to be seen for Dr. Caryl Comes to follow up on her BP.  She was last seen by him late October, at that time doing well no AF had been observed and her amio d/c'd, though appears back on her list and she is taking it.  She is feeling very well, no CP, palpitations or SOB, no dizziness, near syncope or syncope.  She reports generally her home BP about 140 or so, inttermittently will have headaches or feel her heart beat in her ears/eys and will check and will be in the 160's.     Device information: MDT dual chamber PPM, implanted 09/10/08, Dr. Lovena Le, sick sinus syndrome   Past Medical History:  Diagnosis Date  . Allergy   . Anal fissure   . Anxiety   . Arthritis   . Asthma   . Cataract 2010    left  . Colon polyps    diverticulosis  03-2011/2005  . Depression   . Diabetes mellitus 2012   T2DM  . Diverticulosis   . Fatty liver 04/2011  . GERD (gastroesophageal reflux disease)   . Hyperlipidemia   . Hypertension severe   . Migraine   . Obesity   . OSA (obstructive sleep apnea)    C-Pap  . Pacemaker Medtronic   . Sinoatrial node dysfunction (HCC)    syncope    Past Surgical History:  Procedure Laterality Date  . ABDOMINAL HYSTERECTOMY  2000   total  . CARDIAC CATHETERIZATION N/A 07/20/2016   Procedure: Left Heart Cath and Coronary Angiography;  Surgeon: Peter M Martinique, MD;  Location: [facility];  Service: Cardiovascular;  Laterality:  N/A;  . CORONARY ARTERY BYPASS GRAFT N/A 07/22/2016   Procedure: CORONARY ARTERY BYPASS GRAFTING (CABG) x three , using left internal mammary artery and right leg greater saphenous vein harvested endoscopically;  Surgeon: Ivin Poot, MD;  Location: [facility];  Service: Open Heart Surgery;  Laterality: N/A;  . EYE SURGERY Right 2010  . OOPHORECTOMY    . PACEMAKER PLACEMENT    . T/A right knee    . TEE WITHOUT CARDIOVERSION N/A 07/22/2016   Procedure: TRANSESOPHAGEAL ECHOCARDIOGRAM (TEE);  Surgeon: Ivin Poot, MD;  Location: [facility];  Service: Open Heart Surgery;  Laterality: N/A;  . TONSILLECTOMY      Current Outpatient Prescriptions  Medication Sig Dispense Refill  . albuterol (PROVENTIL HFA;VENTOLIN HFA) 108 (90 Base) MCG/ACT inhaler Inhale into the lungs every 6 (six) hours as needed for wheezing or shortness of breath.    Marland Kitchen aspirin EC 325 MG EC tablet Take 1 tablet (325 mg total) by mouth daily. 30 tablet 0  . atorvastatin (LIPITOR) 80 MG tablet Take 1 tablet (80 mg total) by mouth daily at 6 PM. 30 tablet 3  . cetirizine (ZYRTEC) 10 MG tablet Take 10 mg by mouth daily.      . citalopram (CELEXA) 20 MG tablet Take 1 tablet by mouth daily    .  dicyclomine (BENTYL) 20 MG tablet Take 20 mg by mouth 3 (three) times daily.    . fish oil-omega-3 fatty acids 1000 MG capsule Take 2 g by mouth daily.     . fluticasone (FLONASE) 50 MCG/ACT nasal spray Place 2 sprays into both nostrils daily. 48 g 3  . fluticasone (FLOVENT HFA) 110 MCG/ACT inhaler Inhale 2 puffs into the lungs 2 (two) times daily. 1 Inhaler 12  . gabapentin (NEURONTIN) 300 MG capsule Take 1 tablet by mouth in the AM and 2 tablets by mouth in the PM    . glimepiride (AMARYL) 4 MG tablet Take 0.5 tablets (2 mg total) by mouth 2 (two) times daily. Take 0.5 tablet by mouth twice daily with meals 30 tablet 1  . glucose blood test strip Use as instructed 200 each 5  . hydrOXYzine (ATARAX/VISTARIL) 25 MG tablet Take 25 mg by mouth  every 8 (eight) hours as needed for anxiety.    Marland Kitchen lisinopril (PRINIVIL,ZESTRIL) 10 MG tablet Take 1 tablet (10 mg total) by mouth daily. 30 tablet 6  . metoprolol succinate (TOPROL-XL) 50 MG 24 hr tablet Take 1 tablet (50 mg total) by mouth daily. Take with or immediately following a meal. 30 tablet 1  . Multiple Vitamin (MULTIVITAMIN) tablet Take 1 tablet by mouth daily.    Marland Kitchen omeprazole (PRILOSEC) 20 MG capsule Take 1 capsule by mouth daily    . ondansetron (ZOFRAN-ODT) 8 MG disintegrating tablet DISSOLVE 1 ON TONGUE EVERY 12 HOURS AS NEEDED FOR NAUSEA 30 tablet 3  . traMADol (ULTRAM) 50 MG tablet Take one or two by mouth every 4-6 hours PRN moderate pain 10 tablet 0  . triamcinolone cream (KENALOG) 0.1 % Apply 1 application topically as needed. 30 g 2   No current facility-administered medications for this visit.     Allergies:   Adhesive [tape]; Penicillins; Red yeast rice; and Sulfa drugs cross reactors   Social History:  The patient  reports that she quit smoking about 25 years ago. Her smoking use included Cigarettes. She has never used smokeless tobacco. She reports that she does not drink alcohol or use drugs.   Family History:  The patient's family history includes Aneurysm in her paternal grandfather; Breast cancer in her brother and mother; Cancer in her maternal grandmother; Cancer (age of onset: 22) in her mother; Diabetes in her sister; Heart disease in her father and mother; Leukemia in her paternal grandmother.  ROS:  Please see the history of present illness.  All other systems are reviewed and otherwise negative.   PHYSICAL EXAM:  VS:  BP 134/88   Pulse 65   Ht 5\' 2"  (1.575 m)   Wt 243 lb (110.2 kg)   BMI 44.45 kg/m  BMI: Body mass index is 44.45 kg/m. Well nourished, well developed, in no acute distress  HEENT: normocephalic, atraumatic  Neck: no JVD, carotid bruits or masses Cardiac:  RRR; no significant murmurs, no rubs, or gallops Lungs:  CTA b/l, no wheezing,  rhonchi or rales  Abd: soft, nontender MS: no deformity or atrophy Ext: no edema  Skin: warm and dry, no rash Neuro:  No gross deficits appreciated Psych: euthymic mood, full affect   EKG:  Done 08/09/16 is SR PPM interrogation done today and reviewed by myself: stable battery and lead measurements, 0.5% V paced, no AF, one rate drop tx 07/21/16: TTE Study Conclusions - Left ventricle: The cavity size was normal. Wall thickness was   normal. Systolic function was normal. The  estimated ejection   fraction was in the range of 60% to 65%. Wall motion was normal;   there were no regional wall motion abnormalities. Doppler   parameters are consistent with abnormal left ventricular   relaxation (grade 1 diastolic dysfunction). - Left atrium: The atrium was mildly dilated. Anterior-posterior   dimension: 44 mm. - Right ventricle: Pacer wire or catheter noted in right ventricle. Impressions: - Compared to the prior study, there has been no significant   interval change.   Recent Labs: 07/20/2016: B Natriuretic Peptide 88.8 07/23/2016: Magnesium 2.0 09/15/2016: ALT 23; BUN 16; Creatinine, Ser 0.92; Hemoglobin 12.9; Platelets WILL FOLLOW; Potassium 4.2; Sodium 143  07/21/2016: Cholesterol 254; HDL 39; LDL Cholesterol 164; Total CHOL/HDL Ratio 6.5; Triglycerides 255; VLDL 51   CrCl cannot be calculated (Patient's most recent lab result is older than the maximum 21 days allowed.).   Wt Readings from Last 3 Encounters:  11/22/16 243 lb (110.2 kg)  11/19/16 241 lb (109.3 kg)  10/20/16 239 lb (108.4 kg)     Other studies reviewed: Additional studies/records reviewed today include: summarized above  ASSESSMENT AND PLAN:  1. CAD  S/p NSTEMI, CABG Oct 2017, (left internal mammary artery to LAD, saphenous vein graft to diagonal, saphenous vein graft to obtuse marginal).     no CP     On ASA, statin, BB  2. HTN     Fair control with hypertensive response to exercise observed at  cardiac rehab     Increase her lisinopril to 10mg  daily  3. Sinus node dysfunction w/PPM      Normal device function, no changes  4. HLD      On high dose statin     Reports monitored with her PMD  5. Post op, (CABG) PAF     None on her pacer check     CHA2DS2Vasc is 3-4     Never started on a/c     Her amio stopped by Dr. Caryl Comes at her last visit with him, appears was resumed when her medicines were refilled by PMD service     Stop amiodarone, monitor for AF via her pacer, if she has will need to discuss a/c  Disposition: F/u with cardiac rehab until completed, and ROV in 3 months, sooner if needed.  Current medicines are reviewed at length with the patient today.  The patient did not have any concerns regarding medicines.  Haywood Lasso, PA-C 11/22/2016 12:33 PM     [address] [place] [address] [phone] (office)  [phone] (fax)

## 2016-11-22 ENCOUNTER — Encounter (HOSPITAL_COMMUNITY)
Admission: RE | Admit: 2016-11-22 | Discharge: 2016-11-22 | Disposition: A | Discharge status: Home / Self Care | Payer: Self-pay | Source: Ambulatory Visit | Attending: Cardiology | Admitting: Cardiology

## 2016-11-22 ENCOUNTER — Ambulatory Visit (INDEPENDENT_AMBULATORY_CARE_PROVIDER_SITE_OTHER): Payer: Self-pay | Admitting: Physician Assistant

## 2016-11-22 VITALS — BP 134/88 | HR 65 | Ht 62.0 in | Wt 243.0 lb

## 2016-11-22 DIAGNOSIS — Z951 Presence of aortocoronary bypass graft: Secondary | ICD-10-CM

## 2016-11-22 DIAGNOSIS — I5032 Chronic diastolic (congestive) heart failure: Secondary | ICD-10-CM

## 2016-11-22 DIAGNOSIS — I10 Essential (primary) hypertension: Secondary | ICD-10-CM

## 2016-11-22 DIAGNOSIS — I2510 Atherosclerotic heart disease of native coronary artery without angina pectoris: Secondary | ICD-10-CM

## 2016-11-22 DIAGNOSIS — I480 Paroxysmal atrial fibrillation: Secondary | ICD-10-CM

## 2016-11-22 DIAGNOSIS — I213 ST elevation (STEMI) myocardial infarction of unspecified site: Secondary | ICD-10-CM

## 2016-11-22 DIAGNOSIS — Z950 Presence of cardiac pacemaker: Secondary | ICD-10-CM

## 2016-11-22 MED ORDER — LISINOPRIL 10 MG PO TABS: 10.0000 mg | ORAL_TABLET | Freq: Every day | ORAL | 6 refills | Status: DC

## 2016-11-22 NOTE — Patient Instructions (Addendum)
Medication Instructions:   STOP TAKING AMIODARONE 200 MG  START TAKING LISINOPRIL  10  MG ONCE A DAY   If you need a refill on your cardiac medications before your next appointment, please call your pharmacy.  Labwork: NONE ORDERED  TODAY    Testing/Procedures: NONE ORDERED  TODAY    Follow-Up: WITH RENEE URSUY OR [facility] IN 3 MONTHS    Any Other Special Instructions Will Be Listed Below (If Applicable).

## 2016-11-24 ENCOUNTER — Encounter (HOSPITAL_COMMUNITY)
Admission: RE | Admit: 2016-11-24 | Discharge: 2016-11-24 | Disposition: A | Discharge status: Home / Self Care | Payer: Self-pay | Source: Ambulatory Visit | Attending: Cardiology | Admitting: Cardiology

## 2016-11-24 VITALS — Wt 244.7 lb

## 2016-11-24 DIAGNOSIS — Z951 Presence of aortocoronary bypass graft: Secondary | ICD-10-CM

## 2016-11-24 DIAGNOSIS — I213 ST elevation (STEMI) myocardial infarction of unspecified site: Secondary | ICD-10-CM

## 2016-11-24 NOTE — Progress Notes (Signed)
Discharge Summary  Patient Details  Name: [name] [name] MRN: [id number] Date of Birth: Jan 20, 1956 Referring Provider:   Flowsheet Row CARDIAC REHAB PHASE II ORIENTATION from 09/23/2016 in [place]  Referring Provider  Martinique, Peter MD       Number of Visits: 22  Reason for Discharge:  Early Exit:  Insurance  Smoking History:  History  Smoking Status  . Former Smoker  . Types: Cigarettes  . Quit date: 10/12/1991  Smokeless Tobacco  . Never Used    Comment: Quit 20 yrs ago    Diagnosis:  07/22/16 S/P CABG x 3  07/20/16 ST elevation myocardial infarction (STEMI), unspecified artery ([place])  ADL UCSD:   Initial Exercise Prescription:     Initial Exercise Prescription - 09/23/16 1400      Date of Initial Exercise RX and Referring Provider   Date 09/23/16   Referring Provider Martinique, Peter MD     Bike   Level 1   Minutes 10   METs 2.75     NuStep   Level 1   Minutes 10   METs 2     Track   Laps 10   Minutes 10   METs 2.74     Prescription Details   Frequency (times per week) 3   Duration Progress to 45 minutes of aerobic exercise without signs/symptoms of physical distress     Intensity   THRR 40-80% of Max Heartrate 63-126   Ratings of Perceived Exertion 11-15   Perceived Dyspnea 0-4     Progression   Progression Continue to progress workloads to maintain intensity without signs/symptoms of physical distress.     Resistance Training   Training Prescription Yes   Weight 2   Reps 10-12      Discharge Exercise Prescription (Final Exercise Prescription Changes):     Exercise Prescription Changes - 12/02/16 1500      Exercise Review   Progression Yes     Response to Exercise   Blood Pressure (Admit) 148/94   Blood Pressure (Exercise) 168/82   Blood Pressure (Exit) 130/84   Heart Rate (Admit) 65 bpm   Heart Rate (Exercise) 97 bpm   Heart Rate (Exit) 65 bpm   Rating of Perceived Exertion (Exercise) 11   Duration Progress to 45 minutes of aerobic exercise without signs/symptoms of physical distress   Intensity THRR unchanged     Progression   Progression Continue to progress workloads to maintain intensity without signs/symptoms of physical distress.   Average METs 3.8     Resistance Training   Training Prescription Yes   Weight 5lb   Reps 10-12     Recumbant Bike   Level 4   Minutes 10   METs 4.3     NuStep   Level 4   Minutes 10   METs 4     Arm Ergometer   Level 2   Watts 40   Minutes 10   METs 3     Home Exercise Plan   Plans to continue exercise at Home   Frequency Add 4 additional days to program exercise sessions.      Functional Capacity:     6 Minute Walk    Row Name 09/23/16 0957 09/23/16 1431 12/02/16 1540     6 Minute Walk   Phase Initial  - Discharge   Distance 1508 feet  - 1853 feet   Distance % Change  -  - 22.9 %   Walk Time 6  minutes  - 6 minutes   # of Rest Breaks 0  -  -   MPH  - 2.6 3.5   METS  - 2.9 3.7   RPE 11  - 11   VO2 Peak  - 10.3 12.8   Symptoms No  - No   Resting HR 68 bpm  - 65 bpm   Resting BP 138/84  - 124/80   Max Ex. HR 83 bpm  - 97 bpm   Max Ex. BP 164/90  - 168/82   2 Minute Post BP  - 144/86 130/84      Psychological, QOL, Others - Outcomes: PHQ 2/9: Depression screen [facility] 2/9 11/19/2016 09/27/2016 09/15/2016 07/20/2016 04/26/2016  Decreased Interest 0 0 0 0 0  Down, Depressed, Hopeless 0 0 0 0 0  PHQ - 2 Score 0 0 0 0 0    Quality of Life:     Quality of Life - 09/23/16 1149      Quality of Life Scores   Health/Function Pre 26.04 %   Socioeconomic Pre 30 %   Psych/Spiritual Pre 24.86 %   Family Pre 24.17 %   GLOBAL Pre 26.48 %      Personal Goals: Goals established at orientation with interventions provided to work toward goal.     Personal Goals and Risk Factors at Admission - 09/23/16 1019      Core Components/Risk Factors/Patient Goals on Admission   Personal Goal To be motivated to exercise  and learn more about nutrition to help with diabetes management   Intervention Provide nutrition counseling and exercise programming to assist with making dietary changes and increasing exercise tolerance/aerobic fitness       Personal Goals Discharge:     Goals and Risk Factor Review    Row Name 10/13/16 1402 11/08/16 1224 12/02/16 1608         Core Components/Risk Factors/Patient Goals Review   Personal Goals Review Weight Management/Obesity;Increase Strength and Stamina Weight Management/Obesity;Increase Strength and Stamina Weight Management/Obesity     Review Pt states she has not lost weight but she feels like her energy level has already improved.  she wants to focus on building back her upper body strength.  Physically, pt states she feels awesome!  She has less SOB with exertion and more energy.  She is able to run around with her nieces and nephews w/o CP. Pt has gained 7.5 lb since admission.      Expected Outcomes Continue with routine exercicse program in order to increase strength, stamina and acheive weight loss goal Continue with routine exercicse program in order to increase strength, stamina and acheive weight loss goal Pt to continue to work toward Step 2 of the Therapeutic Lifestyle Changes diet. Pt to continue lifestyle changes to promote desired wt loss.         Nutrition & Weight - Outcomes:     Pre Biometrics - 09/23/16 1432      Pre Biometrics   % Body Fat 53.8 %         Post Biometrics - 12/02/16 1547       Post  Biometrics   Weight 244 lb 11.4 oz (111 kg)   Waist Circumference 47.5 inches   Hip Circumference 51.5 inches   Waist to Hip Ratio 0.92 %   Triceps Skinfold 44 mm   % Body Fat 54 %   Grip Strength 38 kg   Flexibility 14 in   Single Leg Stand 30 seconds  Nutrition:     Nutrition Therapy & Goals - 09/24/16 1413      Nutrition Therapy   Diet Carb Modified, Therapeutic Lifestyle Changes-     Personal Nutrition Goals   Personal  Goal #1 1-2 lb wt loss/week to a wt loss goal of 6-24 lb at graduation from [place], educate and counsel regarding individualized specific dietary modifications aiming towards targeted core components such as weight, hypertension, lipid management, diabetes, heart failure and other comorbidities.   Expected Outcomes Short Term Goal: Understand basic principles of dietary content, such as calories, fat, sodium, cholesterol and nutrients.;Long Term Goal: Adherence to prescribed nutrition plan.      Nutrition Discharge:     Nutrition Assessments - 11/15/16 1128      MEDFICTS Scores   Pre Score 53      Education Questionnaire Score:     Knowledge Questionnaire Score - 09/23/16 0956      Knowledge Questionnaire Score   Pre Score 21/24      Goals reviewed with patient; copy given to patient. Tye Maryland graduated from cardiac rehab program today with completion of 36 exercise sessions in Phase II. Pt maintained good attendance and progressed nicely during his participation in rehab as evidenced by increased MET level.   Medication list reconciled. Repeat  PHQ score- 0 .  Pt has made significant lifestyle changes and should be commended for his success. Tye Maryland feels she has achieved her goals during cardiac rehab.   Cathy plans to continue exercise by walking on her own. Tye Maryland has access to a gym at [employer] where she works.Barnet Pall, RN,BSN 12/03/2016 4:47 PM

## 2016-11-25 ENCOUNTER — Other Ambulatory Visit: Payer: Self-pay | Admitting: Physician Assistant

## 2016-11-25 ENCOUNTER — Telehealth (HOSPITAL_COMMUNITY): Payer: Self-pay | Admitting: Physician Assistant

## 2016-11-25 DIAGNOSIS — E1142 Type 2 diabetes mellitus with diabetic polyneuropathy: Secondary | ICD-10-CM

## 2016-11-25 NOTE — Telephone Encounter (Signed)
Advised pt of no insurance coverage. Pt contacted her insurance got it straighten out but also knows that she will have to pay back from Jan 10/2016. Pt will be self pay until her insurance is reestablished. Pt is ok with being self pay...Marland KitchenMarland KitchenMarland Kitchen KJ

## 2016-11-26 ENCOUNTER — Encounter (HOSPITAL_COMMUNITY): Payer: BLUE CROSS/BLUE SHIELD

## 2016-11-26 NOTE — Telephone Encounter (Signed)
Last seen 10/2016 Hx rx only

## 2016-11-29 ENCOUNTER — Encounter (HOSPITAL_COMMUNITY): Payer: BLUE CROSS/BLUE SHIELD

## 2016-12-01 ENCOUNTER — Encounter (HOSPITAL_COMMUNITY): Payer: BLUE CROSS/BLUE SHIELD

## 2016-12-03 ENCOUNTER — Encounter (HOSPITAL_COMMUNITY): Payer: BLUE CROSS/BLUE SHIELD

## 2016-12-03 NOTE — Telephone Encounter (Signed)
Will refill one month until pt RTC for annual.

## 2016-12-06 ENCOUNTER — Encounter (HOSPITAL_COMMUNITY): Payer: BLUE CROSS/BLUE SHIELD

## 2016-12-08 ENCOUNTER — Encounter (HOSPITAL_COMMUNITY): Payer: BLUE CROSS/BLUE SHIELD

## 2016-12-10 ENCOUNTER — Encounter (HOSPITAL_COMMUNITY): Payer: BLUE CROSS/BLUE SHIELD

## 2016-12-13 ENCOUNTER — Encounter (HOSPITAL_COMMUNITY): Payer: BLUE CROSS/BLUE SHIELD

## 2016-12-15 ENCOUNTER — Encounter (HOSPITAL_COMMUNITY): Payer: BLUE CROSS/BLUE SHIELD

## 2016-12-15 ENCOUNTER — Encounter: Payer: Self-pay | Admitting: Physician Assistant

## 2016-12-15 ENCOUNTER — Ambulatory Visit (INDEPENDENT_AMBULATORY_CARE_PROVIDER_SITE_OTHER): Payer: Self-pay | Admitting: Physician Assistant

## 2016-12-15 VITALS — BP 120/84 | HR 67 | Temp 99.2°F | Resp 18 | Ht 62.0 in | Wt 238.4 lb

## 2016-12-15 DIAGNOSIS — S161XXA Strain of muscle, fascia and tendon at neck level, initial encounter: Secondary | ICD-10-CM

## 2016-12-15 DIAGNOSIS — Z794 Long term (current) use of insulin: Secondary | ICD-10-CM

## 2016-12-15 DIAGNOSIS — F419 Anxiety disorder, unspecified: Secondary | ICD-10-CM

## 2016-12-15 DIAGNOSIS — IMO0002 Reserved for concepts with insufficient information to code with codable children: Secondary | ICD-10-CM

## 2016-12-15 DIAGNOSIS — I10 Essential (primary) hypertension: Secondary | ICD-10-CM

## 2016-12-15 DIAGNOSIS — E1165 Type 2 diabetes mellitus with hyperglycemia: Secondary | ICD-10-CM

## 2016-12-15 DIAGNOSIS — K219 Gastro-esophageal reflux disease without esophagitis: Secondary | ICD-10-CM

## 2016-12-15 DIAGNOSIS — G43109 Migraine with aura, not intractable, without status migrainosus: Secondary | ICD-10-CM

## 2016-12-15 DIAGNOSIS — E1142 Type 2 diabetes mellitus with diabetic polyneuropathy: Secondary | ICD-10-CM

## 2016-12-15 DIAGNOSIS — E118 Type 2 diabetes mellitus with unspecified complications: Secondary | ICD-10-CM

## 2016-12-15 DIAGNOSIS — I495 Sick sinus syndrome: Secondary | ICD-10-CM

## 2016-12-15 DIAGNOSIS — E785 Hyperlipidemia, unspecified: Secondary | ICD-10-CM

## 2016-12-15 MED ORDER — ATORVASTATIN CALCIUM 80 MG PO TABS: 80.0000 mg | ORAL_TABLET | Freq: Every day | ORAL | 3 refills | Status: DC

## 2016-12-15 MED ORDER — GABAPENTIN 300 MG PO CAPS: ORAL_CAPSULE | ORAL | 1 refills | Status: DC

## 2016-12-15 MED ORDER — OMEPRAZOLE 20 MG PO CPDR: 20.0000 mg | DELAYED_RELEASE_CAPSULE | Freq: Every day | ORAL | 1 refills | Status: AC

## 2016-12-15 MED ORDER — TRAMADOL HCL 50 MG PO TABS: ORAL_TABLET | ORAL | 0 refills | Status: DC

## 2016-12-15 MED ORDER — LISINOPRIL 10 MG PO TABS: 10.0000 mg | ORAL_TABLET | Freq: Every day | ORAL | 1 refills | Status: DC

## 2016-12-15 MED ORDER — CYCLOBENZAPRINE HCL 10 MG PO TABS: 10.0000 mg | ORAL_TABLET | Freq: Every evening | ORAL | 1 refills | Status: DC | PRN

## 2016-12-15 MED ORDER — ONDANSETRON 8 MG PO TBDP: ORAL_TABLET | ORAL | 3 refills | Status: DC

## 2016-12-15 MED ORDER — CITALOPRAM HYDROBROMIDE 20 MG PO TABS: 20.0000 mg | ORAL_TABLET | Freq: Every day | ORAL | 1 refills | Status: DC

## 2016-12-15 MED ORDER — GLIMEPIRIDE 4 MG PO TABS: 2.0000 mg | ORAL_TABLET | Freq: Two times a day (BID) | ORAL | 1 refills | Status: DC

## 2016-12-15 MED ORDER — METOPROLOL SUCCINATE ER 50 MG PO TB24: 50.0000 mg | ORAL_TABLET | Freq: Every day | ORAL | 1 refills | Status: DC

## 2016-12-15 NOTE — Progress Notes (Signed)
[facility] [address], [place] [address] [phone]  Date:  12/15/2016   Name:  [name] [name]   DOB:  Feb 13, 1956   MRN:  [id number]  PCP:  Ivar Drape, PA    History of Present Illness:  [name] [name] is a 61 y.o. female patient who presents to [facility] for medication refill.     GERD: she has occasional nausea with headache.  Last time she used zofran was for a stomach bug.    HL/HTN: 889 or less systolic.  She has an appt. Next month.  No chest pains, palpitations, sob, leg swellings.  3 days per week of exercise recline seated cycling.  Cycling with arms helps her shoulder pain.    PAIN: neck pain across the back when she is doing strenuous work and lifting.   DEPRESSION: patient reports that this is working well with her.  Anxiety stays at [place].  General symptoms would be anger associated with stressors, but these occur minimally now.    DM2: Fasting glucose around 141 in the morning.  Diet consist of protein.  She has stopped eating rice.  Not eating a lot of processed foods.  Smaller portion of food, luncheon portion.      Wt Readings from Last 3 Encounters:  12/15/16 238 lb 6.4 oz (108.1 kg)  12/02/16 244 lb 11.4 oz (111 kg)  11/22/16 243 lb (110.2 kg)     Chief Complaint  Patient presents with  . Medication Refill    Lipitor, Celexa, Lisinopril, Toprol-xl, Prilosec, Zofran-Odt, Tramadol (would like an increase in Tramadol)      Patient Active Problem List   Diagnosis Date Noted  . S/P CABG x 3 07/22/2016  . Multiple vessel coronary artery disease 07/21/2016  . STEMI involving left circumflex coronary artery ([place]) 07/20/2016  . Asthmatic bronchitis 04/26/2016  . Allergic urticaria 04/26/2016  . Environmental allergies 04/26/2016  . Neck pain 04/26/2016  . Anxiety 04/26/2016  . Sinoatrial node dysfunction (HCC) 04/26/2016  . Pacemaker Medtronic   . Ventricular tachycardia, nonsustained-during exercise testing 03/07/2013  . OSA  (obstructive sleep apnea) 05/25/2011  . Hx of adenomatous colonic polyps 05/25/2011  . Migraine 05/25/2011  . Diabetes mellitus type 2, uncontrolled, with complications ([place]) 16/94/5038  . GERD (gastroesophageal reflux disease) 05/25/2011  . NAFLD (nonalcoholic fatty liver disease) 05/25/2011  . Hyperlipidemia 05/25/2011  . Severe essential hypertension 09/16/2009  . DIASTOLIC HEART FAILURE, CHRONIC 09/16/2009    Past Medical History:  Diagnosis Date  . Allergy   . Anal fissure   . Anxiety   . Arthritis   . Asthma   . Cataract 2010    left  . Colon polyps    diverticulosis  03-2011/2005  . Depression   . Diabetes mellitus 2012   T2DM  . Diverticulosis   . Fatty liver 04/2011  . GERD (gastroesophageal reflux disease)   . Hyperlipidemia   . Hypertension severe   . Migraine   . Obesity   . OSA (obstructive sleep apnea)    C-Pap  . Pacemaker Medtronic   . Sinoatrial node dysfunction (HCC)    syncope    Past Surgical History:  Procedure Laterality Date  . ABDOMINAL HYSTERECTOMY  2000   total  . CARDIAC CATHETERIZATION N/A 07/20/2016   Procedure: Left Heart Cath and Coronary Angiography;  Surgeon: Peter M Martinique, MD;  Location: [facility];  Service: Cardiovascular;  Laterality: N/A;  . CORONARY ARTERY BYPASS GRAFT N/A 07/22/2016  Procedure: CORONARY ARTERY BYPASS GRAFTING (CABG) x three , using left internal mammary artery and right leg greater saphenous vein harvested endoscopically;  Surgeon: Ivin Poot, MD;  Location: [facility];  Service: Open Heart Surgery;  Laterality: N/A;  . EYE SURGERY Right 2010  . OOPHORECTOMY    . PACEMAKER PLACEMENT    . T/A right knee    . TEE WITHOUT CARDIOVERSION N/A 07/22/2016   Procedure: TRANSESOPHAGEAL ECHOCARDIOGRAM (TEE);  Surgeon: Ivin Poot, MD;  Location: [facility];  Service: Open Heart Surgery;  Laterality: N/A;  . TONSILLECTOMY      Social History  Substance Use Topics  . Smoking status: Former Smoker    Types:  Cigarettes    Quit date: 10/12/1991  . Smokeless tobacco: Never Used     Comment: Quit 20 yrs ago  . Alcohol use No    Family History  Problem Relation Age of Onset  . Heart disease Mother   . Cancer Mother 99    lung  . Breast cancer Mother     maternal grandmother  . Diabetes Sister     type 2  . Breast cancer Brother   . Cancer Maternal Grandmother     breast  . Leukemia Paternal Grandmother     neoplast  . Aneurysm Paternal Grandfather     abdominal (stomach)  . Heart disease Father   . Colon cancer Neg Hx     Allergies  Allergen Reactions  . Adhesive [Tape] Other (See Comments)    SKIN BLISTERS  . Penicillins     Swelling   . Red Yeast Rice Hives  . Sulfa Drugs Cross Reactors Hives    Medication list has been reviewed and updated.  Current Outpatient Prescriptions on File Prior to Visit  Medication Sig Dispense Refill  . albuterol (PROVENTIL HFA;VENTOLIN HFA) 108 (90 Base) MCG/ACT inhaler Inhale into the lungs every 6 (six) hours as needed for wheezing or shortness of breath.    Marland Kitchen aspirin EC 325 MG EC tablet Take 1 tablet (325 mg total) by mouth daily. 30 tablet 0  . cetirizine (ZYRTEC) 10 MG tablet Take 10 mg by mouth daily.      Marland Kitchen dicyclomine (BENTYL) 20 MG tablet Take 20 mg by mouth 3 (three) times daily.    . fish oil-omega-3 fatty acids 1000 MG capsule Take 2 g by mouth daily.     . fluticasone (FLONASE) 50 MCG/ACT nasal spray Place 2 sprays into both nostrils daily. 48 g 3  . fluticasone (FLOVENT HFA) 110 MCG/ACT inhaler Inhale 2 puffs into the lungs 2 (two) times daily. 1 Inhaler 12  . gabapentin (NEURONTIN) 300 MG capsule Take 1 tablet by mouth in the AM and 2 tablets by mouth in the PM    . glucose blood test strip Use as instructed 200 each 5  . hydrOXYzine (ATARAX/VISTARIL) 25 MG tablet Take 25 mg by mouth every 8 (eight) hours as needed for anxiety.    . Multiple Vitamin (MULTIVITAMIN) tablet Take 1 tablet by mouth daily.    Marland Kitchen triamcinolone cream  (KENALOG) 0.1 % Apply 1 application topically as needed. 30 g 2   No current facility-administered medications on file prior to visit.     ROS ROS otherwise unremarkable unless listed above.   Physical Examination: BP 120/84   Pulse 67   Temp 99.2 F (37.3 C) (Oral)   Resp 18   Ht 5\' 2"  (1.575 m)   Wt 238 lb 6.4 oz (108.1 kg)  SpO2 95%   BMI 43.60 kg/m  Ideal Body Weight: Weight in (lb) to have BMI = 25: 136.4  Physical Exam  Constitutional: She is oriented to person, place, and time. She appears well-developed and well-nourished. No distress.  HENT:  Head: Normocephalic and atraumatic.  Right Ear: External ear normal.  Left Ear: External ear normal.  Eyes: Conjunctivae and EOM are normal. Pupils are equal, round, and reactive to light.  Cardiovascular: Normal rate, regular rhythm and normal heart sounds.  Exam reveals no distant heart sounds and no friction rub.   No murmur heard. Pulses:      Carotid pulses are 2+ on the right side, and 2+ on the left side.      Radial pulses are 2+ on the right side, and 2+ on the left side.  Pulmonary/Chest: Effort normal. No apnea. No respiratory distress. She has no decreased breath sounds. She has no wheezes. She has no rhonchi.  Feet:  Right Foot:  Protective Sensation: 6 sites tested. 6 sites sensed.  Skin Integrity: Negative for ulcer or blister.  Left Foot:  Protective Sensation: 6 sites tested. 6 sites sensed.  Skin Integrity: Negative for ulcer or blister.  Neurological: She is alert and oriented to person, place, and time.  Skin: She is not diaphoretic.  Psychiatric: She has a normal mood and affect. Her behavior is normal.     Assessment and Plan: [name] [name] is a 61 y.o. female who is here today for medication refill.   Essential hypertension Stable and well controlled, refill given for 6 months.  -     Basic metabolic panel -     Lipid panel -     lisinopril (PRINIVIL,ZESTRIL) 10 MG tablet; Take 1 tablet (10  mg total) by mouth daily. -     metoprolol succinate (TOPROL-XL) 50 MG 24 hr tablet; Take 1 tablet (50 mg total) by mouth daily. Take with or immediately following a meal.  Hyperlipidemia, unspecified hyperlipidemia type -     Basic metabolic panel -     Lipid panel -     atorvastatin (LIPITOR) 80 MG tablet; Take 1 tablet (80 mg total) by mouth daily at 6 PM.  Migraine with aura and without status migrainosus, not intractable Stable, will refill for as needed purposes. -     ondansetron (ZOFRAN-ODT) 8 MG disintegrating tablet; DISSOLVE 1 ON TONGUE EVERY 12 HOURS AS NEEDED FOR NAUSEA  Sinoatrial node dysfunction (HCC) Refill given -     metoprolol succinate (TOPROL-XL) 50 MG 24 hr tablet; Take 1 tablet (50 mg total) by mouth daily. Take with or immediately following a meal.  Type 2 diabetes mellitus with diabetic polyneuropathy, without long-term current use of insulin (HCC) -     glimepiride (AMARYL) 4 MG tablet; Take 0.5 tablets (2 mg total) by mouth 2 (two) times daily. -     gabapentin (NEURONTIN) 300 MG capsule; TAKE ONE CAPSULE BY MOUTH IN THE MORNING AND TWO CAPSULES BY MOUTH AT BEDTIME  Strain of neck muscle, initial encounter -     traMADol (ULTRAM) 50 MG tablet; Take one or two by mouth every 4-6 hours PRN moderate pain  Gastroesophageal reflux disease, esophagitis presence not specified Stable, will refill 6 mos -     ondansetron (ZOFRAN-ODT) 8 MG disintegrating tablet; DISSOLVE 1 ON TONGUE EVERY 12 HOURS AS NEEDED FOR NAUSEA -     omeprazole (PRILOSEC) 20 MG capsule; Take 1 capsule (20 mg total) by mouth daily.  Anxiety Well controlled  filled for 1 year -     citalopram (CELEXA) 20 MG tablet; Take 1 tablet (20 mg total) by mouth daily. Take 1 tablet by mouth daily -     cyclobenzaprine (FLEXERIL) 10 MG tablet; Take 1 tablet (10 mg total) by mouth at bedtime as needed and may repeat dose one time if needed for muscle spasms. Careful of sedation.  Ivar Drape,  PA-C [facility] 3/7/201812:17 PM

## 2016-12-15 NOTE — Patient Instructions (Addendum)
The flexeril is a muscle relaxant.  Please be careful of sedation when getting up at night.  You can not operate heavy machinery with this medication.  You can ice your neck three times per day when in pain.  Performing stretches will help as well three times per day followed by icing.   Please continue to check your blood pressure, and glucose.  We will meet in 3-6 months, depending on the lab results.   Diabetes Mellitus and Food It is important for you to manage your blood sugar (glucose) level. Your blood glucose level can be greatly affected by what you eat. Eating healthier foods in the appropriate amounts throughout the day at about the same time each day will help you control your blood glucose level. It can also help slow or prevent worsening of your diabetes mellitus. Healthy eating may even help you improve the level of your blood pressure and reach or maintain a healthy weight. General recommendations for healthful eating and cooking habits include:  Eating meals and snacks regularly. Avoid going long periods of time without eating to lose weight.  Eating a diet that consists mainly of plant-based foods, such as fruits, vegetables, nuts, legumes, and whole grains.  Using low-heat cooking methods, such as baking, instead of high-heat cooking methods, such as deep frying. Work with your dietitian to make sure you understand how to use the Nutrition Facts information on food labels. How can food affect me? Carbohydrates  Carbohydrates affect your blood glucose level more than any other type of food. Your dietitian will help you determine how many carbohydrates to eat at each meal and teach you how to count carbohydrates. Counting carbohydrates is important to keep your blood glucose at a healthy level, especially if you are using insulin or taking certain medicines for diabetes mellitus. Alcohol  Alcohol can cause sudden decreases in blood glucose (hypoglycemia), especially if you use  insulin or take certain medicines for diabetes mellitus. Hypoglycemia can be a life-threatening condition. Symptoms of hypoglycemia (sleepiness, dizziness, and disorientation) are similar to symptoms of having too much alcohol. If your health care provider has given you approval to drink alcohol, do so in moderation and use the following guidelines:  Women should not have more than one drink per day, and men should not have more than two drinks per day. One drink is equal to:  12 oz of beer.  5 oz of wine.  1 oz of hard liquor.  Do not drink on an empty stomach.  Keep yourself hydrated. Have water, diet soda, or unsweetened iced tea.  Regular soda, juice, and other mixers might contain a lot of carbohydrates and should be counted. What foods are not recommended? As you make food choices, it is important to remember that all foods are not the same. Some foods have fewer nutrients per serving than other foods, even though they might have the same number of calories or carbohydrates. It is difficult to get your body what it needs when you eat foods with fewer nutrients. Examples of foods that you should avoid that are high in calories and carbohydrates but low in nutrients include:  Trans fats (most processed foods list trans fats on the Nutrition Facts label).  Regular soda.  Juice.  Candy.  Sweets, such as cake, pie, doughnuts, and cookies.  Fried foods. What foods can I eat? Eat nutrient-rich foods, which will nourish your body and keep you healthy. The food you should eat also will depend on several factors,  including:  The calories you need.  The medicines you take.  Your weight.  Your blood glucose level.  Your blood pressure level.  Your cholesterol level. You should eat a variety of foods, including:  Protein.  Lean cuts of meat.  Proteins low in saturated fats, such as fish, egg whites, and beans. Avoid processed meats.  Fruits and vegetables.  Fruits and  vegetables that may help control blood glucose levels, such as apples, mangoes, and yams.  Dairy products.  Choose fat-free or low-fat dairy products, such as milk, yogurt, and cheese.  Grains, bread, pasta, and rice.  Choose whole grain products, such as multigrain bread, whole oats, and brown rice. These foods may help control blood pressure.  Fats.  Foods containing healthful fats, such as nuts, avocado, olive oil, canola oil, and fish. Does everyone with diabetes mellitus have the same meal plan? Because every person with diabetes mellitus is different, there is not one meal plan that works for everyone. It is very important that you meet with a dietitian who will help you create a meal plan that is just right for you. This information is not intended to replace advice given to you by your health care provider. Make sure you discuss any questions you have with your health care provider. Document Released: 06/24/2005 Document Revised: 03/04/2016 Document Reviewed: 08/24/2013 Elsevier Interactive Patient Education  2017 [employer].    IF you received an x-ray today, you will receive an invoice from [facility]. Please contact [facility] at [phone] with questions or concerns regarding your invoice.   IF you received labwork today, you will receive an invoice from [facility]. Please contact [facility] at [phone] with questions or concerns regarding your invoice.   Our billing staff will not be able to assist you with questions regarding bills from these companies.  You will be contacted with the lab results as soon as they are available. The fastest way to get your results is to activate your My Chart account. Instructions are located on the last page of this paperwork. If you have not heard from [place] regarding the results in 2 weeks, please contact this office.

## 2016-12-16 LAB — BASIC METABOLIC PANEL
BUN / CREAT RATIO: 20 (ref 12–28)
BUN: 18 mg/dL (ref 8–27)
CO2: 28 mmol/L (ref 18–29)
CREATININE: 0.92 mg/dL (ref 0.57–1.00)
Calcium: 10.6 mg/dL — ABNORMAL HIGH (ref 8.7–10.3)
Chloride: 104 mmol/L (ref 96–106)
GFR calc Af Amer: 77 mL/min/{1.73_m2} (ref >59)
GFR calc non Af Amer: 67 mL/min/{1.73_m2} (ref >59)
Glucose: 102 mg/dL — ABNORMAL HIGH (ref 65–99)
Potassium: 5.8 mmol/L — ABNORMAL HIGH (ref 3.5–5.2)
Sodium: 145 mmol/L — ABNORMAL HIGH (ref 134–144)

## 2016-12-16 LAB — LIPID PANEL
CHOLESTEROL TOTAL: 165 mg/dL (ref 100–199)
Chol/HDL Ratio: 3.6 ratio units (ref 0.0–4.4)
HDL: 46 mg/dL (ref >39)
LDL Calculated: 76 mg/dL (ref 0–99)
Triglycerides: 216 mg/dL — ABNORMAL HIGH (ref 0–149)
VLDL CHOLESTEROL CAL: 43 mg/dL — AB (ref 5–40)

## 2016-12-16 LAB — HEMOGLOBIN A1C
Est. average glucose Bld gHb Est-mCnc: 154 mg/dL
Hgb A1c MFr Bld: 7 % — ABNORMAL HIGH (ref 4.8–5.6)

## 2016-12-17 ENCOUNTER — Encounter (HOSPITAL_COMMUNITY): Payer: BLUE CROSS/BLUE SHIELD

## 2016-12-20 ENCOUNTER — Encounter (HOSPITAL_COMMUNITY): Payer: BLUE CROSS/BLUE SHIELD

## 2016-12-22 ENCOUNTER — Encounter (HOSPITAL_COMMUNITY): Payer: BLUE CROSS/BLUE SHIELD

## 2016-12-24 ENCOUNTER — Encounter: Payer: Self-pay | Admitting: Physician Assistant

## 2016-12-24 ENCOUNTER — Encounter (HOSPITAL_COMMUNITY): Payer: BLUE CROSS/BLUE SHIELD

## 2016-12-24 LAB — HM DIABETES EYE EXAM

## 2016-12-27 ENCOUNTER — Encounter (HOSPITAL_COMMUNITY): Payer: BLUE CROSS/BLUE SHIELD

## 2016-12-29 ENCOUNTER — Encounter (HOSPITAL_COMMUNITY): Payer: BLUE CROSS/BLUE SHIELD

## 2016-12-31 ENCOUNTER — Encounter (HOSPITAL_COMMUNITY): Payer: BLUE CROSS/BLUE SHIELD

## 2017-01-03 ENCOUNTER — Encounter (HOSPITAL_COMMUNITY): Payer: BLUE CROSS/BLUE SHIELD

## 2017-01-05 ENCOUNTER — Encounter (HOSPITAL_COMMUNITY): Payer: BLUE CROSS/BLUE SHIELD

## 2017-01-07 ENCOUNTER — Encounter (HOSPITAL_COMMUNITY): Payer: BLUE CROSS/BLUE SHIELD

## 2017-01-10 ENCOUNTER — Encounter (HOSPITAL_COMMUNITY): Payer: BLUE CROSS/BLUE SHIELD

## 2017-01-12 ENCOUNTER — Encounter (HOSPITAL_COMMUNITY): Payer: BLUE CROSS/BLUE SHIELD

## 2017-01-14 ENCOUNTER — Encounter (HOSPITAL_COMMUNITY): Payer: BLUE CROSS/BLUE SHIELD

## 2017-01-17 ENCOUNTER — Encounter (HOSPITAL_COMMUNITY): Payer: BLUE CROSS/BLUE SHIELD

## 2017-01-19 ENCOUNTER — Encounter (HOSPITAL_COMMUNITY): Payer: BLUE CROSS/BLUE SHIELD

## 2017-01-21 ENCOUNTER — Encounter (HOSPITAL_COMMUNITY): Payer: BLUE CROSS/BLUE SHIELD

## 2017-01-24 ENCOUNTER — Encounter (HOSPITAL_COMMUNITY): Payer: BLUE CROSS/BLUE SHIELD

## 2017-06-25 NOTE — Progress Notes (Addendum)
Cardiology Office Note Date:  06/29/2017  Patient ID:  [name] [name], [name] [name] September 03, 1956, MRN [id number] PCP:  Joretta Bachelor, PA  Cardiologist:  Dr. Caryl Comes    Chief Complaint:  Device visit  History of Present Illness: [name] [name] is a 61 y.o. female with history of sinus node dysfunction w/PPM, CAD s/p CABG Oct 12,2017, post-op AFib tx with amiodarone briefly, no a/c, OSA w/CPAP and reported compliance, HTN, HLD, HFpEF.  The patient was observed to have elevated BP readings at peak exercise during cardiac rehab, quickly recovering with rest.  She was seen by myself in Feb to f/u on this.  Her lisinopril was up-titrated.  She was feeling very well, no CP, palpitations or SOB, no dizziness, near syncope or syncope. At that visit her amiodarone noted to have been inadvertantly resumed and was discontinued.   She is doing very well.  Denies any kind of CP, palpitations, no dizziness, near syncope or syncope.  She has asthma and says the heat can bother her otherwise no SOB.  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  She reports her labs are routinely monitored by her PMD,  including her lipids.  She has been on 325mg  ASA since her bypass.  Device information: MDT dual chamber PPM, implanted 09/10/08, Dr. Lovena Le, sick sinus syndrome (follows w/Dr. Caryl Comes)   Past Medical History:  Diagnosis Date  . Allergy   . Anal fissure   . Anxiety   . Arthritis   . Asthma   . Cataract 2010    left  . Colon polyps    diverticulosis  03-2011/2005  . Depression   . Diabetes mellitus 2012   T2DM  . Diverticulosis   . Fatty liver 04/2011  . GERD (gastroesophageal reflux disease)   . Hyperlipidemia   . Hypertension severe   . Migraine   . Obesity   . OSA (obstructive sleep apnea)    C-Pap  . Pacemaker Medtronic   . Sinoatrial node dysfunction (HCC)    syncope    Past Surgical History:  Procedure Laterality Date  .  ABDOMINAL HYSTERECTOMY  2000   total  . CARDIAC CATHETERIZATION N/A 07/20/2016   Procedure: Left Heart Cath and Coronary Angiography;  Surgeon: Peter M Martinique, MD;  Location: [facility];  Service: Cardiovascular;  Laterality: N/A;  . CORONARY ARTERY BYPASS GRAFT N/A 07/22/2016   Procedure: CORONARY ARTERY BYPASS GRAFTING (CABG) x three , using left internal mammary artery and right leg greater saphenous vein harvested endoscopically;  Surgeon: Ivin Poot, MD;  Location: [facility];  Service: Open Heart Surgery;  Laterality: N/A;  . EYE SURGERY Right 2010  . OOPHORECTOMY    . PACEMAKER PLACEMENT    . T/A right knee    . TEE WITHOUT CARDIOVERSION N/A 07/22/2016   Procedure: TRANSESOPHAGEAL ECHOCARDIOGRAM (TEE);  Surgeon: Ivin Poot, MD;  Location: [facility];  Service: Open Heart Surgery;  Laterality: N/A;  . TONSILLECTOMY      Current Outpatient Prescriptions  Medication Sig Dispense Refill  . albuterol (PROVENTIL HFA;VENTOLIN HFA) 108 (90 Base) MCG/ACT inhaler Inhale into the lungs every 6 (six) hours as needed for wheezing or shortness of breath.    Marland Kitchen aspirin EC 325 MG EC tablet Take 1 tablet (325 mg total) by mouth daily. 30 tablet 0  . atorvastatin (LIPITOR) 80 MG tablet Take 1 tablet (80 mg total) by mouth daily at 6 PM.  30 tablet 3  . cetirizine (ZYRTEC) 10 MG tablet Take 10 mg by mouth daily.      . citalopram (CELEXA) 20 MG tablet Take 1 tablet (20 mg total) by mouth daily. Take 1 tablet by mouth daily 90 tablet 1  . cyclobenzaprine (FLEXERIL) 10 MG tablet Take 1 tablet (10 mg total) by mouth at bedtime as needed and may repeat dose one time if needed for muscle spasms. Careful of sedation. 30 tablet 1  . dicyclomine (BENTYL) 20 MG tablet Take 20 mg by mouth 3 (three) times daily.    . fish oil-omega-3 fatty acids 1000 MG capsule Take 2 g by mouth daily.     . fluticasone (FLONASE) 50 MCG/ACT nasal spray Place 2 sprays into both nostrils daily. 48 g 3  . fluticasone (FLOVENT  HFA) 110 MCG/ACT inhaler Inhale 2 puffs into the lungs 2 (two) times daily. 1 Inhaler 12  . gabapentin (NEURONTIN) 300 MG capsule Take 1 tablet by mouth in the AM and 2 tablets by mouth in the PM    . gabapentin (NEURONTIN) 300 MG capsule TAKE ONE CAPSULE BY MOUTH IN THE MORNING AND TWO CAPSULES BY MOUTH AT BEDTIME 270 capsule 1  . glimepiride (AMARYL) 4 MG tablet Take 0.5 tablets (2 mg total) by mouth 2 (two) times daily. 90 tablet 1  . glucose blood test strip Use as instructed 200 each 5  . hydrOXYzine (ATARAX/VISTARIL) 25 MG tablet Take 25 mg by mouth every 8 (eight) hours as needed for anxiety.    Marland Kitchen lisinopril (PRINIVIL,ZESTRIL) 10 MG tablet Take 1 tablet (10 mg total) by mouth daily. 90 tablet 1  . metoprolol succinate (TOPROL-XL) 50 MG 24 hr tablet Take 1 tablet (50 mg total) by mouth daily. Take with or immediately following a meal. 90 tablet 1  . Multiple Vitamin (MULTIVITAMIN) tablet Take 1 tablet by mouth daily.    Marland Kitchen omeprazole (PRILOSEC) 20 MG capsule Take 1 capsule (20 mg total) by mouth daily. 90 capsule 1  . ondansetron (ZOFRAN-ODT) 8 MG disintegrating tablet DISSOLVE 1 ON TONGUE EVERY 12 HOURS AS NEEDED FOR NAUSEA 30 tablet 3  . traMADol (ULTRAM) 50 MG tablet Take one or two by mouth every 4-6 hours PRN moderate pain 100 tablet 0  . triamcinolone cream (KENALOG) 0.1 % Apply 1 application topically as needed. 30 g 2   No current facility-administered medications for this visit.     Allergies:   Adhesive [tape]; Penicillins; Red yeast rice; and Sulfa drugs cross reactors   Social History:  The patient  reports that she quit smoking about 25 years ago. Her smoking use included Cigarettes. She has never used smokeless tobacco. She reports that she does not drink alcohol or use drugs.   Family History:  The patient's family history includes Aneurysm in her paternal grandfather; Breast cancer in her brother and mother; Cancer in her maternal grandmother; Cancer (age of onset: 66) in  her mother; Diabetes in her sister; Heart disease in her father and mother; Leukemia in her paternal grandmother.  ROS:  Please see the history of present illness.  All other systems are reviewed and otherwise negative.   PHYSICAL EXAM:  VS:  BP 136/72   Pulse 80   Ht 5\' 2"  (1.575 m)   Wt 241 lb (109.3 kg)   BMI 44.08 kg/m  BMI: Body mass index is 44.08 kg/m. Well nourished, well developed, in no acute distress  HEENT: normocephalic, atraumatic  Neck: no JVD, carotid bruits or  masses Cardiac:  RRR; no significant murmurs, no rubs, or gallops Lungs:  CTA b/l, no wheezing, rhonchi or rales  Abd: soft, nontender, obese MS: no deformity or atrophy Ext:  no edema  Skin: warm and dry, no rash Neuro:  No gross deficits appreciated Psych: euthymic mood, full affect  PPM site is stable, no tethering, no erythema, skin changes, tenderness or fluid collection   EKG:  Done today and reviewed by myself is SR, appears unchanged PPM interrogation done today and reviewed by myself:  stable battery and lead measurements, 99.7% AS/VS,, no AF, one rate drop tx   07/21/16: TTE Study Conclusions - Left ventricle: The cavity size was normal. Wall thickness was   normal. Systolic function was normal. The estimated ejection   fraction was in the range of 60% to 65%. Wall motion was normal;   there were no regional wall motion abnormalities. Doppler   parameters are consistent with abnormal left ventricular   relaxation (grade 1 diastolic dysfunction). - Left atrium: The atrium was mildly dilated. Anterior-posterior   dimension: 44 mm. - Right ventricle: Pacer wire or catheter noted in right ventricle. Impressions: - Compared to the prior study, there has been no significant   interval change.   Recent Labs: 07/20/2016: B Natriuretic Peptide 88.8 07/23/2016: Magnesium 2.0 09/15/2016: ALT 23; Hemoglobin WILL FOLLOW; Platelets WILL FOLLOW 12/15/2016: BUN 18; Creatinine, Ser 0.92; Potassium 5.8;  Sodium 145  07/21/2016: VLDL 51 12/15/2016: Chol/HDL Ratio 3.6; Cholesterol, Total 165; HDL 46; LDL Calculated 76; Triglycerides 216   CrCl cannot be calculated (Patient's most recent lab result is older than the maximum 21 days allowed.).   Wt Readings from Last 3 Encounters:  06/29/17 241 lb (109.3 kg)  12/15/16 238 lb 6.4 oz (108.1 kg)  12/02/16 244 lb 11.4 oz (111 kg)     Other studies reviewed: Additional studies/records reviewed today include: summarized above  ASSESSMENT AND PLAN:  1. CAD  S/p NSTEMI, CABG Oct 2017, (left internal mammary artery to LAD, saphenous vein graft to diagonal, saphenous vein graft to obtuse marginal).     no CP     On ASA, statin, BB  Trigs particularly are high by her last check, she is taking a fish oil supplement and we discussed dietary choices,exercise and weight loss Recommend reducing her ASA to 81mg  daily Recommend she have cardiologist outside of EP Recommend walking routinely for exercise  2. HTN     Looks OK  3. Sinus node dysfunction w/PPM      Normal device function, no changes  4. HLD      On high dose statin     Reports monitored with her PMD, discussed her Trigs as above  5. Post op, (CABG) PAF     None on her pacer check     CHA2DS2Vasc is 3-4     Never started on a/c     No recurrent AF to date   Disposition: resume Q 3 month remotes, 1 year in-clinic EP/device visit, she will be referred to Dr. Saunders Revel at his next available for cardiology follow up/coronary management.  Current medicines are reviewed at length with the patient today.  The patient did not have any concerns regarding medicines.  Haywood Lasso, PA-C 06/29/2017 8:23 AM     [place] [address] [address] [phone] (office)  [phone] (fax)

## 2017-06-28 ENCOUNTER — Encounter: Payer: Self-pay | Admitting: Physician Assistant

## 2017-06-28 DIAGNOSIS — K589 Irritable bowel syndrome without diarrhea: Secondary | ICD-10-CM | POA: Insufficient documentation

## 2017-06-29 ENCOUNTER — Ambulatory Visit (INDEPENDENT_AMBULATORY_CARE_PROVIDER_SITE_OTHER): Payer: Self-pay | Admitting: Physician Assistant

## 2017-06-29 VITALS — BP 136/72 | HR 80 | Ht 62.0 in | Wt 241.0 lb

## 2017-06-29 DIAGNOSIS — I480 Paroxysmal atrial fibrillation: Secondary | ICD-10-CM

## 2017-06-29 DIAGNOSIS — E7849 Other hyperlipidemia: Secondary | ICD-10-CM

## 2017-06-29 DIAGNOSIS — I5032 Chronic diastolic (congestive) heart failure: Secondary | ICD-10-CM

## 2017-06-29 DIAGNOSIS — Z950 Presence of cardiac pacemaker: Secondary | ICD-10-CM

## 2017-06-29 DIAGNOSIS — I10 Essential (primary) hypertension: Secondary | ICD-10-CM

## 2017-06-29 DIAGNOSIS — I2510 Atherosclerotic heart disease of native coronary artery without angina pectoris: Secondary | ICD-10-CM

## 2017-06-29 DIAGNOSIS — E784 Other hyperlipidemia: Secondary | ICD-10-CM

## 2017-06-29 NOTE — Patient Instructions (Signed)
Medication Instructions:   START TAKING ASPIRIN 81 MG ONCE A DAY   If you need a refill on your cardiac medications before your next appointment, please call your pharmacy.  Labwork: NONE ORDERED  TODAY    Testing/Procedures: NONE ORDERED  TODAY    Follow-Up:  WITH DR END TO BE ESTABLISHED  AS NEW PT FIRST AVAILABLE     Your physician wants you to follow-up in: [place] will receive a reminder letter in the mail two months in advance. If you don't receive a letter, please call our office to schedule the follow-up appointment.  Remote monitoring is used to monitor your Pacemaker of ICD from home. This monitoring reduces the number of office visits required to check your device to one time per year. It allows [place] to keep an eye on the functioning of your device to ensure it is working properly. You are scheduled for a device check from home on . 09-28-17   You may send your transmission at any time that day. If you have a wireless device, the transmission will be sent automatically. After your physician reviews your transmission, you will receive a postcard with your next transmission date.     Any Other Special Instructions Will Be Listed Below (If Applicable).

## 2017-07-05 ENCOUNTER — Other Ambulatory Visit: Payer: Self-pay | Admitting: Physician Assistant

## 2017-07-05 DIAGNOSIS — S161XXA Strain of muscle, fascia and tendon at neck level, initial encounter: Secondary | ICD-10-CM

## 2017-07-07 ENCOUNTER — Other Ambulatory Visit: Payer: Self-pay | Admitting: Physician Assistant

## 2017-07-07 DIAGNOSIS — E785 Hyperlipidemia, unspecified: Secondary | ICD-10-CM

## 2017-07-19 ENCOUNTER — Other Ambulatory Visit: Payer: Self-pay | Admitting: Internal Medicine

## 2017-08-01 ENCOUNTER — Encounter: Payer: Self-pay | Admitting: Internal Medicine

## 2017-08-01 ENCOUNTER — Ambulatory Visit (INDEPENDENT_AMBULATORY_CARE_PROVIDER_SITE_OTHER): Payer: Self-pay | Admitting: Internal Medicine

## 2017-08-01 VITALS — BP 148/80 | HR 75 | Ht 62.0 in | Wt 240.8 lb

## 2017-08-01 DIAGNOSIS — E785 Hyperlipidemia, unspecified: Secondary | ICD-10-CM

## 2017-08-01 DIAGNOSIS — I2510 Atherosclerotic heart disease of native coronary artery without angina pectoris: Secondary | ICD-10-CM

## 2017-08-01 DIAGNOSIS — I495 Sick sinus syndrome: Secondary | ICD-10-CM

## 2017-08-01 DIAGNOSIS — I10 Essential (primary) hypertension: Secondary | ICD-10-CM

## 2017-08-01 DIAGNOSIS — I5032 Chronic diastolic (congestive) heart failure: Secondary | ICD-10-CM

## 2017-08-01 NOTE — Patient Instructions (Signed)
Medication Instructions:  Your physician recommends that you continue on your current medications as directed. Please refer to the Current Medication list given to you today.  Labwork: BMET today.  Testing/Procedures: None  Follow-Up: Your physician wants you to follow-up in: 6 months with Dr End. (April 2019).  You will receive a reminder letter in the mail two months in advance. If you don't receive a letter, please call our office to schedule the follow-up appointment.        If you need a refill on your cardiac medications before your next appointment, please call your pharmacy.

## 2017-08-01 NOTE — Progress Notes (Signed)
Follow-up Outpatient Visit Date: 08/01/2017  Primary Care Provider: Joretta Bachelor, [place] [address]  Chief Complaint: Follow-up of coronary artery disease  HPI:  [name] [name] is a 61 y.o. year-old female with history of coronary artery disease status post CABG (T10/2017) with postoperative atrial fibrillation, hypertension, hyperlipidemia, HFpEF, sinus node dysfunction status post permanent pacemaker, and sleep apnea on CPAP, who presents for follow-up of coronary artery disease and diastolic heart failure.  She was previously followed by Dr. Caryl Comes and was last seen in our office about a month ago by Tommye Standard, PA.  Today, [name] [name] reports feeling well with the exception of chronic shortness of breath.  This typically occurs when she exerts herself quite a bit.  It predates her NSTEMI and CABG by several years.  She has not had any further chest pain.  She notes occasional palpitations when she is very active.  These typically last for a minute or two and then resolve on their own.  She has not had any lightheadedness or edema.  She wears CPAP nightly and is without orthopnea and PND.  --------------------------------------------------------------------------------------------------  Cardiovascular History & Procedures: Cardiovascular Problems:  Coronary artery disease status post CABG (07/2016)  HFpEF  Sinus node dysfunction status post permanent pacemaker  Risk Factors:  Known coronary artery disease, hypertension, hyperlipidemia, and obesity  Cath/PCI:  LHC (07/20/16): The LMCA normal.  LAD with 90% proximal, 80% mid, and 70% distal stenoses as well as 90% stenosis of the ostium of a large septal branch.  LCx with 100% ostial/proximal occlusion with right to left collaterals.  PL branch with 100% occlusion with filling via left-to-right collaterals.  Mid anterior hypokinesis with LVEF of 50-55%.  CV Surgery:  CABG (07/25/16): LIMA to LAD, SVG to  diagonal, and SVG to OM).  EP Procedures and Devices:  Dual-chamber pacemaker (09/10/08, Dr. Caryl Comes): Medtronic  Non-Invasive Evaluation(s):  TTE (07/21/16): Normal LV size and wall thickness.  LVEF 60-65% with normal wall motion.  Grade 1 diastolic dysfunction.  Mild left atrial enlargement.  Normal RV size and function.  Exercise MPI (01/30/13): Low risk study without ischemia.  Poor exercise capacity.  Wide-complex tachycardia during stress (question rate related LBBB).  LVEF 73%.  Recent CV Pertinent Labs: Lab Results  Component Value Date   CHOL 165 12/15/2016   HDL 46 12/15/2016   LDLCALC 76 12/15/2016   LDLDIRECT 195 (H) 04/18/2013   TRIG 216 (H) 12/15/2016   CHOLHDL 3.6 12/15/2016   CHOLHDL 6.5 07/21/2016   INR 1.33 07/22/2016   BNP 88.8 07/20/2016   BNP 196.3 (H) 06/27/2014   K 5.8 (H) 12/15/2016   MG 2.0 07/23/2016   BUN 18 12/15/2016   CREATININE 0.92 12/15/2016   CREATININE 0.92 07/20/2016    Past medical and surgical history were reviewed and updated in EPIC.  Current Meds  Medication Sig  . albuterol (PROVENTIL HFA;VENTOLIN HFA) 108 (90 Base) MCG/ACT inhaler Inhale into the lungs every 6 (six) hours as needed for wheezing or shortness of breath.  Marland Kitchen aspirin EC 81 MG tablet Take 81 mg by mouth daily.  Marland Kitchen atorvastatin (LIPITOR) 80 MG tablet TAKE ONE TABLET BY MOUTH ONCE DAILY AT 6 PM  . cetirizine (ZYRTEC) 10 MG tablet Take 10 mg by mouth daily.    . citalopram (CELEXA) 20 MG tablet Take 1 tablet (20 mg total) by mouth daily. Take 1 tablet by mouth daily  . cyclobenzaprine (FLEXERIL) 10 MG tablet TAKE 1 TABLET BY MOUTH AT  BEDTIME AS NEEDED AND MAY REPEAT DOSE ONE TIME IF NEEDED FOR MUSCLE SPASMS, CAREFUL OF SEDATION  . dicyclomine (BENTYL) 20 MG tablet Take 20 mg by mouth 3 (three) times daily.  . fish oil-omega-3 fatty acids 1000 MG capsule Take 2 g by mouth daily.   . fluticasone (FLONASE) 50 MCG/ACT nasal spray Place 2 sprays into both nostrils daily.  .  fluticasone (FLOVENT HFA) 110 MCG/ACT inhaler Inhale 2 puffs into the lungs 2 (two) times daily.  Marland Kitchen gabapentin (NEURONTIN) 300 MG capsule TAKE ONE CAPSULE BY MOUTH IN THE MORNING AND TWO CAPSULES BY MOUTH AT BEDTIME  . glimepiride (AMARYL) 4 MG tablet Take 0.5 tablets (2 mg total) by mouth 2 (two) times daily.  Marland Kitchen glucose blood test strip Use as instructed  . hydrOXYzine (ATARAX/VISTARIL) 25 MG tablet Take 25 mg by mouth every 8 (eight) hours as needed for anxiety.  Marland Kitchen lisinopril (PRINIVIL,ZESTRIL) 10 MG tablet Take 1 tablet (10 mg total) by mouth daily.  . metoprolol succinate (TOPROL-XL) 50 MG 24 hr tablet Take 1 tablet (50 mg total) by mouth daily. Take with or immediately following a meal.  . Multiple Vitamin (MULTIVITAMIN) tablet Take 1 tablet by mouth daily.  Marland Kitchen omeprazole (PRILOSEC) 20 MG capsule Take 1 capsule (20 mg total) by mouth daily.  . ondansetron (ZOFRAN-ODT) 8 MG disintegrating tablet DISSOLVE 1 ON TONGUE EVERY 12 HOURS AS NEEDED FOR NAUSEA  . traMADol (ULTRAM) 50 MG tablet Take one or two by mouth every 4-6 hours PRN moderate pain  . triamcinolone cream (KENALOG) 0.1 % Apply 1 application topically as needed.    Allergies: Adhesive [tape]; Penicillins; Red yeast rice; and Sulfa drugs cross reactors  Social History   Social History  . Marital status: Single    Spouse name: N/A  . Number of children: 0  . Years of education: N/A   Occupational History  . [employer]   Social History Main Topics  . Smoking status: Former Smoker    Types: Cigarettes    Quit date: 1995  . Smokeless tobacco: Never Used     Comment: Quit 20 yrs ago  . Alcohol use No  . Drug use: No  . Sexual activity: Yes    Birth control/ protection: Post-menopausal, Other-see comments     Comment: Monogamous    Other Topics Concern  . Not on file   Social History Narrative   Single. Exercise: No. Education: College.    Family History  Problem Relation Age of Onset  . Heart  disease Mother   . Cancer Mother 65       lung  . Breast cancer Mother        maternal grandmother  . Diabetes Sister        type 2  . Breast cancer Brother   . Cancer Maternal Grandmother        breast  . Leukemia Paternal Grandmother        neoplast  . Aneurysm Paternal Grandfather        abdominal (stomach)  . Heart disease Father   . Colon cancer Neg Hx     Review of Systems: A 12-system review of systems was performed and was negative except as noted in the HPI.  --------------------------------------------------------------------------------------------------  Physical Exam: BP (!) 148/80   Pulse 75   Ht _0  (1.575 m)   Wt 240 lb 12.8 oz (109.2 kg)   SpO2 93%   BMI 44.04 kg/m   General: Morbidly  obese woman, seated comfortably in the exam room. HEENT: No conjunctival pallor or scleral icterus. Moist mucous membranes.  OP clear. Neck: Supple without lymphadenopathy, thyromegaly, JVD, or HJR.  Lungs: Normal work of breathing. Clear to auscultation bilaterally without wheezes or crackles. Heart: Regular rate and rhythm without murmurs, rubs, or gallops.  Unable to assess PMI due to body habitus. Abd: Bowel sounds present. Soft, NT/ND.  Unable to assess HSM due to body habitus. Ext: No lower extremity edema. Radial, PT, and DP pulses are 2+ bilaterally. Skin: Warm and dry without rash.  EKG (06/29/17): NSR without significant abnormalities - personally reviewed.  Lab Results  Component Value Date   WBC WILL FOLLOW 09/15/2016   HGB WILL FOLLOW 09/15/2016   HCT WILL FOLLOW 09/15/2016   MCV WILL FOLLOW 09/15/2016   PLT WILL FOLLOW 09/15/2016    Lab Results  Component Value Date   NA 145 (H) 12/15/2016   K 5.8 (H) 12/15/2016   CL 104 12/15/2016   CO2 28 12/15/2016   BUN 18 12/15/2016   CREATININE 0.92 12/15/2016   GLUCOSE 102 (H) 12/15/2016   ALT 23 09/15/2016    Lab Results  Component Value Date   CHOL 165 12/15/2016   HDL 46 12/15/2016   LDLCALC 76  12/15/2016   LDLDIRECT 195 (H) 04/18/2013   TRIG 216 (H) 12/15/2016   CHOLHDL 3.6 12/15/2016    --------------------------------------------------------------------------------------------------  ASSESSMENT AND PLAN: Coronary artery disease without angina No significant chest pain following CABG.  Ms. Jared has recovered well from her surgery.  We will continue her current medications for secondary prevention, including aspirin, atorvastatin, and metoprolol.  Chronic diastolic heart failure Ms. Hoffert appears grossly euvolemic, though body habitus limits evaluation.  She has NYHA class II symptoms.  Weight is relatively stable.  Blood pressure is somewhat high today, however.  We will plan to repeat a BMP and increase lisinopril, as below.  Ms. Yazdani should continue with CPAP.  I encouraged her to exercise and to lose weight, hopefully at least 10 pounds by her next visit in 6 months.  Hypertension Blood pressure is suboptimally controlled today.  I will check a basic metabolic panel today to ensure that her creatinine is remained normal and that her potassium is no longer elevated.  If possible, will increase lisinopril to 20 mg daily and repeat a BMP in about 2 weeks.  Hyperlipidemia Most recent LDL in March was just above goal at 63.  I have encouraged Ms. Esguerra to exercise and lose weight.  If her LDL remains above 70, we will need to consider adding ezetimibe versus a PCSK9 inhibitor.  Sick sinus syndrome Continue routine follow-up with pacemaker clinic and Dr. Caryl Comes.  Follow-up: Return to clinic in 6 months.  Nelva Bush, MD 08/01/2017 4:15 PM

## 2017-08-02 ENCOUNTER — Other Ambulatory Visit: Payer: Self-pay | Admitting: *Deleted

## 2017-08-02 ENCOUNTER — Telehealth: Payer: Self-pay | Admitting: *Deleted

## 2017-08-02 DIAGNOSIS — I10 Essential (primary) hypertension: Secondary | ICD-10-CM

## 2017-08-02 LAB — BASIC METABOLIC PANEL
BUN/Creatinine Ratio: 16 (ref 12–28)
BUN: 13 mg/dL (ref 8–27)
CALCIUM: 10 mg/dL (ref 8.7–10.3)
CO2: 23 mmol/L (ref 20–29)
CREATININE: 0.81 mg/dL (ref 0.57–1.00)
Chloride: 100 mmol/L (ref 96–106)
GFR calc Af Amer: 89 mL/min/{1.73_m2} (ref >59)
GFR calc non Af Amer: 78 mL/min/{1.73_m2} (ref >59)
GLUCOSE: 334 mg/dL — AB (ref 65–99)
Potassium: 4.2 mmol/L (ref 3.5–5.2)
SODIUM: 139 mmol/L (ref 134–144)

## 2017-08-02 MED ORDER — LISINOPRIL 20 MG PO TABS: 20.0000 mg | ORAL_TABLET | Freq: Every day | ORAL | 1 refills | Status: DC

## 2017-08-02 NOTE — Telephone Encounter (Signed)
Notes recorded by Nelva Bush, MD on 08/02/2017 at 8:03 AM EDT Please let Ms. Handler know that her kidney function and potassium are normal. Her blood sugar was very elevated. I recommend that we increase lisinopril to 20 mg daily and recheck a BMP in 2 weeks. She should continue to monitor her blood sugar at home and f/u with her PCP regarding management of her diabetes.

## 2017-08-13 ENCOUNTER — Other Ambulatory Visit: Payer: Self-pay | Admitting: Physician Assistant

## 2017-08-13 DIAGNOSIS — I495 Sick sinus syndrome: Secondary | ICD-10-CM

## 2017-08-13 DIAGNOSIS — I10 Essential (primary) hypertension: Secondary | ICD-10-CM

## 2017-08-13 DIAGNOSIS — F419 Anxiety disorder, unspecified: Secondary | ICD-10-CM

## 2017-08-13 DIAGNOSIS — E1142 Type 2 diabetes mellitus with diabetic polyneuropathy: Secondary | ICD-10-CM

## 2017-08-16 ENCOUNTER — Other Ambulatory Visit: Payer: Self-pay | Admitting: *Deleted

## 2017-08-16 DIAGNOSIS — I10 Essential (primary) hypertension: Secondary | ICD-10-CM

## 2017-08-16 LAB — BASIC METABOLIC PANEL
BUN / CREAT RATIO: 15 (ref 12–28)
BUN: 13 mg/dL (ref 8–27)
CO2: 26 mmol/L (ref 20–29)
CREATININE: 0.89 mg/dL (ref 0.57–1.00)
Calcium: 10.1 mg/dL (ref 8.7–10.3)
Chloride: 96 mmol/L (ref 96–106)
GFR calc Af Amer: 80 mL/min/{1.73_m2} (ref >59)
GFR, EST NON AFRICAN AMERICAN: 69 mL/min/{1.73_m2} (ref >59)
Glucose: 262 mg/dL — ABNORMAL HIGH (ref 65–99)
Potassium: 4 mmol/L (ref 3.5–5.2)
SODIUM: 138 mmol/L (ref 134–144)

## 2017-08-26 NOTE — Telephone Encounter (Signed)
done

## 2017-09-20 ENCOUNTER — Other Ambulatory Visit: Payer: Self-pay | Admitting: Physician Assistant

## 2017-09-20 DIAGNOSIS — E1142 Type 2 diabetes mellitus with diabetic polyneuropathy: Secondary | ICD-10-CM

## 2017-09-20 DIAGNOSIS — I10 Essential (primary) hypertension: Secondary | ICD-10-CM

## 2017-09-20 DIAGNOSIS — I495 Sick sinus syndrome: Secondary | ICD-10-CM

## 2017-09-20 DIAGNOSIS — F419 Anxiety disorder, unspecified: Secondary | ICD-10-CM

## 2017-09-26 ENCOUNTER — Other Ambulatory Visit: Payer: Self-pay | Admitting: Physician Assistant

## 2017-09-26 DIAGNOSIS — E1142 Type 2 diabetes mellitus with diabetic polyneuropathy: Secondary | ICD-10-CM

## 2017-09-28 ENCOUNTER — Telehealth: Payer: Self-pay | Admitting: Cardiology

## 2017-09-28 ENCOUNTER — Ambulatory Visit (INDEPENDENT_AMBULATORY_CARE_PROVIDER_SITE_OTHER): Payer: Self-pay | Admitting: *Deleted

## 2017-09-28 DIAGNOSIS — I495 Sick sinus syndrome: Secondary | ICD-10-CM

## 2017-09-28 NOTE — Telephone Encounter (Signed)
LMOVM reminding pt to send remote transmission.   

## 2017-09-29 ENCOUNTER — Encounter: Payer: Self-pay | Admitting: Cardiology

## 2017-09-30 ENCOUNTER — Encounter: Payer: Self-pay | Admitting: Cardiology

## 2017-09-30 NOTE — Progress Notes (Signed)
Remote pacemaker transmission.   

## 2017-10-06 LAB — CUP PACEART REMOTE DEVICE CHECK
Battery Voltage: 2.78 V
Brady Statistic AP VS Percent: 0 %
Brady Statistic AS VP Percent: 0 %
Brady Statistic AS VS Percent: 100 %
Date Time Interrogation Session: 20181221144818
Implantable Lead Implant Date: 20091201
Implantable Lead Location: 753859
Implantable Lead Model: 5076
Lead Channel Impedance Value: 580 Ohm
Lead Channel Pacing Threshold Amplitude: 0.5 V
Lead Channel Pacing Threshold Amplitude: 0.625 V
Lead Channel Pacing Threshold Pulse Width: 0.4 ms
Lead Channel Pacing Threshold Pulse Width: 0.4 ms
MDC IDC LEAD IMPLANT DT: 20091201
MDC IDC LEAD LOCATION: 753860
MDC IDC MSMT BATTERY IMPEDANCE: 640 Ohm
MDC IDC MSMT BATTERY REMAINING LONGEVITY: 91 mo
MDC IDC MSMT LEADCHNL RA IMPEDANCE VALUE: 418 Ohm
MDC IDC PG IMPLANT DT: 20091201
MDC IDC SET LEADCHNL RA PACING AMPLITUDE: 2 V
MDC IDC SET LEADCHNL RV PACING AMPLITUDE: 2.5 V
MDC IDC SET LEADCHNL RV PACING PULSEWIDTH: 0.4 ms
MDC IDC SET LEADCHNL RV SENSING SENSITIVITY: 5.6 mV
MDC IDC STAT BRADY AP VP PERCENT: 0 %

## 2017-10-15 IMAGING — DX DG CHEST 2V
2 series · 2 of 2 positions shown · non-contrast
Comparison: 06/27/2014

CLINICAL DATA: SOB x 1 week

EXAM:
CHEST - 2 VIEW

[chest pa]
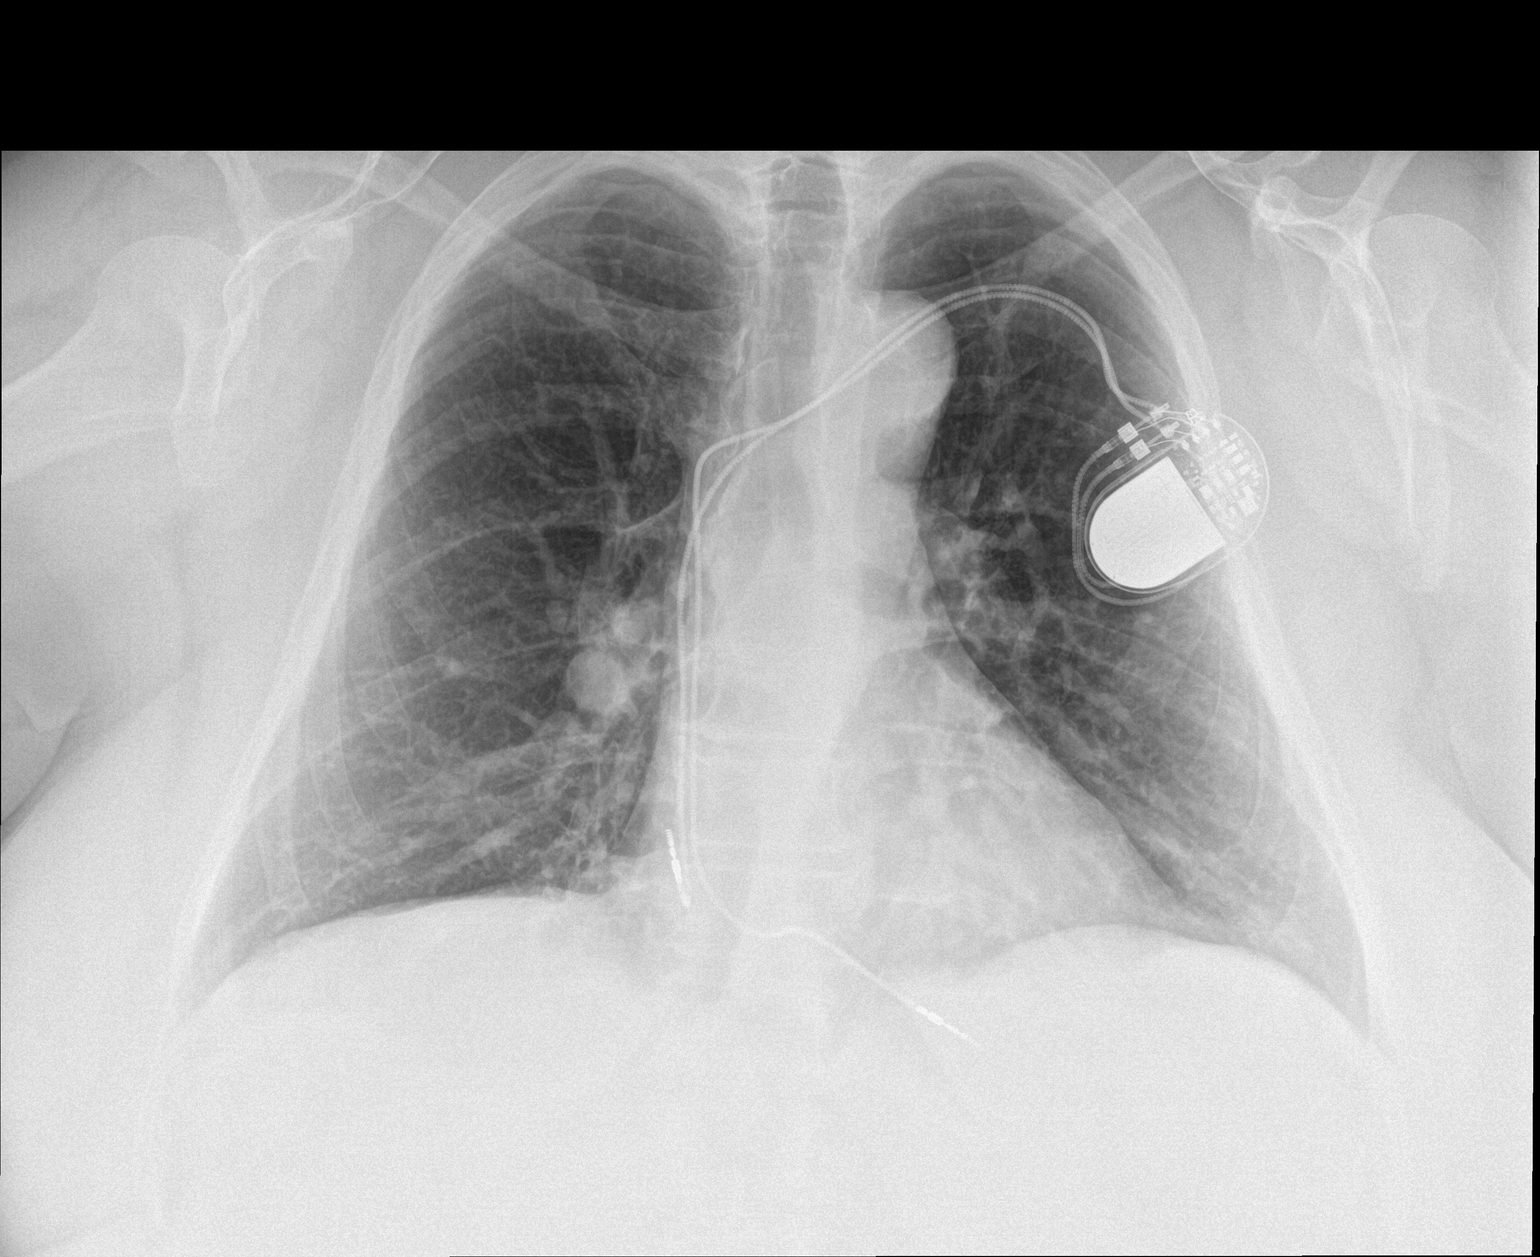

[chest lat]
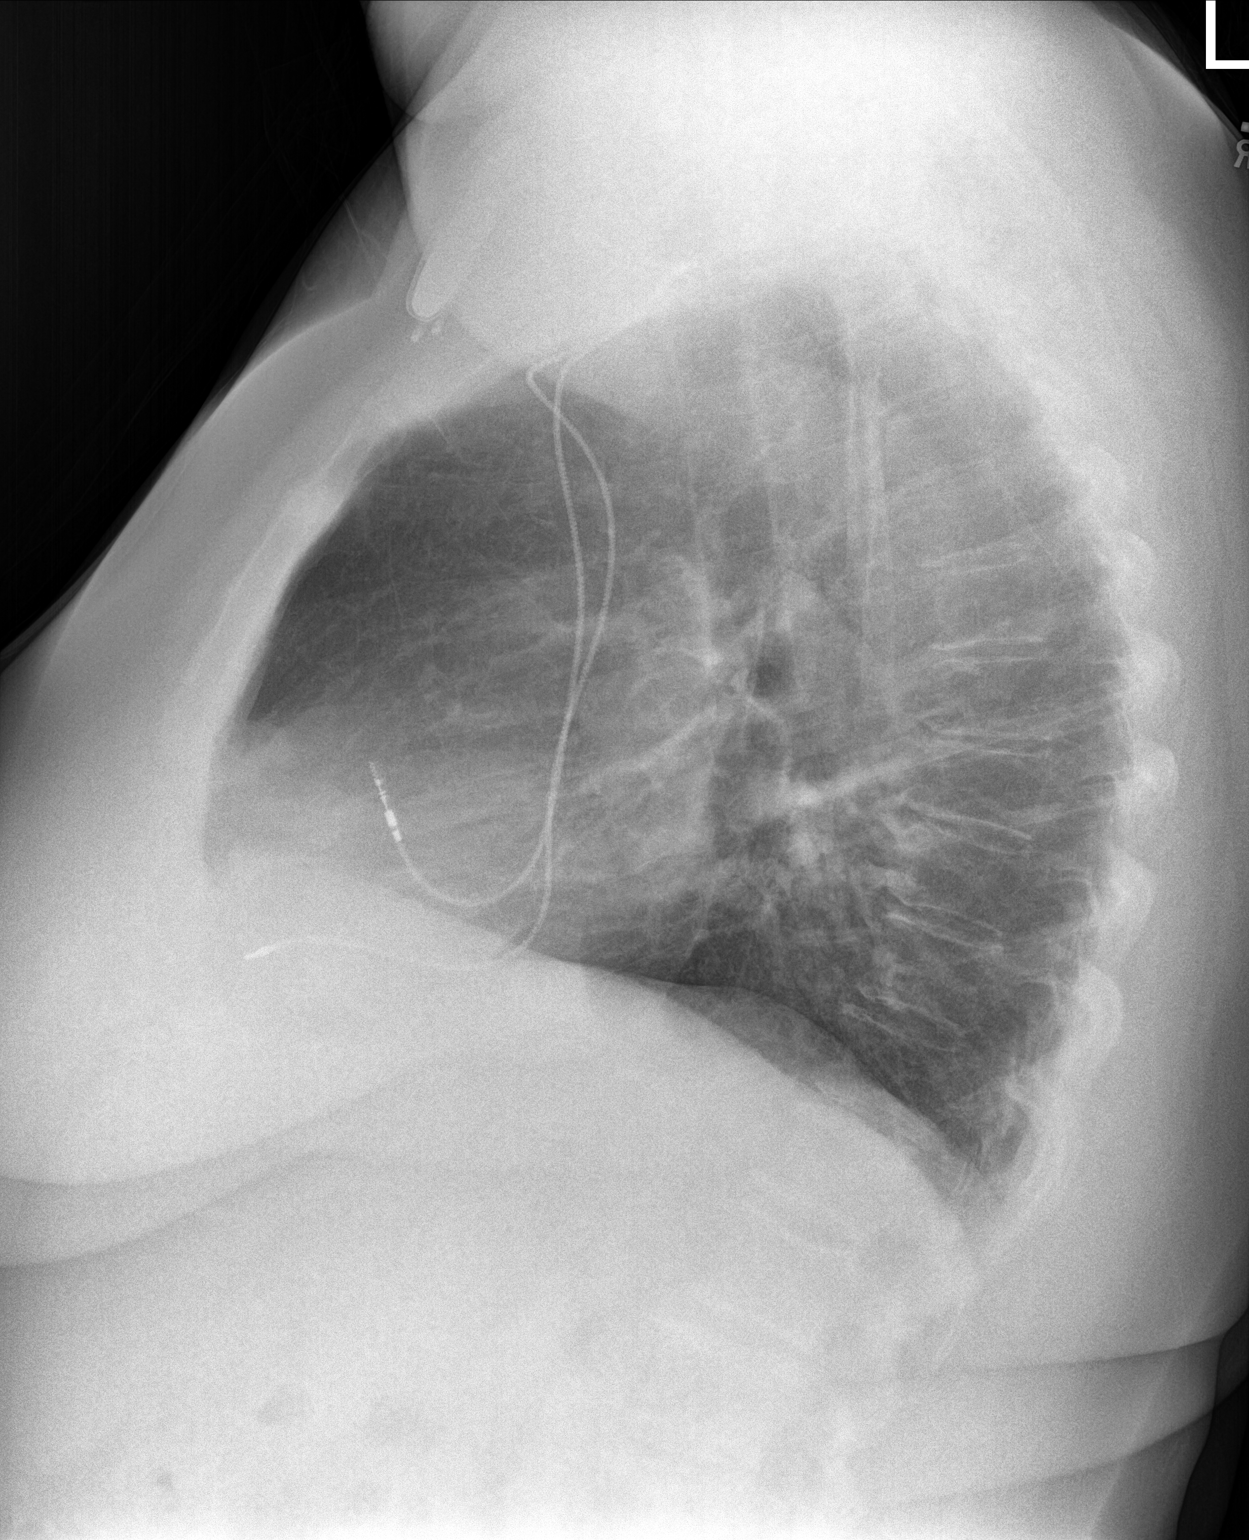

[2 of 2 positions shown; findings below may reference images not displayed]

FINDINGS: Stable left subclavian pacemaker.  No pneumothorax.  Lungs clear.

Heart size and mediastinal contours are within normal limits.

No effusion.

Visualized bones unremarkable.
IMPRESSION: No acute cardiopulmonary disease.

## 2017-10-18 IMAGING — CR DG CHEST 1V PORT
1 series · 1 of 1 positions shown · non-contrast
Comparison: Chest radiograph from one day prior.

CLINICAL DATA: Status post 3 vessel CABG

EXAM:
PORTABLE CHEST 1 VIEW

[AP]
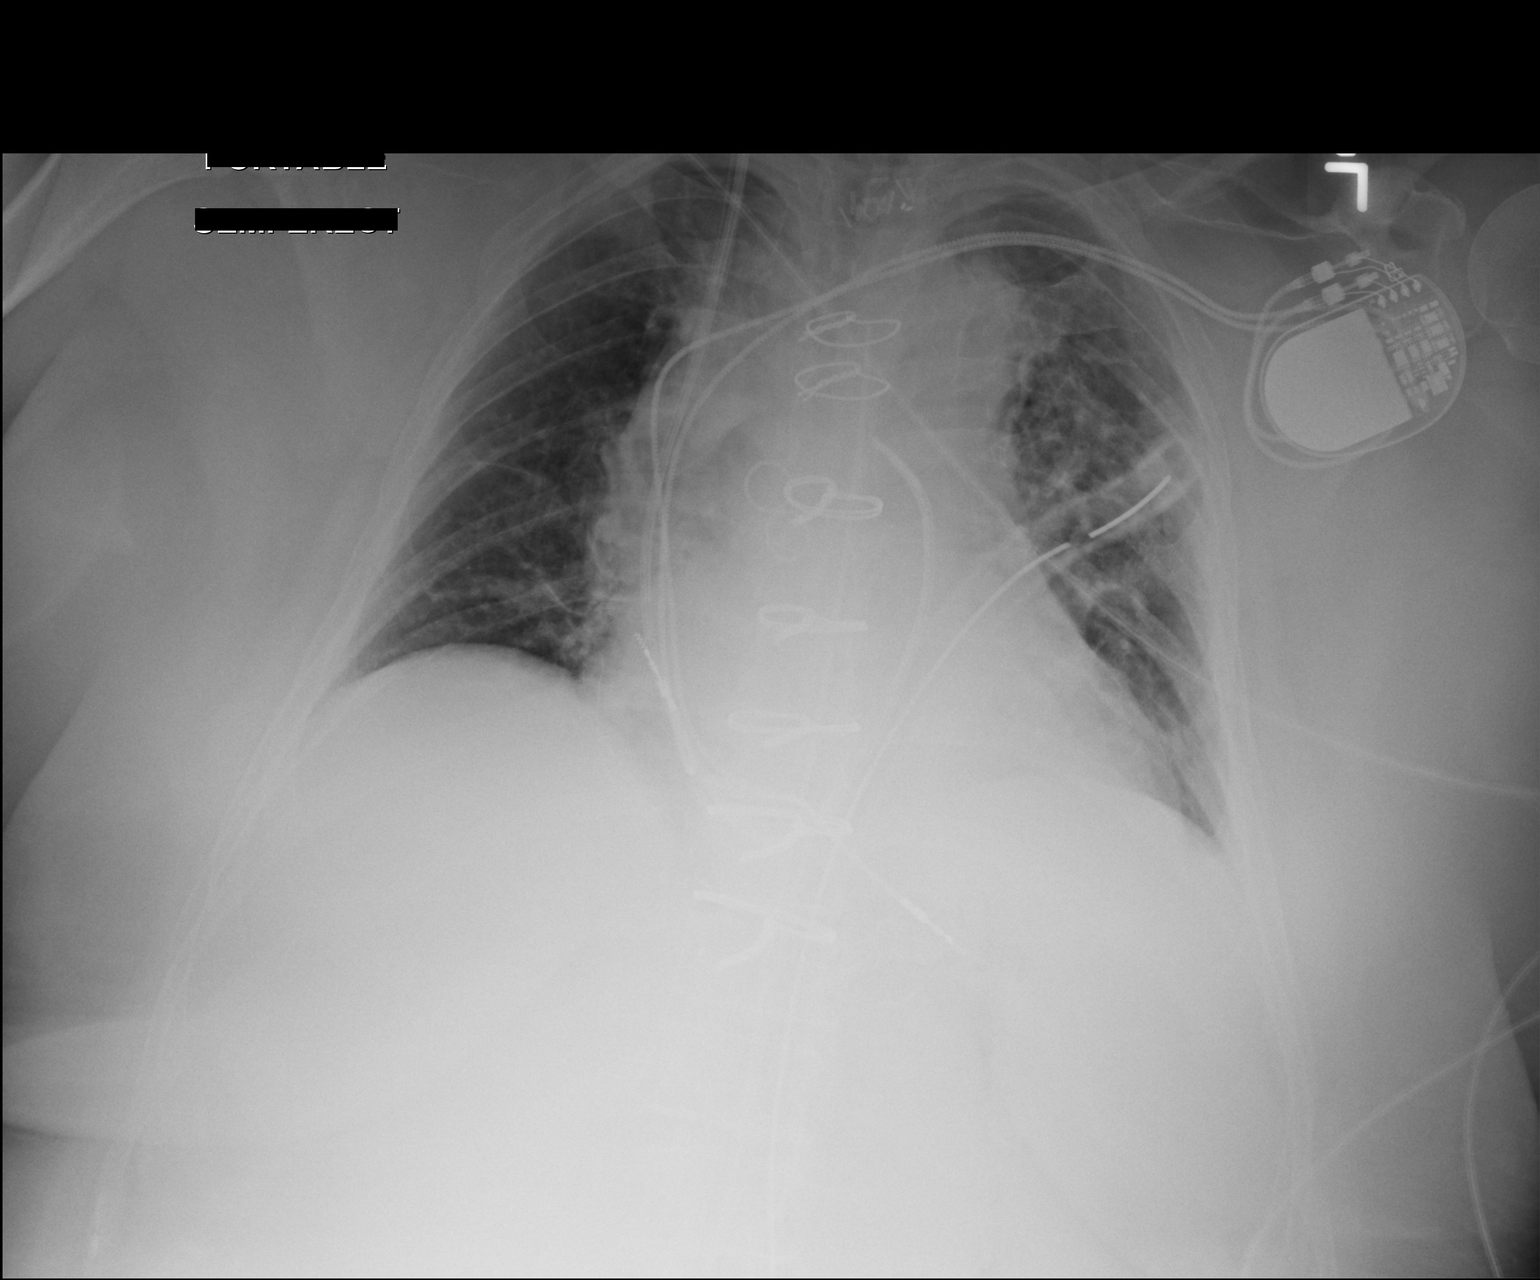

[1 of 1 positions shown; findings below may reference images not displayed]

FINDINGS: Right internal jugular Swan-Ganz catheter terminates in the main
pulmonary artery. Left chest tube is stable in position with the tip
in the upper peripheral left pleural space. Median sternotomy wires
are aligned and intact. CABG clips overlie the mediastinum. Stable 2
lead left subclavian pacemaker configuration. Stable
cardiomediastinal silhouette with mild cardiomegaly. No
pneumothorax. No pleural effusion. No overt pulmonary edema.
Curvilinear opacities throughout the left lung appears stable.
IMPRESSION: 1. Support structures as described .  No pneumothorax.
2. Stable curvilinear left lung opacities, favor atelectasis.
3. Stable mild cardiomegaly without overt pulmonary edema.

## 2017-10-19 IMAGING — DX DG CHEST 1V PORT
1 series · 1 of 1 positions shown · non-contrast
Comparison: July 23, 2016

CLINICAL DATA: Recent CABG

EXAM:
PORTABLE CHEST 1 VIEW

[chest ap]
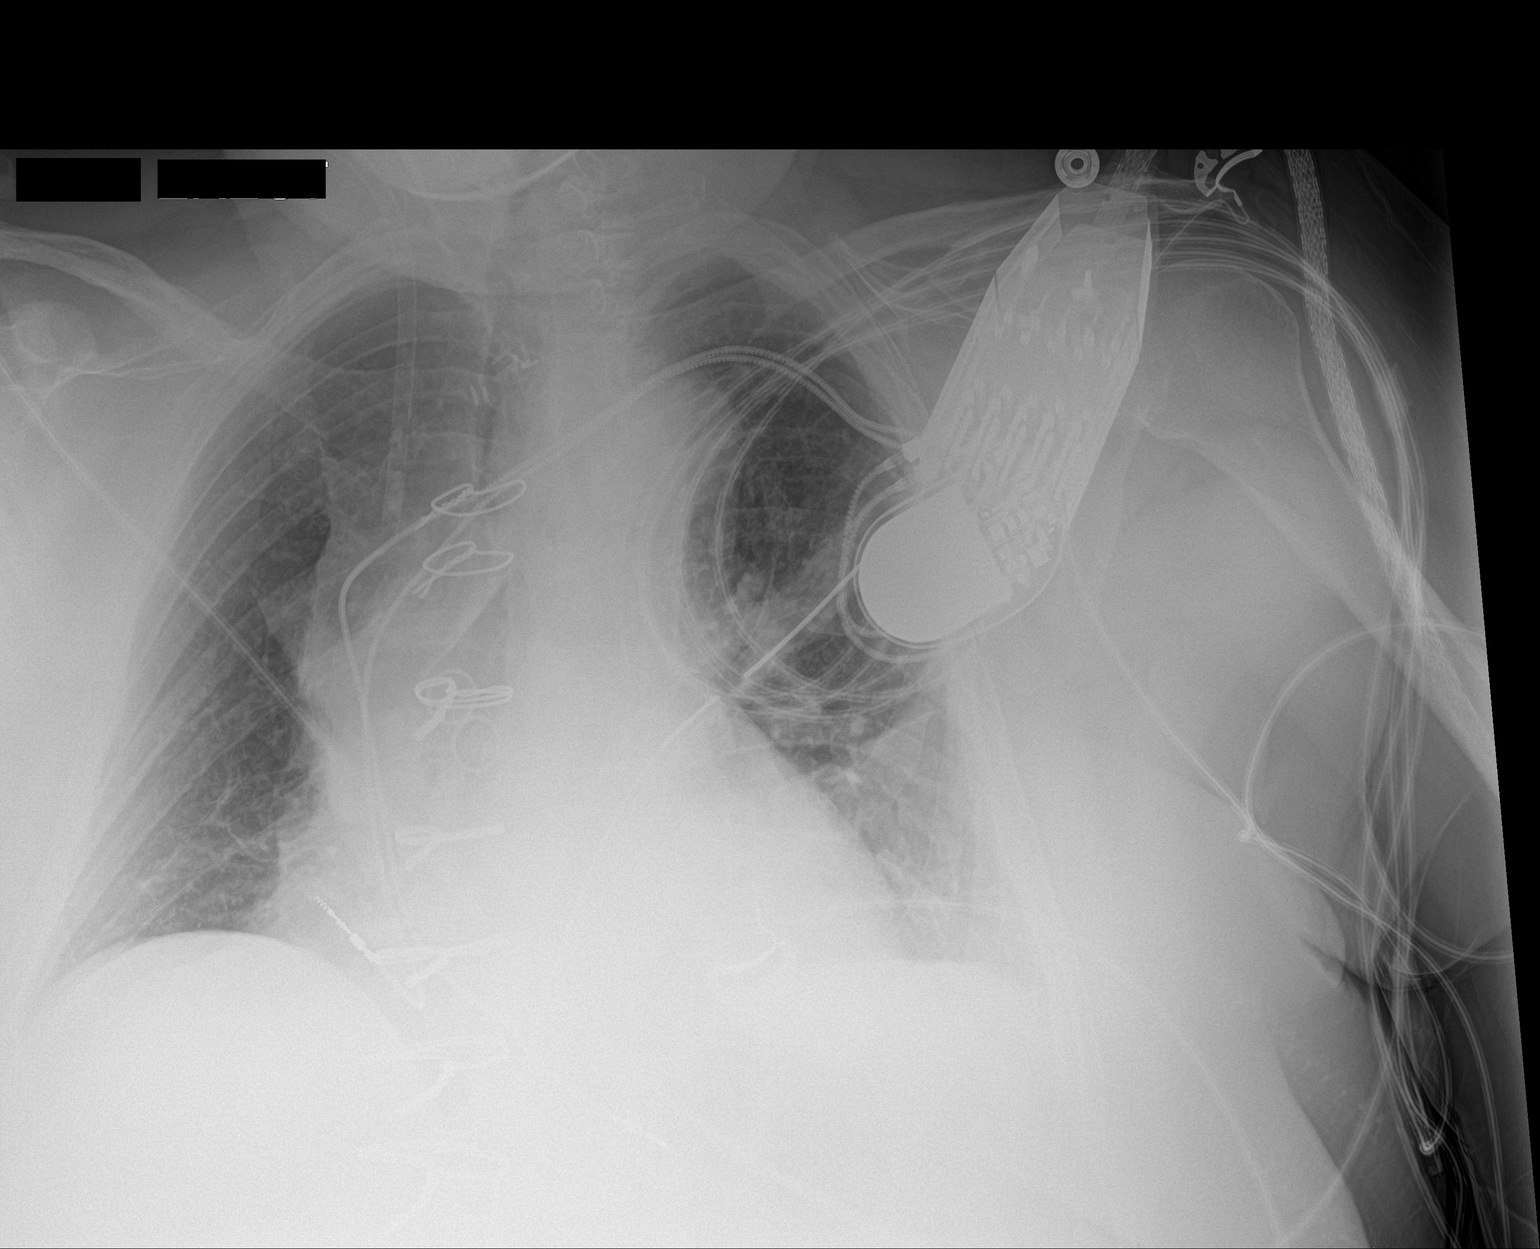

[1 of 1 positions shown; findings below may reference images not displayed]

FINDINGS: Stable cardiomegaly. The PA catheter has been removed with only a
right IJ sheath remaining. Sternotomy wires are intact. A pacemaker
is again identified. A left-sided chest tube remains. No
pneumothorax. Rectangular lucency projected over the mediastinum may
be artifactual due to rotation. Air within the incision is possible.
Recommend attention on follow-up. No other interval changes or acute
abnormalities.
IMPRESSION: Rectangular lucency projected over the mediastinum is probably
unchanged given difference in positioning. This may be artifactual.
Recommend attention on follow-up. No other change.

## 2017-10-20 ENCOUNTER — Ambulatory Visit (INDEPENDENT_AMBULATORY_CARE_PROVIDER_SITE_OTHER): Payer: Self-pay | Admitting: Physician Assistant

## 2017-10-20 ENCOUNTER — Encounter: Payer: Self-pay | Admitting: Physician Assistant

## 2017-10-20 ENCOUNTER — Ambulatory Visit: Payer: Self-pay | Admitting: Physician Assistant

## 2017-10-20 VITALS — BP 144/82 | HR 87 | Temp 97.5°F | Resp 16 | Ht 62.0 in | Wt 234.0 lb

## 2017-10-20 DIAGNOSIS — G629 Polyneuropathy, unspecified: Secondary | ICD-10-CM

## 2017-10-20 DIAGNOSIS — E1142 Type 2 diabetes mellitus with diabetic polyneuropathy: Secondary | ICD-10-CM

## 2017-10-20 IMAGING — DX DG CHEST 2V
2 series · 2 of 2 positions shown · non-contrast
Comparison: 07/24/2016.

CLINICAL DATA: CABG.

EXAM:
CHEST  2 VIEW

[chest pa]
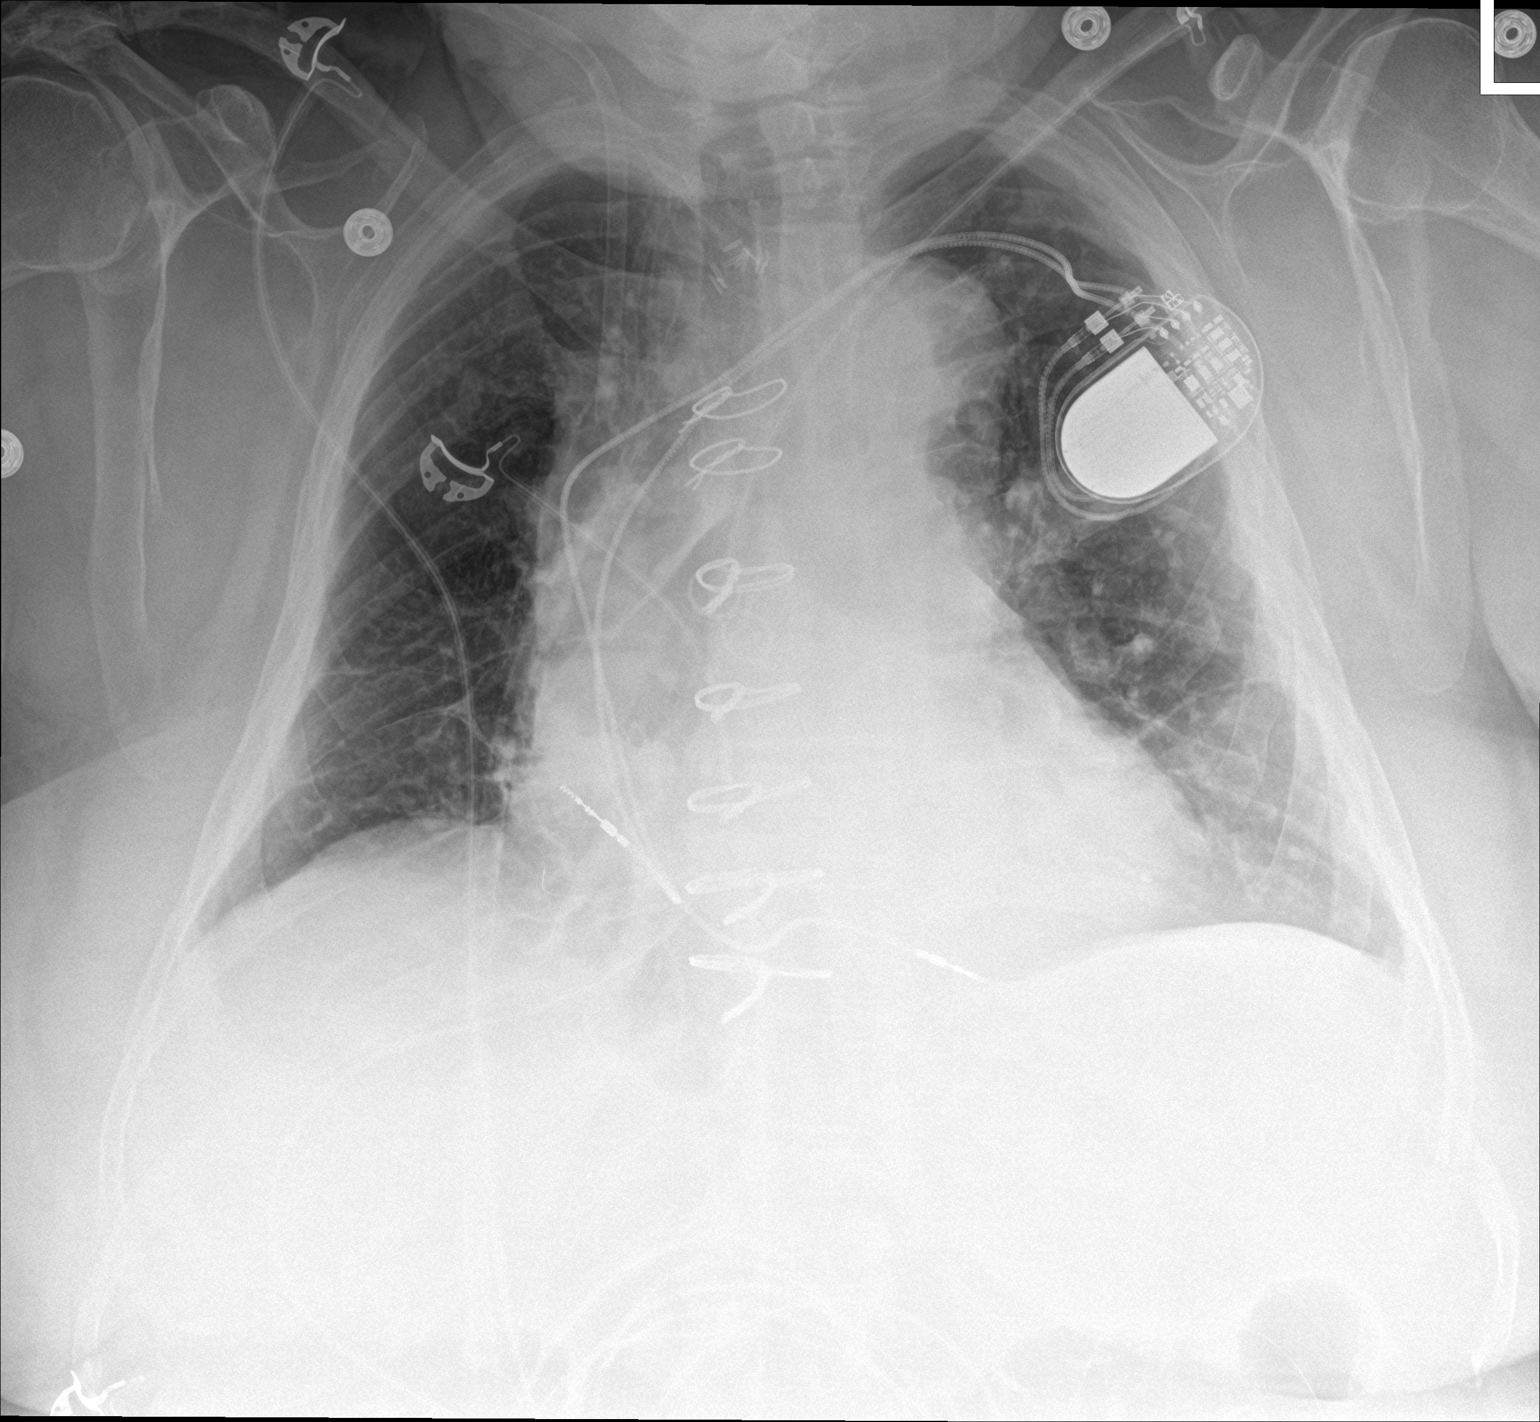

[chest lat]
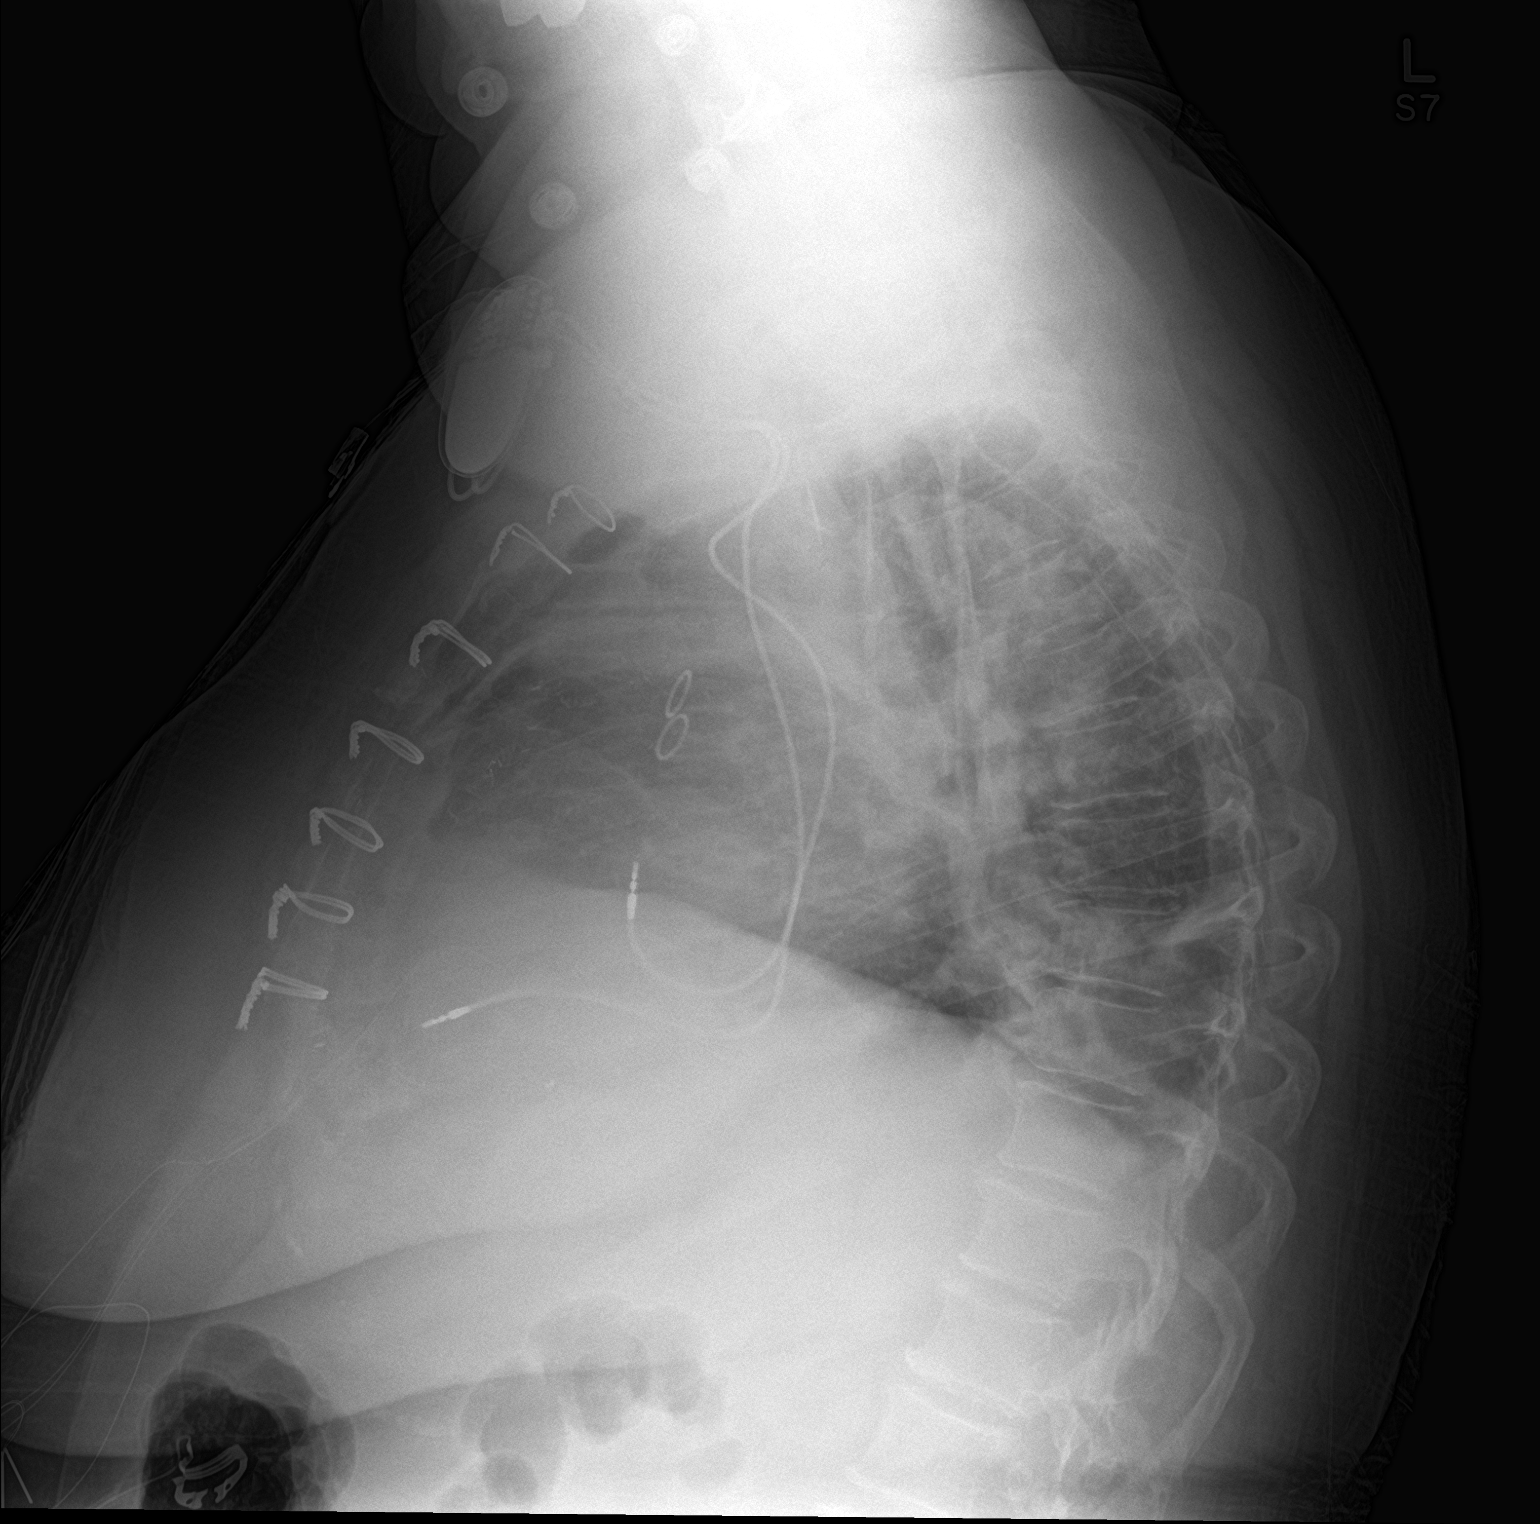

[2 of 2 positions shown; findings below may reference images not displayed]

FINDINGS: The cardio pericardial silhouette is enlarged. Mild vascular
congestion with underlying chronic interstitial changes noted. No
overt pulmonary edema, focal airspace consolidation, or substantial
pleural effusion. Right IJ sheath remains in place. Rectangular
lucency projecting over the inferior right hilum not substantially
changed in the interval. Telemetry leads overlie the chest.
IMPRESSION: Cardiomegaly with mild vascular congestion.

Previous see described rectangular lucency overlying the medial
right hilum is stable and indeterminate. Continued attention on
follow-up recommended.

## 2017-10-20 MED ORDER — ATORVASTATIN CALCIUM 80 MG PO TABS: ORAL_TABLET | ORAL | 3 refills | Status: DC

## 2017-10-20 MED ORDER — GABAPENTIN 300 MG PO CAPS: ORAL_CAPSULE | ORAL | 1 refills | Status: DC

## 2017-10-20 MED ORDER — TRAMADOL HCL 50 MG PO TABS: 50.0000 mg | ORAL_TABLET | Freq: Every evening | ORAL | 2 refills | Status: DC | PRN

## 2017-10-20 MED ORDER — VALACYCLOVIR HCL 1 G PO TABS: 1000.0000 mg | ORAL_TABLET | Freq: Three times a day (TID) | ORAL | 0 refills | Status: DC

## 2017-10-20 MED ORDER — GLIMEPIRIDE 4 MG PO TABS: ORAL_TABLET | ORAL | 1 refills | Status: DC

## 2017-10-20 NOTE — Progress Notes (Signed)
PRIMARY CARE AT [facility] [address], [place] [address] [phone]  Date:  10/20/2017   Name:  [name] [name]   DOB:  [birth year]   MRN:  [id number]  PCP:  Joretta Bachelor, PA    History of Present Illness:  [name] [name] is a 62 y.o. female patient who presents to PCP with  Chief Complaint  Patient presents with  . Medication Refill    liptitor, glimepiride, gabepentin,glucose strip, 90 refills  . Rash    back, stomach, x 4 days     Diabetes: 114-140s.  2mg  twice per day.   There has been increased numbness and tingling to the feet.  More prevalent at night.  No diarrhea or polyuria.  No abnormal tremulous, or fatigue.   She is compliant on the lipitor, glimepiride, and gabapentin.   She is wanting a refill of the tramadol.  This works with her joint pains at night.  She states that since the voltaren was discontinued, she has had increased pain to her small joints, particularly the right ring finger.   Patient Active Problem List   Diagnosis Date Noted  . Irritable bowel syndrome 06/28/2017  . S/P CABG x 3 07/22/2016  . Multiple vessel coronary artery disease 07/21/2016  . STEMI involving left circumflex coronary artery ([place]) 07/20/2016  . Asthmatic bronchitis 04/26/2016  . Allergic urticaria 04/26/2016  . Environmental allergies 04/26/2016  . Neck pain 04/26/2016  . Anxiety 04/26/2016  . Sinoatrial node dysfunction (HCC) 04/26/2016  . Pacemaker Medtronic   . Ventricular tachycardia, nonsustained-during exercise testing 03/07/2013  . OSA (obstructive sleep apnea) 05/25/2011  . Hx of adenomatous colonic polyps 05/25/2011  . Migraine 05/25/2011  . Diabetes mellitus type 2, uncontrolled, with complications ([place]) 46/96/2952  . GERD (gastroesophageal reflux disease) 05/25/2011  . NAFLD (nonalcoholic fatty liver disease) 05/25/2011  . Hyperlipidemia LDL goal <70 05/25/2011  . Essential hypertension 09/16/2009  . DIASTOLIC HEART FAILURE, CHRONIC 09/16/2009    Past  Medical History:  Diagnosis Date  . Allergy   . Anal fissure   . Anxiety   . Arthritis   . Asthma   . Cataract 2010    left  . Colon polyps    diverticulosis  03-2011/2005  . Depression   . Diabetes mellitus 2012   T2DM  . Diverticulosis   . Fatty liver 04/2011  . GERD (gastroesophageal reflux disease)   . Hyperlipidemia   . Hypertension severe   . Migraine   . Obesity   . OSA (obstructive sleep apnea)    C-Pap  . Pacemaker Medtronic   . Sinoatrial node dysfunction (HCC)    syncope    Past Surgical History:  Procedure Laterality Date  . ABDOMINAL HYSTERECTOMY  2000   total  . CARDIAC CATHETERIZATION N/A 07/20/2016   Procedure: Left Heart Cath and Coronary Angiography;  Surgeon: Peter M Martinique, MD;  Location: [facility];  Service: Cardiovascular;  Laterality: N/A;  . CORONARY ARTERY BYPASS GRAFT N/A 07/22/2016   Procedure: CORONARY ARTERY BYPASS GRAFTING (CABG) x three , using left internal mammary artery and right leg greater saphenous vein harvested endoscopically;  Surgeon: Ivin Poot, MD;  Location: [facility];  Service: Open Heart Surgery;  Laterality: N/A;  . EYE SURGERY Right 2010  . OOPHORECTOMY    . PACEMAKER PLACEMENT    . T/A right knee    . TEE WITHOUT CARDIOVERSION N/A 07/22/2016   Procedure: TRANSESOPHAGEAL ECHOCARDIOGRAM (TEE);  Surgeon: Ivin Poot, MD;  Location: [facility];  Service: Open Heart Surgery;  Laterality: N/A;  . TONSILLECTOMY      Social History   Tobacco Use  . Smoking status: Former Smoker    Types: Cigarettes    Last attempt to quit: 1995    Years since quitting: 24.0  . Smokeless tobacco: Never Used  . Tobacco comment: Quit 20 yrs ago  Substance Use Topics  . Alcohol use: No    Alcohol/week: 0.0 oz  . Drug use: No    Family History  Problem Relation Age of Onset  . Heart disease Mother   . Cancer Mother 28       lung  . Breast cancer Mother        maternal grandmother  . Diabetes Sister        type 2  . Breast  cancer Brother   . Cancer Maternal Grandmother        breast  . Leukemia Paternal Grandmother        neoplast  . Aneurysm Paternal Grandfather        abdominal (stomach)  . Heart disease Father   . Colon cancer Neg Hx     Allergies  Allergen Reactions  . Adhesive [Tape] Other (See Comments)    SKIN BLISTERS  . Penicillins     Swelling   . Red Yeast Rice Hives  . Sulfa Drugs Cross Reactors Hives    Medication list has been reviewed and updated.  Current Outpatient Medications on File Prior to Visit  Medication Sig Dispense Refill  . albuterol (PROVENTIL HFA;VENTOLIN HFA) 108 (90 Base) MCG/ACT inhaler Inhale into the lungs every 6 (six) hours as needed for wheezing or shortness of breath.    Marland Kitchen aspirin EC 81 MG tablet Take 81 mg by mouth daily.    Marland Kitchen atorvastatin (LIPITOR) 80 MG tablet TAKE ONE TABLET BY MOUTH ONCE DAILY AT 6 PM 30 tablet 10  . cetirizine (ZYRTEC) 10 MG tablet Take 10 mg by mouth daily.      . citalopram (CELEXA) 20 MG tablet Take 1 tablet (20 mg total) by mouth daily. Office visit needed 30 tablet 0  . cyclobenzaprine (FLEXERIL) 10 MG tablet TAKE 1 TABLET BY MOUTH AT BEDTIME AS NEEDED AND MAY REPEAT DOSE ONE TIME IF NEEDED FOR MUSCLE SPASMS, CAREFUL OF SEDATION 30 tablet 1  . dicyclomine (BENTYL) 20 MG tablet Take 20 mg by mouth 3 (three) times daily.    . fish oil-omega-3 fatty acids 1000 MG capsule Take 2 g by mouth daily.     . fluticasone (FLONASE) 50 MCG/ACT nasal spray Place 2 sprays into both nostrils daily. 48 g 3  . fluticasone (FLOVENT HFA) 110 MCG/ACT inhaler Inhale 2 puffs into the lungs 2 (two) times daily. 1 Inhaler 12  . gabapentin (NEURONTIN) 300 MG capsule TAKE 1 CAPSULE BY MOUTH IN THE MORNING AND 2 CAPSULES AT BEDTIME Office visit needed 60 capsule 0  . gabapentin (NEURONTIN) 300 MG capsule TAKE 1 CAPSULE BY MOUTH IN THE MORNING AND 2 CAPS AT BEDTIME 90 capsule 0  . glimepiride (AMARYL) 4 MG tablet TAKE 1/2 (ONE-HALF) TABLET BY MOUTH TWICE DAILY  90 tablet 1  . glucose blood test strip Use as instructed 200 each 5  . hydrOXYzine (ATARAX/VISTARIL) 25 MG tablet Take 25 mg by mouth every 8 (eight) hours as needed for anxiety.    Marland Kitchen lisinopril (PRINIVIL,ZESTRIL) 20 MG tablet Take 1 tablet (20 mg total) by mouth daily. 90 tablet 1  . metoprolol succinate (  TOPROL-XL) 50 MG 24 hr tablet Take 1 tablet (50 mg total) by mouth daily. 30 tablet 0  . Multiple Vitamin (MULTIVITAMIN) tablet Take 1 tablet by mouth daily.    Marland Kitchen omeprazole (PRILOSEC) 20 MG capsule Take 1 capsule (20 mg total) by mouth daily. 90 capsule 1  . ondansetron (ZOFRAN-ODT) 8 MG disintegrating tablet DISSOLVE 1 ON TONGUE EVERY 12 HOURS AS NEEDED FOR NAUSEA 30 tablet 3  . traMADol (ULTRAM) 50 MG tablet Take one or two by mouth every 4-6 hours PRN moderate pain 100 tablet 0  . triamcinolone cream (KENALOG) 0.1 % Apply 1 application topically as needed. 30 g 2   No current facility-administered medications on file prior to visit.     ROS ROS otherwise unremarkable unless listed above.  Physical Examination: BP (!) 144/82   Pulse 87   Temp (!) 97.5 F (36.4 C) (Oral)   Resp 16   Ht 5\' 2"  (1.575 m)   Wt 234 lb (106.1 kg)   SpO2 93%   BMI 42.80 kg/m  Ideal Body Weight: Weight in (lb) to have BMI = 25: 136.4  Physical Exam  Constitutional: She is oriented to person, place, and time. She appears well-developed and well-nourished. No distress.  HENT:  Head: Normocephalic and atraumatic.  Right Ear: Tympanic membrane, external ear and ear canal normal.  Left Ear: Tympanic membrane, external ear and ear canal normal.  Nose: Right sinus exhibits no maxillary sinus tenderness and no frontal sinus tenderness. Left sinus exhibits no maxillary sinus tenderness and no frontal sinus tenderness.  Mouth/Throat: Oropharynx is clear and moist. No uvula swelling. No oropharyngeal exudate, posterior oropharyngeal edema or posterior oropharyngeal erythema.  Eyes: Conjunctivae and EOM are  normal. Pupils are equal, round, and reactive to light.  Neck: Normal range of motion. Neck supple. No thyromegaly present.  Cardiovascular: Normal rate, regular rhythm, normal heart sounds and intact distal pulses. Exam reveals no gallop, no distant heart sounds and no friction rub.  No murmur heard. Pulmonary/Chest: Effort normal and breath sounds normal. No respiratory distress. She has no decreased breath sounds. She has no wheezes. She has no rhonchi.  Abdominal: Soft. Bowel sounds are normal. She exhibits no distension and no mass. There is no tenderness.  Musculoskeletal: Normal range of motion. She exhibits no edema or tenderness.  Lymphadenopathy:       Head (right side): No submandibular, no tonsillar, no preauricular and no posterior auricular adenopathy present.       Head (left side): No submandibular, no tonsillar, no preauricular and no posterior auricular adenopathy present.    She has no cervical adenopathy.  Neurological: She is alert and oriented to person, place, and time. No cranial nerve deficit. She exhibits normal muscle tone. Coordination normal.  Skin: Skin is warm and dry. She is not diaphoretic.  No erythema or change in skin along the right side of the area of her abdomen and back which stretches anteriorly under the breast line and around to the back along the dermatome.    Psychiatric: She has a normal mood and affect. Her behavior is normal.     Assessment and Plan: [name] [name] is a 62 y.o. female who is here today for cc of  Chief Complaint  Patient presents with  . Medication Refill    liptitor, glimepiride, gabepentin,glucose strip, 90 refills  . Rash    back, stomach, x 4 days  refill of medication given.  She will return in 3 months.  Treating for shingles  at this time.  Advised precaution and alarming symptoms to warrant follow up.  Hold off on prednisone at this time.  Type 2 diabetes mellitus with diabetic polyneuropathy, without long-term current  use of insulin (HCC) - Plan: glimepiride (AMARYL) 4 MG tablet, atorvastatin (LIPITOR) 80 MG tablet, Hemoglobin A1c, gabapentin (NEURONTIN) 300 MG capsule, valACYclovir (VALTREX) 1000 MG tablet, DISCONTINUED: gabapentin (NEURONTIN) 300 MG capsule  Neuropathy - Plan: gabapentin (NEURONTIN) 300 MG capsule, traMADol (ULTRAM) 50 MG tablet, valACYclovir (VALTREX) 1000 MG tablet  Ivar Drape, PA-C [facility] 1/13/20196:59 PM

## 2017-10-20 NOTE — Patient Instructions (Addendum)
We are starting the valacyclovir.   Shingles Shingles is an infection that causes a painful skin rash and fluid-filled blisters. Shingles is caused by the same virus that causes chickenpox. Shingles only develops in people who:  Have had chickenpox.  Have gotten the chickenpox vaccine. (This is rare.)  The first symptoms of shingles may be itching, tingling, or pain in an area on your skin. A rash will follow in a few days or weeks. The rash is usually on one side of the body in a bandlike or beltlike pattern. Over time, the rash turns into fluid-filled blisters that break open, scab over, and dry up. Medicines may:  Help you manage pain.  Help you recover more quickly.  Help to prevent long-term problems.  Follow these instructions at home: Medicines  Take medicines only as told by your doctor.  Apply an anti-itch or numbing cream to the affected area as told by your doctor. Blister and Rash Care  Take a cool bath or put cool compresses on the area of the rash or blisters as told by your doctor. This may help with pain and itching.  Keep your rash covered with a loose bandage (dressing). Wear loose-fitting clothing.  Keep your rash and blisters clean with mild soap and cool water or as told by your doctor.  Check your rash every day for signs of infection. These include redness, swelling, and pain that lasts or gets worse.  Do not pick your blisters.  Do not scratch your rash. General instructions  Rest as told by your doctor.  Keep all follow-up visits as told by your doctor. This is important.  Until your blisters scab over, your infection can cause chickenpox in people who have never had it or been vaccinated against it. To prevent this from happening, avoid touching other people or being around other people, especially: ? Babies. ? Pregnant women. ? Children who have eczema. ? Elderly people who have transplants. ? People who have chronic illnesses, such as  leukemia or AIDS. Contact a doctor if:  Your pain does not get better with medicine.  Your pain does not get better after the rash heals.  Your rash looks infected. Signs of infection include: ? Redness. ? Swelling. ? Pain that lasts or gets worse. Get help right away if:  The rash is on your face or nose.  You have pain in your face, pain around your eye area, or loss of feeling on one side of your face.  You have ear pain or you have ringing in your ear.  You have loss of taste.  Your condition gets worse. This information is not intended to replace advice given to you by your health care provider. Make sure you discuss any questions you have with your health care provider. Document Released: 03/15/2008 Document Revised: 05/23/2016 Document Reviewed: 07/09/2014 Elsevier Interactive Patient Education  2018 [employer].    IF you received an x-ray today, you will receive an invoice from [facility]. Please contact [facility] at [phone] with questions or concerns regarding your invoice.   IF you received labwork today, you will receive an invoice from [facility]. Please contact [facility] at [phone] with questions or concerns regarding your invoice.   Our billing staff will not be able to assist you with questions regarding bills from these companies.  You will be contacted with the lab results as soon as they are available. The fastest way to get your results is to activate your My Chart account.  Instructions are located on the last page of this paperwork. If you have not heard from [place] regarding the results in 2 weeks, please contact this office.

## 2017-10-21 LAB — HEMOGLOBIN A1C
Est. average glucose Bld gHb Est-mCnc: 220 mg/dL
Hgb A1c MFr Bld: 9.3 % — ABNORMAL HIGH (ref 4.8–5.6)

## 2017-11-04 ENCOUNTER — Other Ambulatory Visit: Payer: Self-pay | Admitting: Physician Assistant

## 2017-11-04 DIAGNOSIS — F419 Anxiety disorder, unspecified: Secondary | ICD-10-CM

## 2017-11-04 DIAGNOSIS — I495 Sick sinus syndrome: Secondary | ICD-10-CM

## 2017-11-04 DIAGNOSIS — I10 Essential (primary) hypertension: Secondary | ICD-10-CM

## 2017-11-04 NOTE — Telephone Encounter (Signed)
Celexa 20 mg Toprol Xl 50 mg   LOV 09/20/2017  Above medication not addressed- but notes on prior refill state patient had to come for appointment- for review

## 2017-11-08 ENCOUNTER — Other Ambulatory Visit: Payer: Self-pay | Admitting: Physician Assistant

## 2017-11-08 ENCOUNTER — Other Ambulatory Visit: Payer: Self-pay | Admitting: *Deleted

## 2017-11-08 ENCOUNTER — Telehealth: Payer: Self-pay | Admitting: Physician Assistant

## 2017-11-08 DIAGNOSIS — I10 Essential (primary) hypertension: Secondary | ICD-10-CM

## 2017-11-08 DIAGNOSIS — I495 Sick sinus syndrome: Secondary | ICD-10-CM

## 2017-11-08 DIAGNOSIS — F419 Anxiety disorder, unspecified: Secondary | ICD-10-CM

## 2017-11-08 MED ORDER — CITALOPRAM HYDROBROMIDE 20 MG PO TABS: 20.0000 mg | ORAL_TABLET | Freq: Every day | ORAL | 0 refills | Status: DC

## 2017-11-08 NOTE — Telephone Encounter (Signed)
Copied from [place] [id number]. Topic: Quick Communication - Rx Refill/Question >> Nov 08, 2017  1:54 PM Sandi Mariscal E, NT wrote: Medication: citalopram (CELEXA) 20 MG tablet and metoprolol succinate (TOPROL-XL) 50 MG 24 hr tablet   Has the patient contacted their pharmacy? Yes    (Agent: If no, request that the patient contact the pharmacy for the refill.)   Preferred Pharmacy (with phone number or street name): [facility], [address] [place] [phone] (Phone) [phone] (Fax)     Agent: Please be advised that RX refills may take up to 3 business days. We ask that you follow-up with your pharmacy.

## 2017-11-09 NOTE — Telephone Encounter (Addendum)
Patient presents in person to clinic to request metoprolol 50mg  refill. Advised that prescription went to pharmacy, but pharmacy has not received prescription. Patient advised to reschedule annual within 30 days for refill of medication. She is scheduled for 11/16/2017 for appt with Colletta [place] English.  Phone call to [facility] on [address]. Spoke with Inez Catalina, PharmD. Metoprolol 50 mg called in.

## 2017-11-15 ENCOUNTER — Other Ambulatory Visit: Payer: Self-pay | Admitting: Physician Assistant

## 2017-11-15 DIAGNOSIS — I10 Essential (primary) hypertension: Secondary | ICD-10-CM

## 2017-11-15 DIAGNOSIS — G629 Polyneuropathy, unspecified: Secondary | ICD-10-CM

## 2017-11-15 DIAGNOSIS — I495 Sick sinus syndrome: Secondary | ICD-10-CM

## 2017-11-15 DIAGNOSIS — F419 Anxiety disorder, unspecified: Secondary | ICD-10-CM

## 2017-11-15 DIAGNOSIS — E1142 Type 2 diabetes mellitus with diabetic polyneuropathy: Secondary | ICD-10-CM

## 2017-11-15 MED ORDER — METOPROLOL SUCCINATE ER 50 MG PO TB24: 50.0000 mg | ORAL_TABLET | Freq: Every day | ORAL | 0 refills | Status: DC

## 2017-11-15 MED ORDER — GABAPENTIN 300 MG PO CAPS: ORAL_CAPSULE | ORAL | 1 refills | Status: DC

## 2017-11-15 MED ORDER — CITALOPRAM HYDROBROMIDE 20 MG PO TABS: 20.0000 mg | ORAL_TABLET | Freq: Every day | ORAL | 0 refills | Status: DC

## 2017-11-15 NOTE — Progress Notes (Unsigned)
2 in the morning and three at night

## 2017-11-16 ENCOUNTER — Ambulatory Visit: Payer: Self-pay | Admitting: Physician Assistant

## 2017-11-28 ENCOUNTER — Telehealth: Payer: Self-pay | Admitting: Physician Assistant

## 2017-11-28 NOTE — Telephone Encounter (Signed)
Copied from [place]. Topic: Quick Communication - See Telephone Encounter >> Nov 28, 2017  1:29 PM Burnis Medin, NT wrote: CRM for notification. See Telephone encounter for: Inez Catalina called and said they received a prescription for traMADol (ULTRAM) 50 MG tablet. Pharmacist said that need to know if the medication is for an acute issue or chronic. They can only give medication for 5 days for an acute issue. She would a call back.   11/28/17.

## 2017-11-28 NOTE — Telephone Encounter (Signed)
[name] [name] from [facility] on [address], calling asking if Tramadol is used for an acute or chronic issue. [name] [name] states if it is for an acute issue, medication can only be given for 5 days. [name] [name] can be reached at [phone].

## 2017-12-01 NOTE — Telephone Encounter (Signed)
Returned call to [place] pt has chronic joint pain - has had Tramadol previously per note 10/20/2017, she returned here for refill.

## 2017-12-07 ENCOUNTER — Encounter: Payer: Self-pay | Admitting: Physician Assistant

## 2017-12-08 ENCOUNTER — Other Ambulatory Visit: Payer: Self-pay | Admitting: Physician Assistant

## 2017-12-08 DIAGNOSIS — I10 Essential (primary) hypertension: Secondary | ICD-10-CM

## 2017-12-08 DIAGNOSIS — I495 Sick sinus syndrome: Secondary | ICD-10-CM

## 2017-12-11 ENCOUNTER — Other Ambulatory Visit: Payer: Self-pay | Admitting: Physician Assistant

## 2017-12-27 ENCOUNTER — Encounter: Payer: Self-pay | Admitting: Physician Assistant

## 2017-12-27 LAB — HM DIABETES EYE EXAM

## 2017-12-29 ENCOUNTER — Telehealth: Payer: Self-pay | Admitting: Cardiology

## 2017-12-29 ENCOUNTER — Ambulatory Visit (INDEPENDENT_AMBULATORY_CARE_PROVIDER_SITE_OTHER): Payer: Self-pay | Admitting: *Deleted

## 2017-12-29 DIAGNOSIS — I495 Sick sinus syndrome: Secondary | ICD-10-CM

## 2017-12-29 NOTE — Telephone Encounter (Signed)
Spoke with pt and reminded pt of remote transmission that is due today. Pt verbalized understanding.   

## 2017-12-30 ENCOUNTER — Encounter: Payer: Self-pay | Admitting: Cardiology

## 2017-12-30 NOTE — Progress Notes (Signed)
Remote pacemaker transmission.   

## 2018-01-18 ENCOUNTER — Encounter: Payer: Self-pay | Admitting: Physician Assistant

## 2018-01-20 LAB — CUP PACEART REMOTE DEVICE CHECK
Battery Impedance: 639 Ohm
Battery Remaining Longevity: 92 mo
Brady Statistic AP VP Percent: 0 %
Implantable Lead Implant Date: 20091201
Implantable Lead Location: 753860
Implantable Lead Model: 5076
Implantable Pulse Generator Implant Date: 20091201
Lead Channel Impedance Value: 412 Ohm
Lead Channel Setting Pacing Amplitude: 2 V
Lead Channel Setting Pacing Amplitude: 2.5 V
Lead Channel Setting Pacing Pulse Width: 0.4 ms
Lead Channel Setting Sensing Sensitivity: 4 mV
MDC IDC LEAD IMPLANT DT: 20091201
MDC IDC LEAD LOCATION: 753859
MDC IDC MSMT BATTERY VOLTAGE: 2.78 V
MDC IDC MSMT LEADCHNL RA PACING THRESHOLD AMPLITUDE: 0.5 V
MDC IDC MSMT LEADCHNL RA PACING THRESHOLD PULSEWIDTH: 0.4 ms
MDC IDC MSMT LEADCHNL RV IMPEDANCE VALUE: 625 Ohm
MDC IDC MSMT LEADCHNL RV PACING THRESHOLD AMPLITUDE: 0.75 V
MDC IDC MSMT LEADCHNL RV PACING THRESHOLD PULSEWIDTH: 0.4 ms
MDC IDC SESS DTM: 20190321201716
MDC IDC STAT BRADY AP VS PERCENT: 0 %
MDC IDC STAT BRADY AS VP PERCENT: 0 %
MDC IDC STAT BRADY AS VS PERCENT: 100 %

## 2018-01-23 ENCOUNTER — Ambulatory Visit: Payer: Self-pay | Admitting: Physician Assistant

## 2018-02-13 ENCOUNTER — Encounter: Payer: Self-pay | Admitting: Internal Medicine

## 2018-02-13 ENCOUNTER — Ambulatory Visit (INDEPENDENT_AMBULATORY_CARE_PROVIDER_SITE_OTHER): Payer: Self-pay | Admitting: Internal Medicine

## 2018-02-13 VITALS — BP 170/96 | HR 75 | Ht 62.0 in | Wt 234.4 lb

## 2018-02-13 DIAGNOSIS — I2510 Atherosclerotic heart disease of native coronary artery without angina pectoris: Secondary | ICD-10-CM

## 2018-02-13 DIAGNOSIS — E782 Mixed hyperlipidemia: Secondary | ICD-10-CM

## 2018-02-13 DIAGNOSIS — I10 Essential (primary) hypertension: Secondary | ICD-10-CM

## 2018-02-13 DIAGNOSIS — R002 Palpitations: Secondary | ICD-10-CM

## 2018-02-13 MED ORDER — METOPROLOL SUCCINATE ER 50 MG PO TB24: 50.0000 mg | ORAL_TABLET | Freq: Two times a day (BID) | ORAL | 3 refills | Status: DC

## 2018-02-13 NOTE — Patient Instructions (Addendum)
Medication Instructions:   INCREASE METOPROLOL SUCCINATE 50mg  twice per day by mouth   -- If you need a refill on your cardiac medications before your next appointment, please call your pharmacy. --  Labwork: LIPID/TSH at patient's convenience   Testing/Procedures: None ordered  Follow-Up: Your physician wants you to follow-up in: 6 months with Dr. Saunders Revel.    Thank you for choosing [facility]!!    Any Other Special Instructions Will Be Listed Below (If Applicable).

## 2018-02-13 NOTE — Progress Notes (Signed)
Follow-up Outpatient Visit Date: 02/13/2018  Primary Care Provider: Joretta Bachelor, PA No address on file  Chief Complaint: Palpitations  HPI:  [name] [name] is a 62 y.o. year-old female with history of coronary artery disease status post CABG (07/2016) with postoperative atrial fibrillation, hypertension, hyperlipidemia, HFpEF, sinus node dysfunction status post permanent pacemaker, and sleep apnea on CPAP, who presents for follow-up of coronary artery disease.  I last saw [name] [name] in October, at which time she was doing well except for chronic exertional dyspnea that had been present for years.  She reported occasional palpitations when she was "very active."   Today, [name] [name] reports that she has been feeling well with the exception of intermittent palpitations and accompanying shortness of breath.  Episodes can happen any time, typically about once a week.  They last a few minutes before resolving spontaneously.  She denies accompanying dizziness.  She has not had any chest pain, orthopnea, PND, or edema.  Home blood pressures typically 130 to 80/95.  She has been compliant with her medications.  She feels like metoprolol has been helping her with the aforementioned palpitations.  --------------------------------------------------------------------------------------------------  Cardiovascular History & Procedures: Cardiovascular Problems:  Coronary artery disease status post CABG (07/2016)  HFpEF  Sinus node dysfunction status post permanent pacemaker  Risk Factors:  Known coronary artery disease, hypertension, hyperlipidemia, and obesity  Cath/PCI:  LHC (07/20/16): The LMCA normal.  LAD with 90% proximal, 80% mid, and 70% distal stenoses as well as 90% stenosis of the ostium of a large septal branch.  LCx with 100% ostial/proximal occlusion with right to left collaterals.  PL branch with 100% occlusion with filling via left-to-right collaterals.  Mid anterior hypokinesis with  LVEF of 50-55%.  CV Surgery:  CABG (07/25/16): LIMA to LAD, SVG to diagonal, and SVG to OM).  EP Procedures and Devices:  Dual-chamber pacemaker (09/10/08, Dr. Caryl Comes): Medtronic  Non-Invasive Evaluation(s):  TTE (07/21/16): Normal LV size and wall thickness.  LVEF 60-65% with normal wall motion.  Grade 1 diastolic dysfunction.  Mild left atrial enlargement.  Normal RV size and function.  Exercise MPI (01/30/13): Low risk study without ischemia.  Poor exercise capacity.  Wide-complex tachycardia during stress (question rate related LBBB).  LVEF 73%.   Recent CV Pertinent Labs: Lab Results  Component Value Date   CHOL 149 02/14/2018   HDL 43 02/14/2018   LDLCALC 77 02/14/2018   LDLDIRECT 195 (H) 04/18/2013   TRIG 144 02/14/2018   CHOLHDL 3.5 02/14/2018   CHOLHDL 6.5 07/21/2016   INR 1.33 07/22/2016   BNP 88.8 07/20/2016   BNP 196.3 (H) 06/27/2014   K 4.0 08/16/2017   MG 2.0 07/23/2016   BUN 13 08/16/2017   CREATININE 0.89 08/16/2017   CREATININE 0.92 07/20/2016    Past medical and surgical history were reviewed and updated in EPIC.  Current Meds  Medication Sig  . albuterol (PROVENTIL HFA;VENTOLIN HFA) 108 (90 Base) MCG/ACT inhaler Inhale into the lungs every 6 (six) hours as needed for wheezing or shortness of breath.  Marland Kitchen aspirin EC 81 MG tablet Take 81 mg by mouth daily.  Marland Kitchen atorvastatin (LIPITOR) 80 MG tablet TAKE ONE TABLET BY MOUTH ONCE DAILY AT 6 PM  . cetirizine (ZYRTEC) 10 MG tablet Take 10 mg by mouth daily.    . citalopram (CELEXA) 20 MG tablet Take 1 tablet (20 mg total) by mouth daily. Office visit needed  . cyclobenzaprine (FLEXERIL) 10 MG tablet TAKE 1 TABLET BY MOUTH AT BEDTIME AS  NEEDED AND MAY REPEAT DOSE ONE TIME IF NEEDED FOR MUSCLE SPASMS, CAREFUL OF SEDATION  . dicyclomine (BENTYL) 20 MG tablet Take 20 mg by mouth 3 (three) times daily.  . fish oil-omega-3 fatty acids 1000 MG capsule Take 2 g by mouth daily.   . fluticasone (FLONASE) 50 MCG/ACT nasal  spray Place 2 sprays into both nostrils daily.  . fluticasone (FLOVENT HFA) 110 MCG/ACT inhaler Inhale 2 puffs into the lungs 2 (two) times daily.  Marland Kitchen gabapentin (NEURONTIN) 300 MG capsule TAKE 2 CAPSULES BY MOUTH IN THE MORNING AND 3 CAPS AT BEDTIME  . glimepiride (AMARYL) 4 MG tablet TAKE 1/2 (ONE-HALF) TABLET BY MOUTH TWICE DAILY  . glucose blood test strip Use as instructed  . hydrOXYzine (ATARAX/VISTARIL) 25 MG tablet Take 25 mg by mouth every 8 (eight) hours as needed for anxiety.  Marland Kitchen lisinopril (PRINIVIL,ZESTRIL) 20 MG tablet Take 20 mg by mouth daily.  . Multiple Vitamin (MULTIVITAMIN) tablet Take 1 tablet by mouth daily.  Marland Kitchen omeprazole (PRILOSEC) 20 MG capsule Take 1 capsule (20 mg total) by mouth daily.  . ondansetron (ZOFRAN-ODT) 8 MG disintegrating tablet DISSOLVE 1 ON TONGUE EVERY 12 HOURS AS NEEDED FOR NAUSEA  . traMADol (ULTRAM) 50 MG tablet Take 1 tablet (50 mg total) by mouth at bedtime as needed.  . triamcinolone cream (KENALOG) 0.1 % Apply 1 application topically as needed.  . valACYclovir (VALTREX) 1000 MG tablet Take 1 tablet (1,000 mg total) by mouth every 8 (eight) hours.  . [DISCONTINUED] metoprolol succinate (TOPROL-XL) 50 MG 24 hr tablet Take 1 tablet (50 mg total) by mouth daily.    Allergies: Adhesive [tape]; Penicillins; Red yeast rice; and Sulfa drugs cross reactors  Social History   Tobacco Use  . Smoking status: Former Smoker    Types: Cigarettes    Last attempt to quit: 1995    Years since quitting: 24.3  . Smokeless tobacco: Never Used  . Tobacco comment: Quit 20 yrs ago  Substance Use Topics  . Alcohol use: No    Alcohol/week: 0.0 oz  . Drug use: No    Family History  Problem Relation Age of Onset  . Heart disease Mother   . Cancer Mother 13       lung  . Breast cancer Mother        maternal grandmother  . Diabetes Sister        type 2  . Breast cancer Brother   . Cancer Maternal Grandmother        breast  . Leukemia Paternal Grandmother          neoplast  . Aneurysm Paternal Grandfather        abdominal (stomach)  . Heart disease Father   . Colon cancer Neg Hx     Review of Systems: A 12-system review of systems was performed and was negative except as noted in the HPI.  --------------------------------------------------------------------------------------------------  Physical Exam: BP (!) 170/96   Pulse 75   Ht 5\' 2"  (1.575 m)   Wt 234 lb 6.4 oz (106.3 kg)   BMI 42.87 kg/m   General: Morbidly obese woman, seated comfortably in the exam room. HEENT: No conjunctival pallor or scleral icterus. Moist mucous membranes.  OP clear. Neck: Supple without lymphadenopathy, thyromegaly, JVD, or HJR. Lungs: Normal work of breathing. Clear to auscultation bilaterally without wheezes or crackles. Heart: Regular rate and rhythm without murmurs, rubs, or gallops.  Unable to assess PMI due to body habitus. Abd: Bowel sounds present. Soft,  NT/ND.  Unable to assess HSM due to body habitus. Ext: No lower extremity edema. Radial, PT, and DP pulses are 2+ bilaterally. Skin: Warm and dry without rash.  EKG: Normal sinus rhythm without abnormalities.  Lab Results  Component Value Date   WBC WILL FOLLOW 09/15/2016   HGB WILL FOLLOW 09/15/2016   HCT WILL FOLLOW 09/15/2016   MCV WILL FOLLOW 09/15/2016   PLT WILL FOLLOW 09/15/2016    Lab Results  Component Value Date   NA 138 08/16/2017   K 4.0 08/16/2017   CL 96 08/16/2017   CO2 26 08/16/2017   BUN 13 08/16/2017   CREATININE 0.89 08/16/2017   GLUCOSE 262 (H) 08/16/2017   ALT 23 09/15/2016    Lab Results  Component Value Date   CHOL 149 02/14/2018   HDL 43 02/14/2018   LDLCALC 77 02/14/2018   LDLDIRECT 195 (H) 04/18/2013   TRIG 144 02/14/2018   CHOLHDL 3.5 02/14/2018    --------------------------------------------------------------------------------------------------  ASSESSMENT AND PLAN: Palpitations This has been a long-standing issue for Ms. Kuhrt.  I have  personally reviewed her most recent device interrogation, which did not show any sustained arrhythmias, though PACs and PVCs were noted.  It is possible that some tachyarrhythmias may not be detected, as the monitor zone begins at 180 bpm.  We have agreed to increase metoprolol succinate to 50 mg twice daily.  I will also touch base with Dr. Caryl Comes to see if he thinks it would be worthwhile to adjust the monitor zone on Ms. Vanbergen's pacemaker.  Alternatively, we could place a 14-day event monitor to assess for arrhythmias.  Coronary artery disease without angina Ms. Beilke is doing well without recurrent chest pain.  We will increase metoprolol, as above.  Otherwise, I will not make any changes to her regimen for secondary prevention, including aspirin and atorvastatin.  Hypertension Blood pressure suboptimally controlled today.  We will increase metoprolol succinate to 50 mg twice daily.  Continue lisinopril 20 mg daily.  Hyperlipidemia Most lipid panel was in 12/2016, with an LDL just above goal at 76.  We will repeat a lipid panel today and continue atorvastatin 80 mg daily plus fish oil.  Morbid obesity BMI greater than 40 with multiple comorbidities (coronary artery disease, hypertension, hyperlipidemia, and diabetes mellitus).  We discussed the importance of weight loss through diet and exercise.  Follow-up: Return to clinic in 6 months.  Nelva Bush, MD 02/14/2018 9:04 PM

## 2018-02-14 ENCOUNTER — Other Ambulatory Visit: Payer: Self-pay

## 2018-02-14 ENCOUNTER — Other Ambulatory Visit: Payer: Self-pay | Admitting: Internal Medicine

## 2018-02-14 ENCOUNTER — Encounter: Payer: Self-pay | Admitting: Internal Medicine

## 2018-02-14 DIAGNOSIS — R002 Palpitations: Secondary | ICD-10-CM | POA: Insufficient documentation

## 2018-02-14 DIAGNOSIS — I2510 Atherosclerotic heart disease of native coronary artery without angina pectoris: Secondary | ICD-10-CM | POA: Insufficient documentation

## 2018-02-14 DIAGNOSIS — E785 Hyperlipidemia, unspecified: Secondary | ICD-10-CM

## 2018-02-14 DIAGNOSIS — I25119 Atherosclerotic heart disease of native coronary artery with unspecified angina pectoris: Secondary | ICD-10-CM | POA: Insufficient documentation

## 2018-02-14 MED ORDER — METOPROLOL SUCCINATE ER 50 MG PO TB24: 50.0000 mg | ORAL_TABLET | Freq: Two times a day (BID) | ORAL | 3 refills | Status: DC

## 2018-02-15 LAB — LIPID PANEL
CHOLESTEROL TOTAL: 149 mg/dL (ref 100–199)
Chol/HDL Ratio: 3.5 ratio (ref 0.0–4.4)
HDL: 43 mg/dL (ref >39)
LDL Calculated: 77 mg/dL (ref 0–99)
TRIGLYCERIDES: 144 mg/dL (ref 0–149)
VLDL CHOLESTEROL CAL: 29 mg/dL (ref 5–40)

## 2018-02-15 LAB — TSH: TSH: 1.78 u[IU]/mL (ref 0.450–4.500)

## 2018-02-16 ENCOUNTER — Other Ambulatory Visit: Payer: Self-pay

## 2018-02-16 ENCOUNTER — Telehealth: Payer: Self-pay

## 2018-02-16 DIAGNOSIS — E785 Hyperlipidemia, unspecified: Secondary | ICD-10-CM

## 2018-02-16 NOTE — Progress Notes (Signed)
Spoke with patient and gave her results and recommendations.  She said that she does not do well with Ezetimibe.  The only one she tolerates of all statins is Atorvastatin.  Please advise, thank you

## 2018-02-16 NOTE — Telephone Encounter (Signed)
Please schedule Ms. Rauen to be seen in the lipid clinic.  Nelva Bush, MD [facility] Pager: [phone]

## 2018-02-16 NOTE — Telephone Encounter (Signed)
Spoke with patient and gave her results and recommendations.  She verbalized understanding, although she cannot take Ezetimibe, because it makes her sick.  The only statin she can tolerate is the Atorvastatin she is taking.  Please advise, thank you.

## 2018-02-20 ENCOUNTER — Ambulatory Visit (INDEPENDENT_AMBULATORY_CARE_PROVIDER_SITE_OTHER): Payer: Self-pay | Admitting: Pharmacist

## 2018-02-20 DIAGNOSIS — E782 Mixed hyperlipidemia: Secondary | ICD-10-CM

## 2018-02-20 NOTE — Patient Instructions (Signed)
We will send for coverage of Praluent 75mg  injection every 14 days through the PASS program. If they contact you or you receive the medication please let us know so that we can set up labs.     Cholesterol Cholesterol is a fat. Your body needs a small amount of cholesterol. Cholesterol (plaque) may build up in your blood vessels (arteries). That makes you more likely to have a heart attack or stroke. You cannot feel your cholesterol level. Having a blood test is the only way to find out if your level is high. Keep your test results. Work with your doctor to keep your cholesterol at a good level. What do the results mean?  Total cholesterol is how much cholesterol is in your blood.  LDL is bad cholesterol. This is the type that can build up. Try to have low LDL.  HDL is good cholesterol. It cleans your blood vessels and carries LDL away. Try to have high HDL.  Triglycerides are fat that the body can store or burn for energy. What are good levels of cholesterol?  Total cholesterol below 200.  LDL below 100 is good for people who have health risks. LDL below 70 is good for people who have very high risks.  HDL above 40 is good. It is best to have HDL of 60 or higher.  Triglycerides below 150. How can I lower my cholesterol? Diet Follow your diet program as told by your doctor.  Choose fish, white meat chicken, or [place] that is roasted or baked. Try not to eat red meat, fried foods, sausage, or lunch meats.  Eat lots of fresh fruits and vegetables.  Choose whole grains, beans, pasta, potatoes, and cereals.  Choose olive oil, corn oil, or canola oil. Only use small amounts.  Try not to eat butter, mayonnaise, shortening, or palm kernel oils.  Try not to eat foods with trans fats.  Choose low-fat or nonfat dairy foods. ? Drink skim or nonfat milk. ? Eat low-fat or nonfat yogurt and cheeses. ? Try not to drink whole milk or cream. ? Try not to eat ice cream, egg yolks, or  full-fat cheeses.  Healthy desserts include angel food cake, ginger snaps, animal crackers, hard candy, popsicles, and low-fat or nonfat frozen yogurt. Try not to eat pastries, cakes, pies, and cookies.  Exercise Follow your exercise program as told by your doctor.  Be more active. Try gardening, walking, and taking the stairs.  Ask your doctor about ways that you can be more active.  Medicine  Take over-the-counter and prescription medicines only as told by your doctor. This information is not intended to replace advice given to you by your health care provider. Make sure you discuss any questions you have with your health care provider. Document Released: 12/24/2008 Document Revised: 04/28/2016 Document Reviewed: 04/08/2016 Elsevier Interactive Patient Education  [employer].

## 2018-02-20 NOTE — Progress Notes (Signed)
Patient ID: [name] [name]                 DOB: [birth year]                    MRN: [id number]     HPI: [name] [name] is a 62 y.o. female patient of Dr. Saunders Revel that presents today for lipid evaluation.  PMH includes coronary artery disease status post CABG (T10/2017) with postoperative atrial fibrillation, hypertension, hyperlipidemia, HFpEF, sinus node dysfunction status post permanent pacemaker, and sleep apnea on CPAP.   She presents today for discussion of cholesterol. She states that she has had no issues tolerating atorvastatin 80mg  daily. She states that she did have severe reactions to zetia and crestor; both of which caused her to feel as though she had the flu. When each medication was stopped she began to feel better shortly. She also states that she does not currently have prescription coverage. She is paying out of pocket for all of her medications until she is eligible for medicare next year.    Risk Factors: CAD s/p CABG, HTN, HLD, obesity LDL Goal: <70  Current Medications: atorvastatin 80mg  daily  Intolerances: zetia 10mg  daily, wlechol (felt as though had flu), red yeast rice (hives), crestor (felt as though had flu)   Diet: Eats from home and from out. She eats mostly from sit down restaurants. She mostly red meat. She grills in the summer time and baked in the winter. She does eat vegetables. She drinks diet coke mostly.   Exercise: Work for [employer] and is very active with walking there.   Family History: mother - cancer, heart disease, breast cancer; sister - DM; father - heart disease  Social History: former smoker quit 20 years ago. Denies alcohol.   Labs: 02/14/18: TC149, TG 144, HDL 43, LDL 77 (atorvastatin 80mg  daily, fish oil)   Past Medical History:  Diagnosis Date  . Allergy   . Anal fissure   . Anxiety   . Arthritis   . Asthma   . Cataract 2010    left  . Colon polyps    diverticulosis  03-2011/2005  . Depression   . Diabetes mellitus 2012   T2DM  .  Diverticulosis   . Fatty liver 04/2011  . GERD (gastroesophageal reflux disease)   . Hyperlipidemia   . Hypertension severe   . Migraine   . Obesity   . OSA (obstructive sleep apnea)    C-Pap  . Pacemaker Medtronic   . Sinoatrial node dysfunction (HCC)    syncope    Current Outpatient Medications on File Prior to Visit  Medication Sig Dispense Refill  . albuterol (PROVENTIL HFA;VENTOLIN HFA) 108 (90 Base) MCG/ACT inhaler Inhale into the lungs every 6 (six) hours as needed for wheezing or shortness of breath.    Marland Kitchen aspirin EC 81 MG tablet Take 81 mg by mouth daily.    Marland Kitchen atorvastatin (LIPITOR) 80 MG tablet TAKE ONE TABLET BY MOUTH ONCE DAILY AT 6 PM 90 tablet 3  . cetirizine (ZYRTEC) 10 MG tablet Take 10 mg by mouth daily.      . citalopram (CELEXA) 20 MG tablet Take 1 tablet (20 mg total) by mouth daily. Office visit needed 90 tablet 0  . cyclobenzaprine (FLEXERIL) 10 MG tablet TAKE 1 TABLET BY MOUTH AT BEDTIME AS NEEDED AND MAY REPEAT DOSE ONE TIME IF NEEDED FOR MUSCLE SPASMS, CAREFUL OF SEDATION 30 tablet 1  . dicyclomine (BENTYL) 20 MG tablet Take 20 mg  by mouth 3 (three) times daily.    . fish oil-omega-3 fatty acids 1000 MG capsule Take 2 g by mouth daily.     . fluticasone (FLONASE) 50 MCG/ACT nasal spray Place 2 sprays into both nostrils daily. 48 g 3  . fluticasone (FLOVENT HFA) 110 MCG/ACT inhaler Inhale 2 puffs into the lungs 2 (two) times daily. 1 Inhaler 12  . gabapentin (NEURONTIN) 300 MG capsule TAKE 2 CAPSULES BY MOUTH IN THE MORNING AND 3 CAPS AT BEDTIME 360 capsule 1  . glimepiride (AMARYL) 4 MG tablet TAKE 1/2 (ONE-HALF) TABLET BY MOUTH TWICE DAILY 180 tablet 1  . glucose blood test strip Use as instructed 200 each 5  . hydrOXYzine (ATARAX/VISTARIL) 25 MG tablet Take 25 mg by mouth every 8 (eight) hours as needed for anxiety.    Marland Kitchen lisinopril (PRINIVIL,ZESTRIL) 20 MG tablet Take 20 mg by mouth daily.    . metoprolol succinate (TOPROL-XL) 50 MG 24 hr tablet Take 1 tablet  (50 mg total) by mouth 2 (two) times daily. Take with or immediately following a meal. 180 tablet 3  . Multiple Vitamin (MULTIVITAMIN) tablet Take 1 tablet by mouth daily.    Marland Kitchen omeprazole (PRILOSEC) 20 MG capsule Take 1 capsule (20 mg total) by mouth daily. 90 capsule 1  . ondansetron (ZOFRAN-ODT) 8 MG disintegrating tablet DISSOLVE 1 ON TONGUE EVERY 12 HOURS AS NEEDED FOR NAUSEA 30 tablet 3  . traMADol (ULTRAM) 50 MG tablet Take 1 tablet (50 mg total) by mouth at bedtime as needed. 30 tablet 2  . triamcinolone cream (KENALOG) 0.1 % Apply 1 application topically as needed. 30 g 2  . valACYclovir (VALTREX) 1000 MG tablet Take 1 tablet (1,000 mg total) by mouth every 8 (eight) hours. 21 tablet 0   No current facility-administered medications on file prior to visit.     Allergies  Allergen Reactions  . Adhesive [Tape] Other (See Comments)    SKIN BLISTERS  . Penicillins     Swelling   . Red Yeast Rice Hives  . Sulfa Drugs Cross Reactors Hives    Assessment/Plan: Hyperlipidemia: LDL not at goal. Given that patient is intolerant to crestor and zetia will send for coverage of PCSK9i therapy (Praluent 75mg  Q14days). Discussed risk/benefit, cost, and injection technique. Since she does not have prescription coverage currently she likely will qualify for patient assistance and she has filled out the form for the [employer] program. She is aware to notify us if she hears from P[employer] about coverage of medication.    Thank you,  Lelan Pons. Patterson Hammersmith, [facility]  02/20/2018 7:33 AM

## 2018-02-21 ENCOUNTER — Encounter: Payer: Self-pay | Admitting: Pharmacist

## 2018-03-13 ENCOUNTER — Telehealth: Payer: Self-pay | Admitting: Pharmacist

## 2018-03-13 NOTE — Telephone Encounter (Signed)
Pt approved for Praluent at no cost through [place] to notify pt to call to set up shipment.

## 2018-03-14 NOTE — Telephone Encounter (Signed)
Spoke with patient and advised of approval she will call to set up shipment. She is aware to call once she has medication so that labs can be ordered.

## 2018-03-27 ENCOUNTER — Other Ambulatory Visit: Payer: Self-pay

## 2018-03-27 ENCOUNTER — Encounter: Payer: Self-pay | Admitting: Physician Assistant

## 2018-03-27 ENCOUNTER — Ambulatory Visit: Payer: Self-pay | Admitting: Physician Assistant

## 2018-03-27 VITALS — BP 136/78 | HR 82 | Temp 98.6°F | Resp 20 | Ht 63.07 in | Wt 227.2 lb

## 2018-03-27 DIAGNOSIS — F419 Anxiety disorder, unspecified: Secondary | ICD-10-CM

## 2018-03-27 DIAGNOSIS — E1142 Type 2 diabetes mellitus with diabetic polyneuropathy: Secondary | ICD-10-CM

## 2018-03-27 DIAGNOSIS — G43109 Migraine with aura, not intractable, without status migrainosus: Secondary | ICD-10-CM

## 2018-03-27 LAB — POCT GLYCOSYLATED HEMOGLOBIN (HGB A1C): Hemoglobin A1C: 9.7 % — AB (ref 4.0–5.6)

## 2018-03-27 MED ORDER — METFORMIN HCL 500 MG PO TABS: 500.0000 mg | ORAL_TABLET | Freq: Two times a day (BID) | ORAL | 3 refills | Status: DC

## 2018-03-27 MED ORDER — CITALOPRAM HYDROBROMIDE 20 MG PO TABS: 20.0000 mg | ORAL_TABLET | Freq: Every day | ORAL | 3 refills | Status: DC

## 2018-03-27 MED ORDER — ONDANSETRON 8 MG PO TBDP: ORAL_TABLET | ORAL | 3 refills | Status: DC

## 2018-03-27 NOTE — Progress Notes (Signed)
MRN: [id number]  Subjective:   [name] [name] is a 62 y.o. female with PMH of coronary artery disease status post CABG (07/2016) with postoperative atrial fibrillation, hypertension, hyperlipidemia,HFpEF,sinus node dysfunction status post permanent pacemaker, sleep apnea on CPAP, T2DM, exercise/cold induced asthma, seasonal/irritant  allergies, and anxiety presenting for medication refill. Pacemaker, CAD, HTN, and HLD managed by cardiology. Last visit was 02/13/18. F/u in 6 months.   T2DM:Diagnosis was made ~16 years ago. Patient is currently managed with Continued amaryl 2 mg BID which has been somewhat effective. Admits good compliance. She was d/c'd from Metformin several years ago due to mildly elevated LFT's, no other issues.  Patient is checking home blood sugars. Home blood sugar records: BGs range between 140 and 180. Current symptoms include hyperglycemia. Patient denies foot ulcerations, hypoglycemia , nausea, paresthesia of the feet, polydipsia, polyuria, visual disturbances, vomiting and weight loss. Patient is checking their feet daily. No foot concerns. Last diabetic eye exam eye exam 12/27/2017. Diet:  "less read meat, chicken, and salad. No fries, gave up sweet tea, decreased snacking." Exercise includes "very physical job." Denies smoking or alcohol use. Known diabetic complications: peripheral neuropathy, controlled with gabapentin. Immunizations: Flu vaccine:2017, shingles: never, pneumococal vaccine: 2009   Depression/Anxiety: Has been on celxea 30m x 8 years. More anxiety related. "I dont do well with stupid." Notes it helps the most with irritability. Has actually been out for 2 weeks and she notes she can tell her irritability and anxiety are uncontrolled. Denies flu like sx, dizziness, diarrhea, nausea, and headache.   Needs refill for zofran for nausea. Will occasionally have nausea if has a migraine, not present today.   ROS per HPI  Past Medical History:  Diagnosis  Date  . Allergy   . Anal fissure   . Anxiety   . Arthritis   . Asthma   . Cataract 2010    left  . Colon polyps    diverticulosis  03-2011/2005  . Depression   . Diabetes mellitus 2012   T2DM  . Diverticulosis   . Fatty liver 04/2011  . GERD (gastroesophageal reflux disease)   . Hyperlipidemia   . Hypertension severe   . Migraine   . Obesity   . OSA (obstructive sleep apnea)    C-Pap  . Pacemaker Medtronic   . Sinoatrial node dysfunction (HCC)    syncope   Past Surgical History:  Procedure Laterality Date  . ABDOMINAL HYSTERECTOMY  2000   total  . CARDIAC CATHETERIZATION N/A 07/20/2016   Procedure: Left Heart Cath and Coronary Angiography;  Surgeon: Peter M JMartinique MD;  [facility];  Service: Cardiovascular;  Laterality: N/A;  . CORONARY ARTERY BYPASS GRAFT N/A 07/22/2016   Procedure: CORONARY ARTERY BYPASS GRAFTING (CABG) x three , using left internal mammary artery and right leg greater saphenous vein harvested endoscopically;  Surgeon: PIvin Poot MD;  [facility] [facility]  Service: Open Heart Surgery;  Laterality: N/A;  . EYE SURGERY Right 2010  . OOPHORECTOMY    . PACEMAKER PLACEMENT    . T/A right knee    . TEE WITHOUT CARDIOVERSION N/A 07/22/2016   Procedure: TRANSESOPHAGEAL ECHOCARDIOGRAM (TEE);  Surgeon: PIvin Poot MD;  [facility]  Service: Open Heart Surgery;  Laterality: N/A;  . TONSILLECTOMY       Social History   Socioeconomic History  . Marital status: Single    Spouse name: Not on file  . Number of children: 0  . Years of  education: Not on file  . Highest education level: Not on file  Occupational History  . Occupation: Surveyor, quantity: Quarry manager [employer]  . Financial resource strain: Not on file  . Food insecurity:    Worry: Not on file    Inability: Not on file  . Transportation needs:    Medical: Not on file    Non-medical: Not on file  Tobacco Use  . Smoking status: Former Smoker     Types: Cigarettes    Last attempt to quit: 1995    Years since quitting: 24.4  . Smokeless tobacco: Never Used  . Tobacco comment: Quit 20 yrs ago  Substance and Sexual Activity  . Alcohol use: No    Alcohol/week: 0.0 oz  . Drug use: No  . Sexual activity: Yes    Birth control/protection: Post-menopausal, Other-see comments    Comment: Monogamous   Lifestyle  . Physical activity:    Days per week: Not on file    Minutes per session: Not on file  . Stress: Not on file  Relationships  . Social connections:    Talks on phone: Not on file    Gets together: Not on file    Attends religious service: Not on file    Active member of club or organization: Not on file    Attends meetings of clubs or organizations: Not on file    Relationship status: Not on file  . Intimate partner violence:    Fear of current or ex partner: Not on file    Emotionally abused: Not on file    Physically abused: Not on file    Forced sexual activity: Not on file  Other Topics Concern  . Not on file  Social History Narrative   Single. Exercise: No. Education: College.      Objective:   PHYSICAL EXAM BP 136/78 (BP Location: Left Arm, Patient Position: Sitting, Cuff Size: Large)   Pulse 82   Temp 98.6 F (37 C) (Oral)   Resp 20   Ht 5' 3.07" (1.602 m)   Wt 227 lb 3.2 oz (103.1 kg)   SpO2 93%   BMI 40.16 kg/m   Physical Exam  Constitutional: She is oriented to person, place, and time. She appears well-developed and well-nourished. No distress.  HENT:  Head: Normocephalic and atraumatic.  Mouth/Throat: Uvula is midline, oropharynx is clear and moist and mucous membranes are normal.  Eyes: Pupils are equal, round, and reactive to light. Conjunctivae and EOM are normal.  Neck: Normal range of motion.  Cardiovascular: Normal rate, regular rhythm, normal heart sounds and intact distal pulses.  Pulmonary/Chest: Effort normal and breath sounds normal. She has no wheezes. She has no rhonchi. She has  no rales.  Musculoskeletal:       Right lower leg: She exhibits no swelling.       Left lower leg: She exhibits no swelling.  Neurological: She is alert and oriented to person, place, and time.  Skin: Skin is warm and dry.  Psychiatric: She has a normal mood and affect.  Vitals reviewed.   Diabetic Foot Exam - Simple   Simple Foot Form Visual Inspection No deformities, no ulcerations, no other skin breakdown bilaterally:  Yes Sensation Testing Intact to touch and monofilament testing bilaterally:  Yes Pulse Check Posterior Tibialis and Dorsalis pulse intact bilaterally:  Yes Comments    Depression screen [facility] 2/9 03/27/2018 03/27/2018 10/20/2017 12/15/2016 11/19/2016  Decreased Interest 1 0 0  0 0  Down, Depressed, Hopeless 0 0 0 0 0  PHQ - 2 Score 1 0 0 0 0  Altered sleeping 3 - - - -  Tired, decreased energy 2 - - - -  Change in appetite 2 - - - -  Feeling bad or failure about yourself  0 - - - -  Trouble concentrating 2 - - - -  Moving slowly or fidgety/restless 3 - - - -  Suicidal thoughts 0 - - - -  PHQ-9 Score 13 - - - -  Difficult doing work/chores Very difficult - - - -   GAD 7 : Generalized Anxiety Score 03/27/2018  Nervous, Anxious, on Edge 0  Control/stop worrying 2  Worry too much - different things 2  Trouble relaxing 3  Restless 3  Easily annoyed or irritable 3  Afraid - awful might happen 0  Total GAD 7 Score 13  Anxiety Difficulty Very difficult    Wt Readings from Last 3 Encounters:  03/27/18 227 lb 3.2 oz (103.1 kg)  02/13/18 234 lb 6.4 oz (106.3 kg)  10/20/17 234 lb (106.1 kg)   Results for orders placed or performed in visit on 03/27/18 (from the past 24 hour(s))  POCT glycosylated hemoglobin (Hb A1C)     Status: Abnormal   Collection Time: 03/27/18 10:30 AM  Result Value Ref Range   Hemoglobin A1C 9.7 (A) 4.0 - 5.6 %   HbA1c, POC (prediabetic range)  5.7 - 6.4 %   HbA1c, POC (controlled diabetic range)  0.0 - 7.0 %    Assessment and Plan :  1.  Type 2 diabetes mellitus with diabetic polyneuropathy, without long-term current use of insulin (HCC) Uncontrolled. A1C 9.7. Up from 9.3 five months ago.  Will attempt trial of metformin again and monitor liver enzymes  and creatinine closely.  Per chart review, I do see that at one time her liver enzymes went up to the 60s but there is no clear indication as to when she stopped metformin in the chart.  Also increase Amaryl to 4 mg in the morning and 2 mg in the afternoon.  Advised to monitor blood sugar closely.  Educated on signs of hypoglycemia.  Plan to follow-up for lab only visit in 6 weeks and at 3 months, and office visit in 6 months. - POCT glycosylated hemoglobin (Hb A1C) - CMP14+EGFR - Microalbumin/Creatinine Ratio, Urine - HM Diabetes Foot Exam - metFORMIN (GLUCOPHAGE) 500 MG tablet; Take 1 tablet (500 mg total) by mouth 2 (two) times daily with a meal.  Dispense: 180 tablet; Refill: 3 - CMP14+EGFR; Future - Hemoglobin A1c; Future - CMP14+EGFR; Future  2. Anxiety No symptoms of withdrawal.  Refills provided. - citalopram (CELEXA) 20 MG tablet; Take 1 tablet (20 mg total) by mouth daily.  Dispense: 90 tablet; Refill: 3  3. Migraine with aura and without status migrainosus, not intractable Not present today.  Refills provided. - ondansetron (ZOFRAN-ODT) 8 MG disintegrating tablet; DISSOLVE 1 ON TONGUE EVERY 12 HOURS AS NEEDED FOR NAUSEA  Dispense: 30 tablet; Refill: [place], PA-C  [facility] [place] 03/27/2018 10:37 AM

## 2018-03-27 NOTE — Patient Instructions (Addendum)
Your A1C is 9.7. I recommend we add on metformin at this time. Start with 1 tablet in the am with food. After 1 week, increase to 1 tablet twice daily every day. I recommend also increasing amaryl to 1 full tablet in the am (I.e., 4 mg) with food and then 0.5 tablet (I.e., 2mg ) at night time with food. Increasing diabetes medication can cause low blood sugars. It is very important to continue monitoring your blood sugars. Goal is 110-120.  If you drop below 70 over the next two weeks, stop taking amaryl and contact our office. Signs of hypoglycemia are lightheadedness, blurred vision, headache, and confusion. Return in 6 weeks for lab only visit, again in 3 months for lab only, and then in 6 months for office visit.      Diabetes Mellitus and Nutrition When you have diabetes (diabetes mellitus), it is very important to have healthy eating habits because your blood sugar (glucose) levels are greatly affected by what you eat and drink. Eating healthy foods in the appropriate amounts, at about the same times every day, can help you:  Control your blood glucose.  Lower your risk of heart disease.  Improve your blood pressure.  Reach or maintain a healthy weight.  Every person with diabetes is different, and each person has different needs for a meal plan. Your health care provider may recommend that you work with a diet and nutrition specialist (dietitian) to make a meal plan that is best for you. Your meal plan may vary depending on factors such as:  The calories you need.  The medicines you take.  Your weight.  Your blood glucose, blood pressure, and cholesterol levels.  Your activity level.  Other health conditions you have, such as heart or kidney disease.  How do carbohydrates affect me? Carbohydrates affect your blood glucose level more than any other type of food. Eating carbohydrates naturally increases the amount of glucose in your blood. Carbohydrate counting is a method for  keeping track of how many carbohydrates you eat. Counting carbohydrates is important to keep your blood glucose at a healthy level, especially if you use insulin or take certain oral diabetes medicines. It is important to know how many carbohydrates you can safely have in each meal. This is different for every person. Your dietitian can help you calculate how many carbohydrates you should have at each meal and for snack. Foods that contain carbohydrates include:  Bread, cereal, rice, pasta, and crackers.  Potatoes and corn.  Peas, beans, and lentils.  Milk and yogurt.  Fruit and juice.  Desserts, such as cakes, cookies, ice cream, and candy.  How does alcohol affect me? Alcohol can cause a sudden decrease in blood glucose (hypoglycemia), especially if you use insulin or take certain oral diabetes medicines. Hypoglycemia can be a life-threatening condition. Symptoms of hypoglycemia (sleepiness, dizziness, and confusion) are similar to symptoms of having too much alcohol. If your health care provider says that alcohol is safe for you, follow these guidelines:  Limit alcohol intake to no more than 1 drink per day for nonpregnant women and 2 drinks per day for men. One drink equals 12 oz of beer, 5 oz of wine, or 1 oz of hard liquor.  Do not drink on an empty stomach.  Keep yourself hydrated with water, diet soda, or unsweetened iced tea.  Keep in mind that regular soda, juice, and other mixers may contain a lot of sugar and must be counted as carbohydrates.  What are  tips for following this plan? Reading food labels  Start by checking the serving size on the label. The amount of calories, carbohydrates, fats, and other nutrients listed on the label are based on one serving of the food. Many foods contain more than one serving per package.  Check the total grams (g) of carbohydrates in one serving. You can calculate the number of servings of carbohydrates in one serving by dividing the  total carbohydrates by 15. For example, if a food has 30 g of total carbohydrates, it would be equal to 2 servings of carbohydrates.  Check the number of grams (g) of saturated and trans fats in one serving. Choose foods that have low or no amount of these fats.  Check the number of milligrams (mg) of sodium in one serving. Most people should limit total sodium intake to less than 2,300 mg per day.  Always check the nutrition information of foods labeled as "low-fat" or "nonfat". These foods may be higher in added sugar or refined carbohydrates and should be avoided.  Talk to your dietitian to identify your daily goals for nutrients listed on the label. Shopping  Avoid buying canned, premade, or processed foods. These foods tend to be high in fat, sodium, and added sugar.  Shop around the outside edge of the grocery store. This includes fresh fruits and vegetables, bulk grains, fresh meats, and fresh dairy. Cooking  Use low-heat cooking methods, such as baking, instead of high-heat cooking methods like deep frying.  Cook using healthy oils, such as olive, canola, or sunflower oil.  Avoid cooking with butter, cream, or high-fat meats. Meal planning  Eat meals and snacks regularly, preferably at the same times every day. Avoid going long periods of time without eating.  Eat foods high in fiber, such as fresh fruits, vegetables, beans, and whole grains. Talk to your dietitian about how many servings of carbohydrates you can eat at each meal.  Eat 4-6 ounces of lean protein each day, such as lean meat, chicken, fish, eggs, or tofu. 1 ounce is equal to 1 ounce of meat, chicken, or fish, 1 egg, or 1/4 cup of tofu.  Eat some foods each day that contain healthy fats, such as avocado, nuts, seeds, and fish. Lifestyle   Check your blood glucose regularly.  Exercise at least 30 minutes 5 or more days each week, or as told by your health care provider.  Take medicines as told by your health  care provider.  Do not use any products that contain nicotine or tobacco, such as cigarettes and e-cigarettes. If you need help quitting, ask your health care provider.  Work with a Social worker or diabetes educator to identify strategies to manage stress and any emotional and social challenges. What are some questions to ask my health care provider?  Do I need to meet with a diabetes educator?  Do I need to meet with a dietitian?  What number can I call if I have questions?  When are the best times to check my blood glucose? Where to find more information:  American Diabetes Association: diabetes.org/food-and-fitness/food  Academy of Nutrition and Dietetics: PokerClues.dk  [employer] of Diabetes and Digestive and Kidney Diseases (NIH): ContactWire.be Summary  A healthy meal plan will help you control your blood glucose and maintain a healthy lifestyle.  Working with a diet and nutrition specialist (dietitian) can help you make a meal plan that is best for you.  Keep in mind that carbohydrates and alcohol have immediate effects on  your blood glucose levels. It is important to count carbohydrates and to use alcohol carefully. This information is not intended to replace advice given to you by your health care provider. Make sure you discuss any questions you have with your health care provider. Document Released: 06/24/2005 Document Revised: 11/01/2016 Document Reviewed: 11/01/2016 Elsevier Interactive Patient Education  2018 [employer].    IF you received an x-ray today, you will receive an invoice from [facility]. Please contact [facility] at [phone] with questions or concerns regarding your invoice.   IF you received labwork today, you will receive an invoice from [facility]. Please contact [facility] at [phone] with  questions or concerns regarding your invoice.   Our billing staff will not be able to assist you with questions regarding bills from these companies.  You will be contacted with the lab results as soon as they are available. The fastest way to get your results is to activate your My Chart account. Instructions are located on the last page of this paperwork. If you have not heard from [place] regarding the results in 2 weeks, please contact this office.

## 2018-03-28 LAB — MICROALBUMIN / CREATININE URINE RATIO
CREATININE, UR: 48.7 mg/dL
Microalb/Creat Ratio: 93 mg/g creat — ABNORMAL HIGH (ref 0.0–30.0)
Microalbumin, Urine: 45.3 ug/mL

## 2018-03-28 LAB — CMP14+EGFR
ALBUMIN: 4.2 g/dL (ref 3.6–4.8)
ALK PHOS: 99 IU/L (ref 39–117)
ALT: 29 IU/L (ref 0–32)
AST: 33 IU/L (ref 0–40)
Albumin/Globulin Ratio: 1.7 (ref 1.2–2.2)
BUN / CREAT RATIO: 21 (ref 12–28)
BUN: 18 mg/dL (ref 8–27)
Bilirubin Total: 0.4 mg/dL (ref 0.0–1.2)
CALCIUM: 9.6 mg/dL (ref 8.7–10.3)
CO2: 22 mmol/L (ref 20–29)
CREATININE: 0.87 mg/dL (ref 0.57–1.00)
Chloride: 101 mmol/L (ref 96–106)
GFR calc non Af Amer: 71 mL/min/{1.73_m2} (ref >59)
GFR, EST AFRICAN AMERICAN: 82 mL/min/{1.73_m2} (ref >59)
GLUCOSE: 424 mg/dL — AB (ref 65–99)
Globulin, Total: 2.5 g/dL (ref 1.5–4.5)
Potassium: 4.9 mmol/L (ref 3.5–5.2)
Sodium: 137 mmol/L (ref 134–144)
TOTAL PROTEIN: 6.7 g/dL (ref 6.0–8.5)

## 2018-03-30 ENCOUNTER — Ambulatory Visit (INDEPENDENT_AMBULATORY_CARE_PROVIDER_SITE_OTHER): Payer: Self-pay | Admitting: *Deleted

## 2018-03-30 ENCOUNTER — Telehealth: Payer: Self-pay | Admitting: Cardiology

## 2018-03-30 DIAGNOSIS — R55 Syncope and collapse: Secondary | ICD-10-CM

## 2018-03-30 DIAGNOSIS — I495 Sick sinus syndrome: Secondary | ICD-10-CM

## 2018-03-30 NOTE — Telephone Encounter (Signed)
Spoke with pt and reminded pt of remote transmission that is due today. Pt verbalized understanding.   

## 2018-03-31 NOTE — Progress Notes (Signed)
Remote pacemaker transmission.   

## 2018-04-18 LAB — CUP PACEART REMOTE DEVICE CHECK
Battery Impedance: 716 Ohm
Battery Remaining Longevity: 86 mo
Battery Voltage: 2.78 V
Brady Statistic AP VP Percent: 0 %
Brady Statistic AP VS Percent: 0 %
Brady Statistic AS VP Percent: 0 %
Date Time Interrogation Session: 20190621020424
Implantable Lead Implant Date: 20091201
Implantable Lead Location: 753859
Implantable Lead Location: 753860
Implantable Lead Model: 5076
Implantable Lead Model: 5076
Implantable Pulse Generator Implant Date: 20091201
Lead Channel Pacing Threshold Amplitude: 0.5 V
Lead Channel Pacing Threshold Pulse Width: 0.4 ms
Lead Channel Pacing Threshold Pulse Width: 0.4 ms
Lead Channel Setting Pacing Amplitude: 2 V
Lead Channel Setting Pacing Amplitude: 2.5 V
Lead Channel Setting Pacing Pulse Width: 0.4 ms
MDC IDC LEAD IMPLANT DT: 20091201
MDC IDC MSMT LEADCHNL RA IMPEDANCE VALUE: 434 Ohm
MDC IDC MSMT LEADCHNL RV IMPEDANCE VALUE: 590 Ohm
MDC IDC MSMT LEADCHNL RV PACING THRESHOLD AMPLITUDE: 0.75 V
MDC IDC SET LEADCHNL RV SENSING SENSITIVITY: 5.6 mV
MDC IDC STAT BRADY AS VS PERCENT: 100 %

## 2018-04-30 ENCOUNTER — Other Ambulatory Visit: Payer: Self-pay | Admitting: Internal Medicine

## 2018-04-30 DIAGNOSIS — I10 Essential (primary) hypertension: Secondary | ICD-10-CM

## 2018-05-01 NOTE — Telephone Encounter (Signed)
Please review for refill, thanks ! 

## 2018-05-19 ENCOUNTER — Other Ambulatory Visit: Payer: Self-pay | Admitting: *Deleted

## 2018-05-19 DIAGNOSIS — E785 Hyperlipidemia, unspecified: Secondary | ICD-10-CM

## 2018-05-19 LAB — LIPID PANEL
CHOLESTEROL TOTAL: 75 mg/dL — AB (ref 100–199)
Chol/HDL Ratio: 1.7 ratio (ref 0.0–4.4)
HDL: 43 mg/dL (ref >39)
LDL CALC: 14 mg/dL (ref 0–99)
TRIGLYCERIDES: 88 mg/dL (ref 0–149)
VLDL CHOLESTEROL CAL: 18 mg/dL (ref 5–40)

## 2018-05-19 LAB — ALT: ALT: 29 IU/L (ref 0–32)

## 2018-05-22 ENCOUNTER — Telehealth: Payer: Self-pay

## 2018-05-22 NOTE — Telephone Encounter (Signed)
-----   Message from Nelva Bush, MD sent at 05/21/2018  7:23 PM EDT ----- Cholesterol very well controlled; if anything, total cholesterol and LDL may be a bit low.  I will forward these results to the lipid clinic to get their input regarding ongoing alirocumab dosing (she is already on the lower dose).  She should continue her current medications for now.

## 2018-05-22 NOTE — Telephone Encounter (Signed)
Notes recorded by Frederik Schmidt, RN on 05/22/2018 at 10:17 AM EDT Informed patient of Dr Darnelle Bos AND Georgina Peer Auten's recommendations. Patient verbalized understanding. ------

## 2018-06-27 ENCOUNTER — Encounter: Payer: Self-pay | Admitting: Internal Medicine

## 2018-06-27 ENCOUNTER — Ambulatory Visit (INDEPENDENT_AMBULATORY_CARE_PROVIDER_SITE_OTHER): Payer: Self-pay | Admitting: Internal Medicine

## 2018-06-27 VITALS — BP 140/82 | HR 72 | Ht 63.0 in | Wt 224.8 lb

## 2018-06-27 DIAGNOSIS — I495 Sick sinus syndrome: Secondary | ICD-10-CM

## 2018-06-27 DIAGNOSIS — Z950 Presence of cardiac pacemaker: Secondary | ICD-10-CM

## 2018-06-27 DIAGNOSIS — R002 Palpitations: Secondary | ICD-10-CM

## 2018-06-27 LAB — CUP PACEART INCLINIC DEVICE CHECK
Battery Remaining Longevity: 84 mo
Battery Voltage: 2.78 V
Brady Statistic AS VP Percent: 0 %
Implantable Lead Implant Date: 20091201
Implantable Lead Location: 753859
Implantable Lead Model: 5076
Implantable Lead Model: 5076
Implantable Pulse Generator Implant Date: 20091201
Lead Channel Impedance Value: 412 Ohm
Lead Channel Pacing Threshold Amplitude: 0.5 V
Lead Channel Pacing Threshold Amplitude: 0.875 V
Lead Channel Pacing Threshold Pulse Width: 0.4 ms
Lead Channel Sensing Intrinsic Amplitude: 1.4 mV
Lead Channel Sensing Intrinsic Amplitude: 15.67 mV
Lead Channel Setting Pacing Pulse Width: 0.4 ms
MDC IDC LEAD IMPLANT DT: 20091201
MDC IDC LEAD LOCATION: 753860
MDC IDC MSMT BATTERY IMPEDANCE: 764 Ohm
MDC IDC MSMT LEADCHNL RV IMPEDANCE VALUE: 568 Ohm
MDC IDC MSMT LEADCHNL RV PACING THRESHOLD AMPLITUDE: 0.75 V
MDC IDC MSMT LEADCHNL RV PACING THRESHOLD PULSEWIDTH: 0.4 ms
MDC IDC MSMT LEADCHNL RV PACING THRESHOLD PULSEWIDTH: 0.4 ms
MDC IDC SESS DTM: 20190917152041
MDC IDC SET LEADCHNL RA PACING AMPLITUDE: 2 V
MDC IDC SET LEADCHNL RV PACING AMPLITUDE: 2.5 V
MDC IDC SET LEADCHNL RV SENSING SENSITIVITY: 4 mV
MDC IDC STAT BRADY AP VP PERCENT: 0 %
MDC IDC STAT BRADY AP VS PERCENT: 0 %
MDC IDC STAT BRADY AS VS PERCENT: 100 %

## 2018-06-27 NOTE — Patient Instructions (Addendum)
Medication Instructions:  Your physician recommends that you continue on your current medications as directed. Please refer to the Current Medication list given to you today.  Labwork: None ordered.  Testing/Procedures: None ordered.  Follow-Up: Your physician wants you to follow-up in: One Year with Tommye Standard, PA. You will receive a reminder letter in the mail two months in advance. If you don't receive a letter, please call our office to schedule the follow-up appointment.  Remote monitoring is used to monitor your Pacemaker of ICD from home. This monitoring reduces the number of office visits required to check your device to one time per year. It allows [place] to keep an eye on the functioning of your device to ensure it is working properly. You are scheduled for a device check from home on 06/29/18. You may send your transmission at any time that day. If you have a wireless device, the transmission will be sent automatically. After your physician reviews your transmission, you will receive a postcard with your next transmission date.     Any Other Special Instructions Will Be Listed Below (If Applicable).     If you need a refill on your cardiac medications before your next appointment, please call your pharmacy.

## 2018-06-27 NOTE — Progress Notes (Signed)
Patient Care Team: Donzetta Kohut as PCP - General (Physician Assistant) End, Harrell Gave, MD as PCP - Cardiology (Cardiology) Himmelrich, Bryson Ha, RD (Inactive) as Dietitian Deboraha Sprang, MD as Attending Physician (Cardiology) Marygrace Drought, MD as Consulting Physician (Ophthalmology) Vickey Huger, MD as Consulting Physician (Orthopedic Surgery)   HPI  [name] [name] is a 62 y.o. female With a history of Pacing for sinus node dysfunction. She paces almost never.  Admitted 10/17 w STEMI  Cath>>  Severe 3 vessel obstructive CAD   . Coronary artery bypass grafting x3 (left internal mammary artery to LAD, saphenous vein graft to diagonal, saphenous vein graft to obtuse marginal).     She has lost 20 lbs and is feeling much better  Still sob but recovers more frequently,  No edema  No chest pain    She is using her CPAP.     Past Medical History:  Diagnosis Date  . Allergy   . Anal fissure   . Anxiety   . Arthritis   . Asthma   . Cataract 2010    left  . Colon polyps    diverticulosis  03-2011/2005  . Depression   . Diabetes mellitus 2012   T2DM  . Diverticulosis   . Fatty liver 04/2011  . GERD (gastroesophageal reflux disease)   . Hyperlipidemia   . Hypertension severe   . Migraine   . Obesity   . OSA (obstructive sleep apnea)    C-Pap  . Pacemaker Medtronic   . Sinoatrial node dysfunction (HCC)    syncope    Past Surgical History:  Procedure Laterality Date  . ABDOMINAL HYSTERECTOMY  2000   total  . CARDIAC CATHETERIZATION N/A 07/20/2016   Procedure: Left Heart Cath and Coronary Angiography;  Surgeon: Peter M Martinique, MD;  Location: [facility];  Service: Cardiovascular;  Laterality: N/A;  . CORONARY ARTERY BYPASS GRAFT N/A 07/22/2016   Procedure: CORONARY ARTERY BYPASS GRAFTING (CABG) x three , using left internal mammary artery and right leg greater saphenous vein harvested endoscopically;  Surgeon: Ivin Poot, MD;   Location: [facility];  Service: Open Heart Surgery;  Laterality: N/A;  . EYE SURGERY Right 2010  . OOPHORECTOMY    . PACEMAKER PLACEMENT    . T/A right knee    . TEE WITHOUT CARDIOVERSION N/A 07/22/2016   Procedure: TRANSESOPHAGEAL ECHOCARDIOGRAM (TEE);  Surgeon: Ivin Poot, MD;  Location: [facility];  Service: Open Heart Surgery;  Laterality: N/A;  . TONSILLECTOMY      Current Outpatient Medications  Medication Sig Dispense Refill  . albuterol (PROVENTIL HFA;VENTOLIN HFA) 108 (90 Base) MCG/ACT inhaler Inhale into the lungs every 6 (six) hours as needed for wheezing or shortness of breath.    . Alirocumab (PRALUENT) 75 MG/ML SOPN Inject 75 mg into the skin every 14 (fourteen) days.    Marland Kitchen aspirin EC 81 MG tablet Take 81 mg by mouth daily.    Marland Kitchen atorvastatin (LIPITOR) 80 MG tablet TAKE ONE TABLET BY MOUTH ONCE DAILY AT 6 PM 90 tablet 3  . cetirizine (ZYRTEC) 10 MG tablet Take 10 mg by mouth daily.      . citalopram (CELEXA) 20 MG tablet Take 1 tablet (20 mg total) by mouth daily. 90 tablet 3  . cyclobenzaprine (FLEXERIL) 10 MG tablet TAKE 1 TABLET BY MOUTH AT BEDTIME AS NEEDED AND MAY REPEAT DOSE ONE TIME IF NEEDED FOR MUSCLE SPASMS, CAREFUL OF SEDATION 30 tablet 1  .  fish oil-omega-3 fatty acids 1000 MG capsule Take 3 g by mouth 2 (two) times daily.     . fluticasone (FLONASE) 50 MCG/ACT nasal spray Place 2 sprays into both nostrils daily. 48 g 3  . fluticasone (FLOVENT HFA) 110 MCG/ACT inhaler Inhale 2 puffs into the lungs 2 (two) times daily. 1 Inhaler 12  . gabapentin (NEURONTIN) 300 MG capsule TAKE 2 CAPSULES BY MOUTH IN THE MORNING AND 3 CAPS AT BEDTIME 360 capsule 1  . glimepiride (AMARYL) 4 MG tablet TAKE 1/2 (ONE-HALF) TABLET BY MOUTH TWICE DAILY 180 tablet 1  . glucose blood test strip Use as instructed 200 each 5  . lisinopril (PRINIVIL,ZESTRIL) 20 MG tablet TAKE 1 TABLET BY MOUTH ONCE DAILY 90 tablet 3  . metFORMIN (GLUCOPHAGE) 500 MG tablet Take 1 tablet (500 mg total) by mouth 2 (two)  times daily with a meal. 180 tablet 3  . metoprolol succinate (TOPROL-XL) 50 MG 24 hr tablet Take 1 tablet (50 mg total) by mouth 2 (two) times daily. Take with or immediately following a meal. 180 tablet 3  . Multiple Vitamin (MULTIVITAMIN) tablet Take 1 tablet by mouth daily.    Marland Kitchen omeprazole (PRILOSEC) 20 MG capsule Take 1 capsule (20 mg total) by mouth daily. 90 capsule 1  . ondansetron (ZOFRAN-ODT) 8 MG disintegrating tablet DISSOLVE 1 ON TONGUE EVERY 12 HOURS AS NEEDED FOR NAUSEA 30 tablet 3  . traMADol (ULTRAM) 50 MG tablet Take 1 tablet (50 mg total) by mouth at bedtime as needed. 30 tablet 2  . triamcinolone cream (KENALOG) 0.1 % Apply 1 application topically as needed. 30 g 2   No current facility-administered medications for this visit.     Allergies  Allergen Reactions  . Adhesive [Tape] Other (See Comments)    SKIN BLISTERS  . Penicillins     Swelling   . Red Yeast Rice Hives  . Sulfa Drugs Cross Reactors Hives    Review of Systems negative except from HPI and PMH  Physical Exam BP 140/82   Pulse 72   Ht 5\' 3"  (1.6 m)   Wt 224 lb 12.8 oz (102 kg)   SpO2 97%   BMI 39.82 kg/m  Well developed and nourished in no acute distress HENT normal Neck supple with JVP-flat Clear Regular rate and rhythm, no murmurs or gallops Abd-soft with active BS No Clubbing cyanosis edema Skin-warm and sweaty  She blames the ambient heat  A & Oriented  Grossly normal sensory and motor function  ECG  NSR 72 18/09/43    Assessment and  Plan  Hypertension  HFpEF  Syncope  OSA  Atrial fibrillation-paroxysmal  Ischemic heart disease status post CABG 3   Pacemaker-Medtronic  The patient's device was interrogated.  The information was reviewed. No changes were made in the programming.     Sinus node dysfunction Euvolemic continue current meds  BP a little high  No intercurrent atrial fibrillation or flutter  She has been successful in losing weight gradually so will  hold off on more BP meds at present  Have suggested fu with PCP re CPAP evaluation  Not been read in years  Without symptoms of ischemia  Euvolemic continue current meds

## 2018-06-29 ENCOUNTER — Encounter: Payer: Self-pay | Admitting: *Deleted

## 2018-06-29 ENCOUNTER — Telehealth: Payer: Self-pay | Admitting: Cardiology

## 2018-06-29 NOTE — Telephone Encounter (Signed)
LMOVM reminding pt to send remote transmission.   

## 2018-06-30 ENCOUNTER — Encounter: Payer: Self-pay | Admitting: Cardiology

## 2018-08-03 ENCOUNTER — Encounter: Payer: Self-pay | Admitting: Family Medicine

## 2018-08-03 ENCOUNTER — Other Ambulatory Visit: Payer: Self-pay

## 2018-08-03 ENCOUNTER — Ambulatory Visit: Payer: Self-pay | Admitting: Family Medicine

## 2018-08-03 VITALS — BP 146/92 | HR 68 | Temp 98.3°F | Ht 62.0 in | Wt 220.0 lb

## 2018-08-03 DIAGNOSIS — J029 Acute pharyngitis, unspecified: Secondary | ICD-10-CM

## 2018-08-03 DIAGNOSIS — Z20818 Contact with and (suspected) exposure to other bacterial communicable diseases: Secondary | ICD-10-CM

## 2018-08-03 LAB — POCT RAPID STREP A (OFFICE): RAPID STREP A SCREEN: NEGATIVE

## 2018-08-03 NOTE — Patient Instructions (Addendum)
Rapid strep test was negative/normal but I will check a throat culture.  As symptoms are improving, would not recommend any new medication at this time.  If any fevers, worsening sore throat, or other worsening symptoms, return for recheck.  Thank you for coming in today.   Sore Throat A sore throat is pain, burning, irritation, or scratchiness in the throat. When you have a sore throat, you may feel pain or tenderness in your throat when you swallow or talk. Many things can cause a sore throat, including:  An infection.  Seasonal allergies.  Dryness in the air.  Irritants, such as smoke or pollution.  Gastroesophageal reflux disease (GERD).  A tumor.  A sore throat is often the first sign of another sickness. It may happen with other symptoms, such as coughing, sneezing, fever, and swollen neck glands. Most sore throats go away without medical treatment. Follow these instructions at home:  Take over-the-counter medicines only as told by your health care provider.  Drink enough fluids to keep your urine clear or pale yellow.  Rest as needed.  To help with pain, try: ? Sipping warm liquids, such as broth, herbal tea, or warm water. ? Eating or drinking cold or frozen liquids, such as frozen ice pops. ? Gargling with a salt-water mixture 3-4 times a day or as needed. To make a salt-water mixture, completely dissolve -1 tsp of salt in 1 cup of warm water. ? Sucking on hard candy or throat lozenges. ? Putting a cool-mist humidifier in your bedroom at night to moisten the air. ? Sitting in the bathroom with the door closed for 5-10 minutes while you run hot water in the shower.  Do not use any tobacco products, such as cigarettes, chewing tobacco, and e-cigarettes. If you need help quitting, ask your health care provider. Contact a health care provider if:  You have a fever for more than 2-3 days.  You have symptoms that last (are persistent) for more than 2-3 days.  Your  throat does not get better within 7 days.  You have a fever and your symptoms suddenly get worse. Get help right away if:  You have difficulty breathing.  You cannot swallow fluids, soft foods, or your saliva.  You have increased swelling in your throat or neck.  You have persistent nausea and vomiting. This information is not intended to replace advice given to you by your health care provider. Make sure you discuss any questions you have with your health care provider. Document Released: 11/04/2004 Document Revised: 05/23/2016 Document Reviewed: 07/18/2015 Elsevier Interactive Patient Education  [employer].   If you have lab work done today you will be contacted with your lab results within the next 2 weeks.  If you have not heard from [place] then please contact us. The fastest way to get your results is to register for My Chart.   IF you received an x-ray today, you will receive an invoice from [facility]. Please contact [facility] at [phone] with questions or concerns regarding your invoice.   IF you received labwork today, you will receive an invoice from [place]. Please contact [facility] at [phone] with questions or concerns regarding your invoice.   Our billing staff will not be able to assist you with questions regarding bills from these companies.  You will be contacted with the lab results as soon as they are available. The fastest way to get your results is to activate your My Chart account. Instructions are located  on the last page of this paperwork. If you have not heard from [place] regarding the results in 2 weeks, please contact this office.

## 2018-08-03 NOTE — Progress Notes (Signed)
Subjective:  By signing my name below, I, Moises Blood, attest that this documentation has been prepared under the direction and in the presence of Merri Ray, MD. Electronically Signed: Moises Blood, [place]. 08/03/2018 , 3:10 PM .  Patient was seen in Room 1 .   Patient ID: [name] [name], female    DOB: 1956-10-01, 62 y.o.   MRN: [id number] Chief Complaint  Patient presents with  . Sore Throat  . Otalgia    left ear   HPI [name] [name] is a 62 y.o. female Here for sore throat and left ear pain. She has a history of multiple medical problems per problem list including diabetes, diastolic heart failure, OSA, vtach with SA node dysfunction and pacemaker, GERD, CAD s/p CABG, allergies as well as asthmatic bronchitis.   Patient states sore throat and left ear pain started about 10 days ago. She felt more soreness around her neck about 2-3 days ago, but her symptoms have been improving. She has had sick contact of strep from grand nieces, and nephews, as well as church with play school. She hasn't been tested for strep over the course of this. She's been taking tylenol. She denies fever, congestion, cough or rhinorrhea.   Patient Active Problem List   Diagnosis Date Noted  . Palpitations 02/14/2018  . Coronary artery disease involving native coronary artery of native heart without angina pectoris 02/14/2018  . Morbid obesity ([place]) 02/14/2018  . Irritable bowel syndrome 06/28/2017  . S/P CABG x 3 07/22/2016  . Multiple vessel coronary artery disease 07/21/2016  . STEMI involving left circumflex coronary artery ([place]) 07/20/2016  . Asthmatic bronchitis 04/26/2016  . Allergic urticaria 04/26/2016  . Environmental allergies 04/26/2016  . Neck pain 04/26/2016  . Anxiety 04/26/2016  . Sinoatrial node dysfunction (HCC) 04/26/2016  . Pacemaker Medtronic   . Ventricular tachycardia, nonsustained-during exercise testing 03/07/2013  . OSA (obstructive sleep apnea) 05/25/2011  . Hx of  adenomatous colonic polyps 05/25/2011  . Migraine 05/25/2011  . Diabetes mellitus type 2, uncontrolled, with complications ([place]) 17/00/1749  . GERD (gastroesophageal reflux disease) 05/25/2011  . NAFLD (nonalcoholic fatty liver disease) 05/25/2011  . Mixed hyperlipidemia 05/25/2011  . Essential hypertension 09/16/2009  . DIASTOLIC HEART FAILURE, CHRONIC 09/16/2009   Past Medical History:  Diagnosis Date  . Allergy   . Anal fissure   . Anxiety   . Arthritis   . Asthma   . Cataract 2010    left  . Colon polyps    diverticulosis  03-2011/2005  . Depression   . Diabetes mellitus 2012   T2DM  . Diverticulosis   . Fatty liver 04/2011  . GERD (gastroesophageal reflux disease)   . Hyperlipidemia   . Hypertension severe   . Migraine   . Obesity   . OSA (obstructive sleep apnea)    C-Pap  . Pacemaker Medtronic   . Sinoatrial node dysfunction (HCC)    syncope   Past Surgical History:  Procedure Laterality Date  . ABDOMINAL HYSTERECTOMY  2000   total  . CARDIAC CATHETERIZATION N/A 07/20/2016   Procedure: Left Heart Cath and Coronary Angiography;  Surgeon: Peter M Martinique, MD;  Location: [facility];  Service: Cardiovascular;  Laterality: N/A;  . CORONARY ARTERY BYPASS GRAFT N/A 07/22/2016   Procedure: CORONARY ARTERY BYPASS GRAFTING (CABG) x three , using left internal mammary artery and right leg greater saphenous vein harvested endoscopically;  Surgeon: Ivin Poot, MD;  Location: [facility];  Service: Open Heart Surgery;  Laterality:  N/A;  . EYE SURGERY Right 2010  . OOPHORECTOMY    . PACEMAKER PLACEMENT    . T/A right knee    . TEE WITHOUT CARDIOVERSION N/A 07/22/2016   Procedure: TRANSESOPHAGEAL ECHOCARDIOGRAM (TEE);  Surgeon: Ivin Poot, MD;  Location: [facility];  Service: Open Heart Surgery;  Laterality: N/A;  . TONSILLECTOMY     Allergies  Allergen Reactions  . Adhesive [Tape] Other (See Comments)    SKIN BLISTERS  . Penicillins     Swelling   . Red Yeast  Rice Hives  . Sulfa Drugs Cross Reactors Hives   Prior to Admission medications   Medication Sig Start Date End Date Taking? Authorizing Provider  albuterol (PROVENTIL HFA;VENTOLIN HFA) 108 (90 Base) MCG/ACT inhaler Inhale into the lungs every 6 (six) hours as needed for wheezing or shortness of breath.    [provider]  Alirocumab (PRALUENT) 75 MG/ML SOPN Inject 75 mg into the skin every 14 (fourteen) days. 03/14/18   End, Harrell Gave, MD  aspirin EC 81 MG tablet Take 81 mg by mouth daily.    [provider]  atorvastatin (LIPITOR) 80 MG tablet TAKE ONE TABLET BY MOUTH ONCE DAILY AT 6 PM 10/20/17   English, Colletta [place] D, PA  cetirizine (ZYRTEC) 10 MG tablet Take 10 mg by mouth daily.      [provider]  citalopram (CELEXA) 20 MG tablet Take 1 tablet (20 mg total) by mouth daily. 03/27/18   Tenna Delaine D, PA-C  cyclobenzaprine (FLEXERIL) 10 MG tablet TAKE 1 TABLET BY MOUTH AT BEDTIME AS NEEDED AND MAY REPEAT DOSE ONE TIME IF NEEDED FOR MUSCLE SPASMS, CAREFUL OF SEDATION 07/15/17   English, Stephanie D, PA  fish oil-omega-3 fatty acids 1000 MG capsule Take 3 g by mouth 2 (two) times daily.     [provider]  fluticasone (FLONASE) 50 MCG/ACT nasal spray Place 2 sprays into both nostrils daily. 04/26/16   Ezekiel Slocumb, PA-C  fluticasone (FLOVENT HFA) 110 MCG/ACT inhaler Inhale 2 puffs into the lungs 2 (two) times daily. 04/26/16   Ezekiel Slocumb, PA-C  gabapentin (NEURONTIN) 300 MG capsule TAKE 2 CAPSULES BY MOUTH IN THE MORNING AND 3 CAPS AT BEDTIME 11/15/17   English, Stephanie D, PA  glimepiride (AMARYL) 4 MG tablet TAKE 1/2 (ONE-HALF) TABLET BY MOUTH TWICE DAILY 10/20/17   Ivar Drape D, PA  glucose blood test strip Use as instructed 03/06/15   Barton Fanny, MD  lisinopril (PRINIVIL,ZESTRIL) 20 MG tablet TAKE 1 TABLET BY MOUTH ONCE DAILY 05/02/18   End, Harrell Gave, MD  metFORMIN (GLUCOPHAGE) 500 MG tablet Take 1 tablet (500 mg total) by mouth 2  (two) times daily with a meal. 03/27/18   Timmothy Euler, [place] D, PA-C  metoprolol succinate (TOPROL-XL) 50 MG 24 hr tablet Take 1 tablet (50 mg total) by mouth 2 (two) times daily. Take with or immediately following a meal. 02/14/18   End, Harrell Gave, MD  Multiple Vitamin (MULTIVITAMIN) tablet Take 1 tablet by mouth daily. 08/30/16   Nani Skillern, PA-C  omeprazole (PRILOSEC) 20 MG capsule Take 1 capsule (20 mg total) by mouth daily. 12/15/16   Ivar Drape D, PA  ondansetron (ZOFRAN-ODT) 8 MG disintegrating tablet DISSOLVE 1 ON TONGUE EVERY 12 HOURS AS NEEDED FOR NAUSEA 03/27/18   Tenna Delaine D, PA-C  traMADol (ULTRAM) 50 MG tablet Take 1 tablet (50 mg total) by mouth at bedtime as needed. 10/20/17   Ivar Drape D, PA  triamcinolone cream (KENALOG) 0.1 %  Apply 1 application topically as needed. 04/26/16   Ezekiel Slocumb, PA-C   Social History   Socioeconomic History  . Marital status: Single    Spouse name: Not on file  . Number of children: 0  . Years of education: Not on file  . Highest education level: Not on file  Occupational History  . 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 Financial resource strain: Not on file  . Food insecurity:    Worry: Not on file    Inability: Not on file  . Transportation needs:    Medical: Not on file    Non-medical: Not on file  Tobacco Use  . Smoking status: Former Smoker    Types: Cigarettes    Last attempt to quit: 1995    Years since quitting: 24.8  . Smokeless tobacco: Never Used  . Tobacco comment: Quit 20 yrs ago  Substance and Sexual Activity  . Alcohol use: No    Alcohol/week: 0.0 standard drinks  . Drug use: No  . Sexual activity: Yes    Birth control/protection: Post-menopausal, Other-see comments    Comment: Monogamous   Lifestyle  . Physical activity:    Days per week: Not on file    Minutes per session: Not on file  . Stress: Not on file  Relationships  . Social  connections:    Talks on phone: Not on file    Gets together: Not on file    Attends religious service: Not on file    Active member of club or organization: Not on file    Attends meetings of clubs or organizations: Not on file    Relationship status: Not on file  . Intimate partner violence:    Fear of current or ex partner: Not on file    Emotionally abused: Not on file    Physically abused: Not on file    Forced sexual activity: Not on file  Other Topics Concern  . Not on file  Social History Narrative   Single. Exercise: No. Education: College.   Review of Systems  Constitutional: Negative for chills, fatigue, fever and unexpected weight change.  HENT: Positive for ear pain and sore throat. Negative for congestion and rhinorrhea.   Respiratory: Negative for cough and shortness of breath.   Gastrointestinal: Negative for constipation, diarrhea, nausea and vomiting.  Skin: Negative for rash and wound.  Neurological: Negative for dizziness, weakness and headaches.       Objective:   Physical Exam  Constitutional: She is oriented to person, place, and time. She appears well-developed and well-nourished. No distress.  HENT:  Head: Normocephalic and atraumatic.  Right Ear: Tympanic membrane and ear canal normal.  Left Ear: Tympanic membrane and ear canal normal.  Mouth/Throat: Oropharynx is clear and moist and mucous membranes are normal. No oropharyngeal exudate or posterior oropharyngeal erythema.  Ears: external pinna non tender, TM pearly gray bilaterally Oropharynx: moist mucosa, no cobblestoning or exudate  Eyes: Pupils are equal, round, and reactive to light. EOM are normal.  Neck: Neck supple.  Cardiovascular: Normal rate.  Pulmonary/Chest: Effort normal. No respiratory distress.  Musculoskeletal: Normal range of motion.  Lymphadenopathy:  There are a few AC nodes, non tender  Neurological: She is alert and oriented to person, place, and time.  Skin: Skin is warm  and dry.  Psychiatric: She has a normal mood and affect. Her behavior is normal.  Nursing note and vitals reviewed.   Vitals:  08/03/18 1427 08/03/18 1431  BP: (!) 176/82 (!) 146/92  Pulse: 68   Temp: 98.3 F (36.8 C)   TempSrc: Oral   SpO2: 94%   Weight: 220 lb (99.8 kg)   Height: '5\' 2"'  (1.575 m)    Results for orders placed or performed in visit on 08/03/18  POCT rapid strep A  Result Value Ref Range   Rapid Strep A Screen Negative Negative       Assessment & Plan:    [name] [name] is a 62 y.o. female Sore throat - Plan: POCT rapid strep A, Culture, Group A Strep  Exposure to strep throat - Plan: POCT rapid strep A, Culture, Group A Strep  No apparent exudative tonsillitis, no painful lymphadenopathy, no fever.  Did have exposure to strep pharyngitis by report, so will check throat culture, but with improvement of symptoms currently and based on exam decided against initial treatment.  RTC precautions if worsening symptoms.    No orders of the defined types were placed in this encounter.  Patient Instructions     Rapid strep test was negative/normal but I will check a throat culture.  As symptoms are improving, would not recommend any new medication at this time.  If any fevers, worsening sore throat, or other worsening symptoms, return for recheck.  Thank you for coming in today.   Sore Throat A sore throat is pain, burning, irritation, or scratchiness in the throat. When you have a sore throat, you may feel pain or tenderness in your throat when you swallow or talk. Many things can cause a sore throat, including:  An infection.  Seasonal allergies.  Dryness in the air.  Irritants, such as smoke or pollution.  Gastroesophageal reflux disease (GERD).  A tumor.  A sore throat is often the first sign of another sickness. It may happen with other symptoms, such as coughing, sneezing, fever, and swollen neck glands. Most sore throats go away without medical  treatment. Follow these instructions at home:  Take over-the-counter medicines only as told by your health care provider.  Drink enough fluids to keep your urine clear or pale yellow.  Rest as needed.  To help with pain, try: ? Sipping warm liquids, such as broth, herbal tea, or warm water. ? Eating or drinking cold or frozen liquids, such as frozen ice pops. ? Gargling with a salt-water mixture 3-4 times a day or as needed. To make a salt-water mixture, completely dissolve -1 tsp of salt in 1 cup of warm water. ? Sucking on hard candy or throat lozenges. ? Putting a cool-mist humidifier in your bedroom at night to moisten the air. ? Sitting in the bathroom with the door closed for 5-10 minutes while you run hot water in the shower.  Do not use any tobacco products, such as cigarettes, chewing tobacco, and e-cigarettes. If you need help quitting, ask your health care provider. Contact a health care provider if:  You have a fever for more than 2-3 days.  You have symptoms that last (are persistent) for more than 2-3 days.  Your throat does not get better within 7 days.  You have a fever and your symptoms suddenly get worse. Get help right away if:  You have difficulty breathing.  You cannot swallow fluids, soft foods, or your saliva.  You have increased swelling in your throat or neck.  You have persistent nausea and vomiting. This information is not intended to replace advice given to you by your health care provider.  Make sure you discuss any questions you have with your health care provider. Document Released: 11/04/2004 Document Revised: 05/23/2016 Document Reviewed: 07/18/2015 Elsevier Interactive Patient Education  [employer].   If you have lab work done today you will be contacted with your lab results within the next 2 weeks.  If you have not heard from [place] then please contact us. The fastest way to get your results is to register for My Chart.   IF you  received an x-ray today, you will receive an invoice from [facility]. Please contact [facility] at [phone] with questions or concerns regarding your invoice.   IF you received labwork today, you will receive an invoice from [facility]. Please contact [facility] at [phone] with questions or concerns regarding your invoice.   Our billing staff will not be able to assist you with questions regarding bills from these companies.  You will be contacted with the lab results as soon as they are available. The fastest way to get your results is to activate your My Chart account. Instructions are located on the last page of this paperwork. If you have not heard from [place] regarding the results in 2 weeks, please contact this office.       I personally performed the services described in this documentation, which was scribed in my presence. The recorded information has been reviewed and considered for accuracy and completeness, addended by me as needed, and agree with information above.  Signed,   Merri Ray, MD [facility].  08/03/18 6:37 PM

## 2018-08-06 LAB — CULTURE, GROUP A STREP: STREP A CULTURE: NEGATIVE

## 2018-08-23 ENCOUNTER — Telehealth: Payer: Self-pay | Admitting: Physician Assistant

## 2018-08-23 NOTE — Telephone Encounter (Signed)
LVM for pt to call the office and get rescheduled due to [facility] leaving the practice. I advised of the providers that are accepting new patients. If/When the pt calls back, please reschedule with [name] [name] or [place] for an OV previously scheduled  6 month F/U -diabetes.  Thank you!

## 2018-08-28 ENCOUNTER — Encounter: Payer: Self-pay | Admitting: Internal Medicine

## 2018-08-28 ENCOUNTER — Ambulatory Visit (INDEPENDENT_AMBULATORY_CARE_PROVIDER_SITE_OTHER): Payer: Self-pay | Admitting: Internal Medicine

## 2018-08-28 VITALS — BP 156/88 | HR 66 | Ht 62.0 in | Wt 219.8 lb

## 2018-08-28 DIAGNOSIS — E785 Hyperlipidemia, unspecified: Secondary | ICD-10-CM

## 2018-08-28 DIAGNOSIS — I2510 Atherosclerotic heart disease of native coronary artery without angina pectoris: Secondary | ICD-10-CM

## 2018-08-28 DIAGNOSIS — I10 Essential (primary) hypertension: Secondary | ICD-10-CM

## 2018-08-28 MED ORDER — METOPROLOL SUCCINATE ER 50 MG PO TB24: 50.0000 mg | ORAL_TABLET | Freq: Two times a day (BID) | ORAL | 2 refills | Status: DC

## 2018-08-28 MED ORDER — ATORVASTATIN CALCIUM 80 MG PO TABS: ORAL_TABLET | ORAL | 2 refills | Status: DC

## 2018-08-28 MED ORDER — LISINOPRIL 40 MG PO TABS: 40.0000 mg | ORAL_TABLET | Freq: Every day | ORAL | 2 refills | Status: DC

## 2018-08-28 NOTE — Patient Instructions (Signed)
Medication Instructions:  INCREASE LISINOPRIL TO 40mg  Daily  If you need a refill on your cardiac medications before your next appointment, please call your pharmacy.   Lab work: 2 weeks CMP/FASTING LIPID  If you have labs (blood work) drawn today and your tests are completely normal, you will receive your results only by: Marland Kitchen MyChart Message (if you have MyChart) OR . A paper copy in the mail If you have any lab test that is abnormal or we need to change your treatment, we will call you to review the results.  Testing/Procedures: NONE  Follow-Up: At [facility], you and your health needs are our priority.  As part of our continuing mission to provide you with exceptional heart care, we have created designated Provider Care Teams.  These Care Teams include your primary Cardiologist (physician) and Advanced Practice Providers (APPs -  Physician Assistants and Nurse Practitioners) who all work together to provide you with the care you need, when you need it. You will need a follow up appointment in 6 months in [place].  Please call our office 2 months in advance to schedule this appointment.  You may see Nelva Bush, MD or one of the following Advanced Practice Providers on your designated Care Team:   Murray Hodgkins, NP Christell Faith, PA-C . Marrianne Mood, PA-C  Any Other Special Instructions Will Be Listed Below (If Applicable).

## 2018-08-28 NOTE — Progress Notes (Signed)
Follow-up Outpatient Visit Date: 08/28/2018  Primary Care Provider: Donzetta Kohut [place] [id number]  Chief Complaint: Follow-up coronary artery disease  HPI:  [name] [name] is a 62 y.o. year-old female with history of coronary artery disease status post CABG (07/2016) with postoperative atrial fibrillation, hypertension, hyperlipidemia,HFpEF,sinus node dysfunction status post permanent pacemaker, and sleep apnea on CPAP, who presents for follow-up of coronary artery disease.  I last saw her in May, at which time she was doing well other than intermittent palpitations with associated shortness of breath.  BP was also running high at times.  We agreed to increase metoprolol succinate to 50 mg BID.  She was also referred to the lipid clinic due to suboptimal LDL control.  She was started on Praluent after receiving approval through the P[employer] program.  She was seen by Dr. Caryl Comes in EP clinic in September, at which time she reported feeling better after losing ~20 pounds.  Today, [name] [name] reports that she has continued to feel well.  She denies chest pain, shortness of breath, palpitations, and lightheadedness.  She is tolerating increased dose of metoprolol well.  However, she notes that her blood pressure still tends to be elevated with systolic readings in the 060 to 150 mmHg range.  CPAP was recently adjusted after her visit with Dr. Caryl Comes, though she has not noticed any change in her blood pressure readings.  --------------------------------------------------------------------------------------------------  Cardiovascular History & Procedures: Cardiovascular Problems:  Coronary artery disease status post CABG (07/2016)  HFpEF  Sinus node dysfunction status post permanent pacemaker  Risk Factors:  Known coronary artery disease, hypertension, hyperlipidemia, and obesity  Cath/PCI:  LHC (07/20/16): The LMCA normal. LAD with 90% proximal, 80% mid, and 70% distal  stenoses as well as 90% stenosis of the ostium of a large septal branch. LCx with 100% ostial/proximal occlusion with right to left collaterals. PL branch with 100% occlusion with filling via left-to-right collaterals. Mid anterior hypokinesis with LVEF of 50-55%.  CV Surgery:  CABG (07/25/16): LIMA to LAD, SVG to diagonal, and SVG to OM).  EP Procedures and Devices:  Dual-chamber pacemaker (09/10/08, Dr. Caryl Comes): [employer]  Non-Invasive Evaluation(s):  TTE (07/21/16): Normal LV size and wall thickness. LVEF 60-65% with normal wall motion. Grade 1 diastolic dysfunction. Mild left atrial enlargement. Normal RV size and function.  Exercise MPI (01/30/13): Low risk study without ischemia. Poor exercise capacity. Wide-complex tachycardia during stress (question rate related LBBB). LVEF 73%.  Recent CV Pertinent Labs: Lab Results  Component Value Date   CHOL 75 (L) 05/19/2018   HDL 43 05/19/2018   LDLCALC 14 05/19/2018   LDLDIRECT 195 (H) 04/18/2013   TRIG 88 05/19/2018   CHOLHDL 1.7 05/19/2018   CHOLHDL 6.5 07/21/2016   INR 1.33 07/22/2016   BNP 88.8 07/20/2016   BNP 196.3 (H) 06/27/2014   K 4.9 03/27/2018   MG 2.0 07/23/2016   BUN 18 03/27/2018   CREATININE 0.87 03/27/2018   CREATININE 0.92 07/20/2016    Past medical and surgical history were reviewed and updated in EPIC.  Current Meds  Medication Sig  . albuterol (PROVENTIL HFA;VENTOLIN HFA) 108 (90 Base) MCG/ACT inhaler Inhale into the lungs every 6 (six) hours as needed for wheezing or shortness of breath.  . Alirocumab (PRALUENT) 75 MG/ML SOPN Inject 75 mg into the skin every 14 (fourteen) days.  Marland Kitchen aspirin EC 81 MG tablet Take 81 mg by mouth daily.  Marland Kitchen atorvastatin (LIPITOR) 80 MG tablet TAKE ONE TABLET BY MOUTH  ONCE DAILY AT 6 PM  . cetirizine (ZYRTEC) 10 MG tablet Take 10 mg by mouth daily.    . citalopram (CELEXA) 20 MG tablet Take 1 tablet (20 mg total) by mouth daily.  . cyclobenzaprine (FLEXERIL) 10 MG  tablet TAKE 1 TABLET BY MOUTH AT BEDTIME AS NEEDED AND MAY REPEAT DOSE ONE TIME IF NEEDED FOR MUSCLE SPASMS, CAREFUL OF SEDATION  . fish oil-omega-3 fatty acids 1000 MG capsule Take 3 g by mouth 2 (two) times daily.   . fluticasone (FLONASE) 50 MCG/ACT nasal spray Place 2 sprays into both nostrils daily.  . fluticasone (FLOVENT HFA) 110 MCG/ACT inhaler Inhale 2 puffs into the lungs 2 (two) times daily.  Marland Kitchen gabapentin (NEURONTIN) 300 MG capsule TAKE 2 CAPSULES BY MOUTH IN THE MORNING AND 3 CAPS AT BEDTIME  . glimepiride (AMARYL) 4 MG tablet TAKE 1/2 (ONE-HALF) TABLET BY MOUTH TWICE DAILY  . glucose blood test strip Use as instructed  . lisinopril (PRINIVIL,ZESTRIL) 20 MG tablet TAKE 1 TABLET BY MOUTH ONCE DAILY  . metFORMIN (GLUCOPHAGE) 500 MG tablet Take 1 tablet (500 mg total) by mouth 2 (two) times daily with a meal.  . metoprolol succinate (TOPROL-XL) 50 MG 24 hr tablet Take 1 tablet (50 mg total) by mouth 2 (two) times daily. Take with or immediately following a meal.  . Multiple Vitamin (MULTIVITAMIN) tablet Take 1 tablet by mouth daily.  Marland Kitchen omeprazole (PRILOSEC) 20 MG capsule Take 1 capsule (20 mg total) by mouth daily.  . ondansetron (ZOFRAN-ODT) 8 MG disintegrating tablet DISSOLVE 1 ON TONGUE EVERY 12 HOURS AS NEEDED FOR NAUSEA  . traMADol (ULTRAM) 50 MG tablet Take 1 tablet (50 mg total) by mouth at bedtime as needed.  . triamcinolone cream (KENALOG) 0.1 % Apply 1 application topically as needed.    Allergies: Adhesive [tape]; Penicillins; Red yeast rice; and Sulfa drugs cross reactors  Social History   Tobacco Use  . Smoking status: Former Smoker    Types: Cigarettes    Last attempt to quit: 1995    Years since quitting: 24.8  . Smokeless tobacco: Never Used  . Tobacco comment: Quit 20 yrs ago  Substance Use Topics  . Alcohol use: No    Alcohol/week: 0.0 standard drinks  . Drug use: No    Family History  Problem Relation Age of Onset  . Heart disease Mother   . Cancer  Mother 44       lung  . Breast cancer Mother        maternal grandmother  . Diabetes Sister        type 2  . Breast cancer Brother   . Cancer Maternal Grandmother        breast  . Leukemia Paternal Grandmother        neoplast  . Aneurysm Paternal Grandfather        abdominal (stomach)  . Heart disease Father   . Colon cancer Neg Hx     Review of Systems: A 12-system review of systems was performed and was negative except as noted in the HPI.  --------------------------------------------------------------------------------------------------  Physical Exam: BP (!) 156/88   Pulse 66   Ht 5\' 2"  (1.575 m)   Wt 219 lb 12.8 oz (99.7 kg)   SpO2 94%   BMI 40.20 kg/m   General: NAD. HEENT: No conjunctival pallor or scleral icterus. Moist mucous membranes.  OP clear. Neck: Supple without lymphadenopathy, thyromegaly, JVD, or HJR. Lungs: Normal work of breathing.  Widely diminished breath sounds  throughout without wheezes or crackles.  Heart: Regular rate and rhythm without murmurs, rubs, or gallops.  Unable to assess PMI due to body habitus. Abd: Bowel sounds present. Soft, NT/ND without hepatosplenomegaly Ext: Trace pretibial edema. Radial, PT, and DP pulses are 2+ bilaterally. Skin: Warm and dry without rash.  Lab Results  Component Value Date   WBC WILL FOLLOW 09/15/2016   HGB WILL FOLLOW 09/15/2016   HCT WILL FOLLOW 09/15/2016   MCV WILL FOLLOW 09/15/2016   PLT WILL FOLLOW 09/15/2016    Lab Results  Component Value Date   NA 137 03/27/2018   K 4.9 03/27/2018   CL 101 03/27/2018   CO2 22 03/27/2018   BUN 18 03/27/2018   CREATININE 0.87 03/27/2018   GLUCOSE 424 (H) 03/27/2018   ALT 29 05/19/2018    Lab Results  Component Value Date   CHOL 75 (L) 05/19/2018   HDL 43 05/19/2018   LDLCALC 14 05/19/2018   LDLDIRECT 195 (H) 04/18/2013   TRIG 88 05/19/2018   CHOLHDL 1.7 05/19/2018     --------------------------------------------------------------------------------------------------  ASSESSMENT AND PLAN: Coronary artery disease without angina [name] [name] continues to do well without recurrent chest pain.  We will continue her current regimen for secondary prevention, including indefinite aspirin and lipid control.  Hypertension Blood pressure suboptimally controlled today.  We will increase lisinopril to 40 mg daily.  Continue dose of metoprolol tartrate.  I will check renal function and potassium today and again in about 2 weeks.  If blood pressure remains suboptimally controlled, addition of a thiazide diuretic would be my next choice.  Hyperlipidemia [name] [name] is currently on Praluent and atorvastatin with very low LDL.  We will plan to repeat a lipid panel with LFTs in about 2 weeks to see if Praluent or atorvastatin can be de-escalated.  Morbid obesity [name] [name] continues to lose weight through dietary modifications.  I have congratulated her on this and have encouraged her to continue with weight loss through diet and exercise.  Follow-up: Return to see me in [place] in 6 months.  Nelva Bush, MD 08/28/2018 11:16 AM

## 2018-09-05 ENCOUNTER — Encounter: Payer: Self-pay | Admitting: Cardiology

## 2018-09-11 ENCOUNTER — Other Ambulatory Visit: Payer: Self-pay | Admitting: *Deleted

## 2018-09-11 DIAGNOSIS — I10 Essential (primary) hypertension: Secondary | ICD-10-CM

## 2018-09-11 DIAGNOSIS — I2510 Atherosclerotic heart disease of native coronary artery without angina pectoris: Secondary | ICD-10-CM

## 2018-09-11 LAB — COMPREHENSIVE METABOLIC PANEL
ALBUMIN: 4.5 g/dL (ref 3.6–4.8)
ALT: 14 IU/L (ref 0–32)
AST: 17 IU/L (ref 0–40)
Albumin/Globulin Ratio: 1.7 (ref 1.2–2.2)
Alkaline Phosphatase: 120 IU/L — ABNORMAL HIGH (ref 39–117)
BUN / CREAT RATIO: 13 (ref 12–28)
BUN: 11 mg/dL (ref 8–27)
Bilirubin Total: 0.7 mg/dL (ref 0.0–1.2)
CALCIUM: 9.9 mg/dL (ref 8.7–10.3)
CO2: 26 mmol/L (ref 20–29)
CREATININE: 0.85 mg/dL (ref 0.57–1.00)
Chloride: 97 mmol/L (ref 96–106)
GFR calc Af Amer: 84 mL/min/{1.73_m2} (ref >59)
GFR, EST NON AFRICAN AMERICAN: 73 mL/min/{1.73_m2} (ref >59)
GLOBULIN, TOTAL: 2.7 g/dL (ref 1.5–4.5)
Glucose: 136 mg/dL — ABNORMAL HIGH (ref 65–99)
Potassium: 4.3 mmol/L (ref 3.5–5.2)
SODIUM: 142 mmol/L (ref 134–144)
TOTAL PROTEIN: 7.2 g/dL (ref 6.0–8.5)

## 2018-09-11 LAB — LIPID PANEL
CHOLESTEROL TOTAL: 136 mg/dL (ref 100–199)
Chol/HDL Ratio: 2.6 ratio (ref 0.0–4.4)
HDL: 53 mg/dL (ref >39)
LDL Calculated: 59 mg/dL (ref 0–99)
Triglycerides: 118 mg/dL (ref 0–149)
VLDL CHOLESTEROL CAL: 24 mg/dL (ref 5–40)

## 2018-09-14 ENCOUNTER — Telehealth: Payer: Self-pay

## 2018-09-14 NOTE — Telephone Encounter (Signed)
Left message for pt to expect a letter in the mail regarding pass application for praluent and to complete the highlighted sections and mail back into our office and also to let the office know of any insurance changes

## 2018-09-27 ENCOUNTER — Ambulatory Visit: Payer: Self-pay | Admitting: Physician Assistant

## 2018-10-19 ENCOUNTER — Other Ambulatory Visit: Payer: Self-pay

## 2018-10-19 ENCOUNTER — Encounter: Payer: Self-pay | Admitting: Emergency Medicine

## 2018-10-19 ENCOUNTER — Ambulatory Visit: Payer: Self-pay | Admitting: Emergency Medicine

## 2018-10-19 VITALS — BP 140/88 | HR 78 | Temp 98.6°F | Resp 16 | Ht 62.5 in | Wt 214.2 lb

## 2018-10-19 DIAGNOSIS — S161XXA Strain of muscle, fascia and tendon at neck level, initial encounter: Secondary | ICD-10-CM

## 2018-10-19 DIAGNOSIS — E1165 Type 2 diabetes mellitus with hyperglycemia: Secondary | ICD-10-CM

## 2018-10-19 DIAGNOSIS — E1142 Type 2 diabetes mellitus with diabetic polyneuropathy: Secondary | ICD-10-CM

## 2018-10-19 DIAGNOSIS — E118 Type 2 diabetes mellitus with unspecified complications: Secondary | ICD-10-CM

## 2018-10-19 DIAGNOSIS — G629 Polyneuropathy, unspecified: Secondary | ICD-10-CM

## 2018-10-19 DIAGNOSIS — IMO0002 Reserved for concepts with insufficient information to code with codable children: Secondary | ICD-10-CM

## 2018-10-19 DIAGNOSIS — Z23 Encounter for immunization: Secondary | ICD-10-CM

## 2018-10-19 LAB — GLUCOSE, POCT (MANUAL RESULT ENTRY): POC Glucose: 171 mg/dl — AB (ref 70–99)

## 2018-10-19 LAB — POCT GLYCOSYLATED HEMOGLOBIN (HGB A1C): Hemoglobin A1C: 6.1 % — AB (ref 4.0–5.6)

## 2018-10-19 MED ORDER — CYCLOBENZAPRINE HCL 10 MG PO TABS: ORAL_TABLET | ORAL | 1 refills | Status: DC

## 2018-10-19 MED ORDER — GABAPENTIN 300 MG PO CAPS: ORAL_CAPSULE | ORAL | 1 refills | Status: DC

## 2018-10-19 MED ORDER — TRAMADOL HCL 50 MG PO TABS: 50.0000 mg | ORAL_TABLET | Freq: Every evening | ORAL | 0 refills | Status: DC | PRN

## 2018-10-19 NOTE — Assessment & Plan Note (Signed)
Diabetes well controlled.  Hemoglobin A1c at 6.1 today.  Continue present medications and follow-up in 6 months.

## 2018-10-19 NOTE — Progress Notes (Signed)
Lab Results  Component Value Date   HGBA1C 9.7 (A) 03/27/2018   BP Readings from Last 3 Encounters:  10/19/18 (!) 149/77  08/28/18 (!) 156/88  08/03/18 (!) 146/92   Wt Readings from Last 3 Encounters:  10/19/18 214 lb 3.2 oz (97.2 kg)  08/28/18 219 lb 12.8 oz (99.7 kg)  08/03/18 220 lb (99.8 kg)   Lab Results  Component Value Date   CHOL 136 09/11/2018   HDL 53 09/11/2018   LDLCALC 59 09/11/2018   LDLDIRECT 195 (H) 04/18/2013   TRIG 118 09/11/2018   CHOLHDL 2.6 09/11/2018   [name] [name] 63 y.o.   Chief Complaint  Patient presents with  . Diabetes    follow up 6 months- per patient had HgA1c at [facility]  . Medication Refill    Gabapentin, Tramadol and Flexeril    HISTORY OF PRESENT ILLNESS: This is a 63 y.o. female with history of diabetes here for follow-up and medication refill.  Has no complaints or medical concerns today.  Highest sugar at home was 160.  Taking metformin twice a day.  First visit with me.  Also has a history of hypertension and dyslipidemia.  Has a history of peripheral neuropathy. You are up-to-date with diabetic eye exam. HPI   Prior to Admission medications   Medication Sig Start Date End Date Taking? Authorizing Provider  albuterol (PROVENTIL HFA;VENTOLIN HFA) 108 (90 Base) MCG/ACT inhaler Inhale into the lungs every 6 (six) hours as needed for wheezing or shortness of breath.   Yes [provider]  Alirocumab (PRALUENT) 75 MG/ML SOPN Inject 75 mg into the skin every 14 (fourteen) days. 03/14/18  Yes End, Harrell Gave, MD  atorvastatin (LIPITOR) 80 MG tablet TAKE ONE TABLET BY MOUTH ONCE DAILY AT 6 PM 08/28/18  Yes End, Harrell Gave, MD  cetirizine (ZYRTEC) 10 MG tablet Take 10 mg by mouth daily.     Yes [provider]  citalopram (CELEXA) 20 MG tablet Take 1 tablet (20 mg total) by mouth daily. 03/27/18  Yes Timmothy Euler, [place] D, PA-C  cyclobenzaprine (FLEXERIL) 10 MG tablet TAKE 1 TABLET BY MOUTH AT BEDTIME AS NEEDED  AND MAY REPEAT DOSE ONE TIME IF NEEDED FOR MUSCLE SPASMS, CAREFUL OF SEDATION 07/15/17  Yes English, Stephanie D, PA  fish oil-omega-3 fatty acids 1000 MG capsule Take 3 g by mouth 2 (two) times daily.    Yes [provider]  fluticasone (FLONASE) 50 MCG/ACT nasal spray Place 2 sprays into both nostrils daily. 04/26/16  Yes Ezekiel Slocumb, PA-C  fluticasone (FLOVENT HFA) 110 MCG/ACT inhaler Inhale 2 puffs into the lungs 2 (two) times daily. 04/26/16  Yes Ezekiel Slocumb, PA-C  gabapentin (NEURONTIN) 300 MG capsule TAKE 2 CAPSULES BY MOUTH IN THE MORNING AND 3 CAPS AT BEDTIME 11/15/17  Yes English, Stephanie D, PA  glimepiride (AMARYL) 4 MG tablet TAKE 1/2 (ONE-HALF) TABLET BY MOUTH TWICE DAILY 10/20/17  Yes English, [place] D, PA  glucose blood test strip Use as instructed 03/06/15  Yes Barton Fanny, MD  lisinopril (PRINIVIL,ZESTRIL) 40 MG tablet Take 1 tablet (40 mg total) by mouth daily. 08/28/18 11/26/18 Yes End, Harrell Gave, MD  metFORMIN (GLUCOPHAGE) 500 MG tablet Take 1 tablet (500 mg total) by mouth 2 (two) times daily with a meal. 03/27/18  Yes Timmothy Euler, [place] D, PA-C  metoprolol succinate (TOPROL-XL) 50 MG 24 hr tablet Take 1 tablet (50 mg total) by mouth 2 (two) times daily. Take with or immediately following a meal. 08/28/18  Yes End,  Harrell Gave, MD  Multiple Vitamin (MULTIVITAMIN) tablet Take 1 tablet by mouth daily. 08/30/16  Yes Lars Pinks M, PA-C  omeprazole (PRILOSEC) 20 MG capsule Take 1 capsule (20 mg total) by mouth daily. 12/15/16  Yes English, Colletta [place] D, PA  ondansetron (ZOFRAN-ODT) 8 MG disintegrating tablet DISSOLVE 1 ON TONGUE EVERY 12 HOURS AS NEEDED FOR NAUSEA 03/27/18  Yes Timmothy Euler, [place] D, PA-C  traMADol (ULTRAM) 50 MG tablet Take 1 tablet (50 mg total) by mouth at bedtime as needed. 10/20/17  Yes English, Colletta [place] D, PA  triamcinolone cream (KENALOG) 0.1 % Apply 1 application topically as needed. 04/26/16  Yes Ezekiel Slocumb, PA-C  aspirin EC 81 MG  tablet Take 81 mg by mouth daily.    [provider]    Allergies  Allergen Reactions  . Adhesive [Tape] Other (See Comments)    SKIN BLISTERS  . Penicillins     Swelling   . Red Yeast Rice Hives  . Sulfa Drugs Cross Reactors Hives    Patient Active Problem List   Diagnosis Date Noted  . Palpitations 02/14/2018  . Coronary artery disease involving native coronary artery of native heart without angina pectoris 02/14/2018  . Morbid obesity ([place]) 02/14/2018  . Irritable bowel syndrome 06/28/2017  . S/P CABG x 3 07/22/2016  . Multiple vessel coronary artery disease 07/21/2016  . STEMI involving left circumflex coronary artery ([place]) 07/20/2016  . Asthmatic bronchitis 04/26/2016  . Allergic urticaria 04/26/2016  . Environmental allergies 04/26/2016  . Neck pain 04/26/2016  . Anxiety 04/26/2016  . Sinoatrial node dysfunction (HCC) 04/26/2016  . Pacemaker Medtronic   . Ventricular tachycardia, nonsustained-during exercise testing 03/07/2013  . OSA (obstructive sleep apnea) 05/25/2011  . Hx of adenomatous colonic polyps 05/25/2011  . Migraine 05/25/2011  . Diabetes mellitus type 2, uncontrolled, with complications ([place]) 83/38/2505  . GERD (gastroesophageal reflux disease) 05/25/2011  . NAFLD (nonalcoholic fatty liver disease) 05/25/2011  . Hyperlipidemia LDL goal <70 05/25/2011  . Essential hypertension 09/16/2009  . DIASTOLIC HEART FAILURE, CHRONIC 09/16/2009    Past Medical History:  Diagnosis Date  . Allergy   . Anal fissure   . Anxiety   . Arthritis   . Asthma   . Cataract 2010    left  . Colon polyps    diverticulosis  03-2011/2005  . Depression   . Diabetes mellitus 2012   T2DM  . Diverticulosis   . Fatty liver 04/2011  . GERD (gastroesophageal reflux disease)   . Hyperlipidemia   . Hypertension severe   . Migraine   . Obesity   . OSA (obstructive sleep apnea)    C-Pap  . Pacemaker Medtronic   . Sinoatrial node dysfunction (HCC)    syncope     Past Surgical History:  Procedure Laterality Date  . ABDOMINAL HYSTERECTOMY  2000   total  . CARDIAC CATHETERIZATION N/A 07/20/2016   Procedure: Left Heart Cath and Coronary Angiography;  Surgeon: Peter M Martinique, MD;  Location: [place] [facility];  Service: Cardiovascular;  Laterality: N/A;  . CORONARY ARTERY BYPASS GRAFT N/A 07/22/2016   Procedure: CORONARY ARTERY BYPASS GRAFTING (CABG) x three , using left internal mammary artery and right leg greater saphenous vein harvested endoscopically;  Surgeon: Ivin Poot, MD;  Location: [place];  Service: Open Heart Surgery;  Laterality: N/A;  . EYE SURGERY Right 2010  . OOPHORECTOMY    . PACEMAKER PLACEMENT    . T/A right knee    . TEE WITHOUT CARDIOVERSION N/A 07/22/2016  Procedure: TRANSESOPHAGEAL ECHOCARDIOGRAM (TEE);  Surgeon: Ivin Poot, MD;  Location: [place];  Service: Open Heart Surgery;  Laterality: N/A;  . TONSILLECTOMY      Social History   Socioeconomic History  . Marital status: Single    Spouse name: Not on file  . Number of children: 0  . Years of education: Not on file  . Highest education level: Not on file  Occupational History  . Occupation: Surveyor, quantity: [employer]  . Financial resource strain: Not on file  . Food insecurity:    Worry: Not on file    Inability: Not on file  . Transportation needs:    Medical: Not on file    Non-medical: Not on file  Tobacco Use  . Smoking status: Former Smoker    Types: Cigarettes    Last attempt to quit: 1995    Years since quitting: 25.0  . Smokeless tobacco: Never Used  . Tobacco comment: Quit 20 yrs ago  Substance and Sexual Activity  . Alcohol use: No    Alcohol/week: 0.0 standard drinks  . Drug use: No  . Sexual activity: Yes    Birth control/protection: Post-menopausal, Other-see comments    Comment: Monogamous   Lifestyle  . Physical activity:    Days per week: Not on file    Minutes per session: Not on file  .  Stress: Not on file  Relationships  . Social connections:    Talks on phone: Not on file    Gets together: Not on file    Attends religious service: Not on file    Active member of club or organization: Not on file    Attends meetings of clubs or organizations: Not on file    Relationship status: Not on file  . Intimate partner violence:    Fear of current or ex partner: Not on file    Emotionally abused: Not on file    Physically abused: Not on file    Forced sexual activity: Not on file  Other Topics Concern  . Not on file  Social History Narrative   Single. Exercise: No. Education: College.    Family History  Problem Relation Age of Onset  . Heart disease Mother   . Cancer Mother 27       lung  . Breast cancer Mother        maternal grandmother  . Diabetes Sister        type 2  . Breast cancer Brother   . Cancer Maternal Grandmother        breast  . Leukemia Paternal Grandmother        neoplast  . Aneurysm Paternal Grandfather        abdominal (stomach)  . Heart disease Father   . Colon cancer Neg Hx      Review of Systems  Constitutional: Negative.  Negative for chills and fever.  HENT: Negative.  Negative for hearing loss.   Eyes: Negative.  Negative for blurred vision and double vision.  Respiratory: Negative.  Negative for cough and shortness of breath.   Cardiovascular: Negative.  Negative for chest pain and palpitations.  Gastrointestinal: Negative.  Negative for abdominal pain, diarrhea, nausea and vomiting.  Genitourinary: Negative.  Negative for dysuria and hematuria.  Musculoskeletal: Negative.  Negative for myalgias and neck pain.  Skin: Positive for rash (Diffuse warts mostly extremities).  Neurological: Negative.  Negative for dizziness and headaches.  Endo/Heme/Allergies: Negative.  All other systems reviewed and are negative.  Vitals:   10/19/18 1409  BP: (!) 149/77  Pulse: 78  Resp: 16  Temp: 98.6 F (37 C)  SpO2: 95%     Physical  Exam Vitals signs reviewed.  Constitutional:      Appearance: Normal appearance.  HENT:     Head: Normocephalic and atraumatic.     Nose: Nose normal.     Mouth/Throat:     Mouth: Mucous membranes are moist.     Pharynx: Oropharynx is clear.  Eyes:     Extraocular Movements: Extraocular movements intact.     Conjunctiva/sclera: Conjunctivae normal.     Pupils: Pupils are equal, round, and reactive to light.  Neck:     Musculoskeletal: Normal range of motion and neck supple.  Cardiovascular:     Rate and Rhythm: Normal rate and regular rhythm.     Pulses: Normal pulses.     Heart sounds: Normal heart sounds.  Pulmonary:     Breath sounds: Normal breath sounds.  Abdominal:     General: Abdomen is flat.     Tenderness: There is no abdominal tenderness.  Musculoskeletal: Normal range of motion.  Skin:    General: Skin is warm and dry.  Neurological:     General: No focal deficit present.     Mental Status: She is alert and oriented to person, place, and time.  Psychiatric:        Mood and Affect: Mood normal.        Behavior: Behavior normal.      Results for orders placed or performed in visit on 10/19/18 (from the past 24 hour(s))  POCT glucose (manual entry)     Status: Abnormal   Collection Time: 10/19/18  2:58 PM  Result Value Ref Range   POC Glucose 171 (A) 70 - 99 mg/dl  POCT glycosylated hemoglobin (Hb A1C)     Status: Abnormal   Collection Time: 10/19/18  3:04 PM  Result Value Ref Range   Hemoglobin A1C 6.1 (A) 4.0 - 5.6 %   HbA1c POC (<> result, manual entry)     HbA1c, POC (prediabetic range)     HbA1c, POC (controlled diabetic range)       ASSESSMENT & PLAN: Diabetes mellitus type 2, uncontrolled, with complications ([place]) Diabetes well controlled.  Hemoglobin A1c at 6.1 today.  Continue present medications and follow-up in 6 months.  [name] [name] was seen today for diabetes and medication refill.  Diagnoses and all orders for this visit:  Type 2  diabetes mellitus with diabetic polyneuropathy, without long-term current use of insulin (HCC) -     POCT glucose (manual entry) -     POCT glycosylated hemoglobin (Hb A1C) -     HM Diabetes Foot Exam -     gabapentin (NEURONTIN) 300 MG capsule; TAKE 2 CAPSULES BY MOUTH IN THE MORNING AND 3 CAPS AT BEDTIME  Need for prophylactic vaccination and inoculation against influenza -     Flu Vaccine QUAD 36+ mos IM  Neuropathy -     gabapentin (NEURONTIN) 300 MG capsule; TAKE 2 CAPSULES BY MOUTH IN THE MORNING AND 3 CAPS AT BEDTIME -     traMADol (ULTRAM) 50 MG tablet; Take 1 tablet (50 mg total) by mouth at bedtime as needed.  Strain of neck muscle, initial encounter -     cyclobenzaprine (FLEXERIL) 10 MG tablet; TAKE 1 TABLET BY MOUTH AT BEDTIME AS NEEDED AND MAY REPEAT DOSE ONE TIME IF NEEDED  FOR MUSCLE SPASMS, CAREFUL OF SEDATION  Diabetes mellitus type 2, uncontrolled, with complications [facility])    Patient Instructions  Diabetes Mellitus and Nutrition, Adult When you have diabetes (diabetes mellitus), it is very important to have healthy eating habits because your blood sugar (glucose) levels are greatly affected by what you eat and drink. Eating healthy foods in the appropriate amounts, at about the same times every day, can help you:  Control your blood glucose.  Lower your risk of heart disease.  Improve your blood pressure.  Reach or maintain a healthy weight. Every person with diabetes is different, and each person has different needs for a meal plan. Your health care provider may recommend that you work with a diet and nutrition specialist (dietitian) to make a meal plan that is best for you. Your meal plan may vary depending on factors such as:  The calories you need.  The medicines you take.  Your weight.  Your blood glucose, blood pressure, and cholesterol levels.  Your activity level.  Other health conditions you have, such as heart or kidney disease. How do  carbohydrates affect me? Carbohydrates, also called carbs, affect your blood glucose level more than any other type of food. Eating carbs naturally raises the amount of glucose in your blood. Carb counting is a method for keeping track of how many carbs you eat. Counting carbs is important to keep your blood glucose at a healthy level, especially if you use insulin or take certain oral diabetes medicines. It is important to know how many carbs you can safely have in each meal. This is different for every person. Your dietitian can help you calculate how many carbs you should have at each meal and for each snack. Foods that contain carbs include:  Bread, cereal, rice, pasta, and crackers.  Potatoes and corn.  Peas, beans, and lentils.  Milk and yogurt.  Fruit and juice.  Desserts, such as cakes, cookies, ice cream, and candy. How does alcohol affect me? Alcohol can cause a sudden decrease in blood glucose (hypoglycemia), especially if you use insulin or take certain oral diabetes medicines. Hypoglycemia can be a life-threatening condition. Symptoms of hypoglycemia (sleepiness, dizziness, and confusion) are similar to symptoms of having too much alcohol. If your health care provider says that alcohol is safe for you, follow these guidelines:  Limit alcohol intake to no more than 1 drink per day for nonpregnant women and 2 drinks per day for men. One drink equals 12 oz of beer, 5 oz of wine, or 1 oz of hard liquor.  Do not drink on an empty stomach.  Keep yourself hydrated with water, diet soda, or unsweetened iced tea.  Keep in mind that regular soda, juice, and other mixers may contain a lot of sugar and must be counted as carbs. What are tips for following this plan?  Reading food labels  Start by checking the serving size on the "Nutrition Facts" label of packaged foods and drinks. The amount of calories, carbs, fats, and other nutrients listed on the label is based on one serving of  the item. Many items contain more than one serving per package.  Check the total grams (g) of carbs in one serving. You can calculate the number of servings of carbs in one serving by dividing the total carbs by 15. For example, if a food has 30 g of total carbs, it would be equal to 2 servings of carbs.  Check the number of grams (g) of saturated  and trans fats in one serving. Choose foods that have low or no amount of these fats.  Check the number of milligrams (mg) of salt (sodium) in one serving. Most people should limit total sodium intake to less than 2,300 mg per day.  Always check the nutrition information of foods labeled as "low-fat" or "nonfat". These foods may be higher in added sugar or refined carbs and should be avoided.  Talk to your dietitian to identify your daily goals for nutrients listed on the label. Shopping  Avoid buying canned, premade, or processed foods. These foods tend to be high in fat, sodium, and added sugar.  Shop around the outside edge of the grocery store. This includes fresh fruits and vegetables, bulk grains, fresh meats, and fresh dairy. Cooking  Use low-heat cooking methods, such as baking, instead of high-heat cooking methods like deep frying.  Cook using healthy oils, such as olive, canola, or sunflower oil.  Avoid cooking with butter, cream, or high-fat meats. Meal planning  Eat meals and snacks regularly, preferably at the same times every day. Avoid going long periods of time without eating.  Eat foods high in fiber, such as fresh fruits, vegetables, beans, and whole grains. Talk to your dietitian about how many servings of carbs you can eat at each meal.  Eat 4-6 ounces (oz) of lean protein each day, such as lean meat, chicken, fish, eggs, or tofu. One oz of lean protein is equal to: ? 1 oz of meat, chicken, or fish. ? 1 egg. ?  cup of tofu.  Eat some foods each day that contain healthy fats, such as avocado, nuts, seeds, and  fish. Lifestyle  Check your blood glucose regularly.  Exercise regularly as told by your health care provider. This may include: ? 150 minutes of moderate-intensity or vigorous-intensity exercise each week. This could be brisk walking, biking, or water aerobics. ? Stretching and doing strength exercises, such as yoga or weightlifting, at least 2 times a week.  Take medicines as told by your health care provider.  Do not use any products that contain nicotine or tobacco, such as cigarettes and e-cigarettes. If you need help quitting, ask your health care provider.  Work with a Social worker or diabetes educator to identify strategies to manage stress and any emotional and social challenges. Questions to ask a health care provider  Do I need to meet with a diabetes educator?  Do I need to meet with a dietitian?  What number can I call if I have questions?  When are the best times to check my blood glucose? Where to find more information:  American Diabetes Association: diabetes.org  Academy of Nutrition and Dietetics: www.eatright.[employer] of Diabetes and Digestive and Kidney Diseases (NIH): [employer] Summary  A healthy meal plan will help you control your blood glucose and maintain a healthy lifestyle.  Working with a diet and nutrition specialist (dietitian) can help you make a meal plan that is best for you.  Keep in mind that carbohydrates (carbs) and alcohol have immediate effects on your blood glucose levels. It is important to count carbs and to use alcohol carefully. This information is not intended to replace advice given to you by your health care provider. Make sure you discuss any questions you have with your health care provider. Document Released: 06/24/2005 Document Revised: 04/27/2017 Document Reviewed: 11/01/2016 Elsevier Interactive Patient Education  2019 Elsevier Inc.      Agustina Caroli, MD [facility]

## 2018-10-19 NOTE — Patient Instructions (Signed)
Diabetes Mellitus and Nutrition, Adult  When you have diabetes (diabetes mellitus), it is very important to have healthy eating habits because your blood sugar (glucose) levels are greatly affected by what you eat and drink. Eating healthy foods in the appropriate amounts, at about the same times every day, can help you:  · Control your blood glucose.  · Lower your risk of heart disease.  · Improve your blood pressure.  · Reach or maintain a healthy weight.  Every person with diabetes is different, and each person has different needs for a meal plan. Your health care provider may recommend that you work with a diet and nutrition specialist (dietitian) to make a meal plan that is best for you. Your meal plan may vary depending on factors such as:  · The calories you need.  · The medicines you take.  · Your weight.  · Your blood glucose, blood pressure, and cholesterol levels.  · Your activity level.  · Other health conditions you have, such as heart or kidney disease.  How do carbohydrates affect me?  Carbohydrates, also called carbs, affect your blood glucose level more than any other type of food. Eating carbs naturally raises the amount of glucose in your blood. Carb counting is a method for keeping track of how many carbs you eat. Counting carbs is important to keep your blood glucose at a healthy level, especially if you use insulin or take certain oral diabetes medicines.  It is important to know how many carbs you can safely have in each meal. This is different for every person. Your dietitian can help you calculate how many carbs you should have at each meal and for each snack.  Foods that contain carbs include:  · Bread, cereal, rice, pasta, and crackers.  · Potatoes and corn.  · Peas, beans, and lentils.  · Milk and yogurt.  · Fruit and juice.  · Desserts, such as cakes, cookies, ice cream, and candy.  How does alcohol affect me?  Alcohol can cause a sudden decrease in blood glucose (hypoglycemia),  especially if you use insulin or take certain oral diabetes medicines. Hypoglycemia can be a life-threatening condition. Symptoms of hypoglycemia (sleepiness, dizziness, and confusion) are similar to symptoms of having too much alcohol.  If your health care provider says that alcohol is safe for you, follow these guidelines:  · Limit alcohol intake to no more than 1 drink per day for nonpregnant women and 2 drinks per day for men. One drink equals 12 oz of beer, 5 oz of wine, or 1½ oz of hard liquor.  · Do not drink on an empty stomach.  · Keep yourself hydrated with water, diet soda, or unsweetened iced tea.  · Keep in mind that regular soda, juice, and other mixers may contain a lot of sugar and must be counted as carbs.  What are tips for following this plan?    Reading food labels  · Start by checking the serving size on the "Nutrition Facts" label of packaged foods and drinks. The amount of calories, carbs, fats, and other nutrients listed on the label is based on one serving of the item. Many items contain more than one serving per package.  · Check the total grams (g) of carbs in one serving. You can calculate the number of servings of carbs in one serving by dividing the total carbs by 15. For example, if a food has 30 g of total carbs, it would be equal to 2   servings of carbs.  · Check the number of grams (g) of saturated and trans fats in one serving. Choose foods that have low or no amount of these fats.  · Check the number of milligrams (mg) of salt (sodium) in one serving. Most people should limit total sodium intake to less than 2,300 mg per day.  · Always check the nutrition information of foods labeled as "low-fat" or "nonfat". These foods may be higher in added sugar or refined carbs and should be avoided.  · Talk to your dietitian to identify your daily goals for nutrients listed on the label.  Shopping  · Avoid buying canned, premade, or processed foods. These foods tend to be high in fat, sodium,  and added sugar.  · Shop around the outside edge of the grocery store. This includes fresh fruits and vegetables, bulk grains, fresh meats, and fresh dairy.  Cooking  · Use low-heat cooking methods, such as baking, instead of high-heat cooking methods like deep frying.  · Cook using healthy oils, such as olive, canola, or sunflower oil.  · Avoid cooking with butter, cream, or high-fat meats.  Meal planning  · Eat meals and snacks regularly, preferably at the same times every day. Avoid going long periods of time without eating.  · Eat foods high in fiber, such as fresh fruits, vegetables, beans, and whole grains. Talk to your dietitian about how many servings of carbs you can eat at each meal.  · Eat 4-6 ounces (oz) of lean protein each day, such as lean meat, chicken, fish, eggs, or tofu. One oz of lean protein is equal to:  ? 1 oz of meat, chicken, or fish.  ? 1 egg.  ? ¼ cup of tofu.  · Eat some foods each day that contain healthy fats, such as avocado, nuts, seeds, and fish.  Lifestyle  · Check your blood glucose regularly.  · Exercise regularly as told by your health care provider. This may include:  ? 150 minutes of moderate-intensity or vigorous-intensity exercise each week. This could be brisk walking, biking, or water aerobics.  ? Stretching and doing strength exercises, such as yoga or weightlifting, at least 2 times a week.  · Take medicines as told by your health care provider.  · Do not use any products that contain nicotine or tobacco, such as cigarettes and e-cigarettes. If you need help quitting, ask your health care provider.  · Work with a counselor or diabetes educator to identify strategies to manage stress and any emotional and social challenges.  Questions to ask a health care provider  · Do I need to meet with a diabetes educator?  · Do I need to meet with a dietitian?  · What number can I call if I have questions?  · When are the best times to check my blood glucose?  Where to find more  information:  · American Diabetes Association: diabetes.org  · Academy of Nutrition and Dietetics: www.eatright.org  · National Institute of Diabetes and Digestive and Kidney Diseases (NIH): www.niddk.nih.gov  Summary  · A healthy meal plan will help you control your blood glucose and maintain a healthy lifestyle.  · Working with a diet and nutrition specialist (dietitian) can help you make a meal plan that is best for you.  · Keep in mind that carbohydrates (carbs) and alcohol have immediate effects on your blood glucose levels. It is important to count carbs and to use alcohol carefully.  This information is not intended to   replace advice given to you by your health care provider. Make sure you discuss any questions you have with your health care provider.  Document Released: 06/24/2005 Document Revised: 04/27/2017 Document Reviewed: 11/01/2016  Elsevier Interactive Patient Education © 2019 [employer]

## 2018-11-02 ENCOUNTER — Encounter: Payer: Self-pay | Admitting: Emergency Medicine

## 2018-11-25 ENCOUNTER — Encounter: Payer: Self-pay | Admitting: Osteopathic Medicine

## 2018-11-25 ENCOUNTER — Other Ambulatory Visit: Payer: Self-pay

## 2018-11-25 ENCOUNTER — Ambulatory Visit: Payer: Self-pay | Admitting: Osteopathic Medicine

## 2018-11-25 VITALS — BP 109/72 | HR 96 | Temp 98.9°F | Resp 20 | Ht 63.39 in | Wt 203.6 lb

## 2018-11-25 DIAGNOSIS — J029 Acute pharyngitis, unspecified: Secondary | ICD-10-CM

## 2018-11-25 MED ORDER — IPRATROPIUM BROMIDE 0.06 % NA SOLN: 2.0000 | Freq: Four times a day (QID) | NASAL | 1 refills | Status: DC

## 2018-11-25 MED ORDER — LIDOCAINE VISCOUS HCL 2 % MT SOLN: 5.0000 mL | OROMUCOSAL | 0 refills | Status: DC | PRN

## 2018-11-25 NOTE — Progress Notes (Signed)
HPI: [name] [name] is a 63 y.o. female who  has a past medical history of Allergy, Anal fissure, Anxiety, Arthritis, Asthma, Cataract (2010 ), Colon polyps, Depression, Diabetes mellitus (2012), Diverticulosis, Fatty liver (04/2011), GERD (gastroesophageal reflux disease), Hyperlipidemia, Hypertension severe, Migraine, Obesity, OSA (obstructive sleep apnea), Pacemaker Medtronic, and Sinoatrial node dysfunction ([place]).  she presents to [place] [facility] today, 11/25/18,  for chief complaint of:  Chief Complaint  Patient presents with  . Sore Throat    X 4 days  sore throat when swallowing     . Location/Quality: L throat soreness - points to mid-neck on the L . Duration: 4 days . Timing: really only with swallowing . Modifying factors: no PTC meds tried . Assoc signs/symptoms: nasal drainage       Past medical history, surgical history, and family history reviewed.  Current medication list and allergy/intolerance information reviewed.   (See remainder of HPI, ROS, Phys Exam below)     ASSESSMENT/PLAN:  The encounter diagnosis was Acute sore throat.  ?d/t muscle spasm w/ swallowing vs esophageal irritation.  Strong consideration for ENT/GI f/u if no timproving, esp given smoking hx. Would probably first try treating w/ abx for sinusitis if c/o sinus pressure/draiange worsens    Meds ordered this encounter  Medications  . lidocaine (XYLOCAINE) 2 % solution    Sig: Use as directed 5-10 mLs in the mouth or throat every 3 (three) hours as needed.    Dispense:  100 mL    Refill:  0  . ipratropium (ATROVENT) 0.06 % nasal spray    Sig: Place 2 sprays into both nostrils 4 (four) times daily.    Dispense:  15 mL    Refill:  1    Patient Instructions    I think there is some irritation to the lining of the esophagus, possible muscle strain. Let's treat the sinus drainage w/ the Atrovent nasal spray, you can also add OTC Zyrtec. Will also treat with lidocaine  numbing syrup, use as directed w/ the syringe. Soft/liquid, non-salty diet for next week. If not better, or worse/change, please let us know - we may need to get a specialist involved!                        Follow-up plan: Return if symptoms worsen or fail to improve.                                       ############################################ ############################################ ############################################ ############################################    Outpatient Encounter Medications as of 11/25/2018  Medication Sig Note  . albuterol (PROVENTIL HFA;VENTOLIN HFA) 108 (90 Base) MCG/ACT inhaler Inhale into the lungs every 6 (six) hours as needed for wheezing or shortness of breath.   . Alirocumab (PRALUENT) 75 MG/ML SOPN Inject 75 mg into the skin every 14 (fourteen) days.   Marland Kitchen aspirin EC 81 MG tablet Take 81 mg by mouth daily.   Marland Kitchen atorvastatin (LIPITOR) 80 MG tablet TAKE ONE TABLET BY MOUTH ONCE DAILY AT 6 PM   . cetirizine (ZYRTEC) 10 MG tablet Take 10 mg by mouth daily.     . citalopram (CELEXA) 20 MG tablet Take 1 tablet (20 mg total) by mouth daily.   . cyclobenzaprine (FLEXERIL) 10 MG tablet TAKE 1 TABLET BY MOUTH AT BEDTIME AS NEEDED AND MAY REPEAT DOSE ONE TIME IF NEEDED FOR MUSCLE  SPASMS, CAREFUL OF SEDATION   . fish oil-omega-3 fatty acids 1000 MG capsule Take 3 g by mouth 2 (two) times daily.    . fluticasone (FLONASE) 50 MCG/ACT nasal spray Place 2 sprays into both nostrils daily. 10/19/2018: Over the counter  . fluticasone (FLOVENT HFA) 110 MCG/ACT inhaler Inhale 2 puffs into the lungs 2 (two) times daily.   Marland Kitchen gabapentin (NEURONTIN) 300 MG capsule TAKE 2 CAPSULES BY MOUTH IN THE MORNING AND 3 CAPS AT BEDTIME   . glimepiride (AMARYL) 4 MG tablet TAKE 1/2 (ONE-HALF) TABLET BY MOUTH TWICE DAILY   . glucose blood test strip Use as instructed   . lisinopril (PRINIVIL,ZESTRIL) 40 MG tablet Take 1  tablet (40 mg total) by mouth daily.   . metFORMIN (GLUCOPHAGE) 500 MG tablet Take 1 tablet (500 mg total) by mouth 2 (two) times daily with a meal.   . metoprolol succinate (TOPROL-XL) 50 MG 24 hr tablet Take 1 tablet (50 mg total) by mouth 2 (two) times daily. Take with or immediately following a meal.   . Multiple Vitamin (MULTIVITAMIN) tablet Take 1 tablet by mouth daily.   Marland Kitchen omeprazole (PRILOSEC) 20 MG capsule Take 1 capsule (20 mg total) by mouth daily.   . ondansetron (ZOFRAN-ODT) 8 MG disintegrating tablet DISSOLVE 1 ON TONGUE EVERY 12 HOURS AS NEEDED FOR NAUSEA   . traMADol (ULTRAM) 50 MG tablet Take 1 tablet (50 mg total) by mouth at bedtime as needed.   . triamcinolone cream (KENALOG) 0.1 % Apply 1 application topically as needed.   Marland Kitchen ipratropium (ATROVENT) 0.06 % nasal spray Place 2 sprays into both nostrils 4 (four) times daily.   Marland Kitchen lidocaine (XYLOCAINE) 2 % solution Use as directed 5-10 mLs in the mouth or throat every 3 (three) hours as needed.    No facility-administered encounter medications on file as of 11/25/2018.    Allergies  Allergen Reactions  . Adhesive [Tape] Other (See Comments)    SKIN BLISTERS  . Penicillins     Swelling   . Red Yeast Rice Hives  . Sulfa Drugs Cross Reactors Hives      Review of Systems:  Constitutional: No recent illness  HEENT: No  headache, no vision change, +sore throat per HPI, +nasal drainage and sinus pressure  Cardiac: No  chest pain, No  pressure, No palpitations  Respiratory:  No  shortness of breath. No  Cough  Gastrointestinal: No  abdominal pain, no change on bowel habits  Musculoskeletal: No new myalgia/arthralgia  Skin: No  Rash  Hem/Onc: No  easy bruising/bleeding, No  abnormal lumps/bumps  Neurologic: No  weakness, No  Dizziness  Psychiatric: No  concerns with depression, No  concerns with anxiety  Exam:  BP 109/72   Pulse 96   Temp 98.9 F (37.2 C) (Oral)   Resp 20   Ht 5' 3.39" (1.61 m)   Wt 203 lb  9.6 oz (92.4 kg)   SpO2 96%   BMI 35.63 kg/m   Constitutional: VS see above. General Appearance: alert, well-developed, well-nourished, NAD  Eyes: Normal lids and conjunctive, non-icteric sclera  Ears, Nose, Mouth, Throat: MMM, Normal external inspection ears/nares/mouth/lips/gums. TM WNL bilaterally w/ clear effusion visible both sides. Pharynx (+)postnasal drip   Neck: No masses, trachea midline.  No lymphadenopathy or neck tenderness, normal ROM.    Respiratory: Normal respiratory effort. no wheeze, no rhonchi, no rales  Cardiovascular: S1/S2 normal, no murmur, no rub/gallop auscultated. RRR.   Musculoskeletal: Gait normal. Symmetric and independent movement of  all extremities  Neurological: Normal balance/coordination. No tremor.  Skin: warm, dry, intact.   Psychiatric: Normal judgment/insight. Normal mood and affect. Oriented x3.   Visit summary with medication list and pertinent instructions was printed for patient to review, advised to alert [place] if any changes needed. All questions at time of visit were answered - patient instructed to contact office with any additional concerns. ER/RTC precautions were reviewed with the patient and understanding verbalized.   Follow-up plan: Return if symptoms worsen or fail to improve.     Please note: voice recognition software was used to produce this document, and typos may escape review. Please contact Dr. Sheppard Coil for any needed clarifications.

## 2018-11-25 NOTE — Patient Instructions (Addendum)
  I think there is some irritation to the lining of the esophagus, possible muscle strain. Let's treat the sinus drainage w/ the Atrovent nasal spray, you can also add OTC Zyrtec. Will also treat with lidocaine numbing syrup, use as directed w/ the syringe. Soft/liquid, non-salty diet for next week. If not better, or worse/change, please let us know - we may need to get a specialist involved!     If you have lab work done today you will be contacted with your lab results within the next 2 weeks.  If you have not heard from [place] then please contact us. The fastest way to get your results is to register for My Chart.   IF you received an x-ray today, you will receive an invoice from [facility]. Please contact [facility] at [phone] with questions or concerns regarding your invoice.   IF you received labwork today, you will receive an invoice from [facility]. Please contact [facility] at [phone] with questions or concerns regarding your invoice.   Our billing staff will not be able to assist you with questions regarding bills from these companies.  You will be contacted with the lab results as soon as they are available. The fastest way to get your results is to activate your My Chart account. Instructions are located on the last page of this paperwork. If you have not heard from [place] regarding the results in 2 weeks, please contact this office.

## 2018-12-04 LAB — HM DIABETES EYE EXAM

## 2018-12-06 ENCOUNTER — Encounter: Payer: Self-pay | Admitting: *Deleted

## 2019-02-19 ENCOUNTER — Other Ambulatory Visit: Payer: Self-pay | Admitting: Pharmacist

## 2019-02-19 MED ORDER — ALIROCUMAB 75 MG/ML ~~LOC~~ SOAJ: 75.0000 mg | SUBCUTANEOUS | 3 refills | Status: DC

## 2019-03-01 ENCOUNTER — Telehealth: Payer: Self-pay | Admitting: Cardiology

## 2019-03-01 ENCOUNTER — Telehealth: Payer: Self-pay

## 2019-03-01 ENCOUNTER — Other Ambulatory Visit: Payer: Self-pay

## 2019-03-01 ENCOUNTER — Ambulatory Visit: Payer: Self-pay | Admitting: *Deleted

## 2019-03-01 NOTE — Telephone Encounter (Signed)
New Message:     Pt wants to know when des she need her next remote check?

## 2019-03-01 NOTE — Telephone Encounter (Signed)
Spoke w/ pt and she agreed to send remote transmission today.

## 2019-03-01 NOTE — Telephone Encounter (Signed)
New Message;      Pt wants you to call her, concerning her Praluent.

## 2019-03-02 NOTE — Telephone Encounter (Signed)
Returned call to pt and pt requested a ned pass enrollment form so I will mail it off

## 2019-03-05 LAB — CUP PACEART REMOTE DEVICE CHECK
Battery Impedance: 918 Ohm
Battery Remaining Longevity: 76 mo
Battery Voltage: 2.78 V
Brady Statistic AP VP Percent: 0 %
Brady Statistic AP VS Percent: 0 %
Brady Statistic AS VP Percent: 0 %
Brady Statistic AS VS Percent: 100 %
Date Time Interrogation Session: 20200523170726
Implantable Lead Implant Date: 20091201
Implantable Lead Implant Date: 20091201
Implantable Lead Location: 753859
Implantable Lead Location: 753860
Implantable Lead Model: 5076
Implantable Lead Model: 5076
Implantable Pulse Generator Implant Date: 20091201
Lead Channel Impedance Value: 401 Ohm
Lead Channel Impedance Value: 531 Ohm
Lead Channel Pacing Threshold Amplitude: 0.5 V
Lead Channel Pacing Threshold Amplitude: 1 V
Lead Channel Pacing Threshold Pulse Width: 0.4 ms
Lead Channel Pacing Threshold Pulse Width: 0.4 ms
Lead Channel Setting Pacing Amplitude: 2 V
Lead Channel Setting Pacing Amplitude: 2.5 V
Lead Channel Setting Pacing Pulse Width: 0.4 ms
Lead Channel Setting Sensing Sensitivity: 4 mV

## 2019-03-06 ENCOUNTER — Ambulatory Visit (INDEPENDENT_AMBULATORY_CARE_PROVIDER_SITE_OTHER): Payer: Self-pay | Admitting: *Deleted

## 2019-03-06 DIAGNOSIS — R55 Syncope and collapse: Secondary | ICD-10-CM

## 2019-03-06 DIAGNOSIS — I495 Sick sinus syndrome: Secondary | ICD-10-CM

## 2019-03-14 NOTE — Progress Notes (Signed)
Remote pacemaker transmission.   

## 2019-04-02 ENCOUNTER — Other Ambulatory Visit: Payer: Self-pay | Admitting: Physician Assistant

## 2019-04-02 DIAGNOSIS — F419 Anxiety disorder, unspecified: Secondary | ICD-10-CM

## 2019-04-02 NOTE — Telephone Encounter (Signed)
Please advise is Dr. Mitchel Honour would like to take over rx

## 2019-04-17 ENCOUNTER — Other Ambulatory Visit: Payer: Self-pay

## 2019-04-17 ENCOUNTER — Encounter: Payer: Self-pay | Admitting: Emergency Medicine

## 2019-04-17 ENCOUNTER — Other Ambulatory Visit: Payer: Self-pay | Admitting: Internal Medicine

## 2019-04-17 ENCOUNTER — Telehealth (INDEPENDENT_AMBULATORY_CARE_PROVIDER_SITE_OTHER): Payer: Medicare Other | Admitting: Emergency Medicine

## 2019-04-17 DIAGNOSIS — S161XXA Strain of muscle, fascia and tendon at neck level, initial encounter: Secondary | ICD-10-CM

## 2019-04-17 DIAGNOSIS — I2510 Atherosclerotic heart disease of native coronary artery without angina pectoris: Secondary | ICD-10-CM

## 2019-04-17 DIAGNOSIS — F419 Anxiety disorder, unspecified: Secondary | ICD-10-CM

## 2019-04-17 DIAGNOSIS — J4540 Moderate persistent asthma, uncomplicated: Secondary | ICD-10-CM

## 2019-04-17 DIAGNOSIS — Z9109 Other allergy status, other than to drugs and biological substances: Secondary | ICD-10-CM

## 2019-04-17 DIAGNOSIS — L500 Allergic urticaria: Secondary | ICD-10-CM

## 2019-04-17 DIAGNOSIS — E1142 Type 2 diabetes mellitus with diabetic polyneuropathy: Secondary | ICD-10-CM

## 2019-04-17 DIAGNOSIS — G43109 Migraine with aura, not intractable, without status migrainosus: Secondary | ICD-10-CM

## 2019-04-17 DIAGNOSIS — G629 Polyneuropathy, unspecified: Secondary | ICD-10-CM

## 2019-04-17 MED ORDER — ALBUTEROL SULFATE HFA 108 (90 BASE) MCG/ACT IN AERS: 1.0000 | INHALATION_SPRAY | Freq: Four times a day (QID) | RESPIRATORY_TRACT | 3 refills | Status: AC | PRN

## 2019-04-17 MED ORDER — IPRATROPIUM BROMIDE 0.06 % NA SOLN: 2.0000 | Freq: Four times a day (QID) | NASAL | 1 refills | Status: DC

## 2019-04-17 MED ORDER — GLIMEPIRIDE 4 MG PO TABS: ORAL_TABLET | ORAL | 1 refills | Status: DC

## 2019-04-17 MED ORDER — CYCLOBENZAPRINE HCL 10 MG PO TABS: ORAL_TABLET | ORAL | 1 refills | Status: DC

## 2019-04-17 MED ORDER — METOPROLOL SUCCINATE ER 50 MG PO TB24: 50.0000 mg | ORAL_TABLET | Freq: Two times a day (BID) | ORAL | 3 refills | Status: DC

## 2019-04-17 MED ORDER — CITALOPRAM HYDROBROMIDE 20 MG PO TABS: 20.0000 mg | ORAL_TABLET | Freq: Every day | ORAL | 3 refills | Status: DC

## 2019-04-17 MED ORDER — METFORMIN HCL 500 MG PO TABS: 500.0000 mg | ORAL_TABLET | Freq: Two times a day (BID) | ORAL | 3 refills | Status: DC

## 2019-04-17 MED ORDER — FLUTICASONE PROPIONATE 50 MCG/ACT NA SUSP: 2.0000 | Freq: Every day | NASAL | 3 refills | Status: DC

## 2019-04-17 MED ORDER — FLOVENT HFA 110 MCG/ACT IN AERO: 2.0000 | INHALATION_SPRAY | Freq: Two times a day (BID) | RESPIRATORY_TRACT | 12 refills | Status: DC

## 2019-04-17 MED ORDER — LIDOCAINE VISCOUS HCL 2 % MT SOLN: 5.0000 mL | OROMUCOSAL | 0 refills | Status: DC | PRN

## 2019-04-17 MED ORDER — TRIAMCINOLONE ACETONIDE 0.1 % EX CREA: 1.0000 "application " | TOPICAL_CREAM | CUTANEOUS | 2 refills | Status: DC | PRN

## 2019-04-17 MED ORDER — TRAMADOL HCL 50 MG PO TABS: 50.0000 mg | ORAL_TABLET | Freq: Every evening | ORAL | 0 refills | Status: DC | PRN

## 2019-04-17 MED ORDER — LISINOPRIL 40 MG PO TABS: 40.0000 mg | ORAL_TABLET | Freq: Every day | ORAL | 2 refills | Status: DC

## 2019-04-17 MED ORDER — ATORVASTATIN CALCIUM 80 MG PO TABS: ORAL_TABLET | ORAL | 2 refills | Status: DC

## 2019-04-17 MED ORDER — GLUCOSE BLOOD VI STRP: ORAL_STRIP | 5 refills | Status: DC

## 2019-04-17 MED ORDER — ONDANSETRON 8 MG PO TBDP: ORAL_TABLET | ORAL | 3 refills | Status: DC

## 2019-04-17 MED ORDER — GABAPENTIN 300 MG PO CAPS: ORAL_CAPSULE | ORAL | 1 refills | Status: DC

## 2019-04-17 NOTE — Progress Notes (Signed)
Telemedicine Encounter- SOAP NOTE Established Patient  This telephone encounter was conducted with the patient's (or proxy's) verbal consent via audio telecommunications: yes/no: Yes Patient was instructed to have this encounter in a suitably private space; and to only have persons present to whom they give permission to participate. In addition, patient identity was confirmed by use of name plus two identifiers (DOB and address).  I discussed the limitations, risks, security and privacy concerns of performing an evaluation and management service by telephone and the availability of in person appointments. I also discussed with the patient that there may be a patient responsible charge related to this service. The patient expressed understanding and agreed to proceed.  I spent a total of TIME; 0 MIN TO 60 MIN: 15 minutes talking with the patient or their proxy.  No chief complaint on file.  Follow-up on chronic medical problems and medication refills Subjective   [name] [name] is a 63 y.o. female established patient. Telephone visit today for follow-up on chronic medical problems.  Patient states "I feel great".  Has no complaints or medical concerns today. Problem list reviewed.  Medication list reviewed with patient. HPI   Patient Active Problem List   Diagnosis Date Noted  . Coronary artery disease involving native coronary artery of native heart without angina pectoris 02/14/2018  . Morbid obesity ([place]) 02/14/2018  . Irritable bowel syndrome 06/28/2017  . S/P CABG x 3 07/22/2016  . Multiple vessel coronary artery disease 07/21/2016  . STEMI involving left circumflex coronary artery ([place]) 07/20/2016  . Asthmatic bronchitis 04/26/2016  . Allergic urticaria 04/26/2016  . Environmental allergies 04/26/2016  . Anxiety 04/26/2016  . Sinoatrial node dysfunction (HCC) 04/26/2016  . Pacemaker Medtronic   . Ventricular tachycardia, nonsustained-during exercise testing 03/07/2013  .  OSA (obstructive sleep apnea) 05/25/2011  . Hx of adenomatous colonic polyps 05/25/2011  . Migraine 05/25/2011  . Diabetes mellitus type 2, uncontrolled, with complications ([place]) 73/22/0254  . GERD (gastroesophageal reflux disease) 05/25/2011  . NAFLD (nonalcoholic fatty liver disease) 05/25/2011  . Hyperlipidemia LDL goal <70 05/25/2011  . Essential hypertension 09/16/2009  . DIASTOLIC HEART FAILURE, CHRONIC 09/16/2009    Past Medical History:  Diagnosis Date  . Allergy   . Anal fissure   . Anxiety   . Arthritis   . Asthma   . Cataract 2010    left  . Colon polyps    diverticulosis  03-2011/2005  . Depression   . Diabetes mellitus 2012   T2DM  . Diverticulosis   . Fatty liver 04/2011  . GERD (gastroesophageal reflux disease)   . Hyperlipidemia   . Hypertension severe   . Migraine   . Obesity   . OSA (obstructive sleep apnea)    C-Pap  . Pacemaker Medtronic   . Sinoatrial node dysfunction (HCC)    syncope    Current Outpatient Medications  Medication Sig Dispense Refill  . albuterol (VENTOLIN HFA) 108 (90 Base) MCG/ACT inhaler Inhale 1-2 puffs into the lungs every 6 (six) hours as needed for wheezing or shortness of breath. 6.7 g 3  . Alirocumab (PRALUENT) 75 MG/ML SOAJ Inject 75 mg into the skin every 14 (fourteen) days. 6 pen 3  . aspirin EC 81 MG tablet Take 81 mg by mouth daily.    Marland Kitchen atorvastatin (LIPITOR) 80 MG tablet TAKE ONE TABLET BY MOUTH ONCE DAILY AT 6 PM 90 tablet 2  . cetirizine (ZYRTEC) 10 MG tablet Take 10 mg by mouth daily.      Marland Kitchen  citalopram (CELEXA) 20 MG tablet Take 1 tablet (20 mg total) by mouth daily. 90 tablet 3  . cyclobenzaprine (FLEXERIL) 10 MG tablet TAKE 1 TABLET BY MOUTH AT BEDTIME AS NEEDED AND MAY REPEAT DOSE ONE TIME IF NEEDED FOR MUSCLE SPASMS, CAREFUL OF SEDATION 30 tablet 1  . fluticasone (FLONASE) 50 MCG/ACT nasal spray Place 2 sprays into both nostrils daily. 48 g 3  . fluticasone (FLOVENT HFA) 110 MCG/ACT inhaler Inhale 2 puffs into  the lungs 2 (two) times daily. 1 Inhaler 12  . gabapentin (NEURONTIN) 300 MG capsule TAKE 2 CAPSULES BY MOUTH IN THE MORNING AND 3 CAPS AT BEDTIME 360 capsule 1  . glimepiride (AMARYL) 4 MG tablet TAKE 1/2 (ONE-HALF) TABLET BY MOUTH TWICE DAILY 180 tablet 1  . lidocaine (XYLOCAINE) 2 % solution Use as directed 5-10 mLs in the mouth or throat every 3 (three) hours as needed. 100 mL 0  . metFORMIN (GLUCOPHAGE) 500 MG tablet Take 1 tablet (500 mg total) by mouth 2 (two) times daily with a meal. 180 tablet 3  . metoprolol succinate (TOPROL-XL) 50 MG 24 hr tablet Take 1 tablet (50 mg total) by mouth 2 (two) times daily. Take with or immediately following a meal. 180 tablet 3  . Multiple Vitamin (MULTIVITAMIN) tablet Take 1 tablet by mouth daily.    Marland Kitchen omeprazole (PRILOSEC) 20 MG capsule Take 1 capsule (20 mg total) by mouth daily. 90 capsule 1  . ondansetron (ZOFRAN-ODT) 8 MG disintegrating tablet DISSOLVE 1 ON TONGUE EVERY 12 HOURS AS NEEDED FOR NAUSEA 30 tablet 3  . traMADol (ULTRAM) 50 MG tablet Take 1 tablet (50 mg total) by mouth at bedtime as needed. 30 tablet 0  . triamcinolone cream (KENALOG) 0.1 % Apply 1 application topically as needed. 30 g 2  . fish oil-omega-3 fatty acids 1000 MG capsule Take 3 g by mouth 2 (two) times daily.     Marland Kitchen glucose blood test strip Use as instructed 200 each 5  . ipratropium (ATROVENT) 0.06 % nasal spray Place 2 sprays into both nostrils 4 (four) times daily. 15 mL 1  . lisinopril (ZESTRIL) 40 MG tablet Take 1 tablet (40 mg total) by mouth daily. 90 tablet 2   No current facility-administered medications for this visit.     Allergies  Allergen Reactions  . Adhesive [Tape] Other (See Comments)    SKIN BLISTERS  . Penicillins     Swelling   . Red Yeast Rice Hives  . Sulfa Drugs Cross Reactors Hives    Social History   Socioeconomic History  . Marital status: Single    Spouse name: Not on file  . Number of children: 0  . Years of education: Not on file   . Highest education level: Not on file  Occupational History  . 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 Financial resource strain: Not on file  . Food insecurity    Worry: Not on file    Inability: Not on file  . Transportation needs    Medical: Not on file    Non-medical: Not on file  Tobacco Use  . Smoking status: Former Smoker    Types: Cigarettes    Quit date: 1995    Years since quitting: 25.5  . Smokeless tobacco: Never Used  . Tobacco comment: Quit 20 yrs ago  Substance and Sexual Activity  . Alcohol use: No    Alcohol/week: 0.0 standard drinks  . Drug use:  No  . Sexual activity: Yes    Birth control/protection: Post-menopausal, Other-see comments    Comment: Monogamous   Lifestyle  . Physical activity    Days per week: Not on file    Minutes per session: Not on file  . Stress: Not on file  Relationships  . Social Herbalist on phone: Not on file    Gets together: Not on file    Attends religious service: Not on file    Active member of club or organization: Not on file    Attends meetings of clubs or organizations: Not on file    Relationship status: Not on file  . Intimate partner violence    Fear of current or ex partner: Not on file    Emotionally abused: Not on file    Physically abused: Not on file    Forced sexual activity: Not on file  Other Topics Concern  . Not on file  Social History Narrative   Single. Exercise: No. Education: College.    ROS  Objective   Vitals as reported by the patient: Today's Vitals   04/17/19 0825  Weight: 212 lb (96.2 kg)  Height: '5\' 2"'  (1.575 m)  Alert and oriented x3 in no apparent respiratory distress.  Diagnoses and all orders for this visit:  Coronary artery disease involving native coronary artery of native heart without angina pectoris -     atorvastatin (LIPITOR) 80 MG tablet; TAKE ONE TABLET BY MOUTH ONCE DAILY AT 6 PM  Anxiety -     citalopram (CELEXA) 20  MG tablet; Take 1 tablet (20 mg total) by mouth daily.  Strain of neck muscle, initial encounter -     cyclobenzaprine (FLEXERIL) 10 MG tablet; TAKE 1 TABLET BY MOUTH AT BEDTIME AS NEEDED AND MAY REPEAT DOSE ONE TIME IF NEEDED FOR MUSCLE SPASMS, CAREFUL OF SEDATION  Environmental allergies -     fluticasone (FLONASE) 50 MCG/ACT nasal spray; Place 2 sprays into both nostrils daily.  Asthmatic bronchitis, moderate persistent, uncomplicated -     fluticasone (FLOVENT HFA) 110 MCG/ACT inhaler; Inhale 2 puffs into the lungs 2 (two) times daily.  Type 2 diabetes mellitus with diabetic polyneuropathy, without long-term current use of insulin (HCC) -     gabapentin (NEURONTIN) 300 MG capsule; TAKE 2 CAPSULES BY MOUTH IN THE MORNING AND 3 CAPS AT BEDTIME -     glimepiride (AMARYL) 4 MG tablet; TAKE 1/2 (ONE-HALF) TABLET BY MOUTH TWICE DAILY -     metFORMIN (GLUCOPHAGE) 500 MG tablet; Take 1 tablet (500 mg total) by mouth 2 (two) times daily with a meal.  Neuropathy -     gabapentin (NEURONTIN) 300 MG capsule; TAKE 2 CAPSULES BY MOUTH IN THE MORNING AND 3 CAPS AT BEDTIME -     traMADol (ULTRAM) 50 MG tablet; Take 1 tablet (50 mg total) by mouth at bedtime as needed.  Migraine with aura and without status migrainosus, not intractable -     ondansetron (ZOFRAN-ODT) 8 MG disintegrating tablet; DISSOLVE 1 ON TONGUE EVERY 12 HOURS AS NEEDED FOR NAUSEA  Allergic urticaria -     triamcinolone cream (KENALOG) 0.1 %; Apply 1 application topically as needed.  Other orders -     albuterol (VENTOLIN HFA) 108 (90 Base) MCG/ACT inhaler; Inhale 1-2 puffs into the lungs every 6 (six) hours as needed for wheezing or shortness of breath. -     lidocaine (XYLOCAINE) 2 % solution; Use as directed 5-10 mLs in the  mouth or throat every 3 (three) hours as needed. -     metoprolol succinate (TOPROL-XL) 50 MG 24 hr tablet; Take 1 tablet (50 mg total) by mouth 2 (two) times daily. Take with or immediately following a meal.  -     glucose blood test strip; Use as instructed -     ipratropium (ATROVENT) 0.06 % nasal spray; Place 2 sprays into both nostrils 4 (four) times daily. -     lisinopril (ZESTRIL) 40 MG tablet; Take 1 tablet (40 mg total) by mouth daily.   Clinically stable.  No medical concerns identified during this visit.  Continue present medication.  No changes. Continue to follow-up with cardiologist and other specialists. Office visit in 3 to 6 months.  I discussed the assessment and treatment plan with the patient. The patient was provided an opportunity to ask questions and all were answered. The patient agreed with the plan and demonstrated an understanding of the instructions.   The patient was advised to call back or seek an in-person evaluation if the symptoms worsen or if the condition fails to improve as anticipated.  I provided 15 minutes of non-face-to-face time during this encounter.  Horald Pollen, MD  Primary Care at [facility]

## 2019-04-17 NOTE — Progress Notes (Signed)
Called patient to triage for appointment. Patient is following up 6 months on diabetes and refills. Patient states she had an appointment at [facility] and cataract removed from the left eye on 03/17/2019. Patient stated she will need a referral for mammogram and dexa scan.

## 2019-04-17 NOTE — Addendum Note (Signed)
Addended by: Alfredia Ferguson A on: 04/17/2019 09:45 AM   Modules accepted: Orders

## 2019-04-18 ENCOUNTER — Telehealth: Payer: Self-pay | Admitting: Emergency Medicine

## 2019-04-18 NOTE — Telephone Encounter (Signed)
Patient came in and said she is short on the metopral 50mg  if she could get some sent to  [facility] on [address]

## 2019-04-19 NOTE — Progress Notes (Signed)
LVM to schedule appt

## 2019-05-01 ENCOUNTER — Telehealth: Payer: Self-pay | Admitting: Emergency Medicine

## 2019-05-01 NOTE — Telephone Encounter (Signed)
Pt was received a 30 day supply of the medication Albuterol Aer HFA on 04/19/2019. The medication is not on the patients formulary. The one's that are on the patients formulary is Albuterol HFA (Proair), Proair HFA, and Proair Respiclick. Letter placed in the providers box at nurses station.

## 2019-05-08 ENCOUNTER — Telehealth: Payer: Self-pay | Admitting: *Deleted

## 2019-05-08 LAB — HM COLONOSCOPY

## 2019-05-08 NOTE — Telephone Encounter (Signed)
Pt has been approved for Praluent 04/19/2019-10/26/19.  Authorization # [id number]

## 2019-05-10 ENCOUNTER — Encounter: Payer: Self-pay | Admitting: *Deleted

## 2019-05-11 ENCOUNTER — Telehealth: Payer: Self-pay

## 2019-05-11 MED ORDER — PRALUENT 75 MG/ML ~~LOC~~ SOAJ: 75.0000 mg | SUBCUTANEOUS | 11 refills | Status: DC

## 2019-05-11 NOTE — Addendum Note (Signed)
Addended by: Sulamita Lafountain E on: 05/11/2019 08:04 AM   Modules accepted: Orders

## 2019-05-11 NOTE — Telephone Encounter (Signed)
lmomed the pt stating that the extension for the praluent was granted and that if they wanted to send in the pass application to call us and confirm. I went ahead and sent in the pass app anyway today and awaiting decision.

## 2019-05-11 NOTE — Telephone Encounter (Signed)
Copay is $47/month at her pharmacy if she chooses to fill there or PASS application is denied.

## 2019-05-14 ENCOUNTER — Telehealth: Payer: Self-pay | Admitting: Internal Medicine

## 2019-05-14 ENCOUNTER — Telehealth: Payer: Self-pay | Admitting: Pharmacist

## 2019-05-14 NOTE — Telephone Encounter (Signed)
Patient made aware she has been approved through PASS and she will need to contact them to set up shipment

## 2019-05-14 NOTE — Telephone Encounter (Signed)
New Message    Patient returning your call please call patient back.

## 2019-05-14 NOTE — Telephone Encounter (Signed)
Patient is approved through PASS program as of 05/11/2019. Left VM for patient to return call. She will need to contact PASS program for first shipment

## 2019-05-24 ENCOUNTER — Ambulatory Visit (INDEPENDENT_AMBULATORY_CARE_PROVIDER_SITE_OTHER): Payer: Medicare Other | Admitting: Internal Medicine

## 2019-05-24 ENCOUNTER — Encounter: Payer: Self-pay | Admitting: Internal Medicine

## 2019-05-24 ENCOUNTER — Other Ambulatory Visit: Payer: Self-pay

## 2019-05-24 VITALS — BP 152/80 | HR 61 | Ht 62.0 in | Wt 221.0 lb

## 2019-05-24 DIAGNOSIS — I2510 Atherosclerotic heart disease of native coronary artery without angina pectoris: Secondary | ICD-10-CM | POA: Diagnosis not present

## 2019-05-24 DIAGNOSIS — I10 Essential (primary) hypertension: Secondary | ICD-10-CM

## 2019-05-24 DIAGNOSIS — I495 Sick sinus syndrome: Secondary | ICD-10-CM

## 2019-05-24 DIAGNOSIS — E785 Hyperlipidemia, unspecified: Secondary | ICD-10-CM

## 2019-05-24 DIAGNOSIS — I5032 Chronic diastolic (congestive) heart failure: Secondary | ICD-10-CM

## 2019-05-24 MED ORDER — HYDROCHLOROTHIAZIDE 25 MG PO TABS: 25.0000 mg | ORAL_TABLET | Freq: Every day | ORAL | 1 refills | Status: DC

## 2019-05-24 NOTE — Patient Instructions (Signed)
Medication Instructions:  - Your physician has recommended you make the following change in your medication:   1) START hydrochlorothiazide (HCTZ) 25 mg- take 1 tablet by mouth once daily  If you need a refill on your cardiac medications before your next appointment, please call your pharmacy.   Lab work: - Your physician recommends that you have lab work today: [employer]  - Your physician recommends that you return for lab work in: 1 month- BMP - Please to to the [facility], 1st desk on the right to check in sometime around 9/10-9/15 for labs - No appointment is needed: lab open Monday-Friday 7:30 am-5:30 pm  If you have labs (blood work) drawn today and your tests are completely normal, you will receive your results only by: Marland Kitchen MyChart Message (if you have MyChart) OR . A paper copy in the mail If you have any lab test that is abnormal or we need to change your treatment, we will call you to review the results.  Testing/Procedures: - none ordered  Follow-Up: At [facility], you and your health needs are our priority.  As part of our continuing mission to provide you with exceptional heart care, we have created designated Provider Care Teams.  These Care Teams include your primary Cardiologist (physician) and Advanced Practice Providers (APPs -  Physician Assistants and Nurse Practitioners) who all work together to provide you with the care you need, when you need it. . in 3 months with Dr. Saunders Revel  Any Other Special Instructions Will Be Listed Below (If Applicable). - N/A

## 2019-05-24 NOTE — Progress Notes (Signed)
Follow-up Outpatient Visit Date: 05/24/2019  Primary Care Provider: Horald Pollen, MD [place] [address]  Chief Complaint: Shortness of breath.  HPI:  Ms. Breed is a 63 y.o. year-old female with history of coronary artery disease status post CABG (07/2016) with postoperative atrial fibrillation, hypertension, hyperlipidemia,HFpEF,sinus node dysfunction status post permanent pacemaker, and sleep apnea on CPAP, who presents for follow-up of coronary artery disease.  I last saw her in 08/2018, which time she was doing well, though she noted that her blood pressure tended to run mildly elevated.  We therefore agreed to increase lisinopril to 40 mg daily.  Today, Ms. Tweedy reports feeling relatively well.  She experiences occasional episodes of shortness of breath, typically lasting a few minutes at a time.  They are often with exertion, though they can also come on at rest, including at night when she rolls over.  At other times, she is able to perform strenuous activities without any symptoms.  She denies wheezing and coughing.  Dyspnea improves with her as needed albuterol inhaler.  She denies chest pain, palpitations, and edema.  She has gained some weight over the last 3 to 6 months due to dietary indiscretion.  Home blood pressures typically run 135-140/80 mmHg.  --------------------------------------------------------------------------------------------------  Cardiovascular History & Procedures: Cardiovascular Problems:  Coronary artery disease status post CABG (07/2016)  HFpEF  Sinus node dysfunction status post permanent pacemaker  Risk Factors:  Known coronary artery disease, hypertension, hyperlipidemia, and obesity  Cath/PCI:  LHC (07/20/16): The LMCA normal. LAD with 90% proximal, 80% mid, and 70% distal stenoses as well as 90% stenosis of the ostium of a large septal branch. LCx with 100% ostial/proximal occlusion with right to left collaterals.  PL branch with 100% occlusion with filling via left-to-right collaterals. Mid anterior hypokinesis with LVEF of 50-55%.  CV Surgery:  CABG (07/25/16): LIMA to LAD, SVG to diagonal, and SVG to OM).  EP Procedures and Devices:  Dual-chamber pacemaker (09/10/08, Dr. Caryl Comes): Medtronic  Non-Invasive Evaluation(s):  TTE (07/21/16): Normal LV size and wall thickness. LVEF 60-65% with normal wall motion. Grade 1 diastolic dysfunction. Mild left atrial enlargement. Normal RV size and function.  Exercise MPI (01/30/13): Low risk study without ischemia. Poor exercise capacity. Wide-complex tachycardia during stress (question rate related LBBB). LVEF 73%.  Recent CV Pertinent Labs: Lab Results  Component Value Date   CHOL 136 09/11/2018   HDL 53 09/11/2018   LDLCALC 59 09/11/2018   LDLDIRECT 195 (H) 04/18/2013   TRIG 118 09/11/2018   CHOLHDL 2.6 09/11/2018   CHOLHDL 6.5 07/21/2016   INR 1.33 07/22/2016   BNP 88.8 07/20/2016   BNP 196.3 (H) 06/27/2014   K 4.3 09/11/2018   MG 2.0 07/23/2016   BUN 11 09/11/2018   CREATININE 0.85 09/11/2018   CREATININE 0.92 07/20/2016    Past medical and surgical history were reviewed and updated in EPIC.  Current Meds  Medication Sig  . albuterol (VENTOLIN HFA) 108 (90 Base) MCG/ACT inhaler Inhale 1-2 puffs into the lungs every 6 (six) hours as needed for wheezing or shortness of breath.  . Alirocumab (PRALUENT) 75 MG/ML SOAJ Inject 75 mg into the skin every 14 (fourteen) days.  Marland Kitchen aspirin EC 81 MG tablet Take 81 mg by mouth daily.  Marland Kitchen atorvastatin (LIPITOR) 80 MG tablet TAKE ONE TABLET BY MOUTH ONCE DAILY AT 6 PM  . cetirizine (ZYRTEC) 10 MG tablet Take 10 mg by mouth daily.    . citalopram (CELEXA) 20 MG tablet  Take 1 tablet (20 mg total) by mouth daily.  . cyclobenzaprine (FLEXERIL) 10 MG tablet TAKE 1 TABLET BY MOUTH AT BEDTIME AS NEEDED AND MAY REPEAT DOSE ONE TIME IF NEEDED FOR MUSCLE SPASMS, CAREFUL OF SEDATION  . fluticasone (FLONASE)  50 MCG/ACT nasal spray Place 2 sprays into both nostrils daily.  . fluticasone (FLOVENT HFA) 110 MCG/ACT inhaler Inhale 2 puffs into the lungs 2 (two) times daily.  Marland Kitchen gabapentin (NEURONTIN) 300 MG capsule TAKE 2 CAPSULES BY MOUTH IN THE MORNING AND 3 CAPS AT BEDTIME  . glimepiride (AMARYL) 4 MG tablet TAKE 1/2 (ONE-HALF) TABLET BY MOUTH TWICE DAILY  . glucose blood test strip Use as instructed  . ipratropium (ATROVENT) 0.06 % nasal spray Place 2 sprays into both nostrils 4 (four) times daily.  Marland Kitchen lidocaine (XYLOCAINE) 2 % solution Use as directed 5-10 mLs in the mouth or throat every 3 (three) hours as needed.  Marland Kitchen lisinopril (ZESTRIL) 40 MG tablet Take 1 tablet (40 mg total) by mouth daily.  . metFORMIN (GLUCOPHAGE) 500 MG tablet Take 1 tablet (500 mg total) by mouth 2 (two) times daily with a meal.  . metoprolol succinate (TOPROL-XL) 50 MG 24 hr tablet Take 1 tablet (50 mg total) by mouth 2 (two) times daily. Take with or immediately following a meal.  . Multiple Vitamin (MULTIVITAMIN) tablet Take 1 tablet by mouth daily.  Marland Kitchen omeprazole (PRILOSEC) 20 MG capsule Take 1 capsule (20 mg total) by mouth daily.  . ondansetron (ZOFRAN-ODT) 8 MG disintegrating tablet DISSOLVE 1 ON TONGUE EVERY 12 HOURS AS NEEDED FOR NAUSEA  . traMADol (ULTRAM) 50 MG tablet Take 1 tablet (50 mg total) by mouth at bedtime as needed.  . triamcinolone cream (KENALOG) 0.1 % Apply 1 application topically as needed.    Allergies: Adhesive [tape], Penicillins, Red yeast rice, and Sulfa drugs cross reactors  Social History   Tobacco Use  . Smoking status: Former Smoker    Types: Cigarettes    Quit date: 1995    Years since quitting: 25.6  . Smokeless tobacco: Never Used  . Tobacco comment: Quit 20 yrs ago  Substance Use Topics  . Alcohol use: No    Alcohol/week: 0.0 standard drinks  . Drug use: No    Family History  Problem Relation Age of Onset  . Heart disease Mother   . Cancer Mother 50       lung  . Breast  cancer Mother        maternal grandmother  . Diabetes Sister        type 2  . Breast cancer Brother   . Cancer Maternal Grandmother        breast  . Leukemia Paternal Grandmother        neoplast  . Aneurysm Paternal Grandfather        abdominal (stomach)  . Heart disease Father   . Colon cancer Neg Hx     Review of Systems: A 12-system review of systems was performed and was negative except as noted in the HPI.  --------------------------------------------------------------------------------------------------  Physical Exam: BP (!) 152/80 (BP Location: Left Arm, Patient Position: Sitting, Cuff Size: Large)   Pulse 61   Ht 5\' 2"  (1.575 m)   Wt 221 lb (100.2 kg)   BMI 40.42 kg/m   General: NAD. HEENT: No conjunctival pallor or scleral icterus.  Facemask in place. Neck: Supple without lymphadenopathy, thyromegaly, JVD, or HJR. Lungs: Normal work of breathing. Clear to auscultation bilaterally without wheezes or crackles.  Heart: Regular rate and rhythm without murmurs, rubs, or gallops.  Unable to assess PMI due to body habitus. Abd: Bowel sounds present.  Unable to assess HSM due to body habitus. Ext: Trace ankle edema bilaterally. Skin: Warm and dry without rash.  EKG: Normal sinus rhythm without abnormality.  Lab Results  Component Value Date   WBC WILL FOLLOW 09/15/2016   HGB WILL FOLLOW 09/15/2016   HCT WILL FOLLOW 09/15/2016   MCV WILL FOLLOW 09/15/2016   PLT WILL FOLLOW 09/15/2016    Lab Results  Component Value Date   NA 142 09/11/2018   K 4.3 09/11/2018   CL 97 09/11/2018   CO2 26 09/11/2018   BUN 11 09/11/2018   CREATININE 0.85 09/11/2018   GLUCOSE 136 (H) 09/11/2018   ALT 14 09/11/2018    Lab Results  Component Value Date   CHOL 136 09/11/2018   HDL 53 09/11/2018   LDLCALC 59 09/11/2018   LDLDIRECT 195 (H) 04/18/2013   TRIG 118 09/11/2018   CHOLHDL 2.6 09/11/2018     --------------------------------------------------------------------------------------------------  ASSESSMENT AND PLAN: Coronary artery disease without angina: Ms. Dondra Prader is doing well following her CABG.  We will continue current medications for secondary prevention.  Chronic HFpEF: Dyspnea is likely multifactorial but could be manifestation of HFpEF.  She has trace pedal edema on exam today with associated weight gain of up to 20 pounds over the last 3 to 6 months.  Also this may be dietary indiscretion, subtle fluid retention is also possibility.  Given suboptimal blood pressure control, I have recommended that we add HCTZ 25 mg daily.  I will check a BMP today and again in 1 month.  Hyperlipidemia: LDL well controlled.  Continue Praluent.  Hypertension: As above, blood pressure suboptimally controlled (goal < 130/80).  We will add HCTZ 25 mg daily with BMP today and again in 1 month.  No other medication changes today.  Morbid obesity: BMI > 40 with multiple comorbities.  Encouraged weight loss through diet and exercise.  Sick sinus sydrome: No symptoms of bradycardia.  NSR noted on EKG today.  Continue f/u with the device clinic.  Follow-up: Return to clinic in 3 months.  Nelva Bush, MD 05/24/2019 11:26 AM

## 2019-05-25 ENCOUNTER — Encounter: Payer: Self-pay | Admitting: Internal Medicine

## 2019-05-25 DIAGNOSIS — I5032 Chronic diastolic (congestive) heart failure: Secondary | ICD-10-CM | POA: Insufficient documentation

## 2019-05-25 LAB — BASIC METABOLIC PANEL
BUN/Creatinine Ratio: 27 (ref 12–28)
BUN: 18 mg/dL (ref 8–27)
CO2: 19 mmol/L — ABNORMAL LOW (ref 20–29)
Calcium: 9.3 mg/dL (ref 8.7–10.3)
Chloride: 105 mmol/L (ref 96–106)
Creatinine, Ser: 0.66 mg/dL (ref 0.57–1.00)
GFR calc Af Amer: 107 mL/min/{1.73_m2} (ref >59)
GFR calc non Af Amer: 93 mL/min/{1.73_m2} (ref >59)
Glucose: 90 mg/dL (ref 65–99)
Potassium: 4.8 mmol/L (ref 3.5–5.2)
Sodium: 142 mmol/L (ref 134–144)

## 2019-06-06 ENCOUNTER — Other Ambulatory Visit: Payer: Self-pay | Admitting: Emergency Medicine

## 2019-06-06 ENCOUNTER — Ambulatory Visit (INDEPENDENT_AMBULATORY_CARE_PROVIDER_SITE_OTHER): Payer: Medicare Other | Admitting: *Deleted

## 2019-06-06 DIAGNOSIS — I495 Sick sinus syndrome: Secondary | ICD-10-CM | POA: Diagnosis not present

## 2019-06-06 DIAGNOSIS — G629 Polyneuropathy, unspecified: Secondary | ICD-10-CM

## 2019-06-06 DIAGNOSIS — E1142 Type 2 diabetes mellitus with diabetic polyneuropathy: Secondary | ICD-10-CM

## 2019-06-10 LAB — CUP PACEART REMOTE DEVICE CHECK
Battery Impedance: 944 Ohm
Battery Remaining Longevity: 75 mo
Battery Voltage: 2.77 V
Brady Statistic AP VP Percent: 0 %
Brady Statistic AP VS Percent: 0 %
Brady Statistic AS VP Percent: 0 %
Brady Statistic AS VS Percent: 100 %
Date Time Interrogation Session: 20200829221734
Implantable Lead Implant Date: 20091201
Implantable Lead Implant Date: 20091201
Implantable Lead Location: 753859
Implantable Lead Location: 753860
Implantable Lead Model: 5076
Implantable Lead Model: 5076
Implantable Pulse Generator Implant Date: 20091201
Lead Channel Impedance Value: 406 Ohm
Lead Channel Impedance Value: 580 Ohm
Lead Channel Pacing Threshold Amplitude: 0.5 V
Lead Channel Pacing Threshold Amplitude: 0.75 V
Lead Channel Pacing Threshold Pulse Width: 0.4 ms
Lead Channel Pacing Threshold Pulse Width: 0.4 ms
Lead Channel Setting Pacing Amplitude: 2 V
Lead Channel Setting Pacing Amplitude: 2.5 V
Lead Channel Setting Pacing Pulse Width: 0.4 ms
Lead Channel Setting Sensing Sensitivity: 5.6 mV

## 2019-06-14 ENCOUNTER — Encounter: Payer: Self-pay | Admitting: Cardiology

## 2019-06-14 NOTE — Progress Notes (Signed)
Remote pacemaker transmission.   

## 2019-06-21 ENCOUNTER — Encounter: Payer: Self-pay | Admitting: Emergency Medicine

## 2019-06-26 ENCOUNTER — Encounter: Payer: Self-pay | Admitting: *Deleted

## 2019-07-17 NOTE — Progress Notes (Deleted)
Cardiology Office Note Date:  07/17/2019  Patient ID:  [name] [name], [name] [name] [birth year], MRN [id number] PCP:  Horald Pollen, MD  Cardiologist:  Dr. Caryl Comes    Chief Complaint:  Device visit  History of Present Illness: [name] [name] is a 63 y.o. female with history of sinus node dysfunction w/PPM, CAD s/p CABG Oct 12,2017, post-op AFib tx with amiodarone briefly, no a/c, OSA w/CPAP and reported compliance, HTN, HLD, HFpEF.  The patient was observed to have elevated BP readings at peak exercise during cardiac rehab, quickly recovering with rest.  She was seen by myself in Feb to f/u on this.  Her lisinopril was up-titrated.  She was feeling very well, no CP, palpitations or SOB, no dizziness, near syncope or syncope. At that visit her amiodarone noted to have been inadvertantly resumed and was discontinued.   I saw her again 06/2017 she was doing very well.  Denied any kind of CP, palpitations, no dizziness, near syncope or syncope.  She has asthma and says the heat can bother her otherwise no SOB.  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  She reported her labs are routinely monitored by her PMD,  including her lipids.  She has been on 325mg  ASA since her bypass.  She was referred to Dr. Saunders Revel for cardiology care and has had regular f/u with him.    She saw Dr. Caryl Comes 06/2018, no changes were made to her medicines or device programming.  *** symptoms *** meds *** volume status, HFpEF, Dr. Saunders Revel started HCTZ in August, ? Needs f/u BMEt, not done, planned for 1,mo *** any AF?   Device information: MDT dual chamber PPM, implanted 09/10/08, Dr. Lovena Le, sick sinus syndrome (follows w/Dr. Caryl Comes)   Past Medical History:  Diagnosis Date  . Allergy   . Anal fissure   . Anxiety   . Arthritis   . Asthma   . Cataract 2010    left  . Colon polyps    diverticulosis  03-2011/2005  . Depression   . Diabetes mellitus 2012   T2DM  . Diverticulosis   . Fatty liver 04/2011  . GERD (gastroesophageal reflux disease)   . Hyperlipidemia   . Hypertension severe   . Migraine   . Obesity   . OSA (obstructive sleep apnea)    C-Pap  . Pacemaker Medtronic   . Sinoatrial node dysfunction (HCC)    syncope    Past Surgical History:  Procedure Laterality Date  . ABDOMINAL HYSTERECTOMY  2000   total  . CARDIAC CATHETERIZATION N/A 07/20/2016   Procedure: Left Heart Cath and Coronary Angiography;  Surgeon: Peter M Martinique, MD;  Location: [facility];  Service: Cardiovascular;  Laterality: N/A;  . CATARACT EXTRACTION Left   . CORONARY ARTERY BYPASS GRAFT N/A 07/22/2016   Procedure: CORONARY ARTERY BYPASS GRAFTING (CABG) x three , using left internal mammary artery and right leg greater saphenous vein harvested endoscopically;  Surgeon: Ivin Poot, MD;  Location: [facility];  Service: Open Heart Surgery;  Laterality: N/A;  . EYE SURGERY Right 2010  . OOPHORECTOMY    . PACEMAKER PLACEMENT    . T/A right knee    . TEE WITHOUT CARDIOVERSION N/A 07/22/2016   Procedure: TRANSESOPHAGEAL ECHOCARDIOGRAM (TEE);  Surgeon: Ivin Poot, MD;  Location: [facility];  Service: Open Heart Surgery;  Laterality: N/A;  . TONSILLECTOMY      Current Outpatient  Medications  Medication Sig Dispense Refill  . albuterol (VENTOLIN HFA) 108 (90 Base) MCG/ACT inhaler Inhale 1-2 puffs into the lungs every 6 (six) hours as needed for wheezing or shortness of breath. 6.7 g 3  . Alirocumab (PRALUENT) 75 MG/ML SOAJ Inject 75 mg into the skin every 14 (fourteen) days. 2 pen 11  . aspirin EC 81 MG tablet Take 81 mg by mouth daily.    Marland Kitchen atorvastatin (LIPITOR) 80 MG tablet TAKE ONE TABLET BY MOUTH ONCE DAILY AT 6 PM 90 tablet 2  . cetirizine (ZYRTEC) 10 MG tablet Take 10 mg by mouth daily.      . citalopram (CELEXA) 20 MG tablet Take 1 tablet (20 mg total) by mouth daily. 90 tablet 3  . cyclobenzaprine (FLEXERIL) 10 MG tablet TAKE 1 TABLET BY MOUTH  AT BEDTIME AS NEEDED AND MAY REPEAT DOSE ONE TIME IF NEEDED FOR MUSCLE SPASMS, CAREFUL OF SEDATION 30 tablet 1  . fluticasone (FLONASE) 50 MCG/ACT nasal spray Place 2 sprays into both nostrils daily. 48 g 3  . fluticasone (FLOVENT HFA) 110 MCG/ACT inhaler Inhale 2 puffs into the lungs 2 (two) times daily. 1 Inhaler 12  . gabapentin (NEURONTIN) 300 MG capsule TAKE 2 CAPSULES BY MOUTH IN THE MORNING AND 3 CAPSULES  AT BEDTIME 450 capsule 0  . glimepiride (AMARYL) 4 MG tablet TAKE 1/2 (ONE-HALF) TABLET BY MOUTH TWICE DAILY 180 tablet 1  . glucose blood test strip Use as instructed 200 each 5  . hydrochlorothiazide (HYDRODIURIL) 25 MG tablet Take 1 tablet (25 mg total) by mouth daily. 30 tablet 1  . ipratropium (ATROVENT) 0.06 % nasal spray Place 2 sprays into both nostrils 4 (four) times daily. 15 mL 1  . lidocaine (XYLOCAINE) 2 % solution Use as directed 5-10 mLs in the mouth or throat every 3 (three) hours as needed. 100 mL 0  . lisinopril (ZESTRIL) 40 MG tablet Take 1 tablet (40 mg total) by mouth daily. 90 tablet 2  . metFORMIN (GLUCOPHAGE) 500 MG tablet Take 1 tablet (500 mg total) by mouth 2 (two) times daily with a meal. 180 tablet 3  . metoprolol succinate (TOPROL-XL) 50 MG 24 hr tablet Take 1 tablet (50 mg total) by mouth 2 (two) times daily. Take with or immediately following a meal. 180 tablet 3  . Multiple Vitamin (MULTIVITAMIN) tablet Take 1 tablet by mouth daily.    Marland Kitchen omeprazole (PRILOSEC) 20 MG capsule Take 1 capsule (20 mg total) by mouth daily. 90 capsule 1  . ondansetron (ZOFRAN-ODT) 8 MG disintegrating tablet DISSOLVE 1 ON TONGUE EVERY 12 HOURS AS NEEDED FOR NAUSEA 30 tablet 3  . traMADol (ULTRAM) 50 MG tablet Take 1 tablet (50 mg total) by mouth at bedtime as needed. 30 tablet 0  . triamcinolone cream (KENALOG) 0.1 % Apply 1 application topically as needed. 30 g 2   No current facility-administered medications for this visit.     Allergies:   Adhesive [tape], Penicillins, Red  yeast rice, and Sulfa drugs cross reactors   Social History:  The patient  reports that she quit smoking about 25 years ago. Her smoking use included cigarettes. She has never used smokeless tobacco. She reports that she does not drink alcohol or use drugs.   Family History:  The patient's family history includes Aneurysm in her paternal grandfather; Breast cancer in her brother and mother; Cancer in her maternal grandmother; Cancer (age of onset: 18) in her mother; Diabetes in her sister; Heart disease in her  father and mother; Leukemia in her paternal grandmother.  ROS:  Please see the history of present illness.  All other systems are reviewed and otherwise negative.   PHYSICAL EXAM:  VS:  There were no vitals taken for this visit. BMI: There is no height or weight on file to calculate BMI. Well nourished, well developed, in no acute distress  HEENT: normocephalic, atraumatic  Neck: no JVD, carotid bruits or masses Cardiac:  RRR; no significant murmurs, no rubs, or gallops Lungs:  CTA b/l, no wheezing, rhonchi or rales  Abd: soft, nontender, obese MS: no deformity or atrophy Ext:  no edema  Skin: warm and dry, no rash Neuro:  No gross deficits appreciated Psych: euthymic mood, full affect  PPM site is stable, no tethering, no erythema, skin changes, tenderness or fluid collection   EKG:  Done today and reviewed by myself is SR, appears unchanged PPM interrogation done today and reviewed by myself:  stable battery and lead measurements, 99.7% AS/VS,, no AF, one rate drop tx   07/21/16: TTE Study Conclusions - Left ventricle: The cavity size was normal. Wall thickness was   normal. Systolic function was normal. The estimated ejection   fraction was in the range of 60% to 65%. Wall motion was normal;   there were no regional wall motion abnormalities. Doppler   parameters are consistent with abnormal left ventricular   relaxation (grade 1 diastolic dysfunction). - Left atrium:  The atrium was mildly dilated. Anterior-posterior   dimension: 44 mm. - Right ventricle: Pacer wire or catheter noted in right ventricle. Impressions: - Compared to the prior study, there has been no significant   interval change.   Recent Labs: 09/11/2018: ALT 14 05/24/2019: BUN 18; Creatinine, Ser 0.66; Potassium 4.8; Sodium 142  09/11/2018: Chol/HDL Ratio 2.6; Cholesterol, Total 136; HDL 53; LDL Calculated 59; Triglycerides 118   CrCl cannot be calculated (Patient's most recent lab result is older than the maximum 21 days allowed.).   Wt Readings from Last 3 Encounters:  05/24/19 221 lb (100.2 kg)  04/17/19 212 lb (96.2 kg)  11/25/18 203 lb 9.6 oz (92.4 kg)     Other studies reviewed: Additional studies/records reviewed today include: summarized above  ASSESSMENT AND PLAN:  1. CAD  S/p NSTEMI, CABG Oct 2017, (left internal mammary artery to LAD, saphenous vein graft to diagonal, saphenous vein graft to obtuse marginal).     *** no CP     On ASA, statin, BB     *** Dr. Saunders Revel   2. HTN    ***  Looks OK  3. Sinus node dysfunction w/PPM      *** Normal device function, no changes  4. HLD      *** On high dose statin     *** Reports monitored with her PMD, discussed her Trigs as above  5. Post op, (CABG) PAF     *** None on her pacer check     CHA2DS2Vasc is 3-4     Never started on a/c     *** No recurrent AF to date   Disposition:  ***   Current medicines are reviewed at length with the patient today.  The patient did not have any concerns regarding medicines.  Haywood Lasso, PA-C 07/17/2019 8:35 AM     [facility] [address] [place] [address] [phone] (office)  [phone] (fax)

## 2019-07-19 ENCOUNTER — Telehealth: Payer: Self-pay

## 2019-07-19 ENCOUNTER — Encounter: Payer: Medicare Other | Admitting: Physician Assistant

## 2019-07-19 MED ORDER — HYDROCHLOROTHIAZIDE 25 MG PO TABS: 25.0000 mg | ORAL_TABLET | Freq: Every day | ORAL | 0 refills | Status: DC

## 2019-07-19 NOTE — Telephone Encounter (Signed)
Requested Prescriptions   Signed Prescriptions Disp Refills  . hydrochlorothiazide (HYDRODIURIL) 25 MG tablet 90 tablet 0    Sig: Take 1 tablet (25 mg total) by mouth daily. NEEDS OFFICE VISIT FOR FURTHER REFILLS.    Authorizing Provider: END, CHRISTOPHER    Ordering User: Raelene Bott, Alexxia Stankiewicz L

## 2019-08-27 NOTE — Progress Notes (Signed)
Follow-up Outpatient Visit Date: 08/29/2019  Primary Care Provider: Horald Pollen, MD [place] [address]  Chief Complaint: Follow-up heart failure and coronary artery disease  HPI:  [name] [name] is a 63 y.o. year-old female with history of coronary artery disease status post CABG (07/2016) with postoperative atrial fibrillation, chronic HFpEF, hypertension, hyperlipidemia,HFpEF,sinus node dysfunction status post permanent pacemaker, and sleep apnea on CPAP, who presents for follow-up of coronary artery disease.  I last saw hier in August, at which time she reported occasional shortness of breath (can be at rest or with exertion).  Interesting, she was able to do strenuous activities at other times without any dyspnea or other limitations.  We agreed to add HCTZ 25 mg daily, as her blood pressure was suboptimally controlled at that time as well.  Today, [name] [name] reports that she is feeling better with less shortness of breath and leg swelling.  She still notes occasional leg edema.  Blood pressure has been well controlled, with systolic readings between 120 and 130 mmHg.  She continues to have sporadic "shooting pains" in her chest that are random and lasts a few seconds at a time.  They seem to be a little more frequent than when we last spoke, occurring a few times a week.  She does not have any exertional chest pain.  She notes that her sister recently passed away from CHF and wonders if stress related to this could be contributing to her symptoms.  Dyspnea on exertion has improved.  [name] [name] notes that her weight has increased further, largely due to dietary indiscretion and decreased activity in the setting of the COVID-19 pandemic.  --------------------------------------------------------------------------------------------------  Cardiovascular History & Procedures: Cardiovascular Problems:  Coronary artery disease status post CABG (07/2016)  HFpEF  Sinus node  dysfunction status post permanent pacemaker  Risk Factors:  Known coronary artery disease, hypertension, hyperlipidemia, and obesity  Cath/PCI:  LHC (07/20/16): The LMCA normal. LAD with 90% proximal, 80% mid, and 70% distal stenoses as well as 90% stenosis of the ostium of a large septal branch. LCx with 100% ostial/proximal occlusion with right to left collaterals. PL branch with 100% occlusion with filling via left-to-right collaterals. Mid anterior hypokinesis with LVEF of 50-55%.  CV Surgery:  CABG (07/25/16): LIMA to LAD, SVG to diagonal, and SVG to OM).  EP Procedures and Devices:  Dual-chamber pacemaker (09/10/08, Dr. Caryl Comes): Medtronic  Non-Invasive Evaluation(s):  TTE (07/21/16): Normal LV size and wall thickness. LVEF 60-65% with normal wall motion. Grade 1 diastolic dysfunction. Mild left atrial enlargement. Normal RV size and function.  Exercise MPI (01/30/13): Low risk study without ischemia. Poor exercise capacity. Wide-complex tachycardia during stress (question rate related LBBB). LVEF 73%.  Recent CV Pertinent Labs: Lab Results  Component Value Date   CHOL 136 09/11/2018   HDL 53 09/11/2018   LDLCALC 59 09/11/2018   LDLDIRECT 195 (H) 04/18/2013   TRIG 118 09/11/2018   CHOLHDL 2.6 09/11/2018   CHOLHDL 6.5 07/21/2016   INR 1.33 07/22/2016   BNP 88.8 07/20/2016   BNP 196.3 (H) 06/27/2014   K 4.2 08/28/2019   MG 2.0 07/23/2016   BUN 17 08/28/2019   BUN 18 05/24/2019   CREATININE 0.92 08/28/2019   CREATININE 0.92 07/20/2016    Past medical and surgical history were reviewed and updated in EPIC.  Current Meds  Medication Sig   albuterol (VENTOLIN HFA) 108 (90 Base) MCG/ACT inhaler Inhale 1-2 puffs into the lungs every 6 (six) hours as needed  for wheezing or shortness of breath.   Alirocumab (PRALUENT) 75 MG/ML SOAJ Inject 75 mg into the skin every 14 (fourteen) days.   atorvastatin (LIPITOR) 80 MG tablet TAKE ONE TABLET BY MOUTH ONCE  DAILY AT 6 PM   cetirizine (ZYRTEC) 10 MG tablet Take 10 mg by mouth daily.     citalopram (CELEXA) 20 MG tablet Take 1 tablet (20 mg total) by mouth daily.   cyclobenzaprine (FLEXERIL) 10 MG tablet TAKE 1 TABLET BY MOUTH AT BEDTIME AS NEEDED AND MAY REPEAT DOSE ONE TIME IF NEEDED FOR MUSCLE SPASMS, CAREFUL OF SEDATION   fluticasone (FLONASE) 50 MCG/ACT nasal spray Place 2 sprays into both nostrils daily.   fluticasone (FLOVENT HFA) 110 MCG/ACT inhaler Inhale 2 puffs into the lungs 2 (two) times daily.   gabapentin (NEURONTIN) 300 MG capsule TAKE 2 CAPSULES BY MOUTH IN THE MORNING AND 3 CAPSULES  AT BEDTIME   glimepiride (AMARYL) 4 MG tablet TAKE 1/2 (ONE-HALF) TABLET BY MOUTH TWICE DAILY   glucose blood test strip Use as instructed   hydrochlorothiazide (HYDRODIURIL) 25 MG tablet Take 1 tablet (25 mg total) by mouth daily. NEEDS OFFICE VISIT FOR FURTHER REFILLS.   lidocaine (XYLOCAINE) 2 % solution Use as directed 5-10 mLs in the mouth or throat every 3 (three) hours as needed.   lisinopril (ZESTRIL) 40 MG tablet Take 1 tablet (40 mg total) by mouth daily.   metFORMIN (GLUCOPHAGE) 500 MG tablet Take 1 tablet (500 mg total) by mouth 2 (two) times daily with a meal.   metoprolol succinate (TOPROL-XL) 50 MG 24 hr tablet Take 1 tablet (50 mg total) by mouth 2 (two) times daily. Take with or immediately following a meal.   Multiple Vitamin (MULTIVITAMIN) tablet Take 1 tablet by mouth daily.   omeprazole (PRILOSEC) 20 MG capsule Take 1 capsule (20 mg total) by mouth daily.   ondansetron (ZOFRAN-ODT) 8 MG disintegrating tablet DISSOLVE 1 ON TONGUE EVERY 12 HOURS AS NEEDED FOR NAUSEA   traMADol (ULTRAM) 50 MG tablet Take 1 tablet (50 mg total) by mouth at bedtime as needed.   triamcinolone cream (KENALOG) 0.1 % Apply 1 application topically as needed.    Allergies: Adhesive [tape], Penicillins, Red yeast rice, and Sulfa drugs cross reactors  Social History   Tobacco Use    Smoking status: Former Smoker    Types: Cigarettes    Quit date: 1995    Years since quitting: 25.9   Smokeless tobacco: Never Used   Tobacco comment: Quit 20 yrs ago  Substance Use Topics   Alcohol use: No    Alcohol/week: 0.0 standard drinks   Drug use: No    Family History  Problem Relation Age of Onset   Heart disease Mother    Cancer Mother 66       lung   Breast cancer Mother        maternal grandmother   Diabetes Sister        type 2   Breast cancer Brother    Cancer Maternal Grandmother        breast   Leukemia Paternal Grandmother        neoplast   Aneurysm Paternal Grandfather        abdominal (stomach)   Heart disease Father    Colon cancer Neg Hx     Review of Systems: A 12-system review of systems was performed and was negative except as noted in the HPI.  --------------------------------------------------------------------------------------------------  Physical Exam: BP 118/72 (BP Location: Left  Arm, Patient Position: Sitting, Cuff Size: Large)    Pulse (!) 58    Ht 5\' 2"  (1.575 m)    Wt 227 lb 12 oz (103.3 kg)    SpO2 98%    BMI 41.66 kg/m   General: NAD. HEENT: No conjunctival pallor or scleral icterus. Moist mucous membranes.  OP clear. Neck: Supple without lymphadenopathy, thyromegaly, JVD, or HJR. Lungs: Normal work of breathing. Clear to auscultation bilaterally without wheezes or crackles. Heart: Bradycardic but regular without murmurs, rubs, or gallops.  Unable to assess PMI due to body habitus. Abd: Bowel sounds present. Soft, NT/ND.  Unable to assess HSM due to body habitus. Ext: Trace ankle edema. Radial, PT, and DP pulses are 2+ bilaterally. Skin: Warm and dry without rash.  EKG: Sinus bradycardia (heart rate 50 bpm) with low voltage.  Otherwise, no significant abnormality.  Lab Results  Component Value Date   WBC WILL FOLLOW 09/15/2016   HGB WILL FOLLOW 09/15/2016   HCT WILL FOLLOW 09/15/2016   MCV WILL FOLLOW  09/15/2016   PLT WILL FOLLOW 09/15/2016    Lab Results  Component Value Date   NA 139 08/28/2019   K 4.2 08/28/2019   CL 103 08/28/2019   CO2 25 08/28/2019   BUN 17 08/28/2019   CREATININE 0.92 08/28/2019   GLUCOSE 175 (H) 08/28/2019   ALT 14 09/11/2018    Lab Results  Component Value Date   CHOL 136 09/11/2018   HDL 53 09/11/2018   LDLCALC 59 09/11/2018   LDLDIRECT 195 (H) 04/18/2013   TRIG 118 09/11/2018   CHOLHDL 2.6 09/11/2018    --------------------------------------------------------------------------------------------------  ASSESSMENT AND PLAN: Coronary artery disease: [name] [name] is doing well following her CABG in 2017.  She continues to have fleeting chest pains that do not sound cardiac in etiology.  We will defer additional work-up and continue her current medications for secondary prevention.  Chronic HFpEF: [name] [name] has mild pedal edema on exam today but overall reports that she is feeling better with addition of HCTZ.  We will plan to continue this and work on lifestyle modifications, including weight loss through diet and exercise.  I will also provide her with a prescription for furosemide 20 mg daily as needed for breakthrough edema.  Hypertension: Blood pressure much better controlled today with addition of HCTZ at our last visit.  Labs yesterday showed normal renal function and electrolytes.  Hyperlipidemia: LDL at goal on last check a year ago.  We will check a lipid panel and LFTs today.  Sinus node dysfunction: Status post pacemaker without any symptoms of bradycardia.  Continue follow-up through the device clinic.  Morbid obesity: BMI greater than 40 with multiple comorbidities.  I have encouraged [name] [name] to lose weight through diet and exercise.  Follow-up: Return to clinic in 3 months.  Nelva Bush, MD 08/30/2019 6:33 AM

## 2019-08-28 ENCOUNTER — Other Ambulatory Visit
Admission: RE | Admit: 2019-08-28 | Discharge: 2019-08-28 | Disposition: A | Discharge status: Home / Self Care | Payer: Medicare Other | Source: Ambulatory Visit | Attending: Internal Medicine | Admitting: Internal Medicine

## 2019-08-28 ENCOUNTER — Other Ambulatory Visit: Payer: Self-pay

## 2019-08-28 DIAGNOSIS — I10 Essential (primary) hypertension: Secondary | ICD-10-CM | POA: Diagnosis not present

## 2019-08-28 DIAGNOSIS — I2510 Atherosclerotic heart disease of native coronary artery without angina pectoris: Secondary | ICD-10-CM | POA: Diagnosis not present

## 2019-08-28 LAB — BASIC METABOLIC PANEL
Anion gap: 11 (ref 5–15)
BUN: 17 mg/dL (ref 8–23)
CO2: 25 mmol/L (ref 22–32)
Calcium: 9.7 mg/dL (ref 8.9–10.3)
Chloride: 103 mmol/L (ref 98–111)
Creatinine, Ser: 0.92 mg/dL (ref 0.44–1.00)
GFR calc Af Amer: >60 mL/min (ref >60)
GFR calc non Af Amer: >60 mL/min (ref >60)
Glucose, Bld: 175 mg/dL — ABNORMAL HIGH (ref 70–99)
Potassium: 4.2 mmol/L (ref 3.5–5.1)
Sodium: 139 mmol/L (ref 135–145)

## 2019-08-29 ENCOUNTER — Encounter: Payer: Self-pay | Admitting: Internal Medicine

## 2019-08-29 ENCOUNTER — Other Ambulatory Visit: Payer: Self-pay | Admitting: Emergency Medicine

## 2019-08-29 ENCOUNTER — Ambulatory Visit (INDEPENDENT_AMBULATORY_CARE_PROVIDER_SITE_OTHER): Payer: Medicare Other | Admitting: Internal Medicine

## 2019-08-29 ENCOUNTER — Encounter: Payer: Self-pay | Admitting: Emergency Medicine

## 2019-08-29 VITALS — BP 118/72 | HR 58 | Ht 62.0 in | Wt 227.8 lb

## 2019-08-29 DIAGNOSIS — R29818 Other symptoms and signs involving the nervous system: Secondary | ICD-10-CM

## 2019-08-29 DIAGNOSIS — E785 Hyperlipidemia, unspecified: Secondary | ICD-10-CM

## 2019-08-29 DIAGNOSIS — I10 Essential (primary) hypertension: Secondary | ICD-10-CM

## 2019-08-29 DIAGNOSIS — I2510 Atherosclerotic heart disease of native coronary artery without angina pectoris: Secondary | ICD-10-CM | POA: Diagnosis not present

## 2019-08-29 DIAGNOSIS — I495 Sick sinus syndrome: Secondary | ICD-10-CM | POA: Diagnosis not present

## 2019-08-29 DIAGNOSIS — I5032 Chronic diastolic (congestive) heart failure: Secondary | ICD-10-CM | POA: Diagnosis not present

## 2019-08-29 DIAGNOSIS — Z1231 Encounter for screening mammogram for malignant neoplasm of breast: Secondary | ICD-10-CM

## 2019-08-29 MED ORDER — FUROSEMIDE 20 MG PO TABS: 20.0000 mg | ORAL_TABLET | Freq: Every day | ORAL | 5 refills | Status: DC | PRN

## 2019-08-29 NOTE — Patient Instructions (Signed)
Medication Instructions:  Your physician has recommended you make the following change in your medication:  1- TAKE Furosemide 20 mg (1 tablet) by mouth as needed for edema/swelling.  *If you need a refill on your cardiac medications before your next appointment, please call your pharmacy*  Lab Work: Your physician recommends that you return for lab work in: TODAY - LIPID, LIVER, DIRECT LDL.   If you have labs (blood work) drawn today and your tests are completely normal, you will receive your results only by: Marland Kitchen MyChart Message (if you have MyChart) OR . A paper copy in the mail If you have any lab test that is abnormal or we need to change your treatment, we will call you to review the results.  Testing/Procedures: none  Follow-Up: At [facility], you and your health needs are our priority.  As part of our continuing mission to provide you with exceptional heart care, we have created designated Provider Care Teams.  These Care Teams include your primary Cardiologist (physician) and Advanced Practice Providers (APPs -  Physician Assistants and Nurse Practitioners) who all work together to provide you with the care you need, when you need it.  Your next appointment:   3 month(s)  The format for your next appointment:   In Person  Provider:    You may see Nelva Bush, MD or one of the following Advanced Practice Providers on your designated Care Team:    Murray Hodgkins, NP  Christell Faith, PA-C  Marrianne Mood, PA-C

## 2019-08-29 NOTE — Telephone Encounter (Signed)
Referred for sleep study and mammogram.  Thanks.

## 2019-08-30 ENCOUNTER — Encounter: Payer: Self-pay | Admitting: Internal Medicine

## 2019-08-30 LAB — LIPID PANEL
Chol/HDL Ratio: 2.7 ratio (ref 0.0–4.4)
Cholesterol, Total: 118 mg/dL (ref 100–199)
HDL: 43 mg/dL (ref >39)
LDL Chol Calc (NIH): 45 mg/dL (ref 0–99)
Triglycerides: 181 mg/dL — ABNORMAL HIGH (ref 0–149)
VLDL Cholesterol Cal: 30 mg/dL (ref 5–40)

## 2019-08-30 LAB — HEPATIC FUNCTION PANEL
ALT: 22 IU/L (ref 0–32)
AST: 22 IU/L (ref 0–40)
Albumin: 4.8 g/dL (ref 3.8–4.8)
Alkaline Phosphatase: 86 IU/L (ref 39–117)
Bilirubin Total: 0.4 mg/dL (ref 0.0–1.2)
Bilirubin, Direct: 0.13 mg/dL (ref 0.00–0.40)
Total Protein: 6.7 g/dL (ref 6.0–8.5)

## 2019-08-30 LAB — LDL CHOLESTEROL, DIRECT: LDL Direct: 54 mg/dL (ref 0–99)

## 2019-09-06 ENCOUNTER — Other Ambulatory Visit: Payer: Self-pay | Admitting: Internal Medicine

## 2019-09-13 ENCOUNTER — Encounter: Payer: Self-pay | Admitting: Neurology

## 2019-09-13 ENCOUNTER — Other Ambulatory Visit: Payer: Self-pay

## 2019-09-13 ENCOUNTER — Ambulatory Visit: Payer: Medicare Other | Admitting: Neurology

## 2019-09-13 VITALS — BP 131/76 | HR 66 | Temp 97.6°F | Ht 62.0 in | Wt 226.0 lb

## 2019-09-13 DIAGNOSIS — G4733 Obstructive sleep apnea (adult) (pediatric): Secondary | ICD-10-CM

## 2019-09-13 DIAGNOSIS — I480 Paroxysmal atrial fibrillation: Secondary | ICD-10-CM

## 2019-09-13 DIAGNOSIS — Z6841 Body Mass Index (BMI) 40.0 and over, adult: Secondary | ICD-10-CM

## 2019-09-13 DIAGNOSIS — Z950 Presence of cardiac pacemaker: Secondary | ICD-10-CM

## 2019-09-13 DIAGNOSIS — G2581 Restless legs syndrome: Secondary | ICD-10-CM

## 2019-09-13 DIAGNOSIS — Z9989 Dependence on other enabling machines and devices: Secondary | ICD-10-CM

## 2019-09-13 DIAGNOSIS — Z951 Presence of aortocoronary bypass graft: Secondary | ICD-10-CM

## 2019-09-13 NOTE — Patient Instructions (Signed)
Thank you for choosing [facility] for your sleep related care! It was nice to meet you today! I appreciate that you entrust me with your sleep related healthcare concerns. I hope, I was able to address at least some of your concerns today, and that I can help you feel reassured and also get better.    Here is what we discussed today and what we came up with as our plan for you:  I think it is very honorable of you to donate your Late sisters medical equipment.  I spoke with Shirlean Mylar in the sleep lab and she would be delighted to accept your sisters donated BiPAP machine.  Bring it at any point at your convenience, probably when you come for your sleep study.    Based on your symptoms and your exam I believe you are still at risk for obstructive sleep apnea and would benefit from reevaluation as it has been many years and you need new supplies and updated machine. Therefore, I think we should proceed with a sleep study to determine how severe your sleep apnea is. If you have more than mild OSA, I want you to consider ongoing treatment with CPAP. Please remember, the risks and ramifications of moderate to severe obstructive sleep apnea or OSA are: Cardiovascular disease, including congestive heart failure, stroke, difficult to control hypertension, arrhythmias, and even type 2 diabetes has been linked to untreated OSA. Sleep apnea causes disruption of sleep and sleep deprivation in most cases, which, in turn, can cause recurrent headaches, problems with memory, mood, concentration, focus, and vigilance. Most people with untreated sleep apnea report excessive daytime sleepiness, which can affect their ability to drive. Please do not drive if you feel sleepy.   I will likely see you back after your sleep study to go over the test results and where to go from there. We will call you after your sleep study to advise about the results (most likely, you will hear from [place], my nurse) and to set  up an appointment at the time, as necessary.    Our sleep lab administrative assistant will call you to schedule your sleep study. If you don't hear back from her by about 2 weeks from now, please feel free to call her at [phone]. You can leave a message with your phone number and concerns, if you get the voicemail box. She will call back as soon as possible.

## 2019-09-13 NOTE — Progress Notes (Signed)
Subjective:    Patient ID: [name] [name] is a 63 y.o. female.  HPI     Star Age, MD, PhD [facility] [address], [address] [place], [place] [address]  Dear Dr. Mitchel Honour, I saw your patient, Ailin Rochford, upon your kind request in my sleep clinic today for initial consultation of her sleep disorder, in particular, reevaluation of her prior diagnosis of OSA.  The patient is unaccompanied today.  As you know, Ms. Zolman is a 63 year old right-handed woman with an underlying complex medical history of hypertension, coronary artery disease, status post CABG (2017), history of postoperative A. 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  She has an older CPAP machine.  A CPAP compliance download was not possible.  Prior sleep study results are not available for my review today.  Her Epworth sleepiness score is 6 out of 24.  She reports being consistent with her CPAP.  She bought an auto Pap about 2 years ago online, her settings are 12 to 20 cm per her report.  She had significant sleep disruption before her sleep apnea diagnosis including significant nocturia and severe restless leg symptoms.  She still has some difficulty maintaining sleep and sometimes going to sleep.  She lived with her sister who was sadly passed away recently, sister had significant cardiac disease and also sleep apnea on a BiPAP machine.  Patient would like to donate her late sisters BiPAP machine.  Patient's weight has been fluctuating over time, compared to her maximum weight she had lost about 50 pounds at one point but since the pandemic she has gained some weight back, probably about 30 pounds lighter than her max weight at  this point.  She goes to bed around 9, rise time is around 7 currently as she is helping her sister's grandchildren with home schooling.  She goes to their home.  Patient is retired.  She would be open to getting reevaluated for her sleep apnea and hopefully get new equipment.  She has been using a small nasal mask.  She is single, has no children of her own, she has 1 outside feral cat.  Her Past Medical History Is Significant For: Past Medical History:  Diagnosis Date  . Allergy   . Anal fissure   . Anxiety   . Arthritis   . Asthma   . Cataract 2010    left  . Colon polyps    diverticulosis  03-2011/2005  . Depression   . Diabetes mellitus 2012   T2DM  . Diverticulosis   . Fatty liver 04/2011  . GERD (gastroesophageal reflux disease)   . Hyperlipidemia   . Hypertension severe   . Migraine   . Obesity   . OSA (obstructive sleep apnea)    C-Pap  . Pacemaker Medtronic   . Sinoatrial node dysfunction (HCC)    syncope    Her Past Surgical History Is Significant For: Past Surgical History:  Procedure Laterality Date  . ABDOMINAL HYSTERECTOMY  2000   total  . CARDIAC CATHETERIZATION N/A 07/20/2016   Procedure: Left Heart Cath and Coronary Angiography;  Surgeon: Peter M Martinique, MD;  Location: [facility];  Service: Cardiovascular;  Laterality: N/A;  . CATARACT EXTRACTION Left   . CORONARY ARTERY BYPASS GRAFT  N/A 07/22/2016   Procedure: CORONARY ARTERY BYPASS GRAFTING (CABG) x three , using left internal mammary artery and right leg greater saphenous vein harvested endoscopically;  Surgeon: Ivin Poot, MD;  Location: [place];  Service: Open Heart Surgery;  Laterality: N/A;  . EYE SURGERY Right 2010  . OOPHORECTOMY    . PACEMAKER PLACEMENT    . T/A right knee    . TEE WITHOUT CARDIOVERSION N/A 07/22/2016   Procedure: TRANSESOPHAGEAL ECHOCARDIOGRAM (TEE);  Surgeon: Ivin Poot, MD;  Location: [place];  Service: Open Heart Surgery;  Laterality: N/A;  . TONSILLECTOMY       Her Family History Is Significant For: Family History  Problem Relation Age of Onset  . Heart disease Mother   . Cancer Mother 28       lung  . Breast cancer Mother        maternal grandmother  . Diabetes Sister        type 2  . Breast cancer Brother   . Cancer Maternal Grandmother        breast  . Leukemia Paternal Grandmother        neoplast  . Aneurysm Paternal Grandfather        abdominal (stomach)  . Heart disease Father   . Colon cancer Neg Hx     Her Social History Is Significant For: Social History   Socioeconomic History  . Marital status: Single    Spouse name: Not on file  . Number of children: 0  . Years of education: Not on file  . Highest education level: Not on file  Occupational History  . Occupation: Surveyor, quantity: Quarry manager [employer]  . Financial resource strain: Not on file  . Food insecurity    Worry: Not on file    Inability: Not on file  . Transportation needs    Medical: Not on file    Non-medical: Not on file  Tobacco Use  . Smoking status: Former Smoker    Types: Cigarettes    Quit date: 1995    Years since quitting: 25.9  . Smokeless tobacco: Never Used  . Tobacco comment: Quit 20 yrs ago  Substance and Sexual Activity  . Alcohol use: No    Alcohol/week: 0.0 standard drinks  . Drug use: No  . Sexual activity: Yes    Birth control/protection: Post-menopausal, Other-see comments    Comment: Monogamous   Lifestyle  . Physical activity    Days per week: Not on file    Minutes per session: Not on file  . Stress: Not on file  Relationships  . Social Herbalist on phone: Not on file    Gets together: Not on file    Attends religious service: Not on file    Active member of club or organization: Not on file    Attends meetings of clubs or organizations: Not on file    Relationship status: Not on file  Other Topics Concern  . Not on file  Social History Narrative   Single. Exercise: No.  Education: College.    Her Allergies Are:  Allergies  Allergen Reactions  . Adhesive [Tape] Other (See Comments)    SKIN BLISTERS  . Penicillins     Swelling   . Red Yeast Rice Hives  . Sulfa Drugs Cross Reactors Hives  :   Her Current Medications Are:  Outpatient Encounter Medications as of 09/13/2019  Medication Sig  .  albuterol (VENTOLIN HFA) 108 (90 Base) MCG/ACT inhaler Inhale 1-2 puffs into the lungs every 6 (six) hours as needed for wheezing or shortness of breath.  . Alirocumab (PRALUENT) 75 MG/ML SOAJ Inject 75 mg into the skin every 14 (fourteen) days.  Marland Kitchen atorvastatin (LIPITOR) 80 MG tablet TAKE ONE TABLET BY MOUTH ONCE DAILY AT 6 PM  . cetirizine (ZYRTEC) 10 MG tablet Take 10 mg by mouth daily.    . cyclobenzaprine (FLEXERIL) 10 MG tablet TAKE 1 TABLET BY MOUTH AT BEDTIME AS NEEDED AND MAY REPEAT DOSE ONE TIME IF NEEDED FOR MUSCLE SPASMS, CAREFUL OF SEDATION  . fluticasone (FLONASE) 50 MCG/ACT nasal spray Place 2 sprays into both nostrils daily.  . fluticasone (FLOVENT HFA) 110 MCG/ACT inhaler Inhale 2 puffs into the lungs 2 (two) times daily.  . furosemide (LASIX) 20 MG tablet Take 1 tablet (20 mg total) by mouth daily as needed for edema (swelling).  . gabapentin (NEURONTIN) 300 MG capsule TAKE 2 CAPSULES BY MOUTH IN THE MORNING AND 3 CAPSULES  AT BEDTIME  . glimepiride (AMARYL) 4 MG tablet TAKE 1/2 (ONE-HALF) TABLET BY MOUTH TWICE DAILY  . glucose blood test strip Use as instructed  . hydrochlorothiazide (HYDRODIURIL) 25 MG tablet Take 1 tablet (25 mg total) by mouth daily.  Marland Kitchen lidocaine (XYLOCAINE) 2 % solution Use as directed 5-10 mLs in the mouth or throat every 3 (three) hours as needed.  . Multiple Vitamin (MULTIVITAMIN) tablet Take 1 tablet by mouth daily.  Marland Kitchen omeprazole (PRILOSEC) 20 MG capsule Take 1 capsule (20 mg total) by mouth daily.  . ondansetron (ZOFRAN-ODT) 8 MG disintegrating tablet DISSOLVE 1 ON TONGUE EVERY 12 HOURS AS NEEDED FOR NAUSEA  . traMADol  (ULTRAM) 50 MG tablet Take 1 tablet (50 mg total) by mouth at bedtime as needed.  . triamcinolone cream (KENALOG) 0.1 % Apply 1 application topically as needed.  . citalopram (CELEXA) 20 MG tablet Take 1 tablet (20 mg total) by mouth daily.  Marland Kitchen lisinopril (ZESTRIL) 40 MG tablet Take 1 tablet (40 mg total) by mouth daily.  . metFORMIN (GLUCOPHAGE) 500 MG tablet Take 1 tablet (500 mg total) by mouth 2 (two) times daily with a meal.  . metoprolol succinate (TOPROL-XL) 50 MG 24 hr tablet Take 1 tablet (50 mg total) by mouth 2 (two) times daily. Take with or immediately following a meal.   No facility-administered encounter medications on file as of 09/13/2019.   :  Review of Systems:  Out of a complete 14 point review of systems, all are reviewed and negative with the exception of these symptoms as listed below: Review of Systems  Neurological:       Rm 2, alone. Using cpap (http://cohen-armstrong.com/). Had sleep study in [place]. (did not get copy of report).  Has machine chip but not compatible with our data reader.  Machine is [employer].    Objective:  Neurological Exam  Physical Exam Physical Examination:   Vitals:   09/13/19 0850  BP: 131/76  Pulse: 66  Temp: 97.6 F (36.4 C)    General Examination: The patient is a very pleasant 63 y.o. female in no acute distress. She appears well-developed and well-nourished and well groomed.   HEENT: Normocephalic, atraumatic, pupils are equal, round and reactive to light, extraocular tracking is good without limitation to gaze excursion or nystagmus noted. Hearing is grossly intact. Face is symmetric with normal facial animation. Speech is clear with no dysarthria noted. There is no hypophonia. There is no lip, neck/head,  jaw or voice tremor. Neck is supple with full range of passive and active motion. There are no carotid bruits on auscultation. Oropharynx exam reveals: moderate mouth dryness, adequate to marginal dental hygiene and moderate airway crowding, due  to Rather small airway entry, very small uvula noted, tonsils are absent, larger tongue noted.  Mallampati is class II, neck circumference is 16-1/2 inches.  Tongue protrudes centrally in palate elevates symmetrically.  Chest: Clear to auscultation without wheezing, rhonchi or crackles noted.  Heart: S1+S2+0, regular and normal without murmurs, rubs or gallops noted.   Abdomen: Soft, non-tender and non-distended with normal bowel sounds appreciated on auscultation.  Extremities: There is trace pitting edema in the distal lower extremities bilaterally.   Skin: Warm and dry without trophic changes noted.   Musculoskeletal: exam reveals no obvious joint deformities, tenderness or joint swelling or erythema.   Neurologically:  Mental status: The patient is awake, alert and oriented in all 4 spheres. Her immediate and remote memory, attention, language skills and fund of knowledge are appropriate. There is no evidence of aphasia, agnosia, apraxia or anomia. Speech is clear with normal prosody and enunciation. Thought process is linear. Mood is normal and affect is normal.  Cranial nerves II - XII are as described above under HEENT exam.  Motor exam: Normal bulk, strength and tone is noted. There is no tremor, Romberg is negative. Reflexes are 2+ throughout. Fine motor skills and coordination: grossly intact.  Cerebellar testing: No dysmetria or intention tremor. There is no truncal or gait ataxia.  Sensory exam: intact to light touch in the upper and lower extremities.  Gait, station and balance: She stands easily. No veering to one side is noted. No leaning to one side is noted. Posture is age-appropriate and stance is narrow based. Gait shows normal stride length and normal pace. No problems turning are noted.                 Assessment and Plan:   In summary, Journie Howson is a very pleasant 63 y.o.-year old female with an underlying complex medical history of hypertension, coronary artery  disease, status post CABG (2017), history of postoperative A. 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  She has been on CPAP therapy, currently on an AutoPap machine as I understand.  She would benefit from reevaluation as she has not had sleep testing in several years and she may be eligible for new equipment. I had a long chat with the patient about my findings and the diagnosis of OSA, its prognosis and treatment options. We talked about medical treatments, surgical interventions and non-pharmacological approaches. I explained in particular the risks and ramifications of untreated moderate to severe OSA, especially with respect to developing cardiovascular disease down the Road, including congestive heart failure, difficult to treat hypertension, cardiac arrhythmias, or stroke. Even type 2 diabetes has, in part, been linked to untreated OSA. Symptoms of untreated OSA include daytime sleepiness, memory problems, mood irritability and mood disorder such as depression and anxiety, lack of energy, as well as recurrent headaches, especially morning headaches. We talked about trying to maintain a healthy lifestyle in general, as well as the importance of weight control. We also talked about the importance of good sleep hygiene. I  recommended the following at this time: sleep study. I explained the sleep test procedure to the patient and also outlined possible surgical and non-surgical treatment options of OSA. She has adapted well to positive airway pressure treatment and would be willing to continue with the CPAP, AutoPap or even BiPAP if needed. We will proceed with sleep study testing. I will  follow up afterwards. For now, she is encouraged to continue to use her current machine.  I answered all her questions today and she was in agreement.  Thank you very much for allowing me to participate in the care of this nice patient. If I can be of any further assistance to you please do not hesitate to call me at [phone].  Sincerely,   Star Age, MD, PhD

## 2019-09-14 ENCOUNTER — Ambulatory Visit (INDEPENDENT_AMBULATORY_CARE_PROVIDER_SITE_OTHER): Payer: Medicare Other | Admitting: *Deleted

## 2019-09-14 DIAGNOSIS — I495 Sick sinus syndrome: Secondary | ICD-10-CM

## 2019-09-14 LAB — CUP PACEART REMOTE DEVICE CHECK
Battery Impedance: 1048 Ohm
Battery Remaining Longevity: 71 mo
Battery Voltage: 2.77 V
Brady Statistic AP VP Percent: 0 %
Brady Statistic AP VS Percent: 0 %
Brady Statistic AS VP Percent: 0 %
Brady Statistic AS VS Percent: 100 %
Date Time Interrogation Session: 20201203171301
Implantable Lead Implant Date: 20091201
Implantable Lead Implant Date: 20091201
Implantable Lead Location: 753859
Implantable Lead Location: 753860
Implantable Lead Model: 5076
Implantable Lead Model: 5076
Implantable Pulse Generator Implant Date: 20091201
Lead Channel Impedance Value: 406 Ohm
Lead Channel Impedance Value: 573 Ohm
Lead Channel Pacing Threshold Amplitude: 0.5 V
Lead Channel Pacing Threshold Amplitude: 0.875 V
Lead Channel Pacing Threshold Pulse Width: 0.4 ms
Lead Channel Pacing Threshold Pulse Width: 0.4 ms
Lead Channel Setting Pacing Amplitude: 2 V
Lead Channel Setting Pacing Amplitude: 2.5 V
Lead Channel Setting Pacing Pulse Width: 0.4 ms
Lead Channel Setting Sensing Sensitivity: 4 mV

## 2019-09-27 ENCOUNTER — Ambulatory Visit (INDEPENDENT_AMBULATORY_CARE_PROVIDER_SITE_OTHER): Payer: Medicare Other | Admitting: Neurology

## 2019-09-27 ENCOUNTER — Other Ambulatory Visit: Payer: Self-pay

## 2019-09-27 DIAGNOSIS — G4761 Periodic limb movement disorder: Secondary | ICD-10-CM

## 2019-09-27 DIAGNOSIS — Z9989 Dependence on other enabling machines and devices: Secondary | ICD-10-CM

## 2019-09-27 DIAGNOSIS — I480 Paroxysmal atrial fibrillation: Secondary | ICD-10-CM

## 2019-09-27 DIAGNOSIS — G2581 Restless legs syndrome: Secondary | ICD-10-CM

## 2019-09-27 DIAGNOSIS — G4733 Obstructive sleep apnea (adult) (pediatric): Secondary | ICD-10-CM | POA: Diagnosis not present

## 2019-09-27 DIAGNOSIS — Z951 Presence of aortocoronary bypass graft: Secondary | ICD-10-CM

## 2019-09-27 DIAGNOSIS — Z950 Presence of cardiac pacemaker: Secondary | ICD-10-CM

## 2019-09-27 DIAGNOSIS — G4720 Circadian rhythm sleep disorder, unspecified type: Secondary | ICD-10-CM

## 2019-09-27 DIAGNOSIS — Z6841 Body Mass Index (BMI) 40.0 and over, adult: Secondary | ICD-10-CM

## 2019-10-10 NOTE — Procedures (Signed)
PATIENT'S NAMEMayella, [name] DOB:      1956/04/24      MR#:    [id number]     DATE OF RECORDING: 09/27/2019 REFERRING M.D.:  Agustina Caroli, MD Study Performed:   Baseline Polysomnogram HISTORY: 63 year old woman with a history of hypertension, coronary artery disease, status post CABG (2017), history of postoperative A. 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  She has an older CPAP machine. The patient endorsed the Epworth Sleepiness Scale at 6 points. The patient's weight 226 pounds with a height of 62 (inches), resulting in a BMI of 41.8 kg/m2. The patient's neck circumference measured 16.5 inches.  CURRENT MEDICATIONS: Ventolin HFA, Praluent, Lipitor, Zyrtec, Flexeril, Flonase, Flovent, Lasix, Neurontin, Amaryl, Hydrodiuril, Xylocaine, Multivitamin, Prilosec, Zofran-ODT, Ultram, Kenalog, Celexa, Zestril, Glucophage, Toprol-XL   PROCEDURE:  This is a multichannel digital polysomnogram utilizing the Somnostar 11.2 system.  Electrodes and sensors were applied and monitored per AASM Specifications.   EEG, EOG, Chin and Limb EMG, were sampled at 200 Hz.  ECG, Snore and Nasal Pressure, Thermal Airflow, Respiratory Effort, CPAP Flow and Pressure, Oximetry was sampled at 50 Hz. Digital video and audio were recorded.      BASELINE STUDY  Lights Out was at 21:40 and Lights On at 04:36.  Total recording time (TRT) was 417 minutes, with a total sleep time (TST) of 394.5 minutes.   The patient's sleep latency was 22 minutes.  REM latency was 289.5 minutes, which is markedly delayed. The sleep efficiency was 94.6%.     SLEEP ARCHITECTURE: WASO (Wake after sleep onset) was 18 minutes with mild sleep fragmentation noted.  There were 13.5  minutes in Stage N1, 174 minutes Stage N2, 186.5 minutes Stage N3 and 20.5 minutes in Stage REM.  The percentage of Stage N1 was 3.4%, Stage N2 was 44.1%, Stage N3 was 47.3%, which is markedly increased, and Stage R (REM sleep) was 5.2%, which is markedly reduced. The arousals were noted as: 79 were spontaneous, 54 were associated with PLMs, 30 were associated with respiratory events.  RESPIRATORY ANALYSIS:  There were a total of 46 respiratory events:  19 obstructive apneas, 0 central apneas and 0 mixed apneas with a total of 19 apneas and an apnea index (AI) of 2.9 /hour. There were 27 hypopneas with a hypopnea index of 4.1 /hour. The patient also had 0 respiratory event related arousals (RERAs).      The total APNEA/HYPOPNEA INDEX (AHI) was 7./hour and the total RESPIRATORY DISTURBANCE INDEX was  7. /hour.  0 events occurred in REM sleep and 54 events in NREM. The REM AHI was  0 /hour, versus a non-REM AHI of 7.4. The patient spent 17.5 minutes of total sleep time in the supine position and 377 minutes in non-supine.. The supine AHI was 0.0 versus a non-supine AHI of 7.3.  OXYGEN SATURATION & C02:  The Wake baseline 02 saturation was 94%, with the lowest being 85%. Time spent below 89% saturation equaled 11 minutes.  PERIODIC LIMB MOVEMENTS: The patient had a total of 192 Periodic Limb Movements.  The Periodic Limb Movement (PLM) index was 29.2 and the PLM Arousal index was 8.2/hour.  Audio and video analysis did not show any abnormal or unusual movements, behaviors, phonations or vocalizations. The patient took no bathroom  breaks. Mild snoring was noted. The EKG was in keeping with normal sinus rhythm (NSR).  Post-study, the patient indicated that sleep was the same as usual.   IMPRESSION:  1. Obstructive Sleep Apnea (OSA) 2. Periodic Limb Movement Disorder (PLMD) 3. Dysfunctions associated with sleep stages or arousal from sleep  RECOMMENDATIONS:  1. This study demonstrates overall mild  obstructive sleep apnea, with a total AHI of 7/hour, and O2 nadir of 85%. The absence of supine REM sleep and reduced percentage of REM sleep may underestimate her AHI and O2 nadir. Given the patient's medical history and prior, long-standing history of CPAP therapy, treatment with positive airway pressure is recommended; this can be achieved in the form of autoPAP. Alternatively, a full-night CPAP titration study would allow optimization of therapy if needed. Other treatment options may include avoidance of supine sleep position along with weight loss, upper airway or jaw surgery in selected patients or the use of an oral appliance in certain patients. ENT evaluation and/or consultation with a maxillofacial surgeon or dentist may be feasible in some instances.    2. Please note that untreated obstructive sleep apnea may carry additional perioperative morbidity. Patients with significant obstructive sleep apnea should receive perioperative PAP therapy and the surgeons and particularly the anesthesiologist should be informed of the diagnosis and the severity of the sleep disordered breathing. 3. This study shows sleep fragmentation and abnormal sleep stage percentages; these are nonspecific findings and per se do not signify an intrinsic sleep disorder or a cause for the patient's sleep-related symptoms. Causes include (but are not limited to) the first night effect of the sleep study, circadian rhythm disturbances, medication effect or an underlying mood disorder or medical problem.  4. Moderate PLMs (periodic limb movements of sleep) were noted during this study with minimal arousals; clinical correlation is recommended. Medication effect from the antidepressant medication should be considered.  5. The patient should be cautioned not to drive, work at heights, or operate dangerous or heavy equipment when tired or sleepy. Review and reiteration of good sleep hygiene measures should be pursued with any  patient. 6. The patient will be seen in follow-up by Dr. Rexene Alberts at [facility] for discussion of the test results and further management strategies. The referring provider will be notified of the test results.  I certify that I have reviewed the entire raw data recording prior to the issuance of this report in accordance with the Standards of Accreditation of the American Academy of Sleep Medicine (AASM)   Star Age, MD, PhD Diplomat, American Board of Neurology and Sleep Medicine (Neurology and Sleep Medicine)

## 2019-10-10 NOTE — Addendum Note (Signed)
Addended by: Star Age on: 10/10/2019 06:07 PM   Modules accepted: Orders

## 2019-10-10 NOTE — Progress Notes (Signed)
Patient referred by Dr. Mitchel Honour, seen by me on 09/13/19, diagnostic PSG on 09/27/19.   Please call and notify the patient that the recent sleep study did confirm the diagnosis of obstructive sleep apnea. OSA is overall mild, but worth treating due to her medical Hx and due to the fact that she has been on CPAP for years. She would qualify for new equipment and I recommend treatment in the form of autoPAP, so that we don't have to bring her back for a second sleep study with CPAP, but will let her try an autoPAP machine at home, through a DME company (of her choice, or as per insurance requirement). The DME representative will educate her on how to use the machine, how to put the mask on, etc. I have placed an order in the chart. Please send referral, talk to patient, send report to referring MD. We will need a FU in sleep clinic for 10 weeks post-PAP set up, please arrange that with me or one of our NPs. Thanks,   Star Age, MD, PhD [facility])

## 2019-10-10 NOTE — Progress Notes (Signed)
PPM remote 

## 2019-10-11 ENCOUNTER — Telehealth: Payer: Self-pay | Admitting: Neurology

## 2019-10-11 ENCOUNTER — Encounter: Payer: Self-pay | Admitting: Neurology

## 2019-10-11 NOTE — Telephone Encounter (Signed)
-----   Message from Star Age, MD sent at 10/10/2019  6:07 PM EST ----- Patient referred by Dr. Mitchel Honour, seen by me on 09/13/19, diagnostic PSG on 09/27/19.   Please call and notify the patient that the recent sleep study did confirm the diagnosis of obstructive sleep apnea. OSA is overall mild, but worth treating due to her medical Hx and due to the fact that she has been on CPAP for years. She would qualify for new equipment and I recommend treatment in the form of autoPAP, so that we don't have to bring her back for a second sleep study with CPAP, but will let her try an autoPAP machine at home, through a DME company (of her choice, or as per insurance requirement). The DME representative will educate her on how to use the machine, how to put the mask on, etc. I have placed an order in the chart. Please send referral, talk to patient, send report to referring MD. We will need a FU in sleep clinic for 10 weeks post-PAP set up, please arrange that with me or one of our NPs. Thanks,   Star Age, MD, PhD [facility])

## 2019-10-11 NOTE — Telephone Encounter (Signed)
Called patient to discuss sleep study results. No answer at this time. LVM for the patient to call back.   

## 2019-10-24 ENCOUNTER — Encounter: Payer: Self-pay | Admitting: Emergency Medicine

## 2019-10-29 ENCOUNTER — Ambulatory Visit (INDEPENDENT_AMBULATORY_CARE_PROVIDER_SITE_OTHER): Payer: Medicare Other | Admitting: Emergency Medicine

## 2019-10-29 ENCOUNTER — Other Ambulatory Visit: Payer: Self-pay

## 2019-10-29 ENCOUNTER — Telehealth: Payer: Self-pay | Admitting: *Deleted

## 2019-10-29 ENCOUNTER — Encounter: Payer: Self-pay | Admitting: Emergency Medicine

## 2019-10-29 VITALS — BP 129/87 | HR 73 | Temp 98.3°F | Resp 16 | Ht 62.0 in | Wt 228.0 lb

## 2019-10-29 DIAGNOSIS — Z23 Encounter for immunization: Secondary | ICD-10-CM

## 2019-10-29 DIAGNOSIS — R21 Rash and other nonspecific skin eruption: Secondary | ICD-10-CM

## 2019-10-29 DIAGNOSIS — E1159 Type 2 diabetes mellitus with other circulatory complications: Secondary | ICD-10-CM

## 2019-10-29 DIAGNOSIS — G629 Polyneuropathy, unspecified: Secondary | ICD-10-CM | POA: Diagnosis not present

## 2019-10-29 DIAGNOSIS — I10 Essential (primary) hypertension: Secondary | ICD-10-CM

## 2019-10-29 DIAGNOSIS — E1142 Type 2 diabetes mellitus with diabetic polyneuropathy: Secondary | ICD-10-CM | POA: Diagnosis not present

## 2019-10-29 DIAGNOSIS — I2510 Atherosclerotic heart disease of native coronary artery without angina pectoris: Secondary | ICD-10-CM

## 2019-10-29 DIAGNOSIS — Z6841 Body Mass Index (BMI) 40.0 and over, adult: Secondary | ICD-10-CM

## 2019-10-29 DIAGNOSIS — M6530 Trigger finger, unspecified finger: Secondary | ICD-10-CM

## 2019-10-29 DIAGNOSIS — I5032 Chronic diastolic (congestive) heart failure: Secondary | ICD-10-CM

## 2019-10-29 DIAGNOSIS — I152 Hypertension secondary to endocrine disorders: Secondary | ICD-10-CM

## 2019-10-29 DIAGNOSIS — G4733 Obstructive sleep apnea (adult) (pediatric): Secondary | ICD-10-CM

## 2019-10-29 DIAGNOSIS — E785 Hyperlipidemia, unspecified: Secondary | ICD-10-CM

## 2019-10-29 MED ORDER — GLUCOSE BLOOD VI STRP: ORAL_STRIP | 5 refills | Status: DC

## 2019-10-29 MED ORDER — TRAMADOL HCL 50 MG PO TABS: 50.0000 mg | ORAL_TABLET | Freq: Every evening | ORAL | 0 refills | Status: DC | PRN

## 2019-10-29 NOTE — Telephone Encounter (Signed)
Faxed Rx to [facility] for glucose blood test strips. Confirmation page 1:55 pm.

## 2019-10-29 NOTE — Progress Notes (Signed)
[name] [name] 64 y.o.   Chief Complaint  Patient presents with  . Hand Pain    RIGHT 3rd finger per pt since 09/30/2019 it locks  . Medication Refill    glucose test strips and tramadol    HISTORY OF PRESENT ILLNESS: This is a 64 y.o. female with history of diabetes here for follow-up medication refill. Also complaining of trigger finger to right middle finger that started around 09/30/2019.  Denies injury. 1.  Diabetes: On glimepiride 4 mg daily.  Blood glucose at home 100s.  Not on Metformin due to renal concerns. 2.  History of coronary artery disease status post bypass surgery and congestive heart failure.  Has a pacemaker.  Sees cardiologist regularly.  Doing well.  No complaints. 3.  Hypertension 4.  Dyslipidemia 5.  Obstructive sleep apnea on CPAP machine 6.  Diabetic neuropathy 7.  History of congestive heart failure with preserved ejection fraction.  Recently started on Lasix.  HPI   Prior to Admission medications   Medication Sig Start Date End Date Taking? Authorizing Provider  albuterol (VENTOLIN HFA) 108 (90 Base) MCG/ACT inhaler Inhale 1-2 puffs into the lungs every 6 (six) hours as needed for wheezing or shortness of breath. 04/17/19  Yes Domingo Fuson, Ines Bloomer, MD  Alirocumab (PRALUENT) 75 MG/ML SOAJ Inject 75 mg into the skin every 14 (fourteen) days. 05/11/19  Yes End, Harrell Gave, MD  atorvastatin (LIPITOR) 80 MG tablet TAKE ONE TABLET BY MOUTH ONCE DAILY AT 6 PM 04/17/19  Yes Areana Kosanke, Ines Bloomer, MD  cetirizine (ZYRTEC) 10 MG tablet Take 10 mg by mouth daily.     Yes [provider]  cyclobenzaprine (FLEXERIL) 10 MG tablet TAKE 1 TABLET BY MOUTH AT BEDTIME AS NEEDED AND MAY REPEAT DOSE ONE TIME IF NEEDED FOR MUSCLE SPASMS, CAREFUL OF SEDATION 04/17/19  Yes Young Mulvey, Ines Bloomer, MD  fluticasone [facility]) 50 MCG/ACT nasal spray Place 2 sprays into both nostrils daily. 04/17/19  Yes Dewaine Morocho, Ines Bloomer, MD  fluticasone (FLOVENT HFA) 110 MCG/ACT inhaler Inhale 2  puffs into the lungs 2 (two) times daily. 04/17/19  Yes Juline Sanderford, Ines Bloomer, MD  furosemide (LASIX) 20 MG tablet Take 1 tablet (20 mg total) by mouth daily as needed for edema (swelling). 08/29/19 11/27/19 Yes End, Harrell Gave, MD  gabapentin (NEURONTIN) 300 MG capsule TAKE 2 CAPSULES BY MOUTH IN THE MORNING AND 3 CAPSULES  AT BEDTIME 06/06/19  Yes Martino Tompson, Ines Bloomer, MD  glimepiride (AMARYL) 4 MG tablet TAKE 1/2 (ONE-HALF) TABLET BY MOUTH TWICE DAILY 04/17/19  Yes Cambryn Charters, Ines Bloomer, MD  hydrochlorothiazide (HYDRODIURIL) 25 MG tablet Take 1 tablet (25 mg total) by mouth daily. 09/10/19  Yes End, Harrell Gave, MD  lidocaine (XYLOCAINE) 2 % solution Use as directed 5-10 mLs in the mouth or throat every 3 (three) hours as needed. 04/17/19  Yes Tahni Porchia, Ines Bloomer, MD  Multiple Vitamin (MULTIVITAMIN) tablet Take 1 tablet by mouth daily. 08/30/16  Yes Lars Pinks M, PA-C  nystatin cream (MYCOSTATIN) Apply 1 application topically as needed for dry skin.   Yes [provider]  omeprazole (PRILOSEC) 20 MG capsule Take 1 capsule (20 mg total) by mouth daily. 12/15/16  Yes English, Colletta [place] D, PA  ondansetron (ZOFRAN-ODT) 8 MG disintegrating tablet DISSOLVE 1 ON TONGUE EVERY 12 HOURS AS NEEDED FOR NAUSEA 04/17/19  Yes Sparkle Aube, Ines Bloomer, MD  traMADol (ULTRAM) 50 MG tablet Take 1 tablet (50 mg total) by mouth at bedtime as needed. 04/17/19  Yes Hymie Gorr, Ines Bloomer, MD  triamcinolone cream (KENALOG)  0.1 % Apply 1 application topically as needed. 04/17/19  Yes Jaquelinne Glendening, Ines Bloomer, MD  citalopram (CELEXA) 20 MG tablet Take 1 tablet (20 mg total) by mouth daily. 04/17/19 08/29/19  Horald Pollen, MD  glucose blood test strip Use as instructed 04/17/19   Horald Pollen, MD  lisinopril (ZESTRIL) 40 MG tablet Take 1 tablet (40 mg total) by mouth daily. 04/17/19 08/29/19  Horald Pollen, MD  metFORMIN (GLUCOPHAGE) 500 MG tablet Take 1 tablet (500 mg total) by mouth 2 (two) times daily  with a meal. 04/17/19 08/29/19  Antwuan Eckley, Ines Bloomer, MD  metoprolol succinate (TOPROL-XL) 50 MG 24 hr tablet Take 1 tablet (50 mg total) by mouth 2 (two) times daily. Take with or immediately following a meal. 04/17/19 08/29/19  Horald Pollen, MD    Allergies  Allergen Reactions  . Adhesive [Tape] Other (See Comments)    SKIN BLISTERS  . Penicillins     Swelling   . Red Yeast Rice Hives  . Sulfa Drugs Cross Reactors Hives    Patient Active Problem List   Diagnosis Date Noted  . Chronic heart failure with preserved ejection fraction (HFpEF) ([place]) 05/25/2019  . Coronary artery disease involving native coronary artery of native heart without angina pectoris 02/14/2018  . Morbid obesity ([place]) 02/14/2018  . Irritable bowel syndrome 06/28/2017  . S/P CABG x 3 07/22/2016  . Multiple vessel coronary artery disease 07/21/2016  . STEMI involving left circumflex coronary artery ([place]) 07/20/2016  . Asthmatic bronchitis 04/26/2016  . Allergic urticaria 04/26/2016  . Environmental allergies 04/26/2016  . Anxiety 04/26/2016  . Sinus node dysfunction ([place]) 04/26/2016  . Pacemaker Medtronic   . Ventricular tachycardia, nonsustained-during exercise testing 03/07/2013  . OSA (obstructive sleep apnea) 05/25/2011  . Hx of adenomatous colonic polyps 05/25/2011  . Migraine 05/25/2011  . Diabetes mellitus type 2, uncontrolled, with complications ([place]) 76/22/6333  . GERD (gastroesophageal reflux disease) 05/25/2011  . NAFLD (nonalcoholic fatty liver disease) 05/25/2011  . Hyperlipidemia LDL goal <70 05/25/2011  . Essential hypertension 09/16/2009  . DIASTOLIC HEART FAILURE, CHRONIC 09/16/2009    Past Medical History:  Diagnosis Date  . Allergy   . Anal fissure   . Anxiety   . Arthritis   . Asthma   . Cataract 2010    left  . Colon polyps    diverticulosis  03-2011/2005  . Depression   . Diabetes mellitus 2012   T2DM  . Diverticulosis   . Fatty liver 04/2011  . GERD  (gastroesophageal reflux disease)   . Hyperlipidemia   . Hypertension severe   . Migraine   . Obesity   . OSA (obstructive sleep apnea)    C-Pap  . Pacemaker Medtronic   . Sinoatrial node dysfunction (HCC)    syncope    Past Surgical History:  Procedure Laterality Date  . ABDOMINAL HYSTERECTOMY  2000   total  . CARDIAC CATHETERIZATION N/A 07/20/2016   Procedure: Left Heart Cath and Coronary Angiography;  Surgeon: Peter M Martinique, MD;  Location: [facility];  Service: Cardiovascular;  Laterality: N/A;  . CATARACT EXTRACTION Left   . CORONARY ARTERY BYPASS GRAFT N/A 07/22/2016   Procedure: CORONARY ARTERY BYPASS GRAFTING (CABG) x three , using left internal mammary artery and right leg greater saphenous vein harvested endoscopically;  Surgeon: Ivin Poot, MD;  Location: [facility];  Service: Open Heart Surgery;  Laterality: N/A;  . EYE SURGERY Right 2010  . OOPHORECTOMY    . PACEMAKER  PLACEMENT    . T/A right knee    . TEE WITHOUT CARDIOVERSION N/A 07/22/2016   Procedure: TRANSESOPHAGEAL ECHOCARDIOGRAM (TEE);  Surgeon: Ivin Poot, MD;  Location: [facility];  Service: Open Heart Surgery;  Laterality: N/A;  . TONSILLECTOMY      Social History   Socioeconomic History  . Marital status: Single    Spouse name: Not on file  . Number of children: 0  . Years of education: Not on file  . Highest education level: Not on file  Occupational History  . Occupation: Surveyor, quantity: [employer]  Tobacco Use  . Smoking status: Former Smoker    Types: Cigarettes    Quit date: 1995    Years since quitting: 26.0  . Smokeless tobacco: Never Used  . Tobacco comment: Quit 20 yrs ago  Substance and Sexual Activity  . Alcohol use: No    Alcohol/week: 0.0 standard drinks  . Drug use: No  . Sexual activity: Yes    Birth control/protection: Post-menopausal, Other-see comments    Comment: Monogamous   Other Topics Concern  . Not on file  Social History Narrative    Single. Exercise: No. Education: College.   Social Determinants of Health   Financial Resource Strain:   . Difficulty of Paying Living Expenses: Not on file  Food Insecurity:   . Worried About Charity fundraiser in the Last Year: Not on file  . Ran Out of Food in the Last Year: Not on file  Transportation Needs:   . Lack of Transportation (Medical): Not on file  . Lack of Transportation (Non-Medical): Not on file  Physical Activity:   . Days of Exercise per Week: Not on file  . Minutes of Exercise per Session: Not on file  Stress:   . Feeling of Stress : Not on file  Social Connections:   . Frequency of Communication with Friends and Family: Not on file  . Frequency of Social Gatherings with Friends and Family: Not on file  . Attends Religious Services: Not on file  . Active Member of Clubs or Organizations: Not on file  . Attends Archivist Meetings: Not on file  . Marital Status: Not on file  Intimate Partner Violence:   . Fear of Current or Ex-Partner: Not on file  . Emotionally Abused: Not on file  . Physically Abused: Not on file  . Sexually Abused: Not on file    Family History  Problem Relation Age of Onset  . Heart disease Mother   . Cancer Mother 43       lung  . Breast cancer Mother        maternal grandmother  . Diabetes Sister        type 2  . Breast cancer Brother   . Cancer Maternal Grandmother        breast  . Leukemia Paternal Grandmother        neoplast  . Aneurysm Paternal Grandfather        abdominal (stomach)  . Heart disease Father   . Colon cancer Neg Hx      Review of Systems  Constitutional: Negative.  Negative for chills and fever.  HENT: Negative.  Negative for congestion and sore throat.   Respiratory: Negative.  Negative for cough and shortness of breath.   Cardiovascular: Negative.  Negative for chest pain and palpitations.  Gastrointestinal: Negative.  Negative for abdominal pain, blood in stool, diarrhea, melena,  nausea  and vomiting.  Genitourinary: Negative.  Negative for dysuria and hematuria.  Musculoskeletal: Negative.  Negative for myalgias.  Skin: Negative.  Negative for rash.  Neurological: Negative.  Negative for dizziness and headaches.  Endo/Heme/Allergies: Negative.   All other systems reviewed and are negative.  Today's Vitals   10/29/19 1150  BP: 129/87  Pulse: 73  Resp: 16  Temp: 98.3 F (36.8 C)  TempSrc: Temporal  SpO2: 93%  Weight: 228 lb (103.4 kg)  Height: _0  (1.575 m)   Body mass index is 41.7 kg/m.   Physical Exam Vitals reviewed.  Constitutional:      Appearance: She is obese.  HENT:     Head: Normocephalic.  Eyes:     Extraocular Movements: Extraocular movements intact.     Conjunctiva/sclera: Conjunctivae normal.     Pupils: Pupils are equal, round, and reactive to light.  Cardiovascular:     Rate and Rhythm: Normal rate and regular rhythm.     Heart sounds: Normal heart sounds.  Pulmonary:     Effort: Pulmonary effort is normal.     Breath sounds: Normal breath sounds.  Musculoskeletal:        General: Normal range of motion.     Cervical back: Normal range of motion.     Right lower leg: No edema.     Left lower leg: No edema.     Comments: Right hand: Positive trigger finger to middle finger  Skin:    General: Skin is warm and dry.     Capillary Refill: Capillary refill takes less than 2 seconds.  Neurological:     General: No focal deficit present.     Mental Status: She is alert and oriented to person, place, and time.  Psychiatric:        Mood and Affect: Mood normal.        Behavior: Behavior normal.     A total of 30 minutes was spent in the room with the patient, greater than 50% of which was in counseling/coordination of care regarding chronic medical problems including diabetes and heart conditions, review of most recent blood work, review of most recent office visit notes, review of all medications, diet and nutrition,prognosis  and need for follow-up.   ASSESSMENT & PLAN: Vinaya was seen today for hand pain and medication refill.  Diagnoses and all orders for this visit:  Type 2 diabetes mellitus with diabetic polyneuropathy, without long-term current use of insulin (HCC) -     Hemoglobin A1c -     Comprehensive metabolic panel -     glucose blood test strip; Use as instructed  Need for prophylactic vaccination against Streptococcus pneumoniae (pneumococcus) -     Pneumococcal polysaccharide vaccine 23-valent greater than or equal to 2yo subcutaneous/IM  Essential hypertension -     Comprehensive metabolic panel  Neuropathy -     traMADol (ULTRAM) 50 MG tablet; Take 1 tablet (50 mg total) by mouth at bedtime as needed.  OSA (obstructive sleep apnea)  Coronary artery disease involving native coronary artery of native heart without angina pectoris  Hyperlipidemia LDL goal <70  Chronic heart failure with preserved ejection fraction (HFpEF) ([place])  Hypertension associated with diabetes ([place])  Morbid obesity with body mass index (BMI) of 40.0 to 44.9 in adult [facility])    Patient Instructions       If you have lab work done today you will be contacted with your lab results within the next 2 weeks.  If you have not  heard from [place] then please contact us. The fastest way to get your results is to register for My Chart.   IF you received an x-ray today, you will receive an invoice from [facility]. Please contact [facility] at [phone] with questions or concerns regarding your invoice.   IF you received labwork today, you will receive an invoice from [facility]. Please contact [facility] at [phone] with questions or concerns regarding your invoice.   Our billing staff will not be able to assist you with questions regarding bills from these companies.  You will be contacted with the lab results as soon as they are available. The fastest way to get your results is to activate  your My Chart account. Instructions are located on the last page of this paperwork. If you have not heard from [place] regarding the results in 2 weeks, please contact this office.     Diabetes Mellitus and Nutrition, Adult When you have diabetes (diabetes mellitus), it is very important to have healthy eating habits because your blood sugar (glucose) levels are greatly affected by what you eat and drink. Eating healthy foods in the appropriate amounts, at about the same times every day, can help you:  Control your blood glucose.  Lower your risk of heart disease.  Improve your blood pressure.  Reach or maintain a healthy weight. Every person with diabetes is different, and each person has different needs for a meal plan. Your health care provider may recommend that you work with a diet and nutrition specialist (dietitian) to make a meal plan that is best for you. Your meal plan may vary depending on factors such as:  The calories you need.  The medicines you take.  Your weight.  Your blood glucose, blood pressure, and cholesterol levels.  Your activity level.  Other health conditions you have, such as heart or kidney disease. How do carbohydrates affect me? Carbohydrates, also called carbs, affect your blood glucose level more than any other type of food. Eating carbs naturally raises the amount of glucose in your blood. Carb counting is a method for keeping track of how many carbs you eat. Counting carbs is important to keep your blood glucose at a healthy level, especially if you use insulin or take certain oral diabetes medicines. It is important to know how many carbs you can safely have in each meal. This is different for every person. Your dietitian can help you calculate how many carbs you should have at each meal and for each snack. Foods that contain carbs include:  Bread, cereal, rice, pasta, and crackers.  Potatoes and corn.  Peas, beans, and lentils.  Milk and  yogurt.  Fruit and juice.  Desserts, such as cakes, cookies, ice cream, and candy. How does alcohol affect me? Alcohol can cause a sudden decrease in blood glucose (hypoglycemia), especially if you use insulin or take certain oral diabetes medicines. Hypoglycemia can be a life-threatening condition. Symptoms of hypoglycemia (sleepiness, dizziness, and confusion) are similar to symptoms of having too much alcohol. If your health care provider says that alcohol is safe for you, follow these guidelines:  Limit alcohol intake to no more than 1 drink per day for nonpregnant women and 2 drinks per day for men. One drink equals 12 oz of beer, 5 oz of wine, or 1 oz of hard liquor.  Do not drink on an empty stomach.  Keep yourself hydrated with water, diet soda, or unsweetened iced tea.  Keep in mind that regular soda,  juice, and other mixers may contain a lot of sugar and must be counted as carbs. What are tips for following this plan?  Reading food labels  Start by checking the serving size on the "Nutrition Facts" label of packaged foods and drinks. The amount of calories, carbs, fats, and other nutrients listed on the label is based on one serving of the item. Many items contain more than one serving per package.  Check the total grams (g) of carbs in one serving. You can calculate the number of servings of carbs in one serving by dividing the total carbs by 15. For example, if a food has 30 g of total carbs, it would be equal to 2 servings of carbs.  Check the number of grams (g) of saturated and trans fats in one serving. Choose foods that have low or no amount of these fats.  Check the number of milligrams (mg) of salt (sodium) in one serving. Most people should limit total sodium intake to less than 2,300 mg per day.  Always check the nutrition information of foods labeled as "low-fat" or "nonfat". These foods may be higher in added sugar or refined carbs and should be avoided.  Talk to  your dietitian to identify your daily goals for nutrients listed on the label. Shopping  Avoid buying canned, premade, or processed foods. These foods tend to be high in fat, sodium, and added sugar.  Shop around the outside edge of the grocery store. This includes fresh fruits and vegetables, bulk grains, fresh meats, and fresh dairy. Cooking  Use low-heat cooking methods, such as baking, instead of high-heat cooking methods like deep frying.  Cook using healthy oils, such as olive, canola, or sunflower oil.  Avoid cooking with butter, cream, or high-fat meats. Meal planning  Eat meals and snacks regularly, preferably at the same times every day. Avoid going long periods of time without eating.  Eat foods high in fiber, such as fresh fruits, vegetables, beans, and whole grains. Talk to your dietitian about how many servings of carbs you can eat at each meal.  Eat 4-6 ounces (oz) of lean protein each day, such as lean meat, chicken, fish, eggs, or tofu. One oz of lean protein is equal to: ? 1 oz of meat, chicken, or fish. ? 1 egg. ?  cup of tofu.  Eat some foods each day that contain healthy fats, such as avocado, nuts, seeds, and fish. Lifestyle  Check your blood glucose regularly.  Exercise regularly as told by your health care provider. This may include: ? 150 minutes of moderate-intensity or vigorous-intensity exercise each week. This could be brisk walking, biking, or water aerobics. ? Stretching and doing strength exercises, such as yoga or weightlifting, at least 2 times a week.  Take medicines as told by your health care provider.  Do not use any products that contain nicotine or tobacco, such as cigarettes and e-cigarettes. If you need help quitting, ask your health care provider.  Work with a Social worker or diabetes educator to identify strategies to manage stress and any emotional and social challenges. Questions to ask a health care provider  Do I need to meet with  a diabetes educator?  Do I need to meet with a dietitian?  What number can I call if I have questions?  When are the best times to check my blood glucose? Where to find more information:  American Diabetes Association: diabetes.org  Academy of Nutrition and Dietetics: www.eatright.[employer] of Diabetes  and Digestive and Kidney Diseases (NIH): [employer] Summary  A healthy meal plan will help you control your blood glucose and maintain a healthy lifestyle.  Working with a diet and nutrition specialist (dietitian) can help you make a meal plan that is best for you.  Keep in mind that carbohydrates (carbs) and alcohol have immediate effects on your blood glucose levels. It is important to count carbs and to use alcohol carefully. This information is not intended to replace advice given to you by your health care provider. Make sure you discuss any questions you have with your health care provider. Document Revised: 09/09/2017 Document Reviewed: 11/01/2016 Elsevier Patient Education  2020 Elsevier Inc.      Agustina Caroli, MD [facility]

## 2019-10-29 NOTE — Patient Instructions (Addendum)
   If you have lab work done today you will be contacted with your lab results within the next 2 weeks.  If you have not heard from us then please contact us. The fastest way to get your results is to register for My Chart.   IF you received an x-ray today, you will receive an invoice from [facility]. Please contact  Radiology at [phone] with questions or concerns regarding your invoice.   IF you received labwork today, you will receive an invoice from [facility]. Please contact [facility] at [phone] with questions or concerns regarding your invoice.   Our billing staff will not be able to assist you with questions regarding bills from these companies.  You will be contacted with the lab results as soon as they are available. The fastest way to get your results is to activate your My Chart account. Instructions are located on the last page of this paperwork. If you have not heard from us regarding the results in 2 weeks, please contact this office.     Diabetes Mellitus and Nutrition, Adult When you have diabetes (diabetes mellitus), it is very important to have healthy eating habits because your blood sugar (glucose) levels are greatly affected by what you eat and drink. Eating healthy foods in the appropriate amounts, at about the same times every day, can help you:  Control your blood glucose.  Lower your risk of heart disease.  Improve your blood pressure.  Reach or maintain a healthy weight. Every person with diabetes is different, and each person has different needs for a meal plan. Your health care provider may recommend that you work with a diet and nutrition specialist (dietitian) to make a meal plan that is best for you. Your meal plan may vary depending on factors such as:  The calories you need.  The medicines you take.  Your weight.  Your blood glucose, blood pressure, and cholesterol levels.  Your activity level.  Other health conditions  you have, such as heart or kidney disease. How do carbohydrates affect me? Carbohydrates, also called carbs, affect your blood glucose level more than any other type of food. Eating carbs naturally raises the amount of glucose in your blood. Carb counting is a method for keeping track of how many carbs you eat. Counting carbs is important to keep your blood glucose at a healthy level, especially if you use insulin or take certain oral diabetes medicines. It is important to know how many carbs you can safely have in each meal. This is different for every person. Your dietitian can help you calculate how many carbs you should have at each meal and for each snack. Foods that contain carbs include:  Bread, cereal, rice, pasta, and crackers.  Potatoes and corn.  Peas, beans, and lentils.  Milk and yogurt.  Fruit and juice.  Desserts, such as cakes, cookies, ice cream, and candy. How does alcohol affect me? Alcohol can cause a sudden decrease in blood glucose (hypoglycemia), especially if you use insulin or take certain oral diabetes medicines. Hypoglycemia can be a life-threatening condition. Symptoms of hypoglycemia (sleepiness, dizziness, and confusion) are similar to symptoms of having too much alcohol. If your health care provider says that alcohol is safe for you, follow these guidelines:  Limit alcohol intake to no more than 1 drink per day for nonpregnant women and 2 drinks per day for men. One drink equals 12 oz of beer, 5 oz of wine, or 1 oz of hard liquor.    Do not drink on an empty stomach.  Keep yourself hydrated with water, diet soda, or unsweetened iced tea.  Keep in mind that regular soda, juice, and other mixers may contain a lot of sugar and must be counted as carbs. What are tips for following this plan?  Reading food labels  Start by checking the serving size on the "Nutrition Facts" label of packaged foods and drinks. The amount of calories, carbs, fats, and other  nutrients listed on the label is based on one serving of the item. Many items contain more than one serving per package.  Check the total grams (g) of carbs in one serving. You can calculate the number of servings of carbs in one serving by dividing the total carbs by 15. For example, if a food has 30 g of total carbs, it would be equal to 2 servings of carbs.  Check the number of grams (g) of saturated and trans fats in one serving. Choose foods that have low or no amount of these fats.  Check the number of milligrams (mg) of salt (sodium) in one serving. Most people should limit total sodium intake to less than 2,300 mg per day.  Always check the nutrition information of foods labeled as "low-fat" or "nonfat". These foods may be higher in added sugar or refined carbs and should be avoided.  Talk to your dietitian to identify your daily goals for nutrients listed on the label. Shopping  Avoid buying canned, premade, or processed foods. These foods tend to be high in fat, sodium, and added sugar.  Shop around the outside edge of the grocery store. This includes fresh fruits and vegetables, bulk grains, fresh meats, and fresh dairy. Cooking  Use low-heat cooking methods, such as baking, instead of high-heat cooking methods like deep frying.  Cook using healthy oils, such as olive, canola, or sunflower oil.  Avoid cooking with butter, cream, or high-fat meats. Meal planning  Eat meals and snacks regularly, preferably at the same times every day. Avoid going long periods of time without eating.  Eat foods high in fiber, such as fresh fruits, vegetables, beans, and whole grains. Talk to your dietitian about how many servings of carbs you can eat at each meal.  Eat 4-6 ounces (oz) of lean protein each day, such as lean meat, chicken, fish, eggs, or tofu. One oz of lean protein is equal to: ? 1 oz of meat, chicken, or fish. ? 1 egg. ?  cup of tofu.  Eat some foods each day that contain  healthy fats, such as avocado, nuts, seeds, and fish. Lifestyle  Check your blood glucose regularly.  Exercise regularly as told by your health care provider. This may include: ? 150 minutes of moderate-intensity or vigorous-intensity exercise each week. This could be brisk walking, biking, or water aerobics. ? Stretching and doing strength exercises, such as yoga or weightlifting, at least 2 times a week.  Take medicines as told by your health care provider.  Do not use any products that contain nicotine or tobacco, such as cigarettes and e-cigarettes. If you need help quitting, ask your health care provider.  Work with a counselor or diabetes educator to identify strategies to manage stress and any emotional and social challenges. Questions to ask a health care provider  Do I need to meet with a diabetes educator?  Do I need to meet with a dietitian?  What number can I call if I have questions?  When are the best times to   times to check my blood glucose? Where to find more information:  American Diabetes Association: diabetes.org  Academy of Nutrition and Dietetics: www.eatright.org  National Institute of Diabetes and Digestive and Kidney Diseases (NIH): www.niddk.nih.gov Summary  A healthy meal plan will help you control your blood glucose and maintain a healthy lifestyle.  Working with a diet and nutrition specialist (dietitian) can help you make a meal plan that is best for you.  Keep in mind that carbohydrates (carbs) and alcohol have immediate effects on your blood glucose levels. It is important to count carbs and to use alcohol carefully. This information is not intended to replace advice given to you by your health care provider. Make sure you discuss any questions you have with your health care provider. Document Revised: 09/09/2017 Document Reviewed: 11/01/2016 Elsevier Patient Education  2020 Elsevier Inc.  

## 2019-10-30 ENCOUNTER — Encounter: Payer: Self-pay | Admitting: Emergency Medicine

## 2019-10-30 ENCOUNTER — Other Ambulatory Visit: Payer: Self-pay | Admitting: Emergency Medicine

## 2019-10-30 DIAGNOSIS — E1142 Type 2 diabetes mellitus with diabetic polyneuropathy: Secondary | ICD-10-CM

## 2019-10-30 DIAGNOSIS — G4733 Obstructive sleep apnea (adult) (pediatric): Secondary | ICD-10-CM | POA: Diagnosis not present

## 2019-10-30 LAB — COMPREHENSIVE METABOLIC PANEL
ALT: 31 IU/L (ref 0–32)
AST: 30 IU/L (ref 0–40)
Albumin/Globulin Ratio: 2.4 — ABNORMAL HIGH (ref 1.2–2.2)
Albumin: 4.8 g/dL (ref 3.8–4.8)
Alkaline Phosphatase: 93 IU/L (ref 39–117)
BUN/Creatinine Ratio: 16 (ref 12–28)
BUN: 15 mg/dL (ref 8–27)
Bilirubin Total: 0.5 mg/dL (ref 0.0–1.2)
CO2: 23 mmol/L (ref 20–29)
Calcium: 9.4 mg/dL (ref 8.7–10.3)
Chloride: 100 mmol/L (ref 96–106)
Creatinine, Ser: 0.91 mg/dL (ref 0.57–1.00)
GFR calc Af Amer: 77 mL/min/{1.73_m2} (ref >59)
GFR calc non Af Amer: 66 mL/min/{1.73_m2} (ref >59)
Globulin, Total: 2 g/dL (ref 1.5–4.5)
Glucose: 217 mg/dL — ABNORMAL HIGH (ref 65–99)
Potassium: 4.6 mmol/L (ref 3.5–5.2)
Sodium: 139 mmol/L (ref 134–144)
Total Protein: 6.8 g/dL (ref 6.0–8.5)

## 2019-10-30 LAB — HEMOGLOBIN A1C
Est. average glucose Bld gHb Est-mCnc: 183 mg/dL
Hgb A1c MFr Bld: 8 % — ABNORMAL HIGH (ref 4.8–5.6)

## 2019-10-30 MED ORDER — METFORMIN HCL 500 MG PO TABS: 500.0000 mg | ORAL_TABLET | Freq: Two times a day (BID) | ORAL | 3 refills | Status: DC

## 2019-10-30 MED ORDER — TRULICITY 0.75 MG/0.5ML ~~LOC~~ SOAJ: 0.7500 mg | SUBCUTANEOUS | 3 refills | Status: DC

## 2019-11-01 ENCOUNTER — Telehealth: Payer: Self-pay | Admitting: Pharmacist

## 2019-11-01 MED ORDER — PRALUENT 75 MG/ML ~~LOC~~ SOAJ: 75.0000 mg | SUBCUTANEOUS | 11 refills | Status: DC

## 2019-11-01 NOTE — Telephone Encounter (Signed)
Pt called clinic inquiring about patient assistance for Praluent. She was in the PASS program last year which now requires patients to have spent $500 out of pocket on medications before they will cover patients. Attempted to apply for [employer] aid but could not - website states "we are not able to proceed with your application as the information provided conflicts with existing patient records. Please email or call us for assistance."  Provided pt with # to [facility] to apply for Praluent pt assistance, rx sent to her local pharmacy as copay is $47 per month before grant.

## 2019-11-07 ENCOUNTER — Ambulatory Visit: Payer: Medicare Other | Admitting: Orthopaedic Surgery

## 2019-11-07 ENCOUNTER — Encounter: Payer: Self-pay | Admitting: Orthopaedic Surgery

## 2019-11-07 ENCOUNTER — Telehealth: Payer: Self-pay | Admitting: *Deleted

## 2019-11-07 ENCOUNTER — Other Ambulatory Visit: Payer: Self-pay

## 2019-11-07 DIAGNOSIS — M65331 Trigger finger, right middle finger: Secondary | ICD-10-CM | POA: Diagnosis not present

## 2019-11-07 MED ORDER — METHYLPREDNISOLONE ACETATE 40 MG/ML IJ SUSP
13.3300 mg | INTRAMUSCULAR | Status: AC | PRN
  Administered 2019-11-07: 13.33 mg

## 2019-11-07 MED ORDER — BUPIVACAINE HCL 0.5 % IJ SOLN
0.3300 mL | INTRAMUSCULAR | Status: AC | PRN
  Administered 2019-11-07: 15:00:00 .33 mL

## 2019-11-07 MED ORDER — LIDOCAINE HCL 1 % IJ SOLN
0.3000 mL | INTRAMUSCULAR | Status: AC | PRN
  Administered 2019-11-07: .3 mL

## 2019-11-07 NOTE — Telephone Encounter (Signed)
On 10/29/2019, faxed Rx for glucose blood test strip to [facility]. Confirmation page at 1:55 pm.

## 2019-11-07 NOTE — Progress Notes (Signed)
Office Visit Note   Patient: [name] [name]           Date of Birth: 1956-03-06           MRN: [id number] Visit Date: 11/07/2019              Requested by: Horald Pollen, MD [address] [place],  [place] [address] PCP: Horald Pollen, MD   Assessment & Plan: Visit Diagnoses:  1. Trigger finger, right middle finger     Plan: My impression is right long trigger finger.  Cortisone injection performed today which the patient tolerated well.  Patient advised to return if she did not receive any relief.  She will continue to work on better diabetic control.  Follow-up as needed.  Follow-Up Instructions: Return if symptoms worsen or fail to improve.   Orders:  No orders of the defined types were placed in this encounter.  No orders of the defined types were placed in this encounter.     Procedures: Hand/UE Inj: R long A1 for trigger finger on 11/07/2019 3:29 PM Indications: pain Details: 25 G needle Medications: 0.3 mL lidocaine 1 %; 0.33 mL bupivacaine 0.5 %; 13.33 mg methylPREDNISolone acetate 40 MG/ML Outcome: tolerated well, no immediate complications Consent was given by the patient. Patient was prepped and draped in the usual sterile fashion.       Clinical Data: No additional findings.   Subjective: Chief Complaint  Patient presents with  . Right Middle Finger - Pain    [name] [name] is a very pleasant 64 year old female comes in for evaluation of a right trigger long finger.  She denies any injuries.  She states that she will often wake up with her long finger flexed down.  She has tried taking tramadol aspirin without any improvement.  She denies history of rheumatoid arthritis.  She has trouble with ADLs such as opening jars because of the finger.   Review of Systems  Constitutional: Negative.   HENT: Negative.   Eyes: Negative.   Respiratory: Negative.   Cardiovascular: Negative.   Endocrine: Negative.   Musculoskeletal: Negative.     Neurological: Negative.   Hematological: Negative.   Psychiatric/Behavioral: Negative.   All other systems reviewed and are negative.    Objective: Vital Signs: There were no vitals taken for this visit.  Physical Exam Vitals and nursing note reviewed.  Constitutional:      Appearance: She is well-developed.  HENT:     Head: Normocephalic and atraumatic.  Pulmonary:     Effort: Pulmonary effort is normal.  Abdominal:     Palpations: Abdomen is soft.  Musculoskeletal:     Cervical back: Neck supple.  Skin:    General: Skin is warm.     Capillary Refill: Capillary refill takes less than 2 seconds.  Neurological:     Mental Status: She is alert and oriented to person, place, and time.  Psychiatric:        Behavior: Behavior normal.        Thought Content: Thought content normal.        Judgment: Judgment normal.     Ortho Exam Right long finger shows tenderness in the palm overlying the metacarpal head.  There is mild triggering with finger range of motion. Specialty Comments:  No specialty comments available.  Imaging: No results found.   PMFS History: Patient Active Problem List   Diagnosis Date Noted  . Trigger finger, right middle finger 11/07/2019  . Hypertension associated with diabetes ([place])  10/29/2019  . Chronic heart failure with preserved ejection fraction (HFpEF) ([place]) 05/25/2019  . Coronary artery disease involving native coronary artery of native heart without angina pectoris 02/14/2018  . Morbid obesity with body mass index (BMI) of 40.0 to 44.9 in adult ([place]) 02/14/2018  . Irritable bowel syndrome 06/28/2017  . S/P CABG x 3 07/22/2016  . Multiple vessel coronary artery disease 07/21/2016  . STEMI involving left circumflex coronary artery ([place] [place]) 07/20/2016  . Asthmatic bronchitis 04/26/2016  . Allergic urticaria 04/26/2016  . Environmental allergies 04/26/2016  . Anxiety 04/26/2016  . Sinus node dysfunction ([place]) 04/26/2016  . Pacemaker  Medtronic   . Ventricular tachycardia, nonsustained-during exercise testing 03/07/2013  . OSA (obstructive sleep apnea) 05/25/2011  . Hx of adenomatous colonic polyps 05/25/2011  . Migraine 05/25/2011  . Diabetes mellitus type 2, uncontrolled, with complications ([place]) 81/82/9937  . GERD (gastroesophageal reflux disease) 05/25/2011  . NAFLD (nonalcoholic fatty liver disease) 05/25/2011  . Hyperlipidemia LDL goal <70 05/25/2011  . Essential hypertension 09/16/2009  . DIASTOLIC HEART FAILURE, CHRONIC 09/16/2009   Past Medical History:  Diagnosis Date  . Allergy   . Anal fissure   . Anxiety   . Arthritis   . Asthma   . Cataract 2010    left  . Colon polyps    diverticulosis  03-2011/2005  . Depression   . Diabetes mellitus 2012   T2DM  . Diverticulosis   . Fatty liver 04/2011  . GERD (gastroesophageal reflux disease)   . Hyperlipidemia   . Hypertension severe   . Migraine   . Obesity   . OSA (obstructive sleep apnea)    C-Pap  . Pacemaker Medtronic   . Sinoatrial node dysfunction ([facility])    syncope    Family History  Problem Relation Age of Onset  . Heart disease Mother   . Cancer Mother 61       lung  . Breast cancer Mother        maternal grandmother  . Diabetes Sister        type 2  . Breast cancer Brother   . Cancer Maternal Grandmother        breast  . Leukemia Paternal Grandmother        neoplast  . Aneurysm Paternal Grandfather        abdominal (stomach)  . Heart disease Father   . Colon cancer Neg Hx     Past Surgical History:  Procedure Laterality Date  . ABDOMINAL HYSTERECTOMY  2000   total  . CARDIAC CATHETERIZATION N/A 07/20/2016   Procedure: Left Heart Cath and Coronary Angiography;  Surgeon: Peter M Martinique, MD;  Location: [facility];  Service: Cardiovascular;  Laterality: N/A;  . CATARACT EXTRACTION Left   . CORONARY ARTERY BYPASS GRAFT N/A 07/22/2016   Procedure: CORONARY ARTERY BYPASS GRAFTING (CABG) x three , using left internal  mammary artery and right leg greater saphenous vein harvested endoscopically;  Surgeon: Ivin Poot, MD;  Location: [facility];  Service: Open Heart Surgery;  Laterality: N/A;  . EYE SURGERY Right 2010  . OOPHORECTOMY    . PACEMAKER PLACEMENT    . T/A right knee    . TEE WITHOUT CARDIOVERSION N/A 07/22/2016   Procedure: TRANSESOPHAGEAL ECHOCARDIOGRAM (TEE);  Surgeon: Ivin Poot, MD;  Location: [facility];  Service: Open Heart Surgery;  Laterality: N/A;  . TONSILLECTOMY     Social History   Occupational History  . Occupation: Surveyor, quantity: [employer]  Tobacco Use  . Smoking status: Former Smoker    Types: Cigarettes    Quit date: 1995    Years since quitting: 26.0  . Smokeless tobacco: Never Used  . Tobacco comment: Quit 20 yrs ago  Substance and Sexual Activity  . Alcohol use: No    Alcohol/week: 0.0 standard drinks  . Drug use: No  . Sexual activity: Yes    Birth control/protection: Post-menopausal, Other-see comments    Comment: Monogamous

## 2019-11-09 DIAGNOSIS — R1030 Lower abdominal pain, unspecified: Secondary | ICD-10-CM | POA: Diagnosis not present

## 2019-11-09 DIAGNOSIS — Z86010 Personal history of colonic polyps: Secondary | ICD-10-CM | POA: Diagnosis not present

## 2019-11-09 DIAGNOSIS — R10813 Right lower quadrant abdominal tenderness: Secondary | ICD-10-CM | POA: Diagnosis not present

## 2019-11-09 DIAGNOSIS — K580 Irritable bowel syndrome with diarrhea: Secondary | ICD-10-CM | POA: Diagnosis not present

## 2019-11-09 DIAGNOSIS — R10812 Left upper quadrant abdominal tenderness: Secondary | ICD-10-CM | POA: Diagnosis not present

## 2019-11-12 ENCOUNTER — Telehealth: Payer: Self-pay | Admitting: *Deleted

## 2019-11-12 NOTE — Telephone Encounter (Signed)
Spoke to patient she rec'd Acc-Chek strips and lancet already, now she is waiting for the monitor. Faxed request for Accu-Chek kit.

## 2019-11-14 ENCOUNTER — Telehealth: Payer: Self-pay | Admitting: *Deleted

## 2019-11-14 NOTE — Telephone Encounter (Signed)
On 11/12/2019, faxed medical clarification request for GUIDE Accu-chek test strips and softclix lancets. Patient test glucose twice a day, per patient when I spoke to her.

## 2019-11-15 DIAGNOSIS — R109 Unspecified abdominal pain: Secondary | ICD-10-CM | POA: Diagnosis not present

## 2019-11-28 ENCOUNTER — Telehealth: Payer: Self-pay | Admitting: Internal Medicine

## 2019-11-28 NOTE — Telephone Encounter (Signed)

## 2019-11-29 ENCOUNTER — Telehealth (INDEPENDENT_AMBULATORY_CARE_PROVIDER_SITE_OTHER): Payer: Medicare Other | Admitting: Internal Medicine

## 2019-11-29 ENCOUNTER — Encounter: Payer: Self-pay | Admitting: Internal Medicine

## 2019-11-29 ENCOUNTER — Other Ambulatory Visit: Payer: Self-pay

## 2019-11-29 VITALS — BP 118/87 | HR 79 | Ht 62.0 in | Wt 217.0 lb

## 2019-11-29 DIAGNOSIS — I495 Sick sinus syndrome: Secondary | ICD-10-CM

## 2019-11-29 DIAGNOSIS — I10 Essential (primary) hypertension: Secondary | ICD-10-CM | POA: Diagnosis not present

## 2019-11-29 DIAGNOSIS — I5032 Chronic diastolic (congestive) heart failure: Secondary | ICD-10-CM

## 2019-11-29 DIAGNOSIS — I2510 Atherosclerotic heart disease of native coronary artery without angina pectoris: Secondary | ICD-10-CM

## 2019-11-29 DIAGNOSIS — E118 Type 2 diabetes mellitus with unspecified complications: Secondary | ICD-10-CM

## 2019-11-29 DIAGNOSIS — E785 Hyperlipidemia, unspecified: Secondary | ICD-10-CM | POA: Diagnosis not present

## 2019-11-29 DIAGNOSIS — IMO0002 Reserved for concepts with insufficient information to code with codable children: Secondary | ICD-10-CM

## 2019-11-29 DIAGNOSIS — E1165 Type 2 diabetes mellitus with hyperglycemia: Secondary | ICD-10-CM

## 2019-11-29 MED ORDER — CLOPIDOGREL BISULFATE 75 MG PO TABS: 75.0000 mg | ORAL_TABLET | Freq: Every day | ORAL | 1 refills | Status: DC

## 2019-11-29 NOTE — Progress Notes (Signed)
Virtual Visit via Telephone Note   This visit type was conducted due to national recommendations for restrictions regarding the COVID-19 Pandemic (e.g. social distancing) in an effort to limit this patient's exposure and mitigate transmission in our community.  Due to her co-morbid illnesses, this patient is at least at moderate risk for complications without adequate follow up.  This format is felt to be most appropriate for this patient at this time.  The patient did not have access to video technology/had technical difficulties with video requiring transitioning to audio format only (telephone).  All issues noted in this document were discussed and addressed.  No physical exam could be performed with this format.  Please refer to the patient's chart for her  consent to telehealth for [facility].   Date:  11/29/2019   ID:  [name] [name], DOB 09/02/1956, MRN [id number]  Patient Location: Home Provider Location: Home  PCP:  Horald Pollen, MD  Cardiologist:  Nelva Bush, MD  Electrophysiologist:  None   Evaluation Performed:  Follow-Up Visit  Chief Complaint:  Follow-up CAD and HFpEF  History of Present Illness:    [name] [name] is a 64 y.o. female with history of coronary artery disease status post CABG (07/2016) with postoperative atrial fibrillation, chronic HFpEF, hypertension, hyperlipidemia,HFpEF,sinus node dysfunction status post permanent pacemaker, and sleep apnea on CPAP.  I last saw [name] [name] in 08/2019, at which time she reported improved shortness of breath following addition of HCTZ.  She continued to have occasional leg edema.  She also noted sporadic "shooting pains" in her chest.  We agreed to add furosemide 20 mg daily as needed for edema.  Today, [name] [name] reports that she is feeling better with resolution of leg edema and much less shortness of breath.  She is currently using furosemide every other day.  She denies chest pain, palpitations, and  lightheadedness.  She notes that she is not taking aspirin, as it has caused her to develop sores in her mouth.  She was recently started on Trulicity with improved blood sugar.  Her weight has also fallen ~5 pounds over the last month.  The patient does not have symptoms concerning for COVID-19 infection (fever, chills, cough, or new shortness of breath).    Past Medical History:  Diagnosis Date  . Allergy   . Anal fissure   . Anxiety   . Arthritis   . Asthma   . Cataract 2010    left  . Colon polyps    diverticulosis  03-2011/2005  . Depression   . Diabetes mellitus 2012   T2DM  . Diverticulosis   . Fatty liver 04/2011  . GERD (gastroesophageal reflux disease)   . Hyperlipidemia   . Hypertension severe   . Migraine   . Obesity   . OSA (obstructive sleep apnea)    C-Pap  . Pacemaker Medtronic   . Sinoatrial node dysfunction (HCC)    syncope   Past Surgical History:  Procedure Laterality Date  . ABDOMINAL HYSTERECTOMY  2000   total  . CARDIAC CATHETERIZATION N/A 07/20/2016   Procedure: Left Heart Cath and Coronary Angiography;  Surgeon: Peter M Martinique, MD;  Location: [place] [facility];  Service: Cardiovascular;  Laterality: N/A;  . CATARACT EXTRACTION Left   . CORONARY ARTERY BYPASS GRAFT N/A 07/22/2016   Procedure: CORONARY ARTERY BYPASS GRAFTING (CABG) x three , using left internal mammary artery and right leg greater saphenous vein harvested endoscopically;  Surgeon: Ivin Poot, MD;  Location: [facility]  OR;  Service: Open Heart Surgery;  Laterality: N/A;  . EYE SURGERY Right 2010  . OOPHORECTOMY    . PACEMAKER PLACEMENT    . T/A right knee    . TEE WITHOUT CARDIOVERSION N/A 07/22/2016   Procedure: TRANSESOPHAGEAL ECHOCARDIOGRAM (TEE);  Surgeon: Ivin Poot, MD;  Location: [facility];  Service: Open Heart Surgery;  Laterality: N/A;  . TONSILLECTOMY       Current Meds  Medication Sig  . albuterol (VENTOLIN HFA) 108 (90 Base) MCG/ACT inhaler Inhale 1-2 puffs into the  lungs every 6 (six) hours as needed for wheezing or shortness of breath.  . Alirocumab (PRALUENT) 75 MG/ML SOAJ Inject 75 mg into the skin every 14 (fourteen) days.  Marland Kitchen atorvastatin (LIPITOR) 80 MG tablet TAKE ONE TABLET BY MOUTH ONCE DAILY AT 6 PM  . cetirizine (ZYRTEC) 10 MG tablet Take 10 mg by mouth daily.    . citalopram (CELEXA) 20 MG tablet Take 1 tablet (20 mg total) by mouth daily.  . cyclobenzaprine (FLEXERIL) 10 MG tablet TAKE 1 TABLET BY MOUTH AT BEDTIME AS NEEDED AND MAY REPEAT DOSE ONE TIME IF NEEDED FOR MUSCLE SPASMS, CAREFUL OF SEDATION  . Dulaglutide (TRULICITY) A999333 0000000 SOPN Inject 0.75 mg into the skin once a week.  . fluticasone (FLONASE) 50 MCG/ACT nasal spray Place 2 sprays into both nostrils daily.  . fluticasone (FLOVENT HFA) 110 MCG/ACT inhaler Inhale 2 puffs into the lungs 2 (two) times daily.  . furosemide (LASIX) 20 MG tablet Take 1 tablet (20 mg total) by mouth daily as needed for edema (swelling).  . gabapentin (NEURONTIN) 300 MG capsule TAKE 2 CAPSULES BY MOUTH IN THE MORNING AND 3 CAPSULES  AT BEDTIME  . glimepiride (AMARYL) 4 MG tablet TAKE 1/2 (ONE-HALF) TABLET BY MOUTH TWICE DAILY  . glucose blood test strip Use as instructed  . hydrochlorothiazide (HYDRODIURIL) 25 MG tablet Take 1 tablet (25 mg total) by mouth daily.  Marland Kitchen lidocaine (XYLOCAINE) 2 % solution Use as directed 5-10 mLs in the mouth or throat every 3 (three) hours as needed.  Marland Kitchen lisinopril (ZESTRIL) 40 MG tablet Take 1 tablet (40 mg total) by mouth daily.  . metFORMIN (GLUCOPHAGE) 500 MG tablet Take 1 tablet (500 mg total) by mouth 2 (two) times daily with a meal.  . metoprolol succinate (TOPROL-XL) 50 MG 24 hr tablet Take 1 tablet (50 mg total) by mouth 2 (two) times daily. Take with or immediately following a meal.  . Multiple Vitamin (MULTIVITAMIN) tablet Take 1 tablet by mouth daily.  Marland Kitchen nystatin cream (MYCOSTATIN) Apply 1 application topically as needed for dry skin.  Marland Kitchen omeprazole (PRILOSEC) 20  MG capsule Take 1 capsule (20 mg total) by mouth daily.  . ondansetron (ZOFRAN-ODT) 8 MG disintegrating tablet DISSOLVE 1 ON TONGUE EVERY 12 HOURS AS NEEDED FOR NAUSEA  . traMADol (ULTRAM) 50 MG tablet Take 1 tablet (50 mg total) by mouth at bedtime as needed.  . triamcinolone cream (KENALOG) 0.1 % Apply 1 application topically as needed.     Allergies:   Aspirin, Adhesive [tape], Penicillins, Red yeast rice, and Sulfa drugs cross reactors   Social History   Tobacco Use  . Smoking status: Former Smoker    Types: Cigarettes    Quit date: 1995    Years since quitting: 26.1  . Smokeless tobacco: Never Used  . Tobacco comment: Quit 20 yrs ago  Substance Use Topics  . Alcohol use: No    Alcohol/week: 0.0 standard drinks  .  Drug use: No     Family Hx: The patient's family history includes Aneurysm in her paternal grandfather; Breast cancer in her brother and mother; Cancer in her maternal grandmother; Cancer (age of onset: 36) in her mother; Diabetes in her sister; Heart disease in her father and mother; Leukemia in her paternal grandmother. There is no history of Colon cancer.  ROS:   Please see the history of present illness.  All other systems reviewed and are negative.   Prior CV studies:   The following studies were reviewed today:  Cath/PCI:  LHC (07/20/16): The LMCA normal. LAD with 90% proximal, 80% mid, and 70% distal stenoses as well as 90% stenosis of the ostium of a large septal branch. LCx with 100% ostial/proximal occlusion with right to left collaterals. PL branch with 100% occlusion with filling via left-to-right collaterals. Mid anterior hypokinesis with LVEF of 50-55%.  CV Surgery:  CABG (07/25/16): LIMA to LAD, SVG to diagonal, and SVG to OM).  EP Procedures and Devices:  Dual-chamber pacemaker (09/10/08, Dr. Caryl Comes): Medtronic  Non-Invasive Evaluation(s):  TTE (07/21/16): Normal LV size and wall thickness. LVEF 60-65% with normal wall motion. Grade  1 diastolic dysfunction. Mild left atrial enlargement. Normal RV size and function.  Exercise MPI (01/30/13): Low risk study without ischemia. Poor exercise capacity. Wide-complex tachycardia during stress (question rate related LBBB). LVEF 73%.  Labs/Other Tests and Data Reviewed:    EKG:  No ECG reviewed.  Recent Labs: 10/29/2019: ALT 31; BUN 15; Creatinine, Ser 0.91; Potassium 4.6; Sodium 139   Recent Lipid Panel Lab Results  Component Value Date/Time   CHOL 118 08/29/2019 12:01 PM   TRIG 181 (H) 08/29/2019 12:01 PM   HDL 43 08/29/2019 12:01 PM   CHOLHDL 2.7 08/29/2019 12:01 PM   CHOLHDL 6.5 07/21/2016 05:37 AM   LDLCALC 45 08/29/2019 12:01 PM   LDLDIRECT 54 08/29/2019 12:01 PM   LDLDIRECT 195 (H) 04/18/2013 10:18 AM    Wt Readings from Last 3 Encounters:  11/29/19 217 lb (98.4 kg)  10/29/19 228 lb (103.4 kg)  09/13/19 226 lb (102.5 kg)     Objective:    Vital Signs:  BP 118/87   Pulse 79   Ht 5\' 2"  (1.575 m)   Wt 217 lb (98.4 kg)   BMI 39.69 kg/m    VITAL SIGNS:  reviewed  ASSESSMENT & PLAN:    Coronary artery disease: No signs or symptoms to suggest worsening coronary insufficiency s/p CABG in 2017.  Given intolerance to aspirin, we will start clopidogrel 75 mg daily for secondary prevention, as well as continue aggressive lipid therapy.  Chronic HFpEF: Ms. Leano reports significant improvement in edema and dyspnea with addition of HCTZ and as needed furosemide.  We will continue current regimen.  Hypertension: DBP mildly elevated today, though overall home blood pressures have been well-controlled (goal  130/80).  We will continue current regimen of HCTZ, metoprolol, and lisinopril.  Hyperlipidemia: LDL at goal (<70) with combination of Praluent and atorvastatin.  Continue current regimen and work on lifestyle modifications.  Morbid obesity: BMI remains > 35 with multiple comorbidities (CAD, HFpEF, DM, HTN, and HLD).  Ms. Carothers has lost 5 pounds over the  last month; I have encouraged her to continue working on weight loss through diet and exercise.  Diabetes mellitus: Most recent A1c above goal at 8.0, though Ms. Ventresca reports improving CBG's with recent addition of Trulicity.  She should continue current medications and work on lifestyle modifications.  Ongoing management per  PCP.  Sinus node dysfunction: No symptoms reported.  Most recent device interrogation in 10/2018 within normal limits.  Continue follow-up through device clinic.  Follow-up: Return to clinic in 6 months.  Time:   Today, I have spent 12 minutes with the patient with telehealth technology discussing the above problems.     Medication Adjustments/Labs and Tests Ordered: Current medicines are reviewed at length with the patient today.  Concerns regarding medicines are outlined above.   Tests Ordered: None.  Medication Changes: Start clopidogrel 75 mg daily.  Follow Up:  In Person in 6 month(s)  Signed, Nelva Bush, MD  11/29/2019 8:53 AM    [place]

## 2019-11-29 NOTE — Patient Instructions (Signed)
Medication Instructions:  Your physician has recommended you make the following change in your medication:  1- START Clopidogrel 75 mg (1 tablet) by mouth once a day.  *If you need a refill on your cardiac medications before your next appointment, please call your pharmacy*  Lab Work: none If you have labs (blood work) drawn today and your tests are completely normal, you will receive your results only by: Marland Kitchen MyChart Message (if you have MyChart) OR . A paper copy in the mail If you have any lab test that is abnormal or we need to change your treatment, we will call you to review the results.  Testing/Procedures: none  Follow-Up: At [facility], you and your health needs are our priority.  As part of our continuing mission to provide you with exceptional heart care, we have created designated Provider Care Teams.  These Care Teams include your primary Cardiologist (physician) and Advanced Practice Providers (APPs -  Physician Assistants and Nurse Practitioners) who all work together to provide you with the care you need, when you need it.  Your next appointment:   6 month(s)  The format for your next appointment:   In Person  Provider:    You may see Nelva Bush, MD or one of the following Advanced Practice Providers on your designated Care Team:    Murray Hodgkins, NP  Christell Faith, PA-C  Marrianne Mood, PA-C

## 2019-11-30 DIAGNOSIS — G4733 Obstructive sleep apnea (adult) (pediatric): Secondary | ICD-10-CM | POA: Diagnosis not present

## 2019-12-14 ENCOUNTER — Ambulatory Visit (INDEPENDENT_AMBULATORY_CARE_PROVIDER_SITE_OTHER): Payer: Medicare Other | Admitting: *Deleted

## 2019-12-14 DIAGNOSIS — I495 Sick sinus syndrome: Secondary | ICD-10-CM

## 2019-12-17 LAB — CUP PACEART REMOTE DEVICE CHECK
Battery Impedance: 1098 Ohm
Battery Remaining Longevity: 69 mo
Battery Voltage: 2.77 V
Brady Statistic AP VP Percent: 0 %
Brady Statistic AP VS Percent: 0 %
Brady Statistic AS VP Percent: 0 %
Brady Statistic AS VS Percent: 100 %
Date Time Interrogation Session: 20210305203122
Implantable Lead Implant Date: 20091201
Implantable Lead Implant Date: 20091201
Implantable Lead Location: 753859
Implantable Lead Location: 753860
Implantable Lead Model: 5076
Implantable Lead Model: 5076
Implantable Pulse Generator Implant Date: 20091201
Lead Channel Impedance Value: 401 Ohm
Lead Channel Impedance Value: 524 Ohm
Lead Channel Pacing Threshold Amplitude: 0.5 V
Lead Channel Pacing Threshold Amplitude: 0.625 V
Lead Channel Pacing Threshold Pulse Width: 0.4 ms
Lead Channel Pacing Threshold Pulse Width: 0.4 ms
Lead Channel Setting Pacing Amplitude: 2 V
Lead Channel Setting Pacing Amplitude: 2.5 V
Lead Channel Setting Pacing Pulse Width: 0.4 ms
Lead Channel Setting Sensing Sensitivity: 4 mV

## 2019-12-17 NOTE — Progress Notes (Signed)
PPM Remote  

## 2019-12-28 DIAGNOSIS — G4733 Obstructive sleep apnea (adult) (pediatric): Secondary | ICD-10-CM | POA: Diagnosis not present

## 2019-12-31 ENCOUNTER — Encounter: Payer: Self-pay | Admitting: Neurology

## 2019-12-31 ENCOUNTER — Telehealth: Payer: Self-pay

## 2019-12-31 ENCOUNTER — Ambulatory Visit: Payer: Medicare Other | Admitting: Neurology

## 2019-12-31 NOTE — Telephone Encounter (Signed)
Pt did not show for their appt with Dr. Athar today.  

## 2020-01-02 ENCOUNTER — Encounter: Payer: Self-pay | Admitting: Neurology

## 2020-01-02 ENCOUNTER — Other Ambulatory Visit: Payer: Self-pay

## 2020-01-02 ENCOUNTER — Ambulatory Visit: Payer: Medicare Other | Admitting: Neurology

## 2020-01-02 VITALS — BP 137/81 | HR 64 | Temp 97.1°F | Ht 62.0 in | Wt 223.5 lb

## 2020-01-02 DIAGNOSIS — G4761 Periodic limb movement disorder: Secondary | ICD-10-CM

## 2020-01-02 DIAGNOSIS — G4733 Obstructive sleep apnea (adult) (pediatric): Secondary | ICD-10-CM

## 2020-01-02 DIAGNOSIS — Z9989 Dependence on other enabling machines and devices: Secondary | ICD-10-CM

## 2020-01-02 DIAGNOSIS — G2581 Restless legs syndrome: Secondary | ICD-10-CM

## 2020-01-02 NOTE — Patient Instructions (Signed)
Please continue using your autoPAP regularly. While your insurance requires that you use PAP at least 4 hours each night on 70% of the nights, I recommend, that you not skip any nights and use it throughout the night if you can. Getting used to PAP and staying with the treatment long term does take time and patience and discipline. Untreated obstructive sleep apnea when it is moderate to severe can have an adverse impact on cardiovascular health and raise her risk for heart disease, arrhythmias, hypertension, congestive heart failure, stroke and diabetes. Untreated obstructive sleep apnea causes sleep disruption, nonrestorative sleep, and sleep deprivation. This can have an impact on your day to day functioning and cause daytime sleepiness and impairment of cognitive function, memory loss, mood disturbance, and problems focussing. Using PAP regularly can improve these symptoms.  Keep up the good work! You are fully compliant with your new machine.   I will see you in one year! If you have an increase in your restless leg symptoms and persistent sleep disturbance from having restless legs, please call and we can make an interim appointment.

## 2020-01-02 NOTE — Progress Notes (Signed)
Subjective:    Patient ID: [name] [name] is a 64 y.o. female.  HPI     Interim history:   [name] [name] is a 64 year old right-handed woman with an underlying complex medical history of hypertension, coronary artery disease, status post CABG (2017), history of postoperative A. 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 The patient is unaccompanied today; she missed an appointment on 12/31/19. I first met her on 09/13/19, at the request of her primary care physician, at which time she reported a longstanding history of obstructive sleep apnea.  She was advised to get an updated sleep study to requalify for new equipment.  She had a baseline sleep study on 09/27/2019 which showed a sleep latency of 22 minutes, sleep efficiency of 94.6%, she had an increased percentage of slow-wave sleep and a markedly reduced percentage of REM sleep, overall AHI was 7/h, average oxygen saturation 94%, nadir was 85%.  She was advised to proceed with new equipment and treatment with AutoPap therapy at home.  She had moderate PLM's with mild arousals during her sleep study.  Today, 01/02/2020: I reviewed her AutoPap compliance data from 12/02/2019 through 12/31/2019, which is a total of 30 days, during which time she used her machine every night with percent used days greater than 4 hours at 100%, indicating superb compliance with an average usage of 9 hours and 25 minutes, residual AHI at goal at 1.8/h, average pressure for the 95th percentile at 9.2 cm, leak acceptable with a 95th percentile at 11.1 L/min on a pressure range of 5 cm to 11 cm with EPR. She reports having adapted well to her AutoPap, she does not mind the AutoPap feature.  She is using nasal pillows  without difficulty.  She is fully compliant with it and doing well, benefits from treatment, does not typically wake up in the middle of the night.  Sometimes her restless leg symptoms flareup but overall, she is doing fairly well, she does take gabapentin consistently for her neuropathy and she has tramadol for as needed use.  She takes Flexeril as needed for neck pain.  She is not keen on adding yet another medication for her restless leg syndrome and feels she can manage fairly well.  She got both her doses of the Covid vaccine without any problems.   The patient's allergies, current medications, family history, past medical history, past social history, past surgical history and problem list were reviewed and updated as appropriate.   Previously:   09/13/19: (She) was diagnosed with obstructive sleep apnea many years ago, probably 20 years ago.  She has an older CPAP machine.  A CPAP compliance download was not possible.  Prior sleep study results are not available for my review today.  Her Epworth sleepiness score is 6 out of 24.  She reports being consistent with her CPAP.  She bought an auto Pap about 2 years ago online, her settings are 12 to 20 cm per her report.  She had significant sleep disruption before her sleep apnea diagnosis including significant nocturia and severe restless leg symptoms.  She still has some difficulty maintaining sleep and sometimes going to sleep.  She lived with her sister who was sadly passed away recently, sister had significant cardiac disease and also sleep apnea on a BiPAP  machine.  Patient would like to donate her late sisters BiPAP machine.  Patient's weight has been fluctuating over time, compared to her maximum weight she had lost about 50 pounds at one point but since the pandemic she has gained some weight back, probably about 30 pounds lighter than her max weight at this point.  She goes to bed around 9, rise time is around 7 currently as she is helping her  sister's grandchildren with home schooling.  She goes to their home.  Patient is retired.  She would be open to getting reevaluated for her sleep apnea and hopefully get new equipment.  She has been using a small nasal mask.  She is single, has no children of her own, she has 1 outside feral cat.  Her Past Medical History Is Significant For: Past Medical History:  Diagnosis Date  . Allergy   . Anal fissure   . Anxiety   . Arthritis   . Asthma   . Cataract 2010    left  . Colon polyps    diverticulosis  03-2011/2005  . Depression   . Diabetes mellitus 2012   T2DM  . Diverticulosis   . Fatty liver 04/2011  . GERD (gastroesophageal reflux disease)   . Hyperlipidemia   . Hypertension severe   . Migraine   . Obesity   . OSA (obstructive sleep apnea)    C-Pap  . Pacemaker Medtronic   . Sinoatrial node dysfunction (HCC)    syncope    Her Past Surgical History Is Significant For: Past Surgical History:  Procedure Laterality Date  . ABDOMINAL HYSTERECTOMY  2000   total  . CARDIAC CATHETERIZATION N/A 07/20/2016   Procedure: Left Heart Cath and Coronary Angiography;  Surgeon: Peter M Martinique, MD;  Location: [place] [facility];  Service: Cardiovascular;  Laterality: N/A;  . CATARACT EXTRACTION Left   . CORONARY ARTERY BYPASS GRAFT N/A 07/22/2016   Procedure: CORONARY ARTERY BYPASS GRAFTING (CABG) x three , using left internal mammary artery and right leg greater saphenous vein harvested endoscopically;  Surgeon: Ivin Poot, MD;  Location: [facility];  Service: Open Heart Surgery;  Laterality: N/A;  . EYE SURGERY Right 2010  . OOPHORECTOMY    . PACEMAKER PLACEMENT    . T/A right knee    . TEE WITHOUT CARDIOVERSION N/A 07/22/2016   Procedure: TRANSESOPHAGEAL ECHOCARDIOGRAM (TEE);  Surgeon: Ivin Poot, MD;  Location: [facility];  Service: Open Heart Surgery;  Laterality: N/A;  . TONSILLECTOMY      Her Family History Is Significant For: Family History  Problem Relation Age of Onset   . Heart disease Mother   . Cancer Mother 11       lung  . Breast cancer Mother        maternal grandmother  . Diabetes Sister        type 2  . Breast cancer Brother   . Cancer Maternal Grandmother        breast  . Leukemia Paternal Grandmother        neoplast  . Aneurysm Paternal Grandfather        abdominal (stomach)  . Heart disease Father   . Colon cancer Neg Hx     Her Social History Is Significant For: Social History   Socioeconomic History  . Marital status: Single    Spouse name: Not on file  . Number of children: 0  . Years of education: Not on file  . Highest education level: Not  on file  Occupational History  . Occupation: Surveyor, quantity: [employer] [place]  Tobacco Use  . Smoking status: Former Smoker    Types: Cigarettes    Quit date: 1995    Years since quitting: 26.2  . Smokeless tobacco: Never Used  . Tobacco comment: Quit 20 yrs ago  Substance and Sexual Activity  . Alcohol use: No    Alcohol/week: 0.0 standard drinks  . Drug use: No  . Sexual activity: Yes    Birth control/protection: Post-menopausal, Other-see comments    Comment: Monogamous   Other Topics Concern  . Not on file  Social History Narrative   Single. Exercise: No. Education: College.   Social Determinants of Health   Financial Resource Strain:   . Difficulty of Paying Living Expenses:   Food Insecurity:   . Worried About Charity fundraiser in the Last Year:   . Arboriculturist in the Last Year:   Transportation Needs:   . Film/video editor (Medical):   Marland Kitchen Lack of Transportation (Non-Medical):   Physical Activity:   . Days of Exercise per Week:   . Minutes of Exercise per Session:   Stress:   . Feeling of Stress :   Social Connections:   . Frequency of Communication with Friends and Family:   . Frequency of Social Gatherings with Friends and Family:   . Attends Religious Services:   . Active Member of Clubs or Organizations:   . Attends Theatre manager Meetings:   Marland Kitchen Marital Status:     Her Allergies Are:  Allergies  Allergen Reactions  . Aspirin     Sores in mouth  . Adhesive [Tape] Other (See Comments)    SKIN BLISTERS  . Penicillins     Swelling   . Red Yeast Rice Hives  . Sulfa Drugs Cross Reactors Hives  :   Her Current Medications Are:  Outpatient Encounter Medications as of 01/02/2020  Medication Sig  . albuterol (VENTOLIN HFA) 108 (90 Base) MCG/ACT inhaler Inhale 1-2 puffs into the lungs every 6 (six) hours as needed for wheezing or shortness of breath.  . Alirocumab (PRALUENT) 75 MG/ML SOAJ Inject 75 mg into the skin every 14 (fourteen) days.  Marland Kitchen atorvastatin (LIPITOR) 80 MG tablet TAKE ONE TABLET BY MOUTH ONCE DAILY AT 6 PM  . cetirizine (ZYRTEC) 10 MG tablet Take 10 mg by mouth daily.    . clopidogrel (PLAVIX) 75 MG tablet Take 1 tablet (75 mg total) by mouth daily.  . cyclobenzaprine (FLEXERIL) 10 MG tablet TAKE 1 TABLET BY MOUTH AT BEDTIME AS NEEDED AND MAY REPEAT DOSE ONE TIME IF NEEDED FOR MUSCLE SPASMS, CAREFUL OF SEDATION  . Dulaglutide (TRULICITY) 0.34 VQ/2.5ZD SOPN Inject 0.75 mg into the skin once a week.  . fluticasone (FLONASE) 50 MCG/ACT nasal spray Place 2 sprays into both nostrils daily.  . fluticasone (FLOVENT HFA) 110 MCG/ACT inhaler Inhale 2 puffs into the lungs 2 (two) times daily.  . furosemide (LASIX) 20 MG tablet Take 1 tablet (20 mg total) by mouth daily as needed for edema (swelling).  . gabapentin (NEURONTIN) 300 MG capsule TAKE 2 CAPSULES BY MOUTH IN THE MORNING AND 3 CAPSULES  AT BEDTIME  . glimepiride (AMARYL) 4 MG tablet TAKE 1/2 (ONE-HALF) TABLET BY MOUTH TWICE DAILY  . glucose blood test strip Use as instructed  . hydrochlorothiazide (HYDRODIURIL) 25 MG tablet Take 1 tablet (25 mg total) by mouth daily.  Marland Kitchen lidocaine (XYLOCAINE)  2 % solution Use as directed 5-10 mLs in the mouth or throat every 3 (three) hours as needed.  Marland Kitchen lisinopril (ZESTRIL) 40 MG tablet Take 1 tablet (40 mg  total) by mouth daily.  . metFORMIN (GLUCOPHAGE) 500 MG tablet Take 1 tablet (500 mg total) by mouth 2 (two) times daily with a meal.  . metoprolol succinate (TOPROL-XL) 50 MG 24 hr tablet Take 1 tablet (50 mg total) by mouth 2 (two) times daily. Take with or immediately following a meal.  . Multiple Vitamin (MULTIVITAMIN) tablet Take 1 tablet by mouth daily.  Marland Kitchen nystatin cream (MYCOSTATIN) Apply 1 application topically as needed for dry skin.  Marland Kitchen omeprazole (PRILOSEC) 20 MG capsule Take 1 capsule (20 mg total) by mouth daily.  . ondansetron (ZOFRAN-ODT) 8 MG disintegrating tablet DISSOLVE 1 ON TONGUE EVERY 12 HOURS AS NEEDED FOR NAUSEA  . traMADol (ULTRAM) 50 MG tablet Take 1 tablet (50 mg total) by mouth at bedtime as needed.  . triamcinolone cream (KENALOG) 0.1 % Apply 1 application topically as needed.  . citalopram (CELEXA) 20 MG tablet Take 1 tablet (20 mg total) by mouth daily.   No facility-administered encounter medications on file as of 01/02/2020.  :  Review of Systems:  Out of a complete 14 point review of systems, all are reviewed and negative with the exception of these symptoms as listed below: Review of Systems  Constitutional: Negative.   HENT: Negative.   Eyes: Negative.   Cardiovascular:       Leg swelling at times pt is on lasix  Endocrine: Negative.   Genitourinary: Negative.   Musculoskeletal: Positive for neck pain.  Skin: Negative.   Allergic/Immunologic: Negative.   Neurological: Negative.   Hematological: Negative.   Psychiatric/Behavioral: Negative.     Objective:  Neurological Exam  Physical Exam Physical Examination:   Vitals:   01/02/20 1115  BP: 137/81  Pulse: 64  Temp: (!) 97.1 F (36.2 C)    General Examination: The patient is a very pleasant 64 y.o. female in no acute distress. She appears well-developed and well-nourished and well groomed.   HEENT: Normocephalic, atraumatic, pupils are equal, round and reactive to light, extraocular  tracking is good without limitation to gaze excursion or nystagmus noted. Hearing is grossly intact. Face is symmetric with normal facial animation. Speech is clear with no dysarthria noted. There is no hypophonia. There is no lip, neck/head, jaw or voice tremor. Neck with FROM. There are no carotid bruits on auscultation. Oropharynx exam reveals: mild mouth dryness, and moderate airway crowding.  Tongue protrudes centrally in palate elevates symmetrically.  Chest: Clear to auscultation without wheezing, rhonchi or crackles noted.  Heart: S1+S2+0, regular and normal without murmurs, rubs or gallops noted.   Abdomen: Soft, non-tender and non-distended with normal bowel sounds appreciated on auscultation.  Extremities: There is no pitting edema in the distal lower extremities bilaterally. Took Lasix this AM.  Skin: Warm and dry without trophic changes noted.   Musculoskeletal: exam reveals no obvious joint deformities, tenderness or joint swelling or erythema.   Neurologically:  Mental status: The patient is awake, alert and oriented in all 4 spheres. Her immediate and remote memory, attention, language skills and fund of knowledge are appropriate. There is no evidence of aphasia, agnosia, apraxia or anomia. Speech is clear with normal prosody and enunciation. Thought process is linear. Mood is normal and affect is normal.  Cranial nerves II - XII are as described above under HEENT exam.  Motor exam: Normal  bulk, strength and tone is noted. There is no tremor, Romberg is negative. Fine motor skills and coordination: grossly intact.  Cerebellar testing: No dysmetria or intention tremor. There is no truncal or gait ataxia.  Sensory exam: intact to light touch in the upper and lower extremities.  Gait, station and balance: She stands easily. No veering to one side is noted. No leaning to one side is noted. Posture is age-appropriate and stance is narrow based. Gait shows normal stride length  and normal pace. No problems turning are noted.                 Assessment and Plan:   In summary, [name] [name] is a very pleasant 64 year old female with an underlying complex medical history of hypertension, coronary artery disease, status post CABG (2017), history of postoperative A. 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  She has established treatment with AutoPap at home, she has a longstanding history of sleep apnea and had been on CPAP before.  Her diagnostic sleep study from 09/27/2019 confirmed obstructive sleep apnea, overall in the mild range with an AHI of 7/h, O2 nadir of 85% but she did have reduced percentage of REM sleep and absence of supine REM sleep during her study.  She also had moderate PLM's with mild arousals.  She has a longstanding history of restless leg syndrome and leg twitching at night.  While her antidepressant medication citalopram may be a contributor to her PLM's and restless leg symptoms, she has had symptoms for a long time.  She is not keen on adding another medication for her restless legs and overall has been sleeping through the night fairly well.  She is commended for her treatment adherence with her AutoPap.  She is motivated to continue with it.  She is advised to follow-up routinely in 1 year.  Should she have any interim issues with flareup of restless leg symptoms, she is advised to call.  I answered all her questions today and she was in agreement.    I spent 30 minutes in total face-to-face time and in reviewing records during pre-charting, more than 50% of which was spent in counseling and coordination of care, reviewing test results, reviewing medications and treatment regimen  and/or in discussing or reviewing the diagnosis of OSA, RLS, the prognosis and treatment options. Pertinent laboratory and imaging test results that were available during this visit with the patient were reviewed by me and considered in my medical decision making (see chart for details).

## 2020-01-15 ENCOUNTER — Telehealth: Payer: Self-pay

## 2020-01-15 NOTE — Telephone Encounter (Signed)
Pt called asking for e-mail, Pt was informed that we do not  Communicate by e-mail. She understands to use the office fax

## 2020-01-22 ENCOUNTER — Telehealth: Payer: Self-pay | Admitting: Emergency Medicine

## 2020-01-22 NOTE — Telephone Encounter (Signed)
Patient came by office to drop off forms to be filled out by the provider in regards to her medication   Placed in the [place]  For review

## 2020-01-28 DIAGNOSIS — G4733 Obstructive sleep apnea (adult) (pediatric): Secondary | ICD-10-CM | POA: Diagnosis not present

## 2020-01-29 ENCOUNTER — Telehealth: Payer: Self-pay | Admitting: *Deleted

## 2020-01-29 NOTE — Telephone Encounter (Signed)
Faxed completed and signed documented with patient's information attached to [facility] to get Trulicity. Confirmation page 3:24 pm.

## 2020-02-06 ENCOUNTER — Other Ambulatory Visit: Payer: Self-pay | Admitting: Internal Medicine

## 2020-02-06 ENCOUNTER — Other Ambulatory Visit: Payer: Self-pay | Admitting: Emergency Medicine

## 2020-02-06 DIAGNOSIS — G629 Polyneuropathy, unspecified: Secondary | ICD-10-CM

## 2020-02-06 DIAGNOSIS — E1142 Type 2 diabetes mellitus with diabetic polyneuropathy: Secondary | ICD-10-CM

## 2020-02-06 DIAGNOSIS — I2510 Atherosclerotic heart disease of native coronary artery without angina pectoris: Secondary | ICD-10-CM

## 2020-02-06 DIAGNOSIS — F419 Anxiety disorder, unspecified: Secondary | ICD-10-CM

## 2020-02-07 NOTE — Telephone Encounter (Signed)
Requested  medications are  due for refill today yes  Requested medications are on the active medication list yes  Last refill 04/18/2019  Future visit scheduled yes, in July, 2021, last appt was July 2020  Notes to clinic Not Delegated

## 2020-02-13 ENCOUNTER — Other Ambulatory Visit: Payer: Self-pay | Admitting: Emergency Medicine

## 2020-02-13 DIAGNOSIS — I2510 Atherosclerotic heart disease of native coronary artery without angina pectoris: Secondary | ICD-10-CM

## 2020-02-13 DIAGNOSIS — S161XXA Strain of muscle, fascia and tendon at neck level, initial encounter: Secondary | ICD-10-CM

## 2020-02-13 NOTE — Telephone Encounter (Signed)
Requested medication (s) are due for refill today:no  Requested medication (s) are on the active medication list: yes  Last refill: 02/06/20 90 day supply  Future visit scheduled: yes 04/28/20  Notes to clinic:  Meds just refilled Pharmacy states patient requesting year supply. Not sure Dr Mitchel Honour will want that until the OV 04/28/20  Cyclobenzaprine not delegated    Requested Prescriptions  Pending Prescriptions Disp Refills   atorvastatin (LIPITOR) 80 MG tablet [Pharmacy Med Name: ATORVASTATIN  80MG   TAB] 90 tablet 3    Sig: TAKE 1 TABLET BY MOUTH ONCE DAILY AT 6 PM      Cardiovascular:  Antilipid - Statins Failed - 02/13/2020  1:47 PM      Failed - Triglycerides in normal range and within 360 days    Triglycerides  Date Value Ref Range Status  08/29/2019 181 (H) 0 - 149 mg/dL Final          Passed - Total Cholesterol in normal range and within 360 days    Cholesterol, Total  Date Value Ref Range Status  08/29/2019 118 100 - 199 mg/dL Final          Passed - LDL in normal range and within 360 days    LDL Chol Calc (NIH)  Date Value Ref Range Status  08/29/2019 45 0 - 99 mg/dL Final   Direct LDL  Date Value Ref Range Status  04/18/2013 195 (H) mg/dL Final    Comment:    ATP III Classification (LDL):       < 100        mg/dL         Optimal      100 - 129     mg/dL         Near or Above Optimal      130 - 159     mg/dL         Borderline High      160 - 189     mg/dL         High       > 190        mg/dL         Very High     LDL Direct  Date Value Ref Range Status  08/29/2019 54 0 - 99 mg/dL Final          Passed - HDL in normal range and within 360 days    HDL  Date Value Ref Range Status  08/29/2019 43 >39 mg/dL Final          Passed - Patient is not pregnant      Passed - Valid encounter within last 12 months    Recent Outpatient Visits           3 months ago Type 2 diabetes mellitus with diabetic polyneuropathy, without long-term current use of  insulin ([place])   Primary Care at [place], [place], MD   10 months ago Coronary artery disease involving native coronary artery of native heart without angina pectoris   Primary [facility] at [place], [place], MD   1 year ago Acute sore throat   Primary Care at [place], Lanelle Bal, DO   1 year ago Type 2 diabetes mellitus with diabetic polyneuropathy, without long-term current use of insulin [facility])   Primary Care at [facility], Ines Bloomer, MD   1 year ago Sore throat   Primary [facility] at [place], Ranell Patrick, MD  Future Appointments             In 2 months Sagardia, Ines Bloomer, MD Primary Care at [place], [facility]   In 3 months End, Harrell Gave, MD [facility], [facility]              cyclobenzaprine ([place]) 10 MG tablet [Pharmacy Med Name: CYCLOBENZAPRINE  10MG   TAB] 30 tablet 1    Sig: TAKE 1 TABLET BY MOUTH AT  BEDTIME AS NEEDED AND MAY  REPEAT DOSE ONE TIME IF  NEEDED FOR MUSCLE SPASMS  (CAREFUL OF SEDATION)      Not Delegated - Analgesics:  Muscle Relaxants Failed - 02/13/2020  1:47 PM      Failed - This refill cannot be delegated      Passed - Valid encounter within last 6 months    Recent Outpatient Visits           3 months ago Type 2 diabetes mellitus with diabetic polyneuropathy, without long-term current use of insulin ([place])   Primary Care at [place], [place], MD   10 months ago Coronary artery disease involving native coronary artery of native heart without angina pectoris   Primary Care at [place], [place], MD   1 year ago Acute sore throat   Primary Care at [place], Lanelle Bal, DO   1 year ago Type 2 diabetes mellitus with diabetic polyneuropathy, without long-term current use of insulin [facility])   Primary Care at [facility], Ines Bloomer, MD   1 year ago Sore throat   Primary [facility] at [place], Ranell Patrick, MD       Future Appointments             In 2 months Sagardia,  Ines Bloomer, MD Primary [facility] at [place], [facility]   In 3 months End, Harrell Gave, MD [facility], [facility]              lisinopril (ZESTRIL) 40 MG tablet [Pharmacy Med Name: LISINOPRIL  40MG   TAB] 90 tablet 3    Sig: TAKE 1 TABLET BY MOUTH  DAILY      Cardiovascular:  ACE Inhibitors Passed - 02/13/2020  1:47 PM      Passed - Cr in normal range and within 180 days    Creat  Date Value Ref Range Status  07/20/2016 0.92 0.50 - 0.99 mg/dL Final    Comment:      For patients > or = 66 years of age: The upper reference limit for Creatinine is approximately 13% higher for people identified as African-American.      Creatinine, Ser  Date Value Ref Range Status  10/29/2019 0.91 0.57 - 1.00 mg/dL Final          Passed - K in normal range and within 180 days    Potassium  Date Value Ref Range Status  10/29/2019 4.6 3.5 - 5.2 mmol/L Final          Passed - Patient is not pregnant      Passed - Last BP in normal range    BP Readings from Last 1 Encounters:  01/02/20 137/81          Passed - Valid encounter within last 6 months    Recent Outpatient Visits           3 months ago Type 2 diabetes mellitus with diabetic polyneuropathy, without long-term current use of insulin [facility])   [facility] at [facility], Ines Bloomer, MD  10 months ago Coronary artery disease involving native coronary artery of native heart without angina pectoris   Primary Care at [facility], [place], MD   1 year ago Acute sore throat   Primary Care at [place], [employer], DO   1 year ago Type 2 diabetes mellitus with diabetic polyneuropathy, without long-term current use of insulin [facility])   Primary Care at [facility], Ines Bloomer, MD   1 year ago Sore throat   Primary Care at [place], Ranell Patrick, MD       Future Appointments             In 2 months Sagardia, Ines Bloomer, MD Primary Care at [place], [facility]   In 3 months End, Harrell Gave, MD [facility], [place]

## 2020-02-14 ENCOUNTER — Other Ambulatory Visit: Payer: Self-pay

## 2020-02-14 MED ORDER — HYDROCHLOROTHIAZIDE 25 MG PO TABS: 25.0000 mg | ORAL_TABLET | Freq: Every day | ORAL | 1 refills | Status: DC

## 2020-02-19 ENCOUNTER — Other Ambulatory Visit: Payer: Self-pay

## 2020-02-19 MED ORDER — CLOPIDOGREL BISULFATE 75 MG PO TABS: 75.0000 mg | ORAL_TABLET | Freq: Every day | ORAL | 1 refills | Status: DC

## 2020-02-26 MED ORDER — ACCU-CHEK GUIDE W/DEVICE KIT: 1.0000 | PACK | Freq: Three times a day (TID) | 0 refills | Status: DC

## 2020-02-26 NOTE — Addendum Note (Signed)
Addended by: Alfredia Ferguson A on: 02/26/2020 08:54 AM   Modules accepted: Orders

## 2020-02-26 NOTE — Addendum Note (Signed)
Addended by: Alfredia Ferguson A on: 02/26/2020 10:54 AM   Modules accepted: Orders

## 2020-02-27 DIAGNOSIS — G4733 Obstructive sleep apnea (adult) (pediatric): Secondary | ICD-10-CM | POA: Diagnosis not present

## 2020-02-28 ENCOUNTER — Telehealth: Payer: Self-pay | Admitting: *Deleted

## 2020-02-28 NOTE — Telephone Encounter (Signed)
On 02/27/2020, fax refill request for Gabapentin 300 mg to [facility]. The refill is for 1 year and confirmation  At 11:48 pm.

## 2020-03-12 ENCOUNTER — Other Ambulatory Visit: Payer: Self-pay

## 2020-03-14 ENCOUNTER — Telehealth: Payer: Self-pay

## 2020-03-14 ENCOUNTER — Ambulatory Visit (INDEPENDENT_AMBULATORY_CARE_PROVIDER_SITE_OTHER): Payer: Medicare Other | Admitting: *Deleted

## 2020-03-14 DIAGNOSIS — I495 Sick sinus syndrome: Secondary | ICD-10-CM

## 2020-03-14 NOTE — Telephone Encounter (Signed)
Left message for patient to remind of missed remote transmission.  

## 2020-03-17 LAB — CUP PACEART REMOTE DEVICE CHECK
Battery Impedance: 1180 Ohm
Battery Remaining Longevity: 65 mo
Battery Voltage: 2.77 V
Brady Statistic AP VP Percent: 0 %
Brady Statistic AP VS Percent: 0 %
Brady Statistic AS VP Percent: 0 %
Brady Statistic AS VS Percent: 100 %
Date Time Interrogation Session: 20210606134909
Implantable Lead Implant Date: 20091201
Implantable Lead Implant Date: 20091201
Implantable Lead Location: 753859
Implantable Lead Location: 753860
Implantable Lead Model: 5076
Implantable Lead Model: 5076
Implantable Pulse Generator Implant Date: 20091201
Lead Channel Impedance Value: 397 Ohm
Lead Channel Impedance Value: 558 Ohm
Lead Channel Pacing Threshold Amplitude: 0.5 V
Lead Channel Pacing Threshold Amplitude: 0.625 V
Lead Channel Pacing Threshold Pulse Width: 0.4 ms
Lead Channel Pacing Threshold Pulse Width: 0.4 ms
Lead Channel Setting Pacing Amplitude: 2 V
Lead Channel Setting Pacing Amplitude: 2.5 V
Lead Channel Setting Pacing Pulse Width: 0.4 ms
Lead Channel Setting Sensing Sensitivity: 4 mV

## 2020-03-18 ENCOUNTER — Telehealth: Payer: Self-pay | Admitting: *Deleted

## 2020-03-18 NOTE — Telephone Encounter (Signed)
error 

## 2020-03-18 NOTE — Telephone Encounter (Signed)
Re-faxed medical clarification request to [facility] patient has indicated an allergy to Sulfa drugs. Patient is on Glimepiride 4 mg and pharmacy is asking if prescriber is aware. Provider checked I AM AWARE.

## 2020-03-18 NOTE — Telephone Encounter (Signed)
Error

## 2020-03-18 NOTE — Telephone Encounter (Signed)
Entered in error, no documentation.

## 2020-03-19 NOTE — Progress Notes (Signed)
Remote pacemaker transmission.   

## 2020-03-29 DIAGNOSIS — G4733 Obstructive sleep apnea (adult) (pediatric): Secondary | ICD-10-CM | POA: Diagnosis not present

## 2020-04-16 DIAGNOSIS — E119 Type 2 diabetes mellitus without complications: Secondary | ICD-10-CM | POA: Diagnosis not present

## 2020-04-16 DIAGNOSIS — H43813 Vitreous degeneration, bilateral: Secondary | ICD-10-CM | POA: Diagnosis not present

## 2020-04-16 DIAGNOSIS — H26492 Other secondary cataract, left eye: Secondary | ICD-10-CM | POA: Diagnosis not present

## 2020-04-16 DIAGNOSIS — D23112 Other benign neoplasm of skin of right lower eyelid, including canthus: Secondary | ICD-10-CM | POA: Diagnosis not present

## 2020-04-16 LAB — HM DIABETES EYE EXAM

## 2020-04-17 ENCOUNTER — Encounter: Payer: Self-pay | Admitting: *Deleted

## 2020-04-28 ENCOUNTER — Other Ambulatory Visit: Payer: Self-pay

## 2020-04-28 ENCOUNTER — Encounter: Payer: Self-pay | Admitting: Emergency Medicine

## 2020-04-28 ENCOUNTER — Ambulatory Visit (INDEPENDENT_AMBULATORY_CARE_PROVIDER_SITE_OTHER): Payer: Medicare Other | Admitting: Emergency Medicine

## 2020-04-28 VITALS — BP 117/80 | HR 65 | Temp 97.2°F | Ht 62.0 in | Wt 221.0 lb

## 2020-04-28 DIAGNOSIS — I495 Sick sinus syndrome: Secondary | ICD-10-CM | POA: Diagnosis not present

## 2020-04-28 DIAGNOSIS — E1159 Type 2 diabetes mellitus with other circulatory complications: Secondary | ICD-10-CM | POA: Diagnosis not present

## 2020-04-28 DIAGNOSIS — E1169 Type 2 diabetes mellitus with other specified complication: Secondary | ICD-10-CM

## 2020-04-28 DIAGNOSIS — E1142 Type 2 diabetes mellitus with diabetic polyneuropathy: Secondary | ICD-10-CM | POA: Diagnosis not present

## 2020-04-28 DIAGNOSIS — I110 Hypertensive heart disease with heart failure: Secondary | ICD-10-CM

## 2020-04-28 DIAGNOSIS — Z6841 Body Mass Index (BMI) 40.0 and over, adult: Secondary | ICD-10-CM

## 2020-04-28 DIAGNOSIS — G4733 Obstructive sleep apnea (adult) (pediatric): Secondary | ICD-10-CM | POA: Diagnosis not present

## 2020-04-28 DIAGNOSIS — I10 Essential (primary) hypertension: Secondary | ICD-10-CM | POA: Diagnosis not present

## 2020-04-28 DIAGNOSIS — Z950 Presence of cardiac pacemaker: Secondary | ICD-10-CM

## 2020-04-28 DIAGNOSIS — I152 Hypertension secondary to endocrine disorders: Secondary | ICD-10-CM

## 2020-04-28 DIAGNOSIS — I5032 Chronic diastolic (congestive) heart failure: Secondary | ICD-10-CM | POA: Diagnosis not present

## 2020-04-28 DIAGNOSIS — Z13820 Encounter for screening for osteoporosis: Secondary | ICD-10-CM

## 2020-04-28 DIAGNOSIS — E785 Hyperlipidemia, unspecified: Secondary | ICD-10-CM

## 2020-04-28 LAB — POCT GLYCOSYLATED HEMOGLOBIN (HGB A1C): Hemoglobin A1C: 6.2 % — AB (ref 4.0–5.6)

## 2020-04-28 LAB — GLUCOSE, POCT (MANUAL RESULT ENTRY): POC Glucose: 127 mg/dl — AB (ref 70–99)

## 2020-04-28 MED ORDER — NYSTATIN 100000 UNIT/GM EX CREA: 1.0000 "application " | TOPICAL_CREAM | CUTANEOUS | 1 refills | Status: AC | PRN

## 2020-04-28 NOTE — Assessment & Plan Note (Signed)
Well-controlled hypertension.  Continue present medications.  No changes. Much improved diabetes with hemoglobin A1c down to 6.2.  Continue present medications.  No changes.  Diet and nutrition discussed. Follow-up in 6 months.

## 2020-04-28 NOTE — Progress Notes (Signed)
Ermelinda Berns 64 y.o.   Chief Complaint  Patient presents with  . Diabetes  . Hyperlipidemia    HISTORY OF PRESENT ILLNESS: This is a 64 y.o. female with multiple chronic medical problems including diabetes, hypertensive heart disease with chronic congestive heart failure, dyslipidemia, sleep apnea, status post pacemaker insertion here for follow-up for. Feels great and has no complaints or medical concerns today.  Doing well. Wt Readings from Last 3 Encounters:  04/28/20 221 lb (100.2 kg)  01/02/20 223 lb 8 oz (101.4 kg)  11/29/19 217 lb (98.4 kg)   BP Readings from Last 3 Encounters:  04/28/20 117/80  01/02/20 137/81  11/29/19 118/87   Lab Results  Component Value Date   CHOL 118 08/29/2019   HDL 43 08/29/2019   LDLCALC 45 08/29/2019   LDLDIRECT 54 08/29/2019   TRIG 181 (H) 08/29/2019   CHOLHDL 2.7 08/29/2019    Lab Results  Component Value Date   HGBA1C 8.0 (H) 10/29/2019    HPI   Prior to Admission medications   Medication Sig Start Date End Date Taking? Authorizing Provider  albuterol (VENTOLIN HFA) 108 (90 Base) MCG/ACT inhaler Inhale 1-2 puffs into the lungs every 6 (six) hours as needed for wheezing or shortness of breath. 04/17/19  Yes Strider Vallance Jose, MD  Alirocumab (PRALUENT) 75 MG/ML SOAJ Inject 75 mg into the skin every 14 (fourteen) days. 11/01/19  Yes End, Christopher, MD  atorvastatin (LIPITOR) 80 MG tablet TAKE 1 TABLET BY MOUTH ONCE DAILY AT 6 PM 02/14/20  Yes Tysheem Accardo Jose, MD  Blood Glucose Monitoring Suppl (ACCU-CHEK GUIDE) w/Device KIT 1 Device by Does not apply route in the morning, at noon, and at bedtime. 02/26/20  Yes Yarieliz Wasser Jose, MD  Calcium Polycarbophil (FIBER-CAPS PO)  11/29/18  Yes [provider]  cetirizine (ZYRTEC) 10 MG tablet Take 10 mg by mouth daily.     Yes [provider]  citalopram (CELEXA) 20 MG tablet TAKE 1 TABLET BY MOUTH  DAILY 02/09/20  Yes Dekker Verga Jose, MD  clopidogrel (PLAVIX)  75 MG tablet Take 1 tablet (75 mg total) by mouth daily. 02/19/20  Yes End, Christopher, MD  cyclobenzaprine (FLEXERIL) 10 MG tablet TAKE 1 TABLET BY MOUTH AT  BEDTIME AS NEEDED AND MAY  REPEAT DOSE ONE TIME IF  NEEDED FOR MUSCLE SPASMS  (CAREFUL OF SEDATION) 02/14/20  Yes Stuart Guillen Jose, MD  Dulaglutide (TRULICITY) 0.75 MG/0.5ML SOPN Inject 0.75 mg into the skin once a week. 10/30/19  Yes Torra Pala Jose, MD  fluticasone (FLONASE) 50 MCG/ACT nasal spray Place 2 sprays into both nostrils daily. 04/17/19  Yes Maki Hege Jose, MD  fluticasone (FLOVENT HFA) 110 MCG/ACT inhaler Inhale 2 puffs into the lungs 2 (two) times daily. 04/17/19  Yes Megumi Treaster Jose, MD  furosemide (LASIX) 20 MG tablet TAKE 1 TABLET BY MOUTH  DAILY AS NEEDED FOR EDEMA  (SWELLING) 02/07/20  Yes End, Christopher, MD  gabapentin (NEURONTIN) 300 MG capsule TAKE 2 CAPSULES BY MOUTH IN THE MORNING AND 3 CAPSULES  AT BEDTIME 06/06/19  Yes Casi Westerfeld Jose, MD  glimepiride (AMARYL) 4 MG tablet TAKE ONE-HALF TABLET BY  MOUTH TWICE DAILY 02/09/20  Yes Pattijo Juste Jose, MD  glucose blood test strip Use as instructed 10/29/19  Yes Fredi Geiler Jose, MD  hydrochlorothiazide (HYDRODIURIL) 25 MG tablet Take 1 tablet (25 mg total) by mouth daily. 02/14/20  Yes End, Christopher, MD  lidocaine (XYLOCAINE) 2 % solution Use as directed 5-10 mLs in   the mouth or throat every 3 (three) hours as needed. 04/17/19  Yes Horald Pollen, MD  lisinopril (ZESTRIL) 40 MG tablet TAKE 1 TABLET BY MOUTH  DAILY 02/14/20  Yes Delvin Hedeen, Ines Bloomer, MD  metoprolol succinate (TOPROL-XL) 50 MG 24 hr tablet TAKE 1 TABLET BY MOUTH  TWICE DAILY WITH OR  IMMEDIATELY FOLLOWING A  MEAL 02/09/20  Yes Dreden Rivere, Ines Bloomer, MD  Multiple Vitamin (MULTIVITAMIN) tablet Take 1 tablet by mouth daily. 08/30/16  Yes Lars Pinks M, PA-C  nystatin cream (MYCOSTATIN) Apply 1 application topically as needed for dry skin.   Yes [provider]    omeprazole (PRILOSEC) 20 MG capsule Take 1 capsule (20 mg total) by mouth daily. 12/15/16  Yes English, Colletta [place] D, PA  ondansetron (ZOFRAN-ODT) 8 MG disintegrating tablet DISSOLVE 1 ON TONGUE EVERY 12 HOURS AS NEEDED FOR NAUSEA 04/17/19  Yes Heaton Sarin, Ines Bloomer, MD  traMADol (ULTRAM) 50 MG tablet TAKE 1 TABLET BY MOUTH AT  BEDTIME AS NEEDED 02/09/20  Yes Jaquel Glassburn, Ines Bloomer, MD  triamcinolone cream (KENALOG) 0.1 % Apply 1 application topically as needed. 04/17/19  Yes Horald Pollen, MD  metFORMIN (GLUCOPHAGE) 500 MG tablet Take 1 tablet (500 mg total) by mouth 2 (two) times daily with a meal. 10/30/19 01/28/20  Horald Pollen, MD    Allergies  Allergen Reactions  . Aspirin     Sores in mouth  . Adhesive [Tape] Other (See Comments)    SKIN BLISTERS  . Penicillins     Swelling   . Red Yeast Rice Hives  . Sulfa Drugs Cross Reactors Hives    Patient Active Problem List   Diagnosis Date Noted  . Trigger finger, right middle finger 11/07/2019  . Hypertension associated with diabetes ([place]) 10/29/2019  . Chronic heart failure with preserved ejection fraction (HFpEF) ([place]) 05/25/2019  . Coronary artery disease involving native coronary artery of native heart without angina pectoris 02/14/2018  . Morbid obesity with body mass index (BMI) of 40.0 to 44.9 in adult ([place]) 02/14/2018  . Irritable bowel syndrome 06/28/2017  . S/P CABG x 3 07/22/2016  . Multiple vessel coronary artery disease 07/21/2016  . STEMI involving left circumflex coronary artery ([place]) 07/20/2016  . Asthmatic bronchitis 04/26/2016  . Allergic urticaria 04/26/2016  . Environmental allergies 04/26/2016  . Anxiety 04/26/2016  . Sinus node dysfunction ([place]) 04/26/2016  . Pacemaker Medtronic   . Ventricular tachycardia, nonsustained-during exercise testing 03/07/2013  . OSA (obstructive sleep apnea) 05/25/2011  . Hx of adenomatous colonic polyps 05/25/2011  . Migraine 05/25/2011  . Diabetes mellitus type 2,  uncontrolled, with complications ([place]) 36/14/4315  . GERD (gastroesophageal reflux disease) 05/25/2011  . NAFLD (nonalcoholic fatty liver disease) 05/25/2011  . Hyperlipidemia LDL goal <70 05/25/2011  . Essential hypertension 09/16/2009  . DIASTOLIC HEART FAILURE, CHRONIC 09/16/2009    Past Medical History:  Diagnosis Date  . Allergy   . Anal fissure   . Anxiety   . Arthritis   . Asthma   . Cataract 2010    left  . Colon polyps    diverticulosis  03-2011/2005  . Depression   . Diabetes mellitus 2012   T2DM  . Diverticulosis   . Fatty liver 04/2011  . GERD (gastroesophageal reflux disease)   . Hyperlipidemia   . Hypertension severe   . Migraine   . Obesity   . OSA (obstructive sleep apnea)    C-Pap  . Pacemaker Medtronic   . Sinoatrial node dysfunction (HCC)  syncope    Past Surgical History:  Procedure Laterality Date  . ABDOMINAL HYSTERECTOMY  2000   total  . CARDIAC CATHETERIZATION N/A 07/20/2016   Procedure: Left Heart Cath and Coronary Angiography;  Surgeon: Peter M Martinique, MD;  Location: [facility];  Service: Cardiovascular;  Laterality: N/A;  . CATARACT EXTRACTION Left   . CORONARY ARTERY BYPASS GRAFT N/A 07/22/2016   Procedure: CORONARY ARTERY BYPASS GRAFTING (CABG) x three , using left internal mammary artery and right leg greater saphenous vein harvested endoscopically;  Surgeon: Ivin Poot, MD;  Location: [facility];  Service: Open Heart Surgery;  Laterality: N/A;  . EYE SURGERY Right 2010  . OOPHORECTOMY    . PACEMAKER PLACEMENT    . T/A right knee    . TEE WITHOUT CARDIOVERSION N/A 07/22/2016   Procedure: TRANSESOPHAGEAL ECHOCARDIOGRAM (TEE);  Surgeon: Ivin Poot, MD;  Location: [facility];  Service: Open Heart Surgery;  Laterality: N/A;  . TONSILLECTOMY      Social History   Socioeconomic History  . Marital status: Single    Spouse name: Not on file  . Number of children: 0  . Years of education: Not on file  . Highest education level:  Not on file  Occupational History  . Occupation: Surveyor, quantity: [employer] [place]  Tobacco Use  . Smoking status: Former Smoker    Types: Cigarettes    Quit date: 1995    Years since quitting: 26.5  . Smokeless tobacco: Never Used  . Tobacco comment: Quit 20 yrs ago  Vaping Use  . Vaping Use: Never used  Substance and Sexual Activity  . Alcohol use: No    Alcohol/week: 0.0 standard drinks  . Drug use: No  . Sexual activity: Yes    Birth control/protection: Post-menopausal, Other-see comments    Comment: Monogamous   Other Topics Concern  . Not on file  Social History Narrative   Single. Exercise: No. Education: College.   Social Determinants of Health   Financial Resource Strain:   . Difficulty of Paying Living Expenses:   Food Insecurity:   . Worried About Charity fundraiser in the Last Year:   . Arboriculturist in the Last Year:   Transportation Needs:   . Film/video editor (Medical):   Marland Kitchen Lack of Transportation (Non-Medical):   Physical Activity:   . Days of Exercise per Week:   . Minutes of Exercise per Session:   Stress:   . Feeling of Stress :   Social Connections:   . Frequency of Communication with Friends and Family:   . Frequency of Social Gatherings with Friends and Family:   . Attends Religious Services:   . Active Member of Clubs or Organizations:   . Attends Archivist Meetings:   Marland Kitchen Marital Status:   Intimate Partner Violence:   . Fear of Current or Ex-Partner:   . Emotionally Abused:   Marland Kitchen Physically Abused:   . Sexually Abused:     Family History  Problem Relation Age of Onset  . Heart disease Mother   . Cancer Mother 52       lung  . Breast cancer Mother        maternal grandmother  . Diabetes Sister        type 2  . Breast cancer Brother   . Cancer Maternal Grandmother        breast  . Leukemia Paternal Grandmother  neoplast  . Aneurysm Paternal Grandfather        abdominal (stomach)  . Heart disease  Father   . Colon cancer Neg Hx      Review of Systems  Constitutional: Negative.  Negative for chills and fever.  HENT: Negative.  Negative for congestion and sore throat.   Respiratory: Negative.  Negative for cough and shortness of breath.   Cardiovascular: Negative.  Negative for chest pain and palpitations.  Gastrointestinal: Negative.  Negative for abdominal pain, diarrhea, nausea and vomiting.  Genitourinary: Negative.  Negative for dysuria and hematuria.  Musculoskeletal: Negative.  Negative for myalgias and neck pain.  Skin: Negative.  Negative for rash.  Neurological: Negative.  Negative for dizziness and headaches.  Endo/Heme/Allergies: Negative.   All other systems reviewed and are negative.    Today's Vitals   04/28/20 0833  BP: 117/80  Pulse: 65  Temp: (!) 97.2 F (36.2 C)  SpO2: 96%  Weight: 221 lb (100.2 kg)  Height: 5' 2" (1.575 m)   Body mass index is 40.42 kg/m.   Physical Exam Vitals reviewed.  Constitutional:      Appearance: Normal appearance.  HENT:     Head: Normocephalic.     Mouth/Throat:     Mouth: Mucous membranes are moist.     Pharynx: Oropharynx is clear.  Eyes:     Extraocular Movements: Extraocular movements intact.     Conjunctiva/sclera: Conjunctivae normal.     Pupils: Pupils are equal, round, and reactive to light.  Cardiovascular:     Rate and Rhythm: Normal rate and regular rhythm.     Pulses: Normal pulses.     Heart sounds: Normal heart sounds.  Pulmonary:     Effort: Pulmonary effort is normal.     Breath sounds: Normal breath sounds.  Musculoskeletal:        General: Normal range of motion.     Cervical back: Normal range of motion and neck supple.     Comments: Lower extremities: Warm to touch.  No erythema or swelling.  Excellent distal peripheral pulses and capillary refill.  Good sensation.  Skin:    General: Skin is warm and dry.     Capillary Refill: Capillary refill takes less than 2 seconds.  Neurological:       General: No focal deficit present.     Mental Status: She is alert and oriented to person, place, and time.  Psychiatric:        Mood and Affect: Mood normal.        Behavior: Behavior normal.     Results for orders placed or performed in visit on 04/28/20 (from the past 24 hour(s))  POCT glucose (manual entry)     Status: Abnormal   Collection Time: 04/28/20  9:29 AM  Result Value Ref Range   POC Glucose 127 (A) 70 - 99 mg/dl  POCT glycosylated hemoglobin (Hb A1C)     Status: Abnormal   Collection Time: 04/28/20  9:31 AM  Result Value Ref Range   Hemoglobin A1C 6.2 (A) 4.0 - 5.6 %   HbA1c POC (<> result, manual entry)     HbA1c, POC (prediabetic range)     HbA1c, POC (controlled diabetic range)     A total of 45 minutes was spent with the patient, greater than 50% of which was in counseling/coordination of care regarding multiple chronic medical problems, management and cardiovascular risks associated with these conditions, review of most recent office visit notes, review of most recent  blood work results including today's hemoglobin A1c, review of all past medical problems, diet and nutrition, review of all medications, prognosis and need for follow-up in 6 months.  ASSESSMENT & PLAN: Hypertension associated with diabetes (HCC) Well-controlled hypertension.  Continue present medications.  No changes. Much improved diabetes with hemoglobin A1c down to 6.2.  Continue present medications.  No changes.  Diet and nutrition discussed. Follow-up in 6 months.  [place] was seen today for diabetes and hyperlipidemia.  Diagnoses and all orders for this visit:  Type 2 diabetes mellitus with diabetic polyneuropathy, without long-term current use of insulin (HCC) -     HM Diabetes Foot Exam  Essential hypertension  Hypertension associated with diabetes (HCC) -     Cancel: Hemoglobin A1c -     Lipid panel -     CMP14+EGFR -     POCT glucose (manual entry) -     POCT glycosylated  hemoglobin (Hb A1C)  Chronic heart failure with preserved ejection fraction (HFpEF) (HCC)  Sinus node dysfunction (HCC)  Hypertensive heart disease with heart failure (HCC)  Body mass index (BMI) of 40.1-44.9 in adult (HCC)  Dyslipidemia  Dyslipidemia associated with type 2 diabetes mellitus (HCC)  Cardiac pacemaker in situ  Osteoporosis screening -     DG Bone Density; Future  Other orders -     nystatin cream (MYCOSTATIN); Apply 1 application topically as needed for dry skin.    Patient Instructions    Calorie Counting for Weight Loss Calories are units of energy. Your body needs a certain amount of calories from food to keep you going throughout the day. When you eat more calories than your body needs, your body stores the extra calories as fat. When you eat fewer calories than your body needs, your body burns fat to get the energy it needs. Calorie counting means keeping track of how many calories you eat and drink each day. Calorie counting can be helpful if you need to lose weight. If you make sure to eat fewer calories than your body needs, you should lose weight. Ask your health care provider what a healthy weight is for you. For calorie counting to work, you will need to eat the right number of calories in a day in order to lose a healthy amount of weight per week. A dietitian can help you determine how many calories you need in a day and will give you suggestions on how to reach your calorie goal.  A healthy amount of weight to lose per week is usually 1-2 lb (0.5-0.9 kg). This usually means that your daily calorie intake should be reduced by 500-750 calories.  Eating 1,200 - 1,500 calories per day can help most women lose weight.  Eating 1,500 - 1,800 calories per day can help most men lose weight. What is my plan? My goal is to have __________ calories per day. If I have this many calories per day, I should lose around __________ pounds per week. What do I need to  know about calorie counting? In order to meet your daily calorie goal, you will need to:  Find out how many calories are in each food you would like to eat. Try to do this before you eat.  Decide how much of the food you plan to eat.  Write down what you ate and how many calories it had. Doing this is called keeping a food log. To successfully lose weight, it is important to balance calorie counting with a healthy lifestyle   that includes regular activity. Aim for 150 minutes of moderate exercise (such as walking) or 75 minutes of vigorous exercise (such as running) each week. Where do I find calorie information?  The number of calories in a food can be found on a Nutrition Facts label. If a food does not have a Nutrition Facts label, try to look up the calories online or ask your dietitian for help. Remember that calories are listed per serving. If you choose to have more than one serving of a food, you will have to multiply the calories per serving by the amount of servings you plan to eat. For example, the label on a package of bread might say that a serving size is 1 slice and that there are 90 calories in a serving. If you eat 1 slice, you will have eaten 90 calories. If you eat 2 slices, you will have eaten 180 calories. How do I keep a food log? Immediately after each meal, record the following information in your food log:  What you ate. Don't forget to include toppings, sauces, and other extras on the food.  How much you ate. This can be measured in cups, ounces, or number of items.  How many calories each food and drink had.  The total number of calories in the meal. Keep your food log near you, such as in a small notebook in your pocket, or use a mobile app or website. Some programs will calculate calories for you and show you how many calories you have left for the day to meet your goal. What are some calorie counting tips?   Use your calories on foods and drinks that will fill  you up and not leave you hungry: ? Some examples of foods that fill you up are nuts and nut butters, vegetables, lean proteins, and high-fiber foods like whole grains. High-fiber foods are foods with more than 5 g fiber per serving. ? Drinks such as sodas, specialty coffee drinks, alcohol, and juices have a lot of calories, yet do not fill you up.  Eat nutritious foods and avoid empty calories. Empty calories are calories you get from foods or beverages that do not have many vitamins or protein, such as candy, sweets, and soda. It is better to have a nutritious high-calorie food (such as an avocado) than a food with few nutrients (such as a bag of chips).  Know how many calories are in the foods you eat most often. This will help you calculate calorie counts faster.  Pay attention to calories in drinks. Low-calorie drinks include water and unsweetened drinks.  Pay attention to nutrition labels for "low fat" or "fat free" foods. These foods sometimes have the same amount of calories or more calories than the full fat versions. They also often have added sugar, starch, or salt, to make up for flavor that was removed with the fat.  Find a way of tracking calories that works for you. Get creative. Try different apps or programs if writing down calories does not work for you. What are some portion control tips?  Know how many calories are in a serving. This will help you know how many servings of a certain food you can have.  Use a measuring cup to measure serving sizes. You could also try weighing out portions on a kitchen scale. With time, you will be able to estimate serving sizes for some foods.  Take some time to put servings of different foods on your favorite plates, bowls,  and cups so you know what a serving looks like.  Try not to eat straight from a bag or box. Doing this can lead to overeating. Put the amount you would like to eat in a cup or on a plate to make sure you are eating the  right portion.  Use smaller plates, glasses, and bowls to prevent overeating.  Try not to multitask (for example, watch TV or use your computer) while eating. If it is time to eat, sit down at a table and enjoy your food. This will help you to know when you are full. It will also help you to be aware of what you are eating and how much you are eating. What are tips for following this plan? Reading food labels  Check the calorie count compared to the serving size. The serving size may be smaller than what you are used to eating.  Check the source of the calories. Make sure the food you are eating is high in vitamins and protein and low in saturated and trans fats. Shopping  Read nutrition labels while you shop. This will help you make healthy decisions before you decide to purchase your food.  Make a grocery list and stick to it. Cooking  Try to cook your favorite foods in a healthier way. For example, try baking instead of frying.  Use low-fat dairy products. Meal planning  Use more fruits and vegetables. Half of your plate should be fruits and vegetables.  Include lean proteins like poultry and fish. How do I count calories when eating out?  Ask for smaller portion sizes.  Consider sharing an entree and sides instead of getting your own entree.  If you get your own entree, eat only half. Ask for a box at the beginning of your meal and put the rest of your entree in it so you are not tempted to eat it.  If calories are listed on the menu, choose the lower calorie options.  Choose dishes that include vegetables, fruits, whole grains, low-fat dairy products, and lean protein.  Choose items that are boiled, broiled, grilled, or steamed. Stay away from items that are buttered, battered, fried, or served with cream sauce. Items labeled "crispy" are usually fried, unless stated otherwise.  Choose water, low-fat milk, unsweetened iced tea, or other drinks without added sugar. If you  want an alcoholic beverage, choose a lower calorie option such as a glass of wine or light beer.  Ask for dressings, sauces, and syrups on the side. These are usually high in calories, so you should limit the amount you eat.  If you want a salad, choose a garden salad and ask for grilled meats. Avoid extra toppings like bacon, cheese, or fried items. Ask for the dressing on the side, or ask for olive oil and vinegar or lemon to use as dressing.  Estimate how many servings of a food you are given. For example, a serving of cooked rice is  cup or about the size of half a baseball. Knowing serving sizes will help you be aware of how much food you are eating at restaurants. The list below tells you how big or small some common portion sizes are based on everyday objects: ? 1 oz--4 stacked dice. ? 3 oz--1 deck of cards. ? 1 tsp--1 die. ? 1 Tbsp-- a ping-pong ball. ? 2 Tbsp--1 ping-pong ball. ?  cup-- baseball. ? 1 cup--1 baseball. Summary  Calorie counting means keeping track of how many calories you eat   and drink each day. If you eat fewer calories than your body needs, you should lose weight.  A healthy amount of weight to lose per week is usually 1-2 lb (0.5-0.9 kg). This usually means reducing your daily calorie intake by 500-750 calories.  The number of calories in a food can be found on a Nutrition Facts label. If a food does not have a Nutrition Facts label, try to look up the calories online or ask your dietitian for help.  Use your calories on foods and drinks that will fill you up, and not on foods and drinks that will leave you hungry.  Use smaller plates, glasses, and bowls to prevent overeating. This information is not intended to replace advice given to you by your health care provider. Make sure you discuss any questions you have with your health care provider. Document Revised: 06/16/2018 Document Reviewed: 08/27/2016 Elsevier Patient Education  2020 Elsevier  Inc.  Diabetes Mellitus and Nutrition, Adult When you have diabetes (diabetes mellitus), it is very important to have healthy eating habits because your blood sugar (glucose) levels are greatly affected by what you eat and drink. Eating healthy foods in the appropriate amounts, at about the same times every day, can help you:  Control your blood glucose.  Lower your risk of heart disease.  Improve your blood pressure.  Reach or maintain a healthy weight. Every person with diabetes is different, and each person has different needs for a meal plan. Your health care provider may recommend that you work with a diet and nutrition specialist (dietitian) to make a meal plan that is best for you. Your meal plan may vary depending on factors such as:  The calories you need.  The medicines you take.  Your weight.  Your blood glucose, blood pressure, and cholesterol levels.  Your activity level.  Other health conditions you have, such as heart or kidney disease. How do carbohydrates affect me? Carbohydrates, also called carbs, affect your blood glucose level more than any other type of food. Eating carbs naturally raises the amount of glucose in your blood. Carb counting is a method for keeping track of how many carbs you eat. Counting carbs is important to keep your blood glucose at a healthy level, especially if you use insulin or take certain oral diabetes medicines. It is important to know how many carbs you can safely have in each meal. This is different for every person. Your dietitian can help you calculate how many carbs you should have at each meal and for each snack. Foods that contain carbs include:  Bread, cereal, rice, pasta, and crackers.  Potatoes and corn.  Peas, beans, and lentils.  Milk and yogurt.  Fruit and juice.  Desserts, such as cakes, cookies, ice cream, and candy. How does alcohol affect me? Alcohol can cause a sudden decrease in blood glucose (hypoglycemia),  especially if you use insulin or take certain oral diabetes medicines. Hypoglycemia can be a life-threatening condition. Symptoms of hypoglycemia (sleepiness, dizziness, and confusion) are similar to symptoms of having too much alcohol. If your health care provider says that alcohol is safe for you, follow these guidelines:  Limit alcohol intake to no more than 1 drink per day for nonpregnant women and 2 drinks per day for men. One drink equals 12 oz of beer, 5 oz of wine, or 1 oz of hard liquor.  Do not drink on an empty stomach.  Keep yourself hydrated with water, diet soda, or unsweetened iced tea.    Keep in mind that regular soda, juice, and other mixers may contain a lot of sugar and must be counted as carbs. What are tips for following this plan?  Reading food labels  Start by checking the serving size on the "Nutrition Facts" label of packaged foods and drinks. The amount of calories, carbs, fats, and other nutrients listed on the label is based on one serving of the item. Many items contain more than one serving per package.  Check the total grams (g) of carbs in one serving. You can calculate the number of servings of carbs in one serving by dividing the total carbs by 15. For example, if a food has 30 g of total carbs, it would be equal to 2 servings of carbs.  Check the number of grams (g) of saturated and trans fats in one serving. Choose foods that have low or no amount of these fats.  Check the number of milligrams (mg) of salt (sodium) in one serving. Most people should limit total sodium intake to less than 2,300 mg per day.  Always check the nutrition information of foods labeled as "low-fat" or "nonfat". These foods may be higher in added sugar or refined carbs and should be avoided.  Talk to your dietitian to identify your daily goals for nutrients listed on the label. Shopping  Avoid buying canned, premade, or processed foods. These foods tend to be high in fat, sodium,  and added sugar.  Shop around the outside edge of the grocery store. This includes fresh fruits and vegetables, bulk grains, fresh meats, and fresh dairy. Cooking  Use low-heat cooking methods, such as baking, instead of high-heat cooking methods like deep frying.  Cook using healthy oils, such as olive, canola, or sunflower oil.  Avoid cooking with butter, cream, or high-fat meats. Meal planning  Eat meals and snacks regularly, preferably at the same times every day. Avoid going long periods of time without eating.  Eat foods high in fiber, such as fresh fruits, vegetables, beans, and whole grains. Talk to your dietitian about how many servings of carbs you can eat at each meal.  Eat 4-6 ounces (oz) of lean protein each day, such as lean meat, chicken, fish, eggs, or tofu. One oz of lean protein is equal to: ? 1 oz of meat, chicken, or fish. ? 1 egg. ?  cup of tofu.  Eat some foods each day that contain healthy fats, such as avocado, nuts, seeds, and fish. Lifestyle  Check your blood glucose regularly.  Exercise regularly as told by your health care provider. This may include: ? 150 minutes of moderate-intensity or vigorous-intensity exercise each week. This could be brisk walking, biking, or water aerobics. ? Stretching and doing strength exercises, such as yoga or weightlifting, at least 2 times a week.  Take medicines as told by your health care provider.  Do not use any products that contain nicotine or tobacco, such as cigarettes and e-cigarettes. If you need help quitting, ask your health care provider.  Work with a counselor or diabetes educator to identify strategies to manage stress and any emotional and social challenges. Questions to ask a health care provider  Do I need to meet with a diabetes educator?  Do I need to meet with a dietitian?  What number can I call if I have questions?  When are the best times to check my blood glucose? Where to find more  information:  American Diabetes Association: diabetes.org  Academy of Nutrition and Dietetics:   www.eatright.[employer] of Diabetes and Digestive and Kidney Diseases (NIH): [employer] Summary  A healthy meal plan will help you control your blood glucose and maintain a healthy lifestyle.  Working with a diet and nutrition specialist (dietitian) can help you make a meal plan that is best for you.  Keep in mind that carbohydrates (carbs) and alcohol have immediate effects on your blood glucose levels. It is important to count carbs and to use alcohol carefully. This information is not intended to replace advice given to you by your health care provider. Make sure you discuss any questions you have with your health care provider. Document Revised: 09/09/2017 Document Reviewed: 11/01/2016 Elsevier Patient Education  2020 Elsevier Inc.      Agustina Caroli, MD [facility]

## 2020-04-28 NOTE — Patient Instructions (Addendum)
Calorie Counting for Weight Loss Calories are units of energy. Your body needs a certain amount of calories from food to keep you going throughout the day. When you eat more calories than your body needs, your body stores the extra calories as fat. When you eat fewer calories than your body needs, your body burns fat to get the energy it needs. Calorie counting means keeping track of how many calories you eat and drink each day. Calorie counting can be helpful if you need to lose weight. If you make sure to eat fewer calories than your body needs, you should lose weight. Ask your health care provider what a healthy weight is for you. For calorie counting to work, you will need to eat the right number of calories in a day in order to lose a healthy amount of weight per week. A dietitian can help you determine how many calories you need in a day and will give you suggestions on how to reach your calorie goal.  A healthy amount of weight to lose per week is usually 1-2 lb (0.5-0.9 kg). This usually means that your daily calorie intake should be reduced by 500-750 calories.  Eating 1,200 - 1,500 calories per day can help most women lose weight.  Eating 1,500 - 1,800 calories per day can help most men lose weight. What is my plan? My goal is to have __________ calories per day. If I have this many calories per day, I should lose around __________ pounds per week. What do I need to know about calorie counting? In order to meet your daily calorie goal, you will need to:  Find out how many calories are in each food you would like to eat. Try to do this before you eat.  Decide how much of the food you plan to eat.  Write down what you ate and how many calories it had. Doing this is called keeping a food log. To successfully lose weight, it is important to balance calorie counting with a healthy lifestyle that includes regular activity. Aim for 150 minutes of moderate exercise (such as walking) or 75  minutes of vigorous exercise (such as running) each week. Where do I find calorie information?  The number of calories in a food can be found on a Nutrition Facts label. If a food does not have a Nutrition Facts label, try to look up the calories online or ask your dietitian for help. Remember that calories are listed per serving. If you choose to have more than one serving of a food, you will have to multiply the calories per serving by the amount of servings you plan to eat. For example, the label on a package of bread might say that a serving size is 1 slice and that there are 90 calories in a serving. If you eat 1 slice, you will have eaten 90 calories. If you eat 2 slices, you will have eaten 180 calories. How do I keep a food log? Immediately after each meal, record the following information in your food log:  What you ate. Don't forget to include toppings, sauces, and other extras on the food.  How much you ate. This can be measured in cups, ounces, or number of items.  How many calories each food and drink had.  The total number of calories in the meal. Keep your food log near you, such as in a small notebook in your pocket, or use a mobile app or website. Some programs will  calculate calories for you and show you how many calories you have left for the day to meet your goal. What are some calorie counting tips?   Use your calories on foods and drinks that will fill you up and not leave you hungry: ? Some examples of foods that fill you up are nuts and nut butters, vegetables, lean proteins, and high-fiber foods like whole grains. High-fiber foods are foods with more than 5 g fiber per serving. ? Drinks such as sodas, specialty coffee drinks, alcohol, and juices have a lot of calories, yet do not fill you up.  Eat nutritious foods and avoid empty calories. Empty calories are calories you get from foods or beverages that do not have many vitamins or protein, such as candy, sweets, and  soda. It is better to have a nutritious high-calorie food (such as an avocado) than a food with few nutrients (such as a bag of chips).  Know how many calories are in the foods you eat most often. This will help you calculate calorie counts faster.  Pay attention to calories in drinks. Low-calorie drinks include water and unsweetened drinks.  Pay attention to nutrition labels for "low fat" or "fat free" foods. These foods sometimes have the same amount of calories or more calories than the full fat versions. They also often have added sugar, starch, or salt, to make up for flavor that was removed with the fat.  Find a way of tracking calories that works for you. Get creative. Try different apps or programs if writing down calories does not work for you. What are some portion control tips?  Know how many calories are in a serving. This will help you know how many servings of a certain food you can have.  Use a measuring cup to measure serving sizes. You could also try weighing out portions on a kitchen scale. With time, you will be able to estimate serving sizes for some foods.  Take some time to put servings of different foods on your favorite plates, bowls, and cups so you know what a serving looks like.  Try not to eat straight from a bag or box. Doing this can lead to overeating. Put the amount you would like to eat in a cup or on a plate to make sure you are eating the right portion.  Use smaller plates, glasses, and bowls to prevent overeating.  Try not to multitask (for example, watch TV or use your computer) while eating. If it is time to eat, sit down at a table and enjoy your food. This will help you to know when you are full. It will also help you to be aware of what you are eating and how much you are eating. What are tips for following this plan? Reading food labels  Check the calorie count compared to the serving size. The serving size may be smaller than what you are used to  eating.  Check the source of the calories. Make sure the food you are eating is high in vitamins and protein and low in saturated and trans fats. Shopping  Read nutrition labels while you shop. This will help you make healthy decisions before you decide to purchase your food.  Make a grocery list and stick to it. Cooking  Try to cook your favorite foods in a healthier way. For example, try baking instead of frying.  Use low-fat dairy products. Meal planning  Use more fruits and vegetables. Half of your plate should be  fruits and vegetables.  Include lean proteins like poultry and fish. How do I count calories when eating out?  Ask for smaller portion sizes.  Consider sharing an entree and sides instead of getting your own entree.  If you get your own entree, eat only half. Ask for a box at the beginning of your meal and put the rest of your entree in it so you are not tempted to eat it.  If calories are listed on the menu, choose the lower calorie options.  Choose dishes that include vegetables, fruits, whole grains, low-fat dairy products, and lean protein.  Choose items that are boiled, broiled, grilled, or steamed. Stay away from items that are buttered, battered, fried, or served with cream sauce. Items labeled "crispy" are usually fried, unless stated otherwise.  Choose water, low-fat milk, unsweetened iced tea, or other drinks without added sugar. If you want an alcoholic beverage, choose a lower calorie option such as a glass of wine or light beer.  Ask for dressings, sauces, and syrups on the side. These are usually high in calories, so you should limit the amount you eat.  If you want a salad, choose a garden salad and ask for grilled meats. Avoid extra toppings like bacon, cheese, or fried items. Ask for the dressing on the side, or ask for olive oil and vinegar or lemon to use as dressing.  Estimate how many servings of a food you are given. For example, a serving of  cooked rice is  cup or about the size of half a baseball. Knowing serving sizes will help you be aware of how much food you are eating at restaurants. The list below tells you how big or small some common portion sizes are based on everyday objects: ? 1 oz--4 stacked dice. ? 3 oz--1 deck of cards. ? 1 tsp--1 die. ? 1 Tbsp-- a ping-pong ball. ? 2 Tbsp--1 ping-pong ball. ?  cup-- baseball. ? 1 cup--1 baseball. Summary  Calorie counting means keeping track of how many calories you eat and drink each day. If you eat fewer calories than your body needs, you should lose weight.  A healthy amount of weight to lose per week is usually 1-2 lb (0.5-0.9 kg). This usually means reducing your daily calorie intake by 500-750 calories.  The number of calories in a food can be found on a Nutrition Facts label. If a food does not have a Nutrition Facts label, try to look up the calories online or ask your dietitian for help.  Use your calories on foods and drinks that will fill you up, and not on foods and drinks that will leave you hungry.  Use smaller plates, glasses, and bowls to prevent overeating. This information is not intended to replace advice given to you by your health care provider. Make sure you discuss any questions you have with your health care provider. Document Revised: 06/16/2018 Document Reviewed: 08/27/2016 Elsevier Patient Education  [place].  Diabetes Mellitus and Nutrition, Adult When you have diabetes (diabetes mellitus), it is very important to have healthy eating habits because your blood sugar (glucose) levels are greatly affected by what you eat and drink. Eating healthy foods in the appropriate amounts, at about the same times every day, can help you:  Control your blood glucose.  Lower your risk of heart disease.  Improve your blood pressure.  Reach or maintain a healthy weight. Every person with diabetes is different, and each person has different needs  for  a meal plan. Your health care provider may recommend that you work with a diet and nutrition specialist (dietitian) to make a meal plan that is best for you. Your meal plan may vary depending on factors such as:  The calories you need.  The medicines you take.  Your weight.  Your blood glucose, blood pressure, and cholesterol levels.  Your activity level.  Other health conditions you have, such as heart or kidney disease. How do carbohydrates affect me? Carbohydrates, also called carbs, affect your blood glucose level more than any other type of food. Eating carbs naturally raises the amount of glucose in your blood. Carb counting is a method for keeping track of how many carbs you eat. Counting carbs is important to keep your blood glucose at a healthy level, especially if you use insulin or take certain oral diabetes medicines. It is important to know how many carbs you can safely have in each meal. This is different for every person. Your dietitian can help you calculate how many carbs you should have at each meal and for each snack. Foods that contain carbs include:  Bread, cereal, rice, pasta, and crackers.  Potatoes and corn.  Peas, beans, and lentils.  Milk and yogurt.  Fruit and juice.  Desserts, such as cakes, cookies, ice cream, and candy. How does alcohol affect me? Alcohol can cause a sudden decrease in blood glucose (hypoglycemia), especially if you use insulin or take certain oral diabetes medicines. Hypoglycemia can be a life-threatening condition. Symptoms of hypoglycemia (sleepiness, dizziness, and confusion) are similar to symptoms of having too much alcohol. If your health care provider says that alcohol is safe for you, follow these guidelines:  Limit alcohol intake to no more than 1 drink per day for nonpregnant women and 2 drinks per day for men. One drink equals 12 oz of beer, 5 oz of wine, or 1 oz of hard liquor.  Do not drink on an empty  stomach.  Keep yourself hydrated with water, diet soda, or unsweetened iced tea.  Keep in mind that regular soda, juice, and other mixers may contain a lot of sugar and must be counted as carbs. What are tips for following this plan?  Reading food labels  Start by checking the serving size on the "Nutrition Facts" label of packaged foods and drinks. The amount of calories, carbs, fats, and other nutrients listed on the label is based on one serving of the item. Many items contain more than one serving per package.  Check the total grams (g) of carbs in one serving. You can calculate the number of servings of carbs in one serving by dividing the total carbs by 15. For example, if a food has 30 g of total carbs, it would be equal to 2 servings of carbs.  Check the number of grams (g) of saturated and trans fats in one serving. Choose foods that have low or no amount of these fats.  Check the number of milligrams (mg) of salt (sodium) in one serving. Most people should limit total sodium intake to less than 2,300 mg per day.  Always check the nutrition information of foods labeled as "low-fat" or "nonfat". These foods may be higher in added sugar or refined carbs and should be avoided.  Talk to your dietitian to identify your daily goals for nutrients listed on the label. Shopping  Avoid buying canned, premade, or processed foods. These foods tend to be high in fat, sodium, and added sugar.  Shop  around the outside edge of the grocery store. This includes fresh fruits and vegetables, bulk grains, fresh meats, and fresh dairy. Cooking  Use low-heat cooking methods, such as baking, instead of high-heat cooking methods like deep frying.  Cook using healthy oils, such as olive, canola, or sunflower oil.  Avoid cooking with butter, cream, or high-fat meats. Meal planning  Eat meals and snacks regularly, preferably at the same times every day. Avoid going long periods of time without  eating.  Eat foods high in fiber, such as fresh fruits, vegetables, beans, and whole grains. Talk to your dietitian about how many servings of carbs you can eat at each meal.  Eat 4-6 ounces (oz) of lean protein each day, such as lean meat, chicken, fish, eggs, or tofu. One oz of lean protein is equal to: ? 1 oz of meat, chicken, or fish. ? 1 egg. ?  cup of tofu.  Eat some foods each day that contain healthy fats, such as avocado, nuts, seeds, and fish. Lifestyle  Check your blood glucose regularly.  Exercise regularly as told by your health care provider. This may include: ? 150 minutes of moderate-intensity or vigorous-intensity exercise each week. This could be brisk walking, biking, or water aerobics. ? Stretching and doing strength exercises, such as yoga or weightlifting, at least 2 times a week.  Take medicines as told by your health care provider.  Do not use any products that contain nicotine or tobacco, such as cigarettes and e-cigarettes. If you need help quitting, ask your health care provider.  Work with a Social worker or diabetes educator to identify strategies to manage stress and any emotional and social challenges. Questions to ask a health care provider  Do I need to meet with a diabetes educator?  Do I need to meet with a dietitian?  What number can I call if I have questions?  When are the best times to check my blood glucose? Where to find more information:  American Diabetes Association: diabetes.org  Academy of Nutrition and Dietetics: www.eatright.[employer] of Diabetes and Digestive and Kidney Diseases (NIH): [employer] Summary  A healthy meal plan will help you control your blood glucose and maintain a healthy lifestyle.  Working with a diet and nutrition specialist (dietitian) can help you make a meal plan that is best for you.  Keep in mind that carbohydrates (carbs) and alcohol have immediate effects on your blood glucose  levels. It is important to count carbs and to use alcohol carefully. This information is not intended to replace advice given to you by your health care provider. Make sure you discuss any questions you have with your health care provider. Document Revised: 09/09/2017 Document Reviewed: 11/01/2016 Elsevier Patient Education  2020 [employer].

## 2020-04-29 LAB — CMP14+EGFR
ALT: 23 IU/L (ref 0–32)
AST: 24 IU/L (ref 0–40)
Albumin/Globulin Ratio: 2 (ref 1.2–2.2)
Albumin: 4.7 g/dL (ref 3.8–4.8)
Alkaline Phosphatase: 70 IU/L (ref 48–121)
BUN/Creatinine Ratio: 30 — ABNORMAL HIGH (ref 12–28)
BUN: 29 mg/dL — ABNORMAL HIGH (ref 8–27)
Bilirubin Total: 0.5 mg/dL (ref 0.0–1.2)
CO2: 23 mmol/L (ref 20–29)
Calcium: 10.3 mg/dL (ref 8.7–10.3)
Chloride: 103 mmol/L (ref 96–106)
Creatinine, Ser: 0.97 mg/dL (ref 0.57–1.00)
GFR calc Af Amer: 70 mL/min/{1.73_m2} (ref >59)
GFR calc non Af Amer: 61 mL/min/{1.73_m2} (ref >59)
Globulin, Total: 2.4 g/dL (ref 1.5–4.5)
Glucose: 121 mg/dL — ABNORMAL HIGH (ref 65–99)
Potassium: 4.7 mmol/L (ref 3.5–5.2)
Sodium: 142 mmol/L (ref 134–144)
Total Protein: 7.1 g/dL (ref 6.0–8.5)

## 2020-04-29 LAB — LIPID PANEL
Chol/HDL Ratio: 2.8 ratio (ref 0.0–4.4)
Cholesterol, Total: 106 mg/dL (ref 100–199)
HDL: 38 mg/dL — ABNORMAL LOW (ref >39)
LDL Chol Calc (NIH): 24 mg/dL (ref 0–99)
Triglycerides: 300 mg/dL — ABNORMAL HIGH (ref 0–149)
VLDL Cholesterol Cal: 44 mg/dL — ABNORMAL HIGH (ref 5–40)

## 2020-04-29 LAB — HEMOGLOBIN A1C
Est. average glucose Bld gHb Est-mCnc: 128 mg/dL
Hgb A1c MFr Bld: 6.1 % — ABNORMAL HIGH (ref 4.8–5.6)

## 2020-05-08 ENCOUNTER — Other Ambulatory Visit: Payer: Self-pay | Admitting: Emergency Medicine

## 2020-05-08 DIAGNOSIS — E1142 Type 2 diabetes mellitus with diabetic polyneuropathy: Secondary | ICD-10-CM

## 2020-05-09 DIAGNOSIS — G4733 Obstructive sleep apnea (adult) (pediatric): Secondary | ICD-10-CM | POA: Diagnosis not present

## 2020-05-19 ENCOUNTER — Other Ambulatory Visit: Payer: Self-pay | Admitting: Emergency Medicine

## 2020-05-25 ENCOUNTER — Other Ambulatory Visit: Payer: Self-pay | Admitting: Internal Medicine

## 2020-05-29 ENCOUNTER — Ambulatory Visit: Payer: Medicare Other | Admitting: Internal Medicine

## 2020-05-29 DIAGNOSIS — G4733 Obstructive sleep apnea (adult) (pediatric): Secondary | ICD-10-CM | POA: Diagnosis not present

## 2020-05-29 NOTE — Progress Notes (Deleted)
Follow-up Outpatient Visit Date: 05/29/2020  Primary Care Provider: Horald Pollen, MD [place] [address]  Chief Complaint: ***  HPI:  [name] is a 64 y.o. female with history of CAD status post CABG (1601) complicated by postoperative atrial fibrillation, chronic HFpEF, hypertension, hyperlipidemia, sinus node dysfunction status post PPM, and obstructive sleep apnea on CPAP, who presents for follow-up of CAD and HFpEF.  We last spoke in February via virtual visit, at which time [name] reported resolution of leg edema as well as improvement in shortness of breath following addition of low-dose furosemide.  She was using it every other day when we last spoke.  --------------------------------------------------------------------------------------------------  Past Medical History:  Diagnosis Date   Allergy    Anal fissure    Anxiety    Arthritis    Asthma    Cataract 2010    left   Colon polyps    diverticulosis  03-2011/2005   Depression    Diabetes mellitus 2012   T2DM   Diverticulosis    Fatty liver 04/2011   GERD (gastroesophageal reflux disease)    Hyperlipidemia    Hypertension severe    Migraine    Obesity    OSA (obstructive sleep apnea)    C-Pap   Pacemaker Medtronic    Sinoatrial node dysfunction ([place])    syncope   Past Surgical History:  Procedure Laterality Date   ABDOMINAL HYSTERECTOMY  2000   total   CARDIAC CATHETERIZATION N/A 07/20/2016   Procedure: Left Heart Cath and Coronary Angiography;  Surgeon: Peter M Martinique, MD;  Location: [facility];  Service: Cardiovascular;  Laterality: N/A;   CATARACT EXTRACTION Left    CORONARY ARTERY BYPASS GRAFT N/A 07/22/2016   Procedure: CORONARY ARTERY BYPASS GRAFTING (CABG) x three , using left internal mammary artery and right leg greater saphenous vein harvested endoscopically;  Surgeon: Ivin Poot, MD;  Location: [facility];  Service: Open Heart Surgery;   Laterality: N/A;   EYE SURGERY Right 2010   OOPHORECTOMY     PACEMAKER PLACEMENT     T/A right knee     TEE WITHOUT CARDIOVERSION N/A 07/22/2016   Procedure: TRANSESOPHAGEAL ECHOCARDIOGRAM (TEE);  Surgeon: Ivin Poot, MD;  Location: [facility];  Service: Open Heart Surgery;  Laterality: N/A;   TONSILLECTOMY      No outpatient medications have been marked as taking for the 05/29/20 encounter (Appointment) with Khadeja Abt, Harrell Gave, MD.    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   Vaping Use: Never used  Substance Use Topics   Alcohol use: No    Alcohol/week: 0.0 standard drinks   Drug use: No    Family History  Problem Relation Age of Onset   Heart disease Mother    Cancer Mother 37       lung   Breast cancer Mother        maternal grandmother   Diabetes Sister        type 2   Breast cancer Brother    Cancer Maternal Grandmother        breast   Leukemia Paternal Grandmother        neoplast  Aneurysm Paternal Grandfather        abdominal (stomach)   Heart disease Father    Colon cancer Neg Hx     Review of Systems: A 12-system review of systems was performed and was negative except as noted in the HPI.  --------------------------------------------------------------------------------------------------  Physical Exam: There were no vitals taken for this visit.  General:  *** HEENT: No conjunctival pallor or scleral icterus. Facemask in place. Neck: Supple without lymphadenopathy, thyromegaly, JVD, or HJR. Lungs: Normal work of breathing. Clear to auscultation bilaterally without wheezes or crackles. Heart: Regular rate and rhythm without murmurs, rubs, or gallops.  Non-displaced PMI. Abd: Bowel sounds present. Soft, NT/ND without hepatosplenomegaly Ext: No lower extremity edema. Radial, PT, and DP pulses are 2+ bilaterally. Skin: Warm and dry without rash.  EKG:  ***  Lab Results  Component Value Date   WBC WILL FOLLOW 09/15/2016   HGB WILL FOLLOW 09/15/2016   HCT WILL FOLLOW 09/15/2016   MCV WILL FOLLOW 09/15/2016   PLT WILL FOLLOW 09/15/2016    Lab Results  Component Value Date   NA 142 04/28/2020   K 4.7 04/28/2020   CL 103 04/28/2020   CO2 23 04/28/2020   BUN 29 (H) 04/28/2020   CREATININE 0.97 04/28/2020   GLUCOSE 121 (H) 04/28/2020   ALT 23 04/28/2020    Lab Results  Component Value Date   CHOL 106 04/28/2020   HDL 38 (L) 04/28/2020   LDLCALC 24 04/28/2020   LDLDIRECT 54 08/29/2019   TRIG 300 (H) 04/28/2020   CHOLHDL 2.8 04/28/2020    --------------------------------------------------------------------------------------------------  ASSESSMENT AND PLAN: Harrell Gave Juliza Machnik, MD 05/29/2020 7:21 AM

## 2020-05-30 ENCOUNTER — Encounter: Payer: Self-pay | Admitting: Internal Medicine

## 2020-06-09 ENCOUNTER — Ambulatory Visit: Payer: Medicare Other | Admitting: Family

## 2020-06-09 ENCOUNTER — Other Ambulatory Visit: Payer: Self-pay

## 2020-06-09 ENCOUNTER — Encounter: Payer: Self-pay | Admitting: Family

## 2020-06-09 VITALS — BP 116/76 | HR 69 | Ht 62.0 in | Wt 224.0 lb

## 2020-06-09 DIAGNOSIS — I5032 Chronic diastolic (congestive) heart failure: Secondary | ICD-10-CM

## 2020-06-09 DIAGNOSIS — E785 Hyperlipidemia, unspecified: Secondary | ICD-10-CM | POA: Diagnosis not present

## 2020-06-09 DIAGNOSIS — I25118 Atherosclerotic heart disease of native coronary artery with other forms of angina pectoris: Secondary | ICD-10-CM | POA: Diagnosis not present

## 2020-06-09 DIAGNOSIS — I10 Essential (primary) hypertension: Secondary | ICD-10-CM

## 2020-06-09 MED ORDER — CLOPIDOGREL BISULFATE 75 MG PO TABS: 75.0000 mg | ORAL_TABLET | Freq: Every day | ORAL | 3 refills | Status: DC

## 2020-06-09 NOTE — Patient Instructions (Signed)
Medication Instructions:  NO medication changes today.   *If you need a refill on your cardiac medications before your next appointment, please call your pharmacy*   Lab Work: No lab work today.   If you have labs (blood work) drawn today and your tests are completely normal, you will receive your results only by: Marland Kitchen MyChart Message (if you have MyChart) OR . A paper copy in the mail If you have any lab test that is abnormal or we need to change your treatment, we will call you to review the results.   Testing/Procedures: Your EKG today shows normal sinus.   Follow-Up: At [facility], you and your health needs are our priority.  As part of our continuing mission to provide you with exceptional heart care, we have created designated Provider Care Teams.  These Care Teams include your primary Cardiologist (physician) and Advanced Practice Providers (APPs -  Physician Assistants and Nurse Practitioners) who all work together to provide you with the care you need, when you need it.  We recommend signing up for the patient portal called "MyChart".  Sign up information is provided on this After Visit Summary.  MyChart is used to connect with patients for Virtual Visits (Telemedicine).  Patients are able to view lab/test results, encounter notes, upcoming appointments, etc.  Non-urgent messages can be sent to your provider as well.   To learn more about what you can do with MyChart, go to [email or web address].    Your next appointment:   6 month(s)  The format for your next appointment:   In Person  Provider:    You may see Nelva Bush, MD or one of the following Advanced Practice Providers on your designated Care Team:    Murray Hodgkins, NP  Christell Faith, PA-C  Marrianne Mood, PA-C  Laurann Montana, NP  Other Instructions

## 2020-06-09 NOTE — Progress Notes (Signed)
Office Visit    Patient Name: [name] [name] Date of Encounter: 06/09/2020  Primary Care Provider:  Horald Pollen, MD Primary Cardiologist:  Nelva Bush, MD Electrophysiologist:  None   Chief Complaint    [name] [name] is a 64 y.o. female with a hx of CAD s/p CABG 07/2016 with postoperative atrial fibrillation, chronic HFpEF, HTN, HLD, sinus node dysfunction s/p PPM, sleep apnea on CPAP presents today for follow-up of CAD  Past Medical History    Past Medical History:  Diagnosis Date  . Allergy   . Anal fissure   . Anxiety   . Arthritis   . Asthma   . Cataract 2010    left  . Colon polyps    diverticulosis  03-2011/2005  . Depression   . Diabetes mellitus 2012   T2DM  . Diverticulosis   . Fatty liver 04/2011  . GERD (gastroesophageal reflux disease)   . Hyperlipidemia   . Hypertension severe   . Migraine   . Obesity   . OSA (obstructive sleep apnea)    C-Pap  . Pacemaker Medtronic   . Sinoatrial node dysfunction ([facility])    syncope   Past Surgical History:  Procedure Laterality Date  . ABDOMINAL HYSTERECTOMY  2000   total  . CARDIAC CATHETERIZATION N/A 07/20/2016   Procedure: Left Heart Cath and Coronary Angiography;  Surgeon: Peter M Martinique, MD;  Location: [facility];  Service: Cardiovascular;  Laterality: N/A;  . CATARACT EXTRACTION Left   . CORONARY ARTERY BYPASS GRAFT N/A 07/22/2016   Procedure: CORONARY ARTERY BYPASS GRAFTING (CABG) x three , using left internal mammary artery and right leg greater saphenous vein harvested endoscopically;  Surgeon: Ivin Poot, MD;  Location: [facility];  Service: Open Heart Surgery;  Laterality: N/A;  . EYE SURGERY Right 2010  . OOPHORECTOMY    . PACEMAKER PLACEMENT    . T/A right knee    . TEE WITHOUT CARDIOVERSION N/A 07/22/2016   Procedure: TRANSESOPHAGEAL ECHOCARDIOGRAM (TEE);  Surgeon: Ivin Poot, MD;  Location: [facility];  Service: Open Heart Surgery;  Laterality: N/A;  . TONSILLECTOMY       Allergies  Allergies  Allergen Reactions  . Aspirin     Sores in mouth  . Adhesive [Tape] Other (See Comments)    SKIN BLISTERS  . Penicillins     Swelling   . Red Yeast Rice Hives  . Sulfa Drugs Cross Reactors Hives    History of Present Illness    [name] [name] is a 64 y.o. female with a hx of CAD s/p CABG 07/2016 with postoperative atrial fibrillation, chronic HFpEF, HTN, HLD, sinus node dysfunction s/p PPM, sleep apnea on CPAP.  She was last seen 11/29/2019 by Dr. Saunders Revel via telemedicine.  Reports feeling overall well.  She is presently using Lasix every other day with good control of her weight, edema, breathing.  She previously had difficulty with sores in her mouth while taking aspirin and was transitioned to Plavix by Dr. Saunders Revel.  She has had no difficulties on this medication.  Reports no chest pain, pressure, tightness.  She is very proud that she did not gain weight during the pandemic but is hopeful to be able to lose some weight.  No formal exercise routine.  We discussed starting a walking regimen.  EKGs/Labs/Other Studies Reviewed:   The following studies were reviewed today:   Cath/PCI:  LHC (07/20/16): The LMCA normal.  LAD with 90% proximal, 80% mid, and 70% distal stenoses  as well as 90% stenosis of the ostium of a large septal branch.  LCx with 100% ostial/proximal occlusion with right to left collaterals.  PL branch with 100% occlusion with filling via left-to-right collaterals.  Mid anterior hypokinesis with LVEF of 50-55%.   CV Surgery:  CABG (07/25/16): LIMA to LAD, SVG to diagonal, and SVG to OM).   EP Procedures and Devices:  Dual-chamber pacemaker (09/10/08, Dr. Caryl Comes): Medtronic   Non-Invasive Evaluation(s):  TTE (07/21/16): Normal LV size and wall thickness.  LVEF 60-65% with normal wall motion.  Grade 1 diastolic dysfunction.  Mild left atrial enlargement.  Normal RV size and function.  Exercise MPI (01/30/13): Low risk study without ischemia.  Poor  exercise capacity.  Wide-complex tachycardia during stress (question rate related LBBB).  LVEF 73%.  EKG:  EKG is  ordered today.  The ekg ordered today demonstrates SR 69 bpm with no acute ST/T wave changes.   Recent Labs: 04/28/2020: ALT 23; BUN 29; Creatinine, Ser 0.97; Potassium 4.7; Sodium 142  Recent Lipid Panel    Component Value Date/Time   CHOL 106 04/28/2020 1056   TRIG 300 (H) 04/28/2020 1056   HDL 38 (L) 04/28/2020 1056   CHOLHDL 2.8 04/28/2020 1056   CHOLHDL 6.5 07/21/2016 0537   VLDL 51 (H) 07/21/2016 0537   LDLCALC 24 04/28/2020 1056   LDLDIRECT 54 08/29/2019 1201   LDLDIRECT 195 (H) 04/18/2013 1018    Home Medications   Current Meds  Medication Sig  . albuterol (VENTOLIN HFA) 108 (90 Base) MCG/ACT inhaler Inhale 1-2 puffs into the lungs every 6 (six) hours as needed for wheezing or shortness of breath.  . Alirocumab (PRALUENT) 75 MG/ML SOAJ Inject 75 mg into the skin every 14 (fourteen) days.  Marland Kitchen atorvastatin (LIPITOR) 80 MG tablet TAKE 1 TABLET BY MOUTH ONCE DAILY AT 6 PM  . Blood Glucose Monitoring Suppl (ACCU-CHEK GUIDE) w/Device KIT 1 Device by Does not apply route in the morning, at noon, and at bedtime.  . cetirizine (ZYRTEC) 10 MG tablet Take 10 mg by mouth daily.    . citalopram (CELEXA) 20 MG tablet TAKE 1 TABLET BY MOUTH  DAILY  . clopidogrel (PLAVIX) 75 MG tablet Take 1 tablet by mouth once daily  . cyclobenzaprine (FLEXERIL) 10 MG tablet TAKE 1 TABLET BY MOUTH AT  BEDTIME AS NEEDED AND MAY  REPEAT DOSE ONE TIME IF  NEEDED FOR MUSCLE SPASMS  (CAREFUL OF SEDATION)  . Dulaglutide (TRULICITY) 8.18 EX/9.3ZJ SOPN Inject 0.75 mg into the skin once a week.  . fluticasone (FLONASE) 50 MCG/ACT nasal spray Place 2 sprays into both nostrils daily.  . fluticasone (FLOVENT HFA) 110 MCG/ACT inhaler Inhale 2 puffs into the lungs 2 (two) times daily.  . furosemide (LASIX) 20 MG tablet TAKE 1 TABLET BY MOUTH  DAILY AS NEEDED FOR EDEMA  (SWELLING)  . gabapentin (NEURONTIN)  300 MG capsule TAKE 2 CAPSULES BY MOUTH IN THE MORNING AND 3 CAPSULES  AT BEDTIME  . glimepiride (AMARYL) 4 MG tablet TAKE ONE-HALF TABLET BY  MOUTH TWICE DAILY  . glucose blood test strip Use as instructed  . hydrochlorothiazide (HYDRODIURIL) 25 MG tablet Take 1 tablet (25 mg total) by mouth daily.  Marland Kitchen lidocaine (XYLOCAINE) 2 % solution Use as directed 5-10 mLs in the mouth or throat every 3 (three) hours as needed.  Marland Kitchen lisinopril (ZESTRIL) 40 MG tablet TAKE 1 TABLET BY MOUTH  DAILY  . metFORMIN (GLUCOPHAGE) 500 MG tablet TAKE 1 TABLET BY MOUTH  TWICE  DAILY WITH A MEAL  . metoprolol succinate (TOPROL-XL) 50 MG 24 hr tablet TAKE 1 TABLET BY MOUTH  TWICE DAILY WITH OR  IMMEDIATELY FOLLOWING A  MEAL  . Multiple Vitamin (MULTIVITAMIN) tablet Take 1 tablet by mouth daily.  Marland Kitchen nystatin cream (MYCOSTATIN) Apply 1 application topically as needed for dry skin.  Marland Kitchen omeprazole (PRILOSEC) 20 MG capsule Take 1 capsule (20 mg total) by mouth daily.  . ondansetron (ZOFRAN-ODT) 8 MG disintegrating tablet DISSOLVE 1 ON TONGUE EVERY 12 HOURS AS NEEDED FOR NAUSEA  . traMADol (ULTRAM) 50 MG tablet TAKE 1 TABLET BY MOUTH AT  BEDTIME AS NEEDED  . triamcinolone cream (KENALOG) 0.1 % Apply 1 application topically as needed.      Review of Systems       Review of Systems  Constitutional: Negative for chills, fever and malaise/fatigue.  Cardiovascular: Negative for chest pain, dyspnea on exertion, leg swelling, near-syncope, orthopnea, palpitations and syncope.  Respiratory: Negative for cough, shortness of breath and wheezing.   Gastrointestinal: Negative for nausea and vomiting.  Neurological: Negative for dizziness, light-headedness and weakness.   All other systems reviewed and are otherwise negative except as noted above.  Physical Exam    VS:  BP 116/76 (BP Location: Left Arm, Patient Position: Sitting, Cuff Size: Normal)   Pulse 69   Ht 5' 2" (1.575 m)   Wt 224 lb (101.6 kg)   BMI 40.97 kg/m  , BMI  Body mass index is 40.97 kg/m. GEN: Well nourished, overweight,  well developed, in no acute distress. HEENT: normal. Neck: Supple, no JVD, carotid bruits, or masses. Cardiac: RRR, no murmurs, rubs, or gallops. No clubbing, cyanosis, edema.  Radials/DP/PT 2+ and equal bilaterally.  Respiratory:  Respirations regular and unlabored, clear to auscultation bilaterally. GI: Soft, nontender, nondistended, BS + x 4. MS: No deformity or atrophy. Skin: Warm and dry, no rash. Neuro:  Strength and sensation are intact. Psych: Normal affect.  Assessment & Plan    1. CAD s/p CABG 2017 -stable no anginal symptoms.  GDMT includes Plavix, statin, beta-blocker.  No aspirin secondary to previous intolerance. 2. HFpEF -euvolemic and well compensated.  GDMT includes lisinopril, metoprolol, and Lasix every other day.  Low-sodium, heart healthy diet encouraged. 3. HTN - BP well controlled. Continue current antihypertensive regimen.  4. HLD, LDL goal <70 -lipid panel 04/28/2020 with LDL 24.  Continue atorvastatin 80 mg daily.  Of note her triglycerides were 300 however this was not a fasting lab, recommend reduction of intake of sweets, sugars, carbohydrates. 5. Obesity -weight loss via diet and exercise encouraged. 6. DM2 -continue to follow with primary care provider. 7. Sinus node dysfunction -remote device check 03/2020 without acute findings.  Continue to climb.  Disposition: Follow up in 6 month(s) with Dr. Saunders Revel or APP  Loel Dubonnet, NP 06/09/2020, 2:06 PM

## 2020-06-10 NOTE — Progress Notes (Signed)
Patient was left a vm to call and schedule fu appt with Caryl Comes, recall provider has been updated to Dr. Caryl Comes

## 2020-06-13 ENCOUNTER — Ambulatory Visit (INDEPENDENT_AMBULATORY_CARE_PROVIDER_SITE_OTHER): Payer: Medicare Other | Admitting: *Deleted

## 2020-06-13 DIAGNOSIS — I495 Sick sinus syndrome: Secondary | ICD-10-CM

## 2020-06-14 LAB — CUP PACEART REMOTE DEVICE CHECK
Battery Impedance: 1205 Ohm
Battery Remaining Longevity: 64 mo
Battery Voltage: 2.77 V
Brady Statistic AP VP Percent: 0 %
Brady Statistic AP VS Percent: 0 %
Brady Statistic AS VP Percent: 0 %
Brady Statistic AS VS Percent: 100 %
Date Time Interrogation Session: 20210902204427
Implantable Lead Implant Date: 20091201
Implantable Lead Implant Date: 20091201
Implantable Lead Location: 753859
Implantable Lead Location: 753860
Implantable Lead Model: 5076
Implantable Lead Model: 5076
Implantable Pulse Generator Implant Date: 20091201
Lead Channel Impedance Value: 396 Ohm
Lead Channel Impedance Value: 519 Ohm
Lead Channel Pacing Threshold Amplitude: 0.5 V
Lead Channel Pacing Threshold Amplitude: 0.75 V
Lead Channel Pacing Threshold Pulse Width: 0.4 ms
Lead Channel Pacing Threshold Pulse Width: 0.4 ms
Lead Channel Setting Pacing Amplitude: 2 V
Lead Channel Setting Pacing Amplitude: 2.5 V
Lead Channel Setting Pacing Pulse Width: 0.4 ms
Lead Channel Setting Sensing Sensitivity: 4 mV

## 2020-06-17 DIAGNOSIS — R109 Unspecified abdominal pain: Secondary | ICD-10-CM | POA: Diagnosis not present

## 2020-06-17 DIAGNOSIS — Z86010 Personal history of colonic polyps: Secondary | ICD-10-CM | POA: Diagnosis not present

## 2020-06-17 DIAGNOSIS — K580 Irritable bowel syndrome with diarrhea: Secondary | ICD-10-CM | POA: Diagnosis not present

## 2020-06-17 NOTE — Progress Notes (Signed)
Remote pacemaker transmission.   

## 2020-06-29 DIAGNOSIS — G4733 Obstructive sleep apnea (adult) (pediatric): Secondary | ICD-10-CM | POA: Diagnosis not present

## 2020-07-01 ENCOUNTER — Encounter: Payer: Medicare Other | Admitting: Internal Medicine

## 2020-07-28 ENCOUNTER — Other Ambulatory Visit: Payer: Self-pay | Admitting: Internal Medicine

## 2020-07-29 DIAGNOSIS — G4733 Obstructive sleep apnea (adult) (pediatric): Secondary | ICD-10-CM | POA: Diagnosis not present

## 2020-08-05 ENCOUNTER — Other Ambulatory Visit: Payer: Self-pay

## 2020-08-05 ENCOUNTER — Encounter: Payer: Self-pay | Admitting: Internal Medicine

## 2020-08-05 ENCOUNTER — Ambulatory Visit: Payer: Medicare Other | Admitting: Internal Medicine

## 2020-08-05 VITALS — BP 108/78 | HR 61 | Ht 62.0 in | Wt 221.0 lb

## 2020-08-05 DIAGNOSIS — I480 Paroxysmal atrial fibrillation: Secondary | ICD-10-CM | POA: Diagnosis not present

## 2020-08-05 DIAGNOSIS — I5032 Chronic diastolic (congestive) heart failure: Secondary | ICD-10-CM

## 2020-08-05 DIAGNOSIS — I495 Sick sinus syndrome: Secondary | ICD-10-CM | POA: Diagnosis not present

## 2020-08-05 DIAGNOSIS — Z950 Presence of cardiac pacemaker: Secondary | ICD-10-CM | POA: Diagnosis not present

## 2020-08-05 NOTE — Progress Notes (Signed)
Patient Care Team: Horald Pollen, MD as PCP - General (Internal Medicine) End, Harrell Gave, MD as PCP - Cardiology (Cardiology) Himmelrich, Bryson Ha, RD (Inactive) as Dietitian Deboraha Sprang, MD as Attending Physician (Cardiology) Marygrace Drought, MD as Consulting Physician (Ophthalmology) Vickey Huger, MD as Consulting Physician (Orthopedic Surgery)   HPI  [name] [name] is a 64 y.o. female seen in follow-up for pacemaker implantation-Medtronic for  sinus node dysfunction. She paces almost never.   10/17 STEMI  Cath>>  Severe 3 vessel obstructive CAD   . Coronary artery bypass grafting x3 (left internal mammary artery to LAD, saphenous vein graft to diagonal, saphenous vein graft to obtuse marginal).  DATE TEST EF   10/17 TEE 60-65%         Date Cr K LDL  7/21 0.97 4.7 24           She had lost 20 lbs and felt much better; however, she has regained the weight   The patient denies chest pain, shortness of breath, nocturnal dyspnea, orthopnea.  Had some peripheral edema which is resolved with the introduction of every other day Lasix.  There have been no palpitations, lightheadedness or syncope.    She is using her CPAP.     Past Medical History:  Diagnosis Date  . Allergy   . Anal fissure   . Anxiety   . Arthritis   . Asthma   . Cataract 2010    left  . Colon polyps    diverticulosis  03-2011/2005  . Depression   . Diabetes mellitus 2012   T2DM  . Diverticulosis   . Fatty liver 04/2011  . GERD (gastroesophageal reflux disease)   . Hyperlipidemia   . Hypertension severe   . Migraine   . Obesity   . OSA (obstructive sleep apnea)    C-Pap  . Pacemaker Medtronic   . Sinoatrial node dysfunction ([facility])    syncope    Past Surgical History:  Procedure Laterality Date  . ABDOMINAL HYSTERECTOMY  2000   total  . CARDIAC CATHETERIZATION N/A 07/20/2016   Procedure: Left Heart Cath and Coronary Angiography;  Surgeon: Peter M Martinique, MD;   Location: [facility];  Service: Cardiovascular;  Laterality: N/A;  . CATARACT EXTRACTION Left   . CORONARY ARTERY BYPASS GRAFT N/A 07/22/2016   Procedure: CORONARY ARTERY BYPASS GRAFTING (CABG) x three , using left internal mammary artery and right leg greater saphenous vein harvested endoscopically;  Surgeon: Ivin Poot, MD;  Location: [facility];  Service: Open Heart Surgery;  Laterality: N/A;  . EYE SURGERY Right 2010  . OOPHORECTOMY    . PACEMAKER PLACEMENT    . T/A right knee    . TEE WITHOUT CARDIOVERSION N/A 07/22/2016   Procedure: TRANSESOPHAGEAL ECHOCARDIOGRAM (TEE);  Surgeon: Ivin Poot, MD;  Location: [facility];  Service: Open Heart Surgery;  Laterality: N/A;  . TONSILLECTOMY      Current Outpatient Medications  Medication Sig Dispense Refill  . albuterol (VENTOLIN HFA) 108 (90 Base) MCG/ACT inhaler Inhale 1-2 puffs into the lungs every 6 (six) hours as needed for wheezing or shortness of breath. 6.7 g 3  . Alirocumab (PRALUENT) 75 MG/ML SOAJ Inject 75 mg into the skin every 14 (fourteen) days. 2 pen 11  . atorvastatin (LIPITOR) 80 MG tablet TAKE 1 TABLET BY MOUTH ONCE DAILY AT 6 PM 90 tablet 3  . Blood Glucose Monitoring Suppl (ACCU-CHEK GUIDE) w/Device KIT 1 Device by Does not  apply route in the morning, at noon, and at bedtime. 1 kit 0  . cetirizine (ZYRTEC) 10 MG tablet Take 10 mg by mouth daily.      . citalopram (CELEXA) 20 MG tablet TAKE 1 TABLET BY MOUTH  DAILY 90 tablet 3  . clopidogrel (PLAVIX) 75 MG tablet Take 1 tablet (75 mg total) by mouth daily. 90 tablet 3  . cyclobenzaprine (FLEXERIL) 10 MG tablet TAKE 1 TABLET BY MOUTH AT  BEDTIME AS NEEDED AND MAY  REPEAT DOSE ONE TIME IF  NEEDED FOR MUSCLE SPASMS  (CAREFUL OF SEDATION) 30 tablet 1  . Dulaglutide (TRULICITY) 5.79 UX/8.3FX SOPN Inject 0.75 mg into the skin once a week. 4 pen 3  . fluticasone (FLONASE) 50 MCG/ACT nasal spray Place 2 sprays into both nostrils daily. 48 g 3  . fluticasone (FLOVENT HFA) 110  MCG/ACT inhaler Inhale 2 puffs into the lungs 2 (two) times daily. 1 Inhaler 12  . furosemide (LASIX) 20 MG tablet TAKE 1 TABLET BY MOUTH  DAILY AS NEEDED FOR EDEMA,  SWELLING 90 tablet 2  . gabapentin (NEURONTIN) 300 MG capsule TAKE 2 CAPSULES BY MOUTH IN THE MORNING AND 3 CAPSULES  AT BEDTIME 450 capsule 0  . glimepiride (AMARYL) 4 MG tablet TAKE ONE-HALF TABLET BY  MOUTH TWICE DAILY 90 tablet 1  . glucose blood test strip Use as instructed 200 each 5  . hydrochlorothiazide (HYDRODIURIL) 25 MG tablet TAKE 1 TABLET BY MOUTH  DAILY 90 tablet 2  . lidocaine (XYLOCAINE) 2 % solution Use as directed 5-10 mLs in the mouth or throat every 3 (three) hours as needed. 100 mL 0  . lisinopril (ZESTRIL) 40 MG tablet TAKE 1 TABLET BY MOUTH  DAILY 90 tablet 3  . lisinopril (ZESTRIL) 40 MG tablet SMARTSIG:1 Tablet(s) By Mouth Daily    . metFORMIN (GLUCOPHAGE) 500 MG tablet TAKE 1 TABLET BY MOUTH  TWICE DAILY WITH A MEAL 180 tablet 1  . metoprolol succinate (TOPROL-XL) 50 MG 24 hr tablet TAKE 1 TABLET BY MOUTH  TWICE DAILY WITH OR  IMMEDIATELY FOLLOWING A  MEAL 180 tablet 3  . Multiple Vitamin (MULTIVITAMIN) tablet Take 1 tablet by mouth daily.    Marland Kitchen nystatin cream (MYCOSTATIN) Apply 1 application topically as needed for dry skin. 30 g 1  . omeprazole (PRILOSEC) 20 MG capsule Take 1 capsule (20 mg total) by mouth daily. 90 capsule 1  . ondansetron (ZOFRAN-ODT) 8 MG disintegrating tablet DISSOLVE 1 ON TONGUE EVERY 12 HOURS AS NEEDED FOR NAUSEA 30 tablet 3  . polycarbophil (FIBERCON) 625 MG tablet Take by mouth.    . traMADol (ULTRAM) 50 MG tablet TAKE 1 TABLET BY MOUTH AT  BEDTIME AS NEEDED 30 tablet 1  . triamcinolone cream (KENALOG) 0.1 % Apply 1 application topically as needed. 30 g 2   No current facility-administered medications for this visit.    Allergies  Allergen Reactions  . Aspirin     Sores in mouth  . Adhesive [Tape] Other (See Comments)    SKIN BLISTERS  . Penicillins     Swelling   . Red  Yeast Rice Hives  . Sulfa Drugs Cross Reactors Hives    Review of Systems negative except from HPI and PMH  Physical Exam BP 108/78 (BP Location: Left Arm, Patient Position: Sitting, Cuff Size: Large)   Pulse 61   Ht _0  (1.575 m)   Wt 221 lb (100.2 kg)   SpO2 96%   BMI 40.42 kg/m  Well  developed and well nourished in no acute distress HENT normal Neck supple with JVP-flat Clear Device pocket well healed; without hematoma or erythema.  There is no tethering  Regular rate and rhythm, no  gallop murmur Abd-soft with active BS No Clubbing cyanosis  edema Skin-warm and dry A & Oriented  Grossly normal sensory and motor function  ECG sinus at 61 Intervals 14/09/44  Assessment and  Plan  Hypertension  HFpEF  Syncope  OSA  Atrial fibrillation-paroxysmal  Ischemic heart disease status post CABG 3   Pacemaker-Medtronic   Sinus node dysfunction No intercurrent atrial fibrillation or flutter  Without symptoms of ischemia;  LDL at range  No syncope  No atrial pacing to speak of  Weight has gone back up; however, she is working on getting her back down.  Encouraged her in this--but have gotten her hemoglobin A1c down to 6

## 2020-08-05 NOTE — Patient Instructions (Signed)

## 2020-08-12 ENCOUNTER — Encounter: Payer: Self-pay | Admitting: Emergency Medicine

## 2020-08-25 DIAGNOSIS — G4733 Obstructive sleep apnea (adult) (pediatric): Secondary | ICD-10-CM | POA: Diagnosis not present

## 2020-08-29 DIAGNOSIS — G4733 Obstructive sleep apnea (adult) (pediatric): Secondary | ICD-10-CM | POA: Diagnosis not present

## 2020-09-01 ENCOUNTER — Ambulatory Visit (INDEPENDENT_AMBULATORY_CARE_PROVIDER_SITE_OTHER): Payer: Medicare Other | Admitting: Emergency Medicine

## 2020-09-01 ENCOUNTER — Other Ambulatory Visit: Payer: Self-pay

## 2020-09-01 VITALS — BP 108/78 | Ht 62.0 in | Wt 221.0 lb

## 2020-09-01 DIAGNOSIS — Z0000 Encounter for general adult medical examination without abnormal findings: Secondary | ICD-10-CM | POA: Diagnosis not present

## 2020-09-01 NOTE — Patient Instructions (Addendum)
Thank you for taking time to come for your Medicare Wellness Visit. I appreciate your ongoing commitment to your health goals. Please review the following plan we discussed and let me know if I can assist you in the future.  Cliford Sequeira LPN  Preventive Care 65 Years and Older, Female Preventive care refers to lifestyle choices and visits with your health care provider that can promote health and wellness. This includes:  A yearly physical exam. This is also called an annual well check.  Regular dental and eye exams.  Immunizations.  Screening for certain conditions.  Healthy lifestyle choices, such as diet and exercise. What can I expect for my preventive care visit? Physical exam Your health care provider will check:  Height and weight. These may be used to calculate body mass index (BMI), which is a measurement that tells if you are at a healthy weight.  Heart rate and blood pressure.  Your skin for abnormal spots. Counseling Your health care provider may ask you questions about:  Alcohol, tobacco, and drug use.  Emotional well-being.  Home and relationship well-being.  Sexual activity.  Eating habits.  History of falls.  Memory and ability to understand (cognition).  Work and work environment.  Pregnancy and menstrual history. What immunizations do I need?  Influenza (flu) vaccine  This is recommended every year. Tetanus, diphtheria, and pertussis (Tdap) vaccine  You may need a Td booster every 10 years. Varicella (chickenpox) vaccine  You may need this vaccine if you have not already been vaccinated. Zoster (shingles) vaccine  You may need this after age 60. Pneumococcal conjugate (PCV13) vaccine  One dose is recommended after age 65. Pneumococcal polysaccharide (PPSV23) vaccine  One dose is recommended after age 65. Measles, mumps, and rubella (MMR) vaccine  You may need at least one dose of MMR if you were born in 1957 or later. You may also  need a second dose. Meningococcal conjugate (MenACWY) vaccine  You may need this if you have certain conditions. Hepatitis A vaccine  You may need this if you have certain conditions or if you travel or work in places where you may be exposed to hepatitis A. Hepatitis B vaccine  You may need this if you have certain conditions or if you travel or work in places where you may be exposed to hepatitis B. Haemophilus influenzae type b (Hib) vaccine  You may need this if you have certain conditions. You may receive vaccines as individual doses or as more than one vaccine together in one shot (combination vaccines). Talk with your health care provider about the risks and benefits of combination vaccines. What tests do I need? Blood tests  Lipid and cholesterol levels. These may be checked every 5 years, or more frequently depending on your overall health.  Hepatitis C test.  Hepatitis B test. Screening  Lung cancer screening. You may have this screening every year starting at age 55 if you have a 30-pack-year history of smoking and currently smoke or have quit within the past 15 years.  Colorectal cancer screening. All adults should have this screening starting at age 50 and continuing until age 75. Your health care provider may recommend screening at age 45 if you are at increased risk. You will have tests every 1-10 years, depending on your results and the type of screening test.  Diabetes screening. This is done by checking your blood sugar (glucose) after you have not eaten for a while (fasting). You may have this done every 1-3   years.  Mammogram. This may be done every 1-2 years. Talk with your health care provider about how often you should have regular mammograms.  BRCA-related cancer screening. This may be done if you have a family history of breast, ovarian, tubal, or peritoneal cancers. Other tests  Sexually transmitted disease (STD) testing.  Bone density scan. This is done  to screen for osteoporosis. You may have this done starting at age 94. Follow these instructions at home: Eating and drinking  Eat a diet that includes fresh fruits and vegetables, whole grains, lean protein, and low-fat dairy products. Limit your intake of foods with high amounts of sugar, saturated fats, and salt.  Take vitamin and mineral supplements as recommended by your health care provider.  Do not drink alcohol if your health care provider tells you not to drink.  If you drink alcohol: ? Limit how much you have to 0-1 drink a day. ? Be aware of how much alcohol is in your drink. In the [place] one drink equals one 12 oz bottle of beer (355 mL), one 5 oz glass of wine (148 mL), or one 1 oz glass of hard liquor (44 mL). Lifestyle  Take daily care of your teeth and gums.  Stay active. Exercise for at least 30 minutes on 5 or more days each week.  Do not use any products that contain nicotine or tobacco, such as cigarettes, e-cigarettes, and chewing tobacco. If you need help quitting, ask your health care provider.  If you are sexually active, practice safe sex. Use a condom or other form of protection in order to prevent STIs (sexually transmitted infections).  Talk with your health care provider about taking a low-dose aspirin or statin. What's next?  Go to your health care provider once a year for a well check visit.  Ask your health care provider how often you should have your eyes and teeth checked.  Stay up to date on all vaccines. This information is not intended to replace advice given to you by your health care provider. Make sure you discuss any questions you have with your health care provider. Document Revised: 09/21/2018 Document Reviewed: 09/21/2018 Elsevier Patient Education  2020 [employer].

## 2020-09-01 NOTE — Progress Notes (Signed)
Presents today for [employer] Visit   Date of last exam: 04-28-2020  Interpreter used for this visit? No  I connected with  [name] [name] on 09/01/20 by a telephone  and verified that I am speaking with the correct person using two identifiers.   I discussed the limitations of evaluation and management by telemedicine. The patient expressed understanding and agreed to proceed.  Patient location: home  Provider location: in office  I provided 20 minutes of non face - to - face time during this encounter.  Patient Care Team: Horald Pollen, MD as PCP - General (Internal Medicine) End, Harrell Gave, MD as PCP - Cardiology (Cardiology) Himmelrich, Bryson Ha, RD (Inactive) as Dietitian Deboraha Sprang, MD as Attending Physician (Cardiology) Marygrace Drought, MD as Consulting Physician (Ophthalmology) Vickey Huger, MD as Consulting Physician (Orthopedic Surgery)   Other items to address today:  Discussed immunizations Discussed Eye/Dental  Booster COVID [employer] and Flu shot Patient will bring cards with her to follow up  1-18 @ 8:20 Dr. Mitchel Honour.   Other Screening: Last screening for diabetes: 04/28/2020 Last lipid screening:04/28/2020  ADVANCE DIRECTIVES: Discussed: yes On File: yes Materials Provided: no  Immunization status:  Immunization History  Administered Date(s) Administered  . Hepatitis B 01/03/2012  . Influenza, Seasonal, Injecte, Preservative Fre 09/26/2012  . Influenza,inj,Quad PF,6+ Mos 11/22/2013, 09/10/2014, 09/15/2016, 10/19/2018  . Influenza-Unspecified 06/21/2019  . Moderna SARS-COVID-2 Vaccination 11/25/2019, 12/24/2019  . Pneumococcal Polysaccharide-23 04/10/2008, 10/29/2019  . Tdap 08/31/2010  . Zoster Recombinat (Shingrix) 06/21/2019     Health Maintenance Due  Topic Date Due  . MAMMOGRAM  09/02/2017  . DEXA SCAN  Never done  . INFLUENZA VACCINE  05/11/2020  . TETANUS/TDAP  08/31/2020     Functional Status  Survey: Is the patient deaf or have difficulty hearing?: No Does the patient have difficulty seeing, even when wearing glasses/contacts?: No Does the patient have difficulty concentrating, remembering, or making decisions?: No Does the patient have difficulty walking or climbing stairs?: No Does the patient have difficulty dressing or bathing?: No Does the patient have difficulty doing errands alone such as visiting a doctor's office or shopping?: No   6CIT Screen 09/01/2020  What Year? 0 points  What month? 0 points  What time? 0 points  Count back from 20 0 points  Months in reverse 0 points  Repeat phrase 0 points  Total Score 0        Clinical Support from 09/01/2020 in [place] at [facility]  AUDIT-C Score 0       Home Environment:   Lives in one story but has a full basement No scattered rugs Yes grab bars Adequate lighting/ no clutter No trouble climbing stairs  Patient Active Problem List   Diagnosis Date Noted  . Trigger finger, right middle finger 11/07/2019  . Hypertension associated with diabetes ([place]) 10/29/2019  . Chronic heart failure with preserved ejection fraction (HFpEF) ([place]) 05/25/2019  . Coronary artery disease involving native coronary artery of native heart without angina pectoris 02/14/2018  . Morbid obesity with body mass index (BMI) of 40.0 to 44.9 in adult ([place]) 02/14/2018  . Irritable bowel syndrome 06/28/2017  . S/P CABG x 3 07/22/2016  . Multiple vessel coronary artery disease 07/21/2016  . STEMI involving left circumflex coronary artery ([place]) 07/20/2016  . Asthmatic bronchitis 04/26/2016  . Allergic urticaria 04/26/2016  . Environmental allergies 04/26/2016  . Anxiety 04/26/2016  . Sinus node dysfunction ([place]) 04/26/2016  . Pacemaker Medtronic   .  Ventricular tachycardia, nonsustained-during exercise testing 03/07/2013  . OSA (obstructive sleep apnea) 05/25/2011  . Hx of adenomatous colonic polyps 05/25/2011  . Migraine 05/25/2011    . Diabetes mellitus type 2, uncontrolled, with complications ([place]) 74/16/3845  . GERD (gastroesophageal reflux disease) 05/25/2011  . NAFLD (nonalcoholic fatty liver disease) 05/25/2011  . Hyperlipidemia LDL goal <70 05/25/2011  . Essential hypertension 09/16/2009  . DIASTOLIC HEART FAILURE, CHRONIC 09/16/2009     Past Medical History:  Diagnosis Date  . Allergy   . Anal fissure   . Anxiety   . Arthritis   . Asthma   . Cataract 2010    left  . Colon polyps    diverticulosis  03-2011/2005  . Depression   . Diabetes mellitus 2012   T2DM  . Diverticulosis   . Fatty liver 04/2011  . GERD (gastroesophageal reflux disease)   . Hyperlipidemia   . Hypertension severe   . Migraine   . Obesity   . OSA (obstructive sleep apnea)    C-Pap  . Pacemaker Medtronic   . Sinoatrial node dysfunction (H[facility])    syncope     Past Surgical History:  Procedure Laterality Date  . ABDOMINAL HYSTERECTOMY  2000   total  . CARDIAC CATHETERIZATION N/A 07/20/2016   Procedure: Left Heart Cath and Coronary Angiography;  Surgeon: Peter M Martinique, MD;  Location: [facility];  Service: Cardiovascular;  Laterality: N/A;  . CATARACT EXTRACTION Left   . CORONARY ARTERY BYPASS GRAFT N/A 07/22/2016   Procedure: CORONARY ARTERY BYPASS GRAFTING (CABG) x three , using left internal mammary artery and right leg greater saphenous vein harvested endoscopically;  Surgeon: Ivin Poot, MD;  Location: [facility];  Service: Open Heart Surgery;  Laterality: N/A;  . EYE SURGERY Right 2010  . OOPHORECTOMY    . PACEMAKER PLACEMENT    . T/A right knee    . TEE WITHOUT CARDIOVERSION N/A 07/22/2016   Procedure: TRANSESOPHAGEAL ECHOCARDIOGRAM (TEE);  Surgeon: Ivin Poot, MD;  Location: [facility];  Service: Open Heart Surgery;  Laterality: N/A;  . TONSILLECTOMY       Family History  Problem Relation Age of Onset  . Heart disease Mother   . Cancer Mother 32       lung  . Breast cancer Mother        maternal  grandmother  . Diabetes Sister        type 2  . Breast cancer Brother   . Cancer Maternal Grandmother        breast  . Leukemia Paternal Grandmother        neoplast  . Aneurysm Paternal Grandfather        abdominal (stomach)  . Heart disease Father   . Colon cancer Neg Hx      Social History   Socioeconomic History  . Marital status: Single    Spouse name: Not on file  . Number of children: 0  . Years of education: Not on file  . Highest education level: Not on file  Occupational History  . Occupation: Surveyor, quantity: [employer]  Tobacco Use  . Smoking status: Former Smoker    Types: Cigarettes    Quit date: 1995    Years since quitting: 26.9  . Smokeless tobacco: Never Used  . Tobacco comment: Quit 20 yrs ago  Vaping Use  . Vaping Use: Never used  Substance and Sexual Activity  . Alcohol use: No  Alcohol/week: 0.0 standard drinks  . Drug use: No  . Sexual activity: Yes    Birth control/protection: Post-menopausal, Other-see comments    Comment: Monogamous   Other Topics Concern  . Not on file  Social History Narrative   Single. Exercise: No. Education: College.   Social Determinants of Health   Financial Resource Strain:   . Difficulty of Paying Living Expenses: Not on file  Food Insecurity:   . Worried About Charity fundraiser in the Last Year: Not on file  . Ran Out of Food in the Last Year: Not on file  Transportation Needs:   . Lack of Transportation (Medical): Not on file  . Lack of Transportation (Non-Medical): Not on file  Physical Activity:   . Days of Exercise per Week: Not on file  . Minutes of Exercise per Session: Not on file  Stress:   . Feeling of Stress : Not on file  Social Connections:   . Frequency of Communication with Friends and Family: Not on file  . Frequency of Social Gatherings with Friends and Family: Not on file  . Attends Religious Services: Not on file  . Active Member of Clubs or Organizations: Not on  file  . Attends Archivist Meetings: Not on file  . Marital Status: Not on file  Intimate Partner Violence:   . Fear of Current or Ex-Partner: Not on file  . Emotionally Abused: Not on file  . Physically Abused: Not on file  . Sexually Abused: Not on file     Allergies  Allergen Reactions  . Aspirin     Sores in mouth  . Adhesive [Tape] Other (See Comments)    SKIN BLISTERS  . Penicillins     Swelling   . Red Yeast Rice Hives  . Sulfa Drugs Cross Reactors Hives     Prior to Admission medications   Medication Sig Start Date End Date Taking? Authorizing Provider  albuterol (VENTOLIN HFA) 108 (90 Base) MCG/ACT inhaler Inhale 1-2 puffs into the lungs every 6 (six) hours as needed for wheezing or shortness of breath. 04/17/19  Yes Sagardia, Ines Bloomer, MD  Alirocumab (PRALUENT) 75 MG/ML SOAJ Inject 75 mg into the skin every 14 (fourteen) days. 11/01/19  Yes End, Harrell Gave, MD  atorvastatin (LIPITOR) 80 MG tablet TAKE 1 TABLET BY MOUTH ONCE DAILY AT 6 PM 02/14/20  Yes Sagardia, Ines Bloomer, MD  Blood Glucose Monitoring Suppl (ACCU-CHEK GUIDE) w/Device KIT 1 Device by Does not apply route in the morning, at noon, and at bedtime. 02/26/20  Yes Sagardia, Ines Bloomer, MD  cetirizine (ZYRTEC) 10 MG tablet Take 10 mg by mouth daily.     Yes [provider]  citalopram (CELEXA) 20 MG tablet TAKE 1 TABLET BY MOUTH  DAILY 02/09/20  Yes Sagardia, Ines Bloomer, MD  clopidogrel (PLAVIX) 75 MG tablet Take 1 tablet (75 mg total) by mouth daily. 06/09/20  Yes Loel Dubonnet, NP  cyclobenzaprine (FLEXERIL) 10 MG tablet TAKE 1 TABLET BY MOUTH AT  BEDTIME AS NEEDED AND MAY  REPEAT DOSE ONE TIME IF  NEEDED FOR MUSCLE SPASMS  (CAREFUL OF SEDATION) 02/14/20  Yes Sagardia, Ines Bloomer, MD  Dulaglutide (TRULICITY) 0.92 ZR/0.0TM SOPN Inject 0.75 mg into the skin once a week. 10/30/19  Yes Sagardia, Ines Bloomer, MD  fluticasone [facility] 50 MCG/ACT nasal spray Place 2 sprays into both nostrils daily.  04/17/19  Yes Horald Pollen, MD  fluticasone (FLOVENT HFA) 110 MCG/ACT inhaler Inhale 2 puffs into  the lungs 2 (two) times daily. 04/17/19  Yes Sagardia, Ines Bloomer, MD  furosemide (LASIX) 20 MG tablet TAKE 1 TABLET BY MOUTH  DAILY AS NEEDED FOR EDEMA,  SWELLING 07/29/20  Yes End, Harrell Gave, MD  gabapentin (NEURONTIN) 300 MG capsule TAKE 2 CAPSULES BY MOUTH IN THE MORNING AND 3 CAPSULES  AT BEDTIME 06/06/19  Yes Sagardia, Ines Bloomer, MD  glimepiride (AMARYL) 4 MG tablet TAKE ONE-HALF TABLET BY  MOUTH TWICE DAILY 05/08/20  Yes Sagardia, Ines Bloomer, MD  glucose blood test strip Use as instructed 10/29/19  Yes Sagardia, Ines Bloomer, MD  hydrochlorothiazide (HYDRODIURIL) 25 MG tablet TAKE 1 TABLET BY MOUTH  DAILY 07/29/20  Yes End, Harrell Gave, MD  lidocaine (XYLOCAINE) 2 % solution Use as directed 5-10 mLs in the mouth or throat every 3 (three) hours as needed. 04/17/19  Yes Horald Pollen, MD  lisinopril (ZESTRIL) 40 MG tablet TAKE 1 TABLET BY MOUTH  DAILY 02/14/20  Yes Sagardia, Ines Bloomer, MD  lisinopril (ZESTRIL) 40 MG tablet SMARTSIG:1 Tablet(s) By Mouth Daily 04/10/20  Yes [provider]  metFORMIN (GLUCOPHAGE) 500 MG tablet TAKE 1 TABLET BY MOUTH  TWICE DAILY WITH A MEAL 05/08/20  Yes Sagardia, Ines Bloomer, MD  metoprolol succinate (TOPROL-XL) 50 MG 24 hr tablet TAKE 1 TABLET BY MOUTH  TWICE DAILY WITH OR  IMMEDIATELY FOLLOWING A  MEAL 05/19/20  Yes Sagardia, Ines Bloomer, MD  Multiple Vitamin (MULTIVITAMIN) tablet Take 1 tablet by mouth daily. 08/30/16  Yes Lars Pinks M, PA-C  nystatin cream (MYCOSTATIN) Apply 1 application topically as needed for dry skin. 04/28/20  Yes Sagardia, Ines Bloomer, MD  omeprazole (PRILOSEC) 20 MG capsule Take 1 capsule (20 mg total) by mouth daily. 12/15/16  Yes English, Colletta [place] D, PA  ondansetron (ZOFRAN-ODT) 8 MG disintegrating tablet DISSOLVE 1 ON TONGUE EVERY 12 HOURS AS NEEDED FOR NAUSEA 04/17/19  Yes Sagardia, Ines Bloomer, MD  polycarbophil  (FIBERCON) 625 MG tablet Take by mouth.   Yes [provider]  traMADol (ULTRAM) 50 MG tablet TAKE 1 TABLET BY MOUTH AT  BEDTIME AS NEEDED 02/09/20  Yes Sagardia, Ines Bloomer, MD  triamcinolone cream (KENALOG) 0.1 % Apply 1 application topically as needed. 04/17/19  Yes Horald Pollen, MD     Depression screen [facility] 2/9 09/01/2020 04/28/2020 10/29/2019 04/17/2019 11/25/2018  Decreased Interest 0 0 0 0 0  Down, Depressed, Hopeless 0 0 0 0 0  PHQ - 2 Score 0 0 0 0 0  Altered sleeping - - - - -  Tired, decreased energy - - - - -  Change in appetite - - - - -  Feeling bad or failure about yourself  - - - - -  Trouble concentrating - - - - -  Moving slowly or fidgety/restless - - - - -  Suicidal thoughts - - - - -  PHQ-9 Score - - - - -  Difficult doing work/chores - - - - -  Some recent data might be hidden     Fall Risk  09/01/2020 04/28/2020 10/29/2019 04/17/2019 11/25/2018  Falls in the past year? 0 0 0 0 0  Comment - - - - -  Number falls in past yr: 0 0 - - 0  Injury with Fall? 0 0 - - 0  Comment - - - - -  Follow up Falls evaluation completed;Education provided Falls evaluation completed Falls evaluation completed Falls evaluation completed Falls evaluation completed      PHYSICAL EXAM: BP 108/78 Comment:  taken from a previous visit/ not in clinic  Ht '5\' 2"'  (1.575 m)   Wt 221 lb (100.2 kg)   BMI 40.42 kg/m    Wt Readings from Last 3 Encounters:  09/01/20 221 lb (100.2 kg)  08/05/20 221 lb (100.2 kg)  06/09/20 224 lb (101.6 kg)      Education/Counseling provided regarding diet and exercise, prevention of chronic diseases, smoking/tobacco cessation, if applicable, and reviewed "Covered Medicare Preventive Services."

## 2020-09-12 ENCOUNTER — Ambulatory Visit (INDEPENDENT_AMBULATORY_CARE_PROVIDER_SITE_OTHER): Payer: Medicare Other

## 2020-09-12 DIAGNOSIS — I472 Ventricular tachycardia: Secondary | ICD-10-CM

## 2020-09-12 DIAGNOSIS — I4729 Other ventricular tachycardia: Secondary | ICD-10-CM

## 2020-09-13 LAB — CUP PACEART REMOTE DEVICE CHECK
Battery Impedance: 1316 Ohm
Battery Remaining Longevity: 61 mo
Battery Voltage: 2.76 V
Brady Statistic AP VP Percent: 0 %
Brady Statistic AP VS Percent: 0 %
Brady Statistic AS VP Percent: 0 %
Brady Statistic AS VS Percent: 100 %
Date Time Interrogation Session: 20211203104326
Implantable Lead Implant Date: 20091201
Implantable Lead Implant Date: 20091201
Implantable Lead Location: 753859
Implantable Lead Location: 753860
Implantable Lead Model: 5076
Implantable Lead Model: 5076
Implantable Pulse Generator Implant Date: 20091201
Lead Channel Impedance Value: 392 Ohm
Lead Channel Impedance Value: 517 Ohm
Lead Channel Pacing Threshold Amplitude: 0.5 V
Lead Channel Pacing Threshold Amplitude: 0.75 V
Lead Channel Pacing Threshold Pulse Width: 0.4 ms
Lead Channel Pacing Threshold Pulse Width: 0.4 ms
Lead Channel Setting Pacing Amplitude: 2 V
Lead Channel Setting Pacing Amplitude: 2.5 V
Lead Channel Setting Pacing Pulse Width: 0.4 ms
Lead Channel Setting Sensing Sensitivity: 4 mV

## 2020-09-17 ENCOUNTER — Other Ambulatory Visit: Payer: Self-pay | Admitting: Emergency Medicine

## 2020-09-17 DIAGNOSIS — G629 Polyneuropathy, unspecified: Secondary | ICD-10-CM

## 2020-09-17 DIAGNOSIS — Z9109 Other allergy status, other than to drugs and biological substances: Secondary | ICD-10-CM

## 2020-09-17 NOTE — Telephone Encounter (Signed)
Requested medication (s) are due for refill today:no  Requested medication (s) are on the active medication list: yes   Last refill:  05/03/2020  Future visit scheduled: yes   Notes to clinic:  this refill cannot be delegated   Requested Prescriptions  Pending Prescriptions Disp Refills   traMADol (ULTRAM) 50 MG tablet [Pharmacy Med Name: traMADol HCl 50 MG Oral Tablet] 30 tablet     Sig: TAKE 1 TABLET BY MOUTH AT  BEDTIME AS NEEDED      Not Delegated - Analgesics:  Opioid Agonists Failed - 09/17/2020 12:07 PM      Failed - This refill cannot be delegated      Failed - Urine Drug Screen completed in last 360 days      Passed - Valid encounter within last 6 months    Recent Outpatient Visits           2 weeks ago Medicare annual wellness visit, subsequent   [facility], [place], MD   4 months ago Type 2 diabetes mellitus with diabetic polyneuropathy, without long-term current use of insulin [facility])   [facility], [place], MD   10 months ago Type 2 diabetes mellitus with diabetic polyneuropathy, without long-term current use of insulin [facility])   [facility] at [place], [place], MD   1 year ago Coronary artery disease involving native coronary artery of native heart without angina pectoris   [facility] at [facility], Ines Bloomer, MD   1 year ago Acute sore throat   [facility], Lanelle Bal, DO       Future Appointments             In 1 month [place], Ines Bloomer, MD [facility] [place], [facility]   In 2 months End, Harrell Gave, MD [facility], [facility]             Signed Prescriptions Disp Refills   fluticasone (FLONASE) 50 MCG/ACT nasal spray 48 g 0    Sig: USE 2 SPRAYS IN BOTH  NOSTRILS DAILY      Ear, Nose, and Throat: Nasal Preparations - Corticosteroids Passed - 09/17/2020 12:07 PM      Passed - Valid encounter within last 12 months    Recent Outpatient Visits            2 weeks ago Medicare annual wellness visit, subsequent   [facility], [place], MD   4 months ago Type 2 diabetes mellitus with diabetic polyneuropathy, without long-term current use of insulin [facility])   [facility] at [place], [place], MD   10 months ago Type 2 diabetes mellitus with diabetic polyneuropathy, without long-term current use of insulin [facility])   [facility] at [place], [place], MD   1 year ago Coronary artery disease involving native coronary artery of native heart without angina pectoris   [facility] at [place], Ines Bloomer, MD   1 year ago Acute sore throat   [facility], Lanelle Bal, DO       Future Appointments             In 1 month Sagardia, Ines Bloomer, MD [facility] at [place], [facility]   In 2 months End, Harrell Gave, MD [facility], [place]

## 2020-09-23 NOTE — Progress Notes (Signed)
Remote pacemaker transmission.   

## 2020-09-28 DIAGNOSIS — G4733 Obstructive sleep apnea (adult) (pediatric): Secondary | ICD-10-CM | POA: Diagnosis not present

## 2020-10-08 ENCOUNTER — Telehealth: Payer: Self-pay | Admitting: Emergency Medicine

## 2020-10-08 NOTE — Telephone Encounter (Signed)
Once provider has reviewed and filled out paper work the pt will be contacted. Pt will also be contacted if additional information is needed.

## 2020-10-08 NOTE — Telephone Encounter (Signed)
Came into the office and dropped off paperwork that is a copy of the grant for trulicity shot for 2022. Pt is needing providers signature on this paperwork. Paperwork is going to be left in providers [employer] in providers lounge. Please advise.

## 2020-10-15 ENCOUNTER — Telehealth: Payer: Self-pay | Admitting: *Deleted

## 2020-10-15 ENCOUNTER — Other Ambulatory Visit: Payer: Self-pay | Admitting: Emergency Medicine

## 2020-10-15 NOTE — Telephone Encounter (Signed)
Faxed completed application for Patient Assistance Program for Rx Trulicity. Confirmation at 12:16 pm.

## 2020-10-25 ENCOUNTER — Other Ambulatory Visit: Payer: Self-pay | Admitting: Emergency Medicine

## 2020-10-25 DIAGNOSIS — E1142 Type 2 diabetes mellitus with diabetic polyneuropathy: Secondary | ICD-10-CM

## 2020-10-28 ENCOUNTER — Ambulatory Visit: Payer: Medicare Other | Admitting: Emergency Medicine

## 2020-10-29 DIAGNOSIS — G4733 Obstructive sleep apnea (adult) (pediatric): Secondary | ICD-10-CM | POA: Diagnosis not present

## 2020-10-30 ENCOUNTER — Other Ambulatory Visit: Payer: Self-pay

## 2020-10-30 ENCOUNTER — Ambulatory Visit (INDEPENDENT_AMBULATORY_CARE_PROVIDER_SITE_OTHER): Payer: Medicare Other | Admitting: Emergency Medicine

## 2020-10-30 ENCOUNTER — Encounter: Payer: Self-pay | Admitting: Emergency Medicine

## 2020-10-30 VITALS — BP 114/75 | HR 70 | Temp 97.8°F | Resp 16 | Ht 62.0 in | Wt 216.0 lb

## 2020-10-30 DIAGNOSIS — I5032 Chronic diastolic (congestive) heart failure: Secondary | ICD-10-CM

## 2020-10-30 DIAGNOSIS — I110 Hypertensive heart disease with heart failure: Secondary | ICD-10-CM

## 2020-10-30 DIAGNOSIS — I495 Sick sinus syndrome: Secondary | ICD-10-CM | POA: Diagnosis not present

## 2020-10-30 DIAGNOSIS — E1159 Type 2 diabetes mellitus with other circulatory complications: Secondary | ICD-10-CM | POA: Diagnosis not present

## 2020-10-30 DIAGNOSIS — I152 Hypertension secondary to endocrine disorders: Secondary | ICD-10-CM

## 2020-10-30 DIAGNOSIS — I25118 Atherosclerotic heart disease of native coronary artery with other forms of angina pectoris: Secondary | ICD-10-CM | POA: Diagnosis not present

## 2020-10-30 DIAGNOSIS — E1151 Type 2 diabetes mellitus with diabetic peripheral angiopathy without gangrene: Secondary | ICD-10-CM

## 2020-10-30 DIAGNOSIS — Z6839 Body mass index (BMI) 39.0-39.9, adult: Secondary | ICD-10-CM

## 2020-10-30 DIAGNOSIS — E1169 Type 2 diabetes mellitus with other specified complication: Secondary | ICD-10-CM

## 2020-10-30 DIAGNOSIS — E1142 Type 2 diabetes mellitus with diabetic polyneuropathy: Secondary | ICD-10-CM

## 2020-10-30 DIAGNOSIS — E669 Obesity, unspecified: Secondary | ICD-10-CM

## 2020-10-30 LAB — COMPREHENSIVE METABOLIC PANEL
ALT: 19 IU/L (ref 0–32)
AST: 23 IU/L (ref 0–40)
Albumin/Globulin Ratio: 1.8 (ref 1.2–2.2)
Albumin: 4.7 g/dL (ref 3.8–4.8)
Alkaline Phosphatase: 86 IU/L (ref 44–121)
BUN/Creatinine Ratio: 25 (ref 12–28)
BUN: 24 mg/dL (ref 8–27)
Bilirubin Total: 0.5 mg/dL (ref 0.0–1.2)
CO2: 24 mmol/L (ref 20–29)
Calcium: 9.9 mg/dL (ref 8.7–10.3)
Chloride: 101 mmol/L (ref 96–106)
Creatinine, Ser: 0.95 mg/dL (ref 0.57–1.00)
GFR calc Af Amer: 72 mL/min/{1.73_m2} (ref >59)
GFR calc non Af Amer: 63 mL/min/{1.73_m2} (ref >59)
Globulin, Total: 2.6 g/dL (ref 1.5–4.5)
Glucose: 128 mg/dL — ABNORMAL HIGH (ref 65–99)
Potassium: 4.5 mmol/L (ref 3.5–5.2)
Sodium: 140 mmol/L (ref 134–144)
Total Protein: 7.3 g/dL (ref 6.0–8.5)

## 2020-10-30 LAB — LIPID PANEL
Chol/HDL Ratio: 3.2 ratio (ref 0.0–4.4)
Cholesterol, Total: 129 mg/dL (ref 100–199)
HDL: 40 mg/dL (ref >39)
LDL Chol Calc (NIH): 45 mg/dL (ref 0–99)
Triglycerides: 288 mg/dL — ABNORMAL HIGH (ref 0–149)
VLDL Cholesterol Cal: 44 mg/dL — ABNORMAL HIGH (ref 5–40)

## 2020-10-30 LAB — POCT GLYCOSYLATED HEMOGLOBIN (HGB A1C): Hemoglobin A1C: 6.7 % — AB (ref 4.0–5.6)

## 2020-10-30 LAB — GLUCOSE, POCT (MANUAL RESULT ENTRY): POC Glucose: 127 mg/dl — AB (ref 70–99)

## 2020-10-30 NOTE — Assessment & Plan Note (Signed)
Well-controlled hypertension.  Continue present medications.  No changes. Well-controlled diabetes with hemoglobin A1c of 6.7.  Continue present medications no changes. Follow-up in 6 months.

## 2020-10-30 NOTE — Progress Notes (Addendum)
[name] [name] 65 y.o.   Chief Complaint  Patient presents with  . Diabetes    Follow up 6 month    HISTORY OF PRESENT ILLNESS: This is a 65 y.o. female with history of diabetes and hypertension here for follow-up. Doing well.  Has no complaints or medical concerns today. Fully vaccinated against COVID with a booster.  HPI   Prior to Admission medications   Medication Sig Start Date End Date Taking? Authorizing Provider  albuterol (VENTOLIN HFA) 108 (90 Base) MCG/ACT inhaler Inhale 1-2 puffs into the lungs every 6 (six) hours as needed for wheezing or shortness of breath. 04/17/19  Yes Sagardia, Ines Bloomer, MD  Alirocumab (PRALUENT) 75 MG/ML SOAJ Inject 75 mg into the skin every 14 (fourteen) days. 11/01/19  Yes End, Harrell Gave, MD  atorvastatin (LIPITOR) 80 MG tablet TAKE 1 TABLET BY MOUTH ONCE DAILY AT 6 PM 02/14/20  Yes Sagardia, Ines Bloomer, MD  cetirizine (ZYRTEC) 10 MG tablet Take 10 mg by mouth daily.   Yes [provider]  citalopram (CELEXA) 20 MG tablet TAKE 1 TABLET BY MOUTH  DAILY 02/09/20  Yes Sagardia, Ines Bloomer, MD  clopidogrel (PLAVIX) 75 MG tablet Take 1 tablet (75 mg total) by mouth daily. 06/09/20  Yes Loel Dubonnet, NP  cyclobenzaprine (FLEXERIL) 10 MG tablet TAKE 1 TABLET BY MOUTH AT  BEDTIME AS NEEDED AND MAY  REPEAT DOSE ONE TIME IF  NEEDED FOR MUSCLE SPASMS  (CAREFUL OF SEDATION) 02/14/20  Yes Sagardia, Ines Bloomer, MD  Dulaglutide (TRULICITY) 2.72 ZD/6.6YQ SOPN Inject 0.75 mg into the skin once a week. 10/30/19  Yes Sagardia, Ines Bloomer, MD  fluticasone [facility]) 50 MCG/ACT nasal spray USE 2 SPRAYS IN BOTH  NOSTRILS DAILY 09/17/20  Yes Sagardia, Ines Bloomer, MD  fluticasone (FLOVENT HFA) 110 MCG/ACT inhaler Inhale 2 puffs into the lungs 2 (two) times daily. 04/17/19  Yes Sagardia, Ines Bloomer, MD  furosemide (LASIX) 20 MG tablet TAKE 1 TABLET BY MOUTH  DAILY AS NEEDED FOR EDEMA,  SWELLING 07/29/20  Yes End, Harrell Gave, MD  gabapentin (NEURONTIN) 300 MG  capsule TAKE 2 CAPSULES BY MOUTH IN THE MORNING AND 3 CAPSULES  AT BEDTIME 06/06/19  Yes Sagardia, Ines Bloomer, MD  glimepiride (AMARYL) 4 MG tablet TAKE ONE-HALF TABLET BY  MOUTH TWICE DAILY 10/25/20  Yes Sagardia, Ines Bloomer, MD  hydrochlorothiazide (HYDRODIURIL) 25 MG tablet TAKE 1 TABLET BY MOUTH  DAILY 07/29/20  Yes End, Harrell Gave, MD  lidocaine (XYLOCAINE) 2 % solution Use as directed 5-10 mLs in the mouth or throat every 3 (three) hours as needed. 04/17/19  Yes Horald Pollen, MD  lisinopril (ZESTRIL) 40 MG tablet TAKE 1 TABLET BY MOUTH  DAILY 02/14/20  Yes Horald Pollen, MD  metFORMIN (GLUCOPHAGE) 500 MG tablet TAKE 1 TABLET BY MOUTH  TWICE DAILY WITH A MEAL 10/25/20  Yes Sagardia, Ines Bloomer, MD  metoprolol succinate (TOPROL-XL) 50 MG 24 hr tablet TAKE 1 TABLET BY MOUTH  TWICE DAILY WITH OR  IMMEDIATELY FOLLOWING A  MEAL 05/19/20  Yes Sagardia, Ines Bloomer, MD  Multiple Vitamin (MULTIVITAMIN) tablet Take 1 tablet by mouth daily. 08/30/16  Yes Lars Pinks M, PA-C  nystatin cream (MYCOSTATIN) Apply 1 application topically as needed for dry skin. 04/28/20  Yes Sagardia, Ines Bloomer, MD  omeprazole (PRILOSEC) 20 MG capsule Take 1 capsule (20 mg total) by mouth daily. 12/15/16  Yes English, Colletta Maryland D, PA  ondansetron (ZOFRAN-ODT) 8 MG disintegrating tablet DISSOLVE 1 ON TONGUE EVERY 12 HOURS AS  NEEDED FOR NAUSEA 04/17/19  Yes Sagardia, Ines Bloomer, MD  polycarbophil (FIBERCON) 625 MG tablet Take by mouth.   Yes [provider]  traMADol (ULTRAM) 50 MG tablet TAKE 1 TABLET BY MOUTH AT  BEDTIME AS NEEDED 09/17/20  Yes Sagardia, Ines Bloomer, MD  triamcinolone cream (KENALOG) 0.1 % Apply 1 application topically as needed. 04/17/19  Yes Sagardia, Ines Bloomer, MD  Blood Glucose Monitoring Suppl (ACCU-CHEK GUIDE) w/Device KIT 1 Device by Does not apply route in the morning, at noon, and at bedtime. 02/26/20   Horald Pollen, MD  glucose blood test strip Use as instructed  10/29/19   Horald Pollen, MD  lisinopril (ZESTRIL) 40 MG tablet SMARTSIG:1 Tablet(s) By Mouth Daily 04/10/20   [provider]    Allergies  Allergen Reactions  . Aspirin     Sores in mouth  . Adhesive [Tape] Other (See Comments)    SKIN BLISTERS  . Penicillins     Swelling   . Red Yeast Rice Hives  . Sulfa Drugs Cross Reactors Hives    Patient Active Problem List   Diagnosis Date Noted  . Trigger finger, right middle finger 11/07/2019  . Hypertension associated with diabetes ([place]) 10/29/2019  . Chronic heart failure with preserved ejection fraction (HFpEF) ([place]) 05/25/2019  . Coronary artery disease involving native coronary artery of native heart without angina pectoris 02/14/2018  . Morbid obesity with body mass index (BMI) of 40.0 to 44.9 in adult ([place]) 02/14/2018  . Irritable bowel syndrome 06/28/2017  . S/P CABG x 3 07/22/2016  . Multiple vessel coronary artery disease 07/21/2016  . STEMI involving left circumflex coronary artery ([place]) 07/20/2016  . Asthmatic bronchitis 04/26/2016  . Allergic urticaria 04/26/2016  . Environmental allergies 04/26/2016  . Anxiety 04/26/2016  . Sinus node dysfunction ([place]) 04/26/2016  . Pacemaker Medtronic   . Ventricular tachycardia, nonsustained-during exercise testing 03/07/2013  . OSA (obstructive sleep apnea) 05/25/2011  . Hx of adenomatous colonic polyps 05/25/2011  . Migraine 05/25/2011  . Diabetes mellitus type 2, uncontrolled, with complications ([place]) 44/10/270  . GERD (gastroesophageal reflux disease) 05/25/2011  . NAFLD (nonalcoholic fatty liver disease) 05/25/2011  . Hyperlipidemia LDL goal <70 05/25/2011  . Essential hypertension 09/16/2009  . DIASTOLIC HEART FAILURE, CHRONIC 09/16/2009    Past Medical History:  Diagnosis Date  . Allergy   . Anal fissure   . Anxiety   . Arthritis   . Asthma   . Cataract 2010    left  . Colon polyps    diverticulosis  03-2011/2005  . Depression   . Diabetes  mellitus 2012   T2DM  . Diverticulosis   . Fatty liver 04/2011  . GERD (gastroesophageal reflux disease)   . Hyperlipidemia   . Hypertension severe   . Migraine   . Obesity   . OSA (obstructive sleep apnea)    C-Pap  . Pacemaker Medtronic   . Sinoatrial node dysfunction (HCC)    syncope    Past Surgical History:  Procedure Laterality Date  . ABDOMINAL HYSTERECTOMY  2000   total  . CARDIAC CATHETERIZATION N/A 07/20/2016   Procedure: Left Heart Cath and Coronary Angiography;  Surgeon: Peter M Martinique, MD;  Location: [facility];  Service: Cardiovascular;  Laterality: N/A;  . CATARACT EXTRACTION Left   . CORONARY ARTERY BYPASS GRAFT N/A 07/22/2016   Procedure: CORONARY ARTERY BYPASS GRAFTING (CABG) x three , using left internal mammary artery and right leg greater saphenous vein harvested endoscopically;  Surgeon:  Ivin Poot, MD;  Location: [facility];  Service: Open Heart Surgery;  Laterality: N/A;  . EYE SURGERY Right 2010  . OOPHORECTOMY    . PACEMAKER PLACEMENT    . T/A right knee    . TEE WITHOUT CARDIOVERSION N/A 07/22/2016   Procedure: TRANSESOPHAGEAL ECHOCARDIOGRAM (TEE);  Surgeon: Ivin Poot, MD;  Location: [facility];  Service: Open Heart Surgery;  Laterality: N/A;  . TONSILLECTOMY      Social History   Socioeconomic History  . Marital status: Single    Spouse name: Not on file  . Number of children: 0  . Years of education: Not on file  . Highest education level: Not on file  Occupational History  . Occupation: Surveyor, quantity: [employer]  Tobacco Use  . Smoking status: Former Smoker    Types: Cigarettes    Quit date: 1995    Years since quitting: 27.0  . Smokeless tobacco: Never Used  . Tobacco comment: Quit 20 yrs ago  Vaping Use  . Vaping Use: Never used  Substance and Sexual Activity  . Alcohol use: No    Alcohol/week: 0.0 standard drinks  . Drug use: No  . Sexual activity: Yes    Birth control/protection: Post-menopausal,  Other-see comments    Comment: Monogamous   Other Topics Concern  . Not on file  Social History Narrative   Single. Exercise: No. Education: College.   Social Determinants of Health   Financial Resource Strain: Not on file  Food Insecurity: Not on file  Transportation Needs: Not on file  Physical Activity: Not on file  Stress: Not on file  Social Connections: Not on file  Intimate Partner Violence: Not on file    Family History  Problem Relation Age of Onset  . Heart disease Mother   . Cancer Mother 7       lung  . Breast cancer Mother        maternal grandmother  . Diabetes Sister        type 2  . Breast cancer Brother   . Cancer Maternal Grandmother        breast  . Leukemia Paternal Grandmother        neoplast  . Aneurysm Paternal Grandfather        abdominal (stomach)  . Heart disease Father   . Colon cancer Neg Hx      Review of Systems  Constitutional: Negative.  Negative for chills, fever and weight loss.  HENT: Negative.  Negative for congestion and sore throat.   Respiratory: Negative.  Negative for cough and shortness of breath.   Cardiovascular: Negative.  Negative for chest pain and palpitations.  Gastrointestinal: Negative.  Negative for abdominal pain, diarrhea, nausea and vomiting.  Genitourinary: Negative.  Negative for dysuria and hematuria.  Musculoskeletal: Negative.  Negative for back pain, myalgias and neck pain.  Skin: Negative.  Negative for rash.  Neurological: Negative.  Negative for dizziness and headaches.  All other systems reviewed and are negative.   Today's Vitals   10/30/20 1112  BP: 114/75  Pulse: 70  Resp: 16  Temp: 97.8 F (36.6 C)  TempSrc: Temporal  SpO2: 95%  Weight: 216 lb (98 kg)  Height: 5' 2" (1.575 m)   Body mass index is 39.51 kg/m. Wt Readings from Last 3 Encounters:  10/30/20 216 lb (98 kg)  09/01/20 221 lb (100.2 kg)  08/05/20 221 lb (100.2 kg)    Physical Exam Vitals reviewed.  Constitutional:       Appearance: Normal appearance.  HENT:     Head: Normocephalic.  Eyes:     Extraocular Movements: Extraocular movements intact.     Pupils: Pupils are equal, round, and reactive to light.  Cardiovascular:     Rate and Rhythm: Normal rate and regular rhythm.     Pulses: Normal pulses.     Heart sounds: Normal heart sounds.  Pulmonary:     Effort: Pulmonary effort is normal.     Breath sounds: Normal breath sounds.  Musculoskeletal:        General: Normal range of motion.     Cervical back: Normal range of motion and neck supple.     Right lower leg: No edema.     Left lower leg: No edema.  Skin:    General: Skin is warm and dry.     Capillary Refill: Capillary refill takes less than 2 seconds.  Neurological:     General: No focal deficit present.     Mental Status: She is alert and oriented to person, place, and time.  Psychiatric:        Mood and Affect: Mood normal.        Behavior: Behavior normal.      Results for orders placed or performed in visit on 10/30/20 (from the past 24 hour(s))  POCT glucose (manual entry)     Status: Abnormal   Collection Time: 10/30/20 11:39 AM  Result Value Ref Range   POC Glucose 127 (A) 70 - 99 mg/dl  POCT glycosylated hemoglobin (Hb A1C)     Status: Abnormal   Collection Time: 10/30/20 11:45 AM  Result Value Ref Range   Hemoglobin A1C 6.7 (A) 4.0 - 5.6 %   HbA1c POC (<> result, manual entry)     HbA1c, POC (prediabetic range)     HbA1c, POC (controlled diabetic range)       ASSESSMENT & PLAN: Hypertension associated with diabetes ([place]) Well-controlled hypertension.  Continue present medications.  No changes. Well-controlled diabetes with hemoglobin A1c of 6.7.  Continue present medications no changes. Follow-up in 6 months.  Manahil was seen today for diabetes.  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  If you have not heard from [place] then please contact us. The fastest way to get your results is to register for My Chart.   IF you received an x-ray today, you will receive an invoice from [facility]. Please contact [facility] at [phone] with questions or concerns regarding your invoice.   IF you received labwork today, you will receive an invoice from [facility]. Please contact [facility] at [phone] with questions or concerns regarding your invoice.   Our billing staff  will not be able to assist you with questions regarding bills from these companies.  You will be contacted with the lab results as soon as they are available. The fastest way to get your results is to activate your My Chart account. Instructions are located on the last page of this paperwork. If you have not heard from [place] regarding the results in 2 weeks, please contact this office.     Health Maintenance After Age 27 After age 63, you are at a higher risk for certain long-term diseases and infections as well as injuries from falls. Falls are a major cause of broken bones and head injuries in people who are older than age 1. Getting regular preventive care can help to keep you healthy and well. Preventive care includes getting regular testing and making lifestyle changes as recommended by your  health care provider. Talk with your health care provider about:  Which screenings and tests you should have. A screening is a test that checks for a disease when you have no symptoms.  A diet and exercise plan that is right for you. What should I know about screenings and tests to prevent falls? Screening and testing are the best ways to find a health problem early. Early diagnosis and treatment give you the best chance of managing medical conditions that are common after age 74. Certain conditions and lifestyle choices may make you more likely to have a fall. Your health care provider may recommend:  Regular vision checks. Poor vision and conditions such as cataracts can make you more likely to have a fall. If you wear glasses, make sure to get your prescription updated if your vision changes.  Medicine review. Work with your health care provider to regularly review all of the medicines you are taking, including over-the-counter medicines. Ask your health care provider about any side effects that may make you more likely to have a fall. Tell your health care provider if any medicines that you take make you feel dizzy or sleepy.  Osteoporosis screening. Osteoporosis is a condition that causes the bones to get weaker. This can make the bones weak and cause them to break more easily.  Blood pressure screening. Blood pressure changes and medicines to control blood pressure can make you feel dizzy.  Strength and balance checks. Your health care provider may recommend certain tests to check your strength and balance while standing, walking, or changing positions.  Foot health exam. Foot pain and numbness, as well as not wearing proper footwear, can make you more likely to have a fall.  Depression screening. You may be more likely to have a fall if you have a fear of falling, feel emotionally low, or feel unable to do activities that you used to do.  Alcohol use screening. Using too much alcohol can  affect your balance and may make you more likely to have a fall. What actions can I take to lower my risk of falls? General instructions  Talk with your health care provider about your risks for falling. Tell your health care provider if: ? You fall. Be sure to tell your health care provider about all falls, even ones that seem minor. ? You feel dizzy, sleepy, or off-balance.  Take over-the-counter and prescription medicines only as told by your health care provider. These include any supplements.  Eat a healthy diet and maintain a healthy weight. A healthy diet includes low-fat dairy products, low-fat (lean) meats, and fiber from whole grains, beans, and lots of  fruits and vegetables. Home safety  Remove any tripping hazards, such as rugs, cords, and clutter.  Install safety equipment such as grab bars in bathrooms and safety rails on stairs.  Keep rooms and walkways well-lit. Activity  Follow a regular exercise program to stay fit. This will help you maintain your balance. Ask your health care provider what types of exercise are appropriate for you.  If you need a cane or walker, use it as recommended by your health care provider.  Wear supportive shoes that have nonskid soles.   Lifestyle  Do not drink alcohol if your health care provider tells you not to drink.  If you drink alcohol, limit how much you have: ? 0-1 drink a day for women. ? 0-2 drinks a day for men.  Be aware of how much alcohol is in your drink. In the [place] one drink equals one typical bottle of beer (12 oz), one-half glass of wine (5 oz), or one shot of hard liquor (1 oz).  Do not use any products that contain nicotine or tobacco, such as cigarettes and e-cigarettes. If you need help quitting, ask your health care provider. Summary  Having a healthy lifestyle and getting preventive care can help to protect your health and wellness after age 66.  Screening and testing are the best way to find a health  problem early and help you avoid having a fall. Early diagnosis and treatment give you the best chance for managing medical conditions that are more common for people who are older than age 43.  Falls are a major cause of broken bones and head injuries in people who are older than age 29. Take precautions to prevent a fall at home.  Work with your health care provider to learn what changes you can make to improve your health and wellness and to prevent falls. This information is not intended to replace advice given to you by your health care provider. Make sure you discuss any questions you have with your health care provider. Document Revised: 01/18/2019 Document Reviewed: 08/10/2017 Elsevier Patient Education  2021 Elsevier Inc.      Agustina Caroli, MD [facility]

## 2020-10-30 NOTE — Patient Instructions (Addendum)
   If you have lab work done today you will be contacted with your lab results within the next 2 weeks.  If you have not heard from us then please contact us. The fastest way to get your results is to register for My Chart.   IF you received an x-ray today, you will receive an invoice from [facility]. Please contact [facility] at [phone] with questions or concerns regarding your invoice.   IF you received labwork today, you will receive an invoice from [facility]. Please contact [facility] at [phone] with questions or concerns regarding your invoice.   Our billing staff will not be able to assist you with questions regarding bills from these companies.  You will be contacted with the lab results as soon as they are available. The fastest way to get your results is to activate your My Chart account. Instructions are located on the last page of this paperwork. If you have not heard from us regarding the results in 2 weeks, please contact this office.       Health Maintenance After Age 65 After age 65, you are at a higher risk for certain long-term diseases and infections as well as injuries from falls. Falls are a major cause of broken bones and head injuries in people who are older than age 65. Getting regular preventive care can help to keep you healthy and well. Preventive care includes getting regular testing and making lifestyle changes as recommended by your health care provider. Talk with your health care provider about:  Which screenings and tests you should have. A screening is a test that checks for a disease when you have no symptoms.  A diet and exercise plan that is right for you. What should I know about screenings and tests to prevent falls? Screening and testing are the best ways to find a health problem early. Early diagnosis and treatment give you the best chance of managing medical conditions that are common after age 65. Certain conditions and  lifestyle choices may make you more likely to have a fall. Your health care provider may recommend:  Regular vision checks. Poor vision and conditions such as cataracts can make you more likely to have a fall. If you wear glasses, make sure to get your prescription updated if your vision changes.  Medicine review. Work with your health care provider to regularly review all of the medicines you are taking, including over-the-counter medicines. Ask your health care provider about any side effects that may make you more likely to have a fall. Tell your health care provider if any medicines that you take make you feel dizzy or sleepy.  Osteoporosis screening. Osteoporosis is a condition that causes the bones to get weaker. This can make the bones weak and cause them to break more easily.  Blood pressure screening. Blood pressure changes and medicines to control blood pressure can make you feel dizzy.  Strength and balance checks. Your health care provider may recommend certain tests to check your strength and balance while standing, walking, or changing positions.  Foot health exam. Foot pain and numbness, as well as not wearing proper footwear, can make you more likely to have a fall.  Depression screening. You may be more likely to have a fall if you have a fear of falling, feel emotionally low, or feel unable to do activities that you used to do.  Alcohol use screening. Using too much alcohol can affect your balance and may make you more   likely to have a fall. What actions can I take to lower my risk of falls? General instructions  Talk with your health care provider about your risks for falling. Tell your health care provider if: ? You fall. Be sure to tell your health care provider about all falls, even ones that seem minor. ? You feel dizzy, sleepy, or off-balance.  Take over-the-counter and prescription medicines only as told by your health care provider. These include any  supplements.  Eat a healthy diet and maintain a healthy weight. A healthy diet includes low-fat dairy products, low-fat (lean) meats, and fiber from whole grains, beans, and lots of fruits and vegetables. Home safety  Remove any tripping hazards, such as rugs, cords, and clutter.  Install safety equipment such as grab bars in bathrooms and safety rails on stairs.  Keep rooms and walkways well-lit. Activity  Follow a regular exercise program to stay fit. This will help you maintain your balance. Ask your health care provider what types of exercise are appropriate for you.  If you need a cane or walker, use it as recommended by your health care provider.  Wear supportive shoes that have nonskid soles.   Lifestyle  Do not drink alcohol if your health care provider tells you not to drink.  If you drink alcohol, limit how much you have: ? 0-1 drink a day for women. ? 0-2 drinks a day for men.  Be aware of how much alcohol is in your drink. In the [place] one drink equals one typical bottle of beer (12 oz), one-half glass of wine (5 oz), or one shot of hard liquor (1 oz).  Do not use any products that contain nicotine or tobacco, such as cigarettes and e-cigarettes. If you need help quitting, ask your health care provider. Summary  Having a healthy lifestyle and getting preventive care can help to protect your health and wellness after age 65.  Screening and testing are the best way to find a health problem early and help you avoid having a fall. Early diagnosis and treatment give you the best chance for managing medical conditions that are more common for people who are older than age 65.  Falls are a major cause of broken bones and head injuries in people who are older than age 65. Take precautions to prevent a fall at home.  Work with your health care provider to learn what changes you can make to improve your health and wellness and to prevent falls. This information is not intended  to replace advice given to you by your health care provider. Make sure you discuss any questions you have with your health care provider. Document Revised: 01/18/2019 Document Reviewed: 08/10/2017 Elsevier Patient Education  2021 [employer]vier Inc.  

## 2020-11-06 ENCOUNTER — Other Ambulatory Visit: Payer: Self-pay | Admitting: Internal Medicine

## 2020-11-06 ENCOUNTER — Telehealth: Payer: Self-pay | Admitting: Emergency Medicine

## 2020-11-06 NOTE — Telephone Encounter (Signed)
FYI: So you are aware you have this paperwork, thank you!

## 2020-11-06 NOTE — Telephone Encounter (Signed)
Pt came into office to drop off [employer] paperwork to be sign by provider. Paperwork will be in providers RED box in providers lounge. Please advise.

## 2020-11-10 ENCOUNTER — Other Ambulatory Visit: Payer: Self-pay | Admitting: Emergency Medicine

## 2020-11-10 ENCOUNTER — Telehealth: Payer: Self-pay | Admitting: *Deleted

## 2020-11-10 NOTE — Telephone Encounter (Signed)
Spoke to patient concerning forms ([employer]), patient states she received the pharmacy card and medication. She does not need to complete them just shred them.

## 2020-11-12 ENCOUNTER — Telehealth: Payer: Self-pay | Admitting: *Deleted

## 2020-11-12 NOTE — Telephone Encounter (Signed)
On 11/10/2020, faxed completed document to [facility] with other supporting forms. Confirmation 5:43 pm.

## 2020-11-12 NOTE — Telephone Encounter (Signed)
Entered in error

## 2020-12-04 DIAGNOSIS — G4733 Obstructive sleep apnea (adult) (pediatric): Secondary | ICD-10-CM | POA: Diagnosis not present

## 2020-12-10 ENCOUNTER — Ambulatory Visit: Payer: Medicare Other | Admitting: Internal Medicine

## 2020-12-10 ENCOUNTER — Other Ambulatory Visit: Payer: Self-pay

## 2020-12-10 ENCOUNTER — Encounter: Payer: Self-pay | Admitting: Internal Medicine

## 2020-12-10 VITALS — BP 110/80 | HR 69 | Ht 62.0 in | Wt 220.2 lb

## 2020-12-10 DIAGNOSIS — I495 Sick sinus syndrome: Secondary | ICD-10-CM

## 2020-12-10 DIAGNOSIS — E1169 Type 2 diabetes mellitus with other specified complication: Secondary | ICD-10-CM | POA: Diagnosis not present

## 2020-12-10 DIAGNOSIS — I10 Essential (primary) hypertension: Secondary | ICD-10-CM

## 2020-12-10 DIAGNOSIS — I5032 Chronic diastolic (congestive) heart failure: Secondary | ICD-10-CM | POA: Diagnosis not present

## 2020-12-10 DIAGNOSIS — E785 Hyperlipidemia, unspecified: Secondary | ICD-10-CM

## 2020-12-10 DIAGNOSIS — I2510 Atherosclerotic heart disease of native coronary artery without angina pectoris: Secondary | ICD-10-CM | POA: Diagnosis not present

## 2020-12-10 NOTE — Progress Notes (Signed)
Follow-up Outpatient Visit Date: 12/10/2020  Primary Care Provider: Horald Pollen, MD [place] [address]  Chief Complaint: Follow-up coronary artery disease and HFpEF  HPI:  [name] is a 65 y.o. female with history of coronary artery disease status post CABG (07/2016) with postoperative atrial fibrillation,chronic HFpEF,hypertension, hyperlipidemia,HFpEF,sinus node dysfunction status post permanent pacemaker, and sleep apnea on CPAP, who presents for follow-up of coronary artery disease and HFpEF. She was last seen in our office by Laurann Montana, NP, in 05/2020. She was feeling well at that time. She was also doing well at the time of EP evaluation in 07/2020 with Dr. Caryl Comes.  Today, [name] reports that she continues to do well, denying chest pain, shortness of breath, palpitations, and lightheadedness.  Hit her leg swelling is well controlled with every other day furosemide.  Her weight has been stable.  She is trying to walk a little bit more.  --------------------------------------------------------------------------------------------------  Past Medical History:  Diagnosis Date  . Allergy   . Anal fissure   . Anxiety   . Arthritis   . Asthma   . Cataract 2010    left  . Colon polyps    diverticulosis  03-2011/2005  . Depression   . Diabetes mellitus 2012   T2DM  . Diverticulosis   . Fatty liver 04/2011  . GERD (gastroesophageal reflux disease)   . Hyperlipidemia   . Hypertension severe   . Migraine   . Obesity   . OSA (obstructive sleep apnea)    C-Pap  . Pacemaker Medtronic   . Sinoatrial node dysfunction (HCC)    syncope   Past Surgical History:  Procedure Laterality Date  . ABDOMINAL HYSTERECTOMY  2000   total  . CARDIAC CATHETERIZATION N/A 07/20/2016   Procedure: Left Heart Cath and Coronary Angiography;  Surgeon: Peter M Martinique, MD;  Location: [facility];  Service: Cardiovascular;  Laterality: N/A;  . CATARACT EXTRACTION  Left   . CORONARY ARTERY BYPASS GRAFT N/A 07/22/2016   Procedure: CORONARY ARTERY BYPASS GRAFTING (CABG) x three , using left internal mammary artery and right leg greater saphenous vein harvested endoscopically;  Surgeon: Ivin Poot, MD;  Location: [facility];  Service: Open Heart Surgery;  Laterality: N/A;  . EYE SURGERY Right 2010  . OOPHORECTOMY    . PACEMAKER PLACEMENT    . T/A right knee    . TEE WITHOUT CARDIOVERSION N/A 07/22/2016   Procedure: TRANSESOPHAGEAL ECHOCARDIOGRAM (TEE);  Surgeon: Ivin Poot, MD;  Location: [facility];  Service: Open Heart Surgery;  Laterality: N/A;  . TONSILLECTOMY      Current Meds  Medication Sig  . albuterol (VENTOLIN HFA) 108 (90 Base) MCG/ACT inhaler Inhale 1-2 puffs into the lungs every 6 (six) hours as needed for wheezing or shortness of breath.  Marland Kitchen atorvastatin (LIPITOR) 80 MG tablet TAKE 1 TABLET BY MOUTH ONCE DAILY AT 6 PM  . Blood Glucose Monitoring Suppl (ACCU-CHEK GUIDE) w/Device KIT 1 Device by Does not apply route in the morning, at noon, and at bedtime.  . cetirizine (ZYRTEC) 10 MG tablet Take 10 mg by mouth daily.  . citalopram (CELEXA) 20 MG tablet TAKE 1 TABLET BY MOUTH  DAILY  . clopidogrel (PLAVIX) 75 MG tablet Take 1 tablet (75 mg total) by mouth daily.  . cyclobenzaprine (FLEXERIL) 10 MG tablet TAKE 1 TABLET BY MOUTH AT  BEDTIME AS NEEDED AND MAY  REPEAT DOSE ONE TIME IF  NEEDED FOR MUSCLE SPASMS  (CAREFUL  OF SEDATION)  . Dulaglutide (TRULICITY) 0.75 MG/0.5ML SOPN Inject 0.75 mg into the skin once a week.  . fluticasone (FLONASE) 50 MCG/ACT nasal spray USE 2 SPRAYS IN BOTH  NOSTRILS DAILY  . fluticasone (FLOVENT HFA) 110 MCG/ACT inhaler Inhale 2 puffs into the lungs 2 (two) times daily.  . furosemide (LASIX) 20 MG tablet TAKE 1 TABLET BY MOUTH  DAILY AS NEEDED FOR EDEMA,  SWELLING  . gabapentin (NEURONTIN) 300 MG capsule TAKE 2 CAPSULES BY MOUTH IN THE MORNING AND 3 CAPSULES  AT BEDTIME  . glimepiride (AMARYL) 4 MG tablet TAKE  ONE-HALF TABLET BY  MOUTH TWICE DAILY  . glucose blood test strip Use as instructed  . hydrochlorothiazide (HYDRODIURIL) 25 MG tablet TAKE 1 TABLET BY MOUTH  DAILY  . lidocaine (XYLOCAINE) 2 % solution Use as directed 5-10 mLs in the mouth or throat every 3 (three) hours as needed.  . lisinopril (ZESTRIL) 40 MG tablet TAKE 1 TABLET BY MOUTH  DAILY  . metFORMIN (GLUCOPHAGE) 500 MG tablet TAKE 1 TABLET BY MOUTH  TWICE DAILY WITH A MEAL  . metoprolol succinate (TOPROL-XL) 50 MG 24 hr tablet TAKE 1 TABLET BY MOUTH  TWICE DAILY WITH OR  IMMEDIATELY FOLLOWING A  MEAL  . Multiple Vitamin (MULTIVITAMIN) tablet Take 1 tablet by mouth daily.  . nystatin cream (MYCOSTATIN) Apply 1 application topically as needed for dry skin.  . omeprazole (PRILOSEC) 20 MG capsule Take 1 capsule (20 mg total) by mouth daily.  . ondansetron (ZOFRAN-ODT) 8 MG disintegrating tablet DISSOLVE 1 ON TONGUE EVERY 12 HOURS AS NEEDED FOR NAUSEA  . polycarbophil (FIBERCON) 625 MG tablet Take by mouth.  . PRALUENT 75 MG/ML SOAJ INJECT 75 MG SUBCUTANEOUSLY EVERY 14 DAYS  . traMADol (ULTRAM) 50 MG tablet TAKE 1 TABLET BY MOUTH AT  BEDTIME AS NEEDED  . triamcinolone cream (KENALOG) 0.1 % Apply 1 application topically as needed.    Allergies: Aspirin, Adhesive [tape], Penicillins, Red yeast rice, and Sulfa drugs cross reactors  Social History   Tobacco Use  . Smoking status: Former Smoker    Types: Cigarettes    Quit date: 1995    Years since quitting: 27.1  . Smokeless tobacco: Never Used  . Tobacco comment: Quit 20 yrs ago  Vaping Use  . Vaping Use: Never used  Substance Use Topics  . Alcohol use: No    Alcohol/week: 0.0 standard drinks  . Drug use: No    Family History  Problem Relation Age of Onset  . Heart disease Mother   . Cancer Mother 59       lung  . Breast cancer Mother        maternal grandmother  . Diabetes Sister        type 2  . Breast cancer Brother   . Cancer Maternal Grandmother        breast   . Leukemia Paternal Grandmother        neoplast  . Aneurysm Paternal Grandfather        abdominal (stomach)  . Heart disease Father   . Colon cancer Neg Hx     Review of Systems: A 12-system review of systems was performed and was negative except as noted in the HPI.  --------------------------------------------------------------------------------------------------  Physical Exam: BP 110/80 (BP Location: Left Arm, Patient Position: Sitting, Cuff Size: Large)   Pulse 69   Ht 5' 2" (1.575 m)   Wt 220 lb 4 oz (99.9 kg)   SpO2 96%   BMI   40.28 kg/m   General:  NAD. Neck: No obvious JVD or HJR, though body habitus limits evaluation. Lungs: Clear to auscultation bilaterally without wheezes or crackles. Heart: Regular rate and rhythm without murmurs, rubs, or gallops. Abdomen: Soft, nontender, nondistended. Extremities: No lower extremity edema.  EKG: Normal sinus rhythm without abnormality.  Lab Results  Component Value Date   WBC WILL FOLLOW 09/15/2016   HGB WILL FOLLOW 09/15/2016   HCT WILL FOLLOW 09/15/2016   MCV WILL FOLLOW 09/15/2016   PLT WILL FOLLOW 09/15/2016    Lab Results  Component Value Date   NA 140 10/30/2020   K 4.5 10/30/2020   CL 101 10/30/2020   CO2 24 10/30/2020   BUN 24 10/30/2020   CREATININE 0.95 10/30/2020   GLUCOSE 128 (H) 10/30/2020   ALT 19 10/30/2020    Lab Results  Component Value Date   CHOL 129 10/30/2020   HDL 40 10/30/2020   LDLCALC 45 10/30/2020   LDLDIRECT 54 08/29/2019   TRIG 288 (H) 10/30/2020   CHOLHDL 3.2 10/30/2020    --------------------------------------------------------------------------------------------------  ASSESSMENT AND PLAN: Coronary artery disease: [name] continues to do well following CABG in 2017 without any angina.  Continue indefinite clopidogrel due to intolerance of aspirin.  Chronic HFpEF: [name] appears grossly euvolemic on exam with NYHA class I-2 symptoms.  Volume status seems to be well  managed with every other day furosemide.  Hypertension: Blood pressure reasonably well controlled today.  No medication changes at this time.  Hyperlipidemia associated with type 2 diabetes mellitus: LDL very well controlled on the last check in 10/2019 on combination of atorvastatin and Praluent.  Triglycerides mildly elevated.  Continue to work on lifestyle modifications.  Sinus node dysfunction: No symptoms reported.  Continue routine device clinic follow-up.  Morbid obesity: BMI greater than 40 with multiple comorbidities.  Weight loss encouraged through diet and exercise.  Follow-up: Return to clinic in 6 months.  Christopher End, MD 12/10/2020 11:12 AM  

## 2020-12-10 NOTE — Patient Instructions (Signed)

## 2020-12-12 ENCOUNTER — Ambulatory Visit (INDEPENDENT_AMBULATORY_CARE_PROVIDER_SITE_OTHER): Payer: Medicare Other

## 2020-12-12 DIAGNOSIS — I495 Sick sinus syndrome: Secondary | ICD-10-CM | POA: Diagnosis not present

## 2020-12-15 LAB — CUP PACEART REMOTE DEVICE CHECK
Battery Impedance: 1364 Ohm
Battery Remaining Longevity: 59 mo
Battery Voltage: 2.76 V
Brady Statistic AP VP Percent: 0 %
Brady Statistic AP VS Percent: 0 %
Brady Statistic AS VP Percent: 0 %
Brady Statistic AS VS Percent: 100 %
Date Time Interrogation Session: 20220304094756
Implantable Lead Implant Date: 20091201
Implantable Lead Implant Date: 20091201
Implantable Lead Location: 753859
Implantable Lead Location: 753860
Implantable Lead Model: 5076
Implantable Lead Model: 5076
Implantable Pulse Generator Implant Date: 20091201
Lead Channel Impedance Value: 424 Ohm
Lead Channel Impedance Value: 580 Ohm
Lead Channel Pacing Threshold Amplitude: 0.5 V
Lead Channel Pacing Threshold Amplitude: 0.75 V
Lead Channel Pacing Threshold Pulse Width: 0.4 ms
Lead Channel Pacing Threshold Pulse Width: 0.4 ms
Lead Channel Setting Pacing Amplitude: 2 V
Lead Channel Setting Pacing Amplitude: 2.5 V
Lead Channel Setting Pacing Pulse Width: 0.4 ms
Lead Channel Setting Sensing Sensitivity: 5.6 mV

## 2020-12-23 NOTE — Progress Notes (Signed)
Remote pacemaker transmission.   

## 2020-12-30 ENCOUNTER — Other Ambulatory Visit: Payer: Self-pay

## 2020-12-30 ENCOUNTER — Encounter: Payer: Self-pay | Admitting: Neurology

## 2020-12-30 ENCOUNTER — Ambulatory Visit: Payer: Medicare Other | Admitting: Neurology

## 2020-12-30 VITALS — BP 132/82 | HR 76 | Ht 62.0 in | Wt 216.3 lb

## 2020-12-30 DIAGNOSIS — G4733 Obstructive sleep apnea (adult) (pediatric): Secondary | ICD-10-CM

## 2020-12-30 DIAGNOSIS — Z9989 Dependence on other enabling machines and devices: Secondary | ICD-10-CM

## 2020-12-30 NOTE — Progress Notes (Signed)
"I affect on that inhaler that may subjective:    Patient ID: [name] [name] is a 65 y.o. female.  HPI     Interim history:   65 year old right-handed woman with an underlying complex medical history of hypertension, coronary artery disease, status post CABG (2017), history of postoperative A. 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 The patient is unaccompanied today and presents for her one year check-up. I last saw her on 01/02/2020, at which time she was doing well on AutoPap therapy.  She was compliant with treatment and reported benefit.  She had occasional restless leg symptoms flareup but was on gabapentin consistently because of her history of neuropathy and also had tramadol as needed.  She was advised to follow-up routinely in 1 year.  Today, 12/30/2020: I reviewed her AutoPap compliance data from 11/29/2020 through 12/28/2020, which is a total of 30 days, during which time she used her machine 28 days with percent used days greater than 4 hours at 90%, indicating excellent compliance with an average usage of 8 hours and 58 minutes, residual AHI at goal at 1.6/h, 95th percentile of pressure at 9.8 cm with a range of 5 cm to  11 cm with EPR, leak acceptable with a 95th percentile at 8.3 L/min.  She reports doing well, no issues with the machine. Has used her old/travel machine for a recent travel.  Her current allergies, medications, family history, past medical history, past social history, past surgical history and problem list were reviewed and updated as appropriate.    Previously:    I first met her on 09/13/19, at the request of her primary care physician, at which time she reported a longstanding history of obstructive  sleep apnea.  She was advised to get an updated sleep study to requalify for new equipment.  She had a baseline sleep study on 09/27/2019 which showed a sleep latency of 22 minutes, sleep efficiency of 94.6%, she had an increased percentage of slow-wave sleep and a markedly reduced percentage of REM sleep, overall AHI was 7/h, average oxygen saturation 94%, nadir was 85%.  She was advised to proceed with new equipment and treatment with AutoPap therapy at home.  She had moderate PLM's with mild arousals during her sleep study.   I reviewed her AutoPap compliance data from 12/02/2019 through 12/31/2019, which is a total of 30 days, during which time she used her machine every night with percent used days greater than 4 hours at 100%, indicating superb compliance with an average usage of 9 hours and 25 minutes, residual AHI at goal at 1.8/h, average pressure for the 95th percentile at 9.2 cm, leak acceptable with a 95th percentile at 11.1 L/min on a pressure range of 5 cm to 11 cm with EPR.    09/13/19: (She) was diagnosed with obstructive sleep apnea many years ago, probably 20 years ago.  She has an older CPAP machine.  A CPAP compliance download was not possible.  Prior sleep study results are not available for my review today.  Her Epworth sleepiness score is 6 out of 24.  She reports being consistent with her CPAP.  She bought an auto Pap about 2 years ago online, her settings are 12 to 20 cm per her report.  She had significant sleep disruption before  her sleep apnea diagnosis including significant nocturia and severe restless leg symptoms.  She still has some difficulty maintaining sleep and sometimes going to sleep.  She lived with her sister who was sadly passed away recently, sister had significant cardiac disease and also sleep apnea on a BiPAP machine.  Patient would like to donate her late sisters BiPAP machine.  Patient's weight has been fluctuating over time, compared to her maximum weight she had lost  about 50 pounds at one point but since the pandemic she has gained some weight back, probably about 30 pounds lighter than her max weight at this point.  She goes to bed around 9, rise time is around 7 currently as she is helping her sister's grandchildren with home schooling.  She goes to their home.  Patient is retired.  She would be open to getting reevaluated for her sleep apnea and hopefully get new equipment.  She has been using a small nasal mask.  She is single, has no children of her own, she has 1 outside feral cat.  Her Past Medical History Is Significant For: Past Medical History:  Diagnosis Date  . Allergy   . Anal fissure   . Anxiety   . Arthritis   . Asthma   . Cataract 2010    left  . Colon polyps    diverticulosis  03-2011/2005  . Depression   . Diabetes mellitus 2012   T2DM  . Diverticulosis   . Fatty liver 04/2011  . GERD (gastroesophageal reflux disease)   . Hyperlipidemia   . Hypertension severe   . Migraine   . Obesity   . OSA (obstructive sleep apnea)    C-Pap  . Pacemaker Medtronic   . Sinoatrial node dysfunction (HCC)    syncope    Her Past Surgical History Is Significant For: Past Surgical History:  Procedure Laterality Date  . ABDOMINAL HYSTERECTOMY  2000   total  . CARDIAC CATHETERIZATION N/A 07/20/2016   Procedure: Left Heart Cath and Coronary Angiography;  Surgeon: Peter M Martinique, MD;  Location: [facility];  Service: Cardiovascular;  Laterality: N/A;  . CATARACT EXTRACTION Left   . CORONARY ARTERY BYPASS GRAFT N/A 07/22/2016   Procedure: CORONARY ARTERY BYPASS GRAFTING (CABG) x three , using left internal mammary artery and right leg greater saphenous vein harvested endoscopically;  Surgeon: Ivin Poot, MD;  Location: [facility];  Service: Open Heart Surgery;  Laterality: N/A;  . EYE SURGERY Right 2010  . OOPHORECTOMY    . PACEMAKER PLACEMENT    . T/A right knee    . TEE WITHOUT CARDIOVERSION N/A 07/22/2016   Procedure: TRANSESOPHAGEAL  ECHOCARDIOGRAM (TEE);  Surgeon: Ivin Poot, MD;  Location: [facility];  Service: Open Heart Surgery;  Laterality: N/A;  . TONSILLECTOMY      Her Family History Is Significant For: Family History  Problem Relation Age of Onset  . Heart disease Mother   . Cancer Mother 70       lung  . Breast cancer Mother        maternal grandmother  . Diabetes Sister        type 2  . Breast cancer Brother   . Cancer Maternal Grandmother        breast  . Leukemia Paternal Grandmother        neoplast  . Aneurysm Paternal Grandfather        abdominal (stomach)  . Heart disease Father   . Colon cancer Neg Hx  Her Social History Is Significant For: Social History   Socioeconomic History  . Marital status: Single    Spouse name: Not on file  . Number of children: 0  . Years of education: Not on file  . Highest education level: Not on file  Occupational History  . Occupation: Surveyor, quantity: [employer] [place]  Tobacco Use  . Smoking status: Former Smoker    Types: Cigarettes    Quit date: 1995    Years since quitting: 27.2  . Smokeless tobacco: Never Used  . Tobacco comment: Quit 20 yrs ago  Vaping Use  . Vaping Use: Never used  Substance and Sexual Activity  . Alcohol use: No    Alcohol/week: 0.0 standard drinks  . Drug use: No  . Sexual activity: Yes    Birth control/protection: Post-menopausal, Other-see comments    Comment: Monogamous   Other Topics Concern  . Not on file  Social History Narrative   Single. Exercise: No. Education: College.   Social Determinants of Health   Financial Resource Strain: Not on file  Food Insecurity: Not on file  Transportation Needs: Not on file  Physical Activity: Not on file  Stress: Not on file  Social Connections: Not on file    Her Allergies Are:  Allergies  Allergen Reactions  . Aspirin Other (See Comments)    Sores in mouth Sores in mouth  . Adhesive [Tape] Other (See Comments)    SKIN BLISTERS  . Penicillins      Swelling   . Red Yeast Rice Hives  . Sulfa Drugs Cross Reactors Hives  :   Her Current Medications Are:  Outpatient Encounter Medications as of 12/30/2020  Medication Sig  . albuterol (VENTOLIN HFA) 108 (90 Base) MCG/ACT inhaler Inhale 1-2 puffs into the lungs every 6 (six) hours as needed for wheezing or shortness of breath.  Marland Kitchen atorvastatin (LIPITOR) 80 MG tablet TAKE 1 TABLET BY MOUTH ONCE DAILY AT 6 PM  . Blood Glucose Monitoring Suppl (ACCU-CHEK GUIDE) w/Device KIT 1 Device by Does not apply route in the morning, at noon, and at bedtime.  . cetirizine (ZYRTEC) 10 MG tablet Take 10 mg by mouth daily.  . chlorhexidine (PERIDEX) 0.12 % solution SMARTSIG:By Mouth  . citalopram (CELEXA) 20 MG tablet TAKE 1 TABLET BY MOUTH  DAILY  . clindamycin (CLEOCIN) 300 MG capsule Take 300 mg by mouth 4 (four) times daily.  . clopidogrel (PLAVIX) 75 MG tablet Take 1 tablet (75 mg total) by mouth daily.  . cyclobenzaprine (FLEXERIL) 10 MG tablet TAKE 1 TABLET BY MOUTH AT  BEDTIME AS NEEDED AND MAY  REPEAT DOSE ONE TIME IF  NEEDED FOR MUSCLE SPASMS  (CAREFUL OF SEDATION)  . Dulaglutide (TRULICITY) 6.94 HW/3.8UE SOPN Inject 0.75 mg into the skin once a week.  . fluticasone (FLONASE) 50 MCG/ACT nasal spray USE 2 SPRAYS IN BOTH  NOSTRILS DAILY  . fluticasone (FLOVENT HFA) 110 MCG/ACT inhaler Inhale 2 puffs into the lungs 2 (two) times daily.  . furosemide (LASIX) 20 MG tablet TAKE 1 TABLET BY MOUTH  DAILY AS NEEDED FOR EDEMA,  SWELLING  . gabapentin (NEURONTIN) 300 MG capsule TAKE 2 CAPSULES BY MOUTH IN THE MORNING AND 3 CAPSULES  AT BEDTIME  . glimepiride (AMARYL) 4 MG tablet TAKE ONE-HALF TABLET BY  MOUTH TWICE DAILY  . glucose blood test strip Use as instructed  . hydrochlorothiazide (HYDRODIURIL) 25 MG tablet TAKE 1 TABLET BY MOUTH  DAILY  . hydrocortisone cream  1 % Apply 1 application topically 2 (two) times daily.  Marland Kitchen lidocaine (XYLOCAINE) 2 % solution Use as directed 5-10 mLs in the mouth or throat  every 3 (three) hours as needed.  Marland Kitchen lisinopril (ZESTRIL) 40 MG tablet TAKE 1 TABLET BY MOUTH  DAILY  . metFORMIN (GLUCOPHAGE) 500 MG tablet TAKE 1 TABLET BY MOUTH  TWICE DAILY WITH A MEAL  . metoprolol succinate (TOPROL-XL) 50 MG 24 hr tablet TAKE 1 TABLET BY MOUTH  TWICE DAILY WITH OR  IMMEDIATELY FOLLOWING A  MEAL  . Multiple Vitamin (MULTIVITAMIN) tablet Take 1 tablet by mouth daily.  Marland Kitchen nystatin cream (MYCOSTATIN) Apply 1 application topically as needed for dry skin.  Marland Kitchen omeprazole (PRILOSEC) 20 MG capsule Take 1 capsule (20 mg total) by mouth daily.  . ondansetron (ZOFRAN-ODT) 8 MG disintegrating tablet DISSOLVE 1 ON TONGUE EVERY 12 HOURS AS NEEDED FOR NAUSEA  . polycarbophil (FIBERCON) 625 MG tablet Take by mouth.  Marland Kitchen PRALUENT 75 MG/ML SOAJ INJECT 75 MG SUBCUTANEOUSLY EVERY 14 DAYS  . traMADol (ULTRAM) 50 MG tablet TAKE 1 TABLET BY MOUTH AT  BEDTIME AS NEEDED  . triamcinolone cream (KENALOG) 0.1 % Apply 1 application topically as needed.   No facility-administered encounter medications on file as of 12/30/2020.  :  Review of Systems:  Out of a complete 14 point review of systems, all are reviewed and negative with the exception of these symptoms as listed below: Review of Systems  Neurological:       Here for yearly f/u. Reports she has been doing well. No complaints.     Objective:  Neurological Exam  Physical Exam Physical Examination:   Vitals:   12/30/20 0958  BP: 132/82  Pulse: 76    General Examination: The patient is a very pleasant 65 y.o. female in no acute distress. She appears well-developed and well-nourished and well groomed.   HEENT:Normocephalic, atraumatic, pupils are equal, round and reactive to light, extraocular tracking is good without limitation to gaze excursion or nystagmus noted. Hearing is grossly intact. Face is symmetric with normal facial animation. Speech is clear with no dysarthria noted. There is no hypophonia. There is no lip, neck/head, jaw or  voice tremor. Neck with FROM. There are no carotid bruits on auscultation. Oropharynx exam reveals: mild mouth dryness, and moderateairway crowding. Tongue protrudes centrally in palate elevates symmetrically.  Chest:Clear to auscultation without wheezing, rhonchi or crackles noted.  Heart:S1+S2+0, regular and normal without murmurs, rubs or gallops noted.   Abdomen:Soft, non-tender and non-distended.  Extremities:There isno pitting edema in the distal lower extremities bilaterally.   Some nonpitting puffiness noted.  Skin: Warm and dry without trophic changes noted.   Musculoskeletal: exam reveals no obvious joint deformities, tenderness or joint swelling or erythema.   Neurologically:  Mental status: The patient is awake, alert and oriented in all 4 spheres.Herimmediate and remote memory, attention, language skills and fund of knowledge are appropriate. There is no evidence of aphasia, agnosia, apraxia or anomia. Speech is clear with normal prosody and enunciation. Thought process is linear. Mood is normaland affect is normal.  Cranial nerves II - XII are as described above under HEENT exam.  Motor exam: Normal bulk, strength and tone is noted. There is no tremor, fine motor skills and coordination: grossly intact.  Cerebellar testing: No dysmetria or intention tremor. There is no truncal or gait ataxia.  Sensory exam: intact to light touch in the upper and lower extremities.  Gait, station and balance:Shestands easily. No veering to  one side is noted. No leaning to one side is noted. Posture is age-appropriate and stance is narrow based. Gait showsnormalstride length and normalpace. No problems turning are noted, all stable.   Assessmentand Plan:   In summary,[name] Coxis a very pleasant 82 year oldfemalewith an underlying complex medical history of hypertension, coronary artery disease, status post CABG (2017), history of postoperative A. 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 She has been stable on AutoPap therapy.  She is fully compliant with treatment.  She has a longstanding history of sleep apnea and has been on CPAP before.  Her diagnostic sleep study from 09/27/2019 confirmed obstructive sleep apnea, overall in the mild range with an AHI of 7/h, O2 nadir of 85% but she did have reduced percentage of REM sleep and absence of supine REM sleep during her study.  She also had moderate PLM's with mild arousals.    She also has a history of restless leg syndrome and leg twitching at night.  She feels stable.  She continues to benefit from treatment and likes her new AutoPap machine. She is commended for her treatment adherence with her AutoPap.  She is motivated to continue with it.  She is advised to follow-up routinely in 1 year, she can see one of our nurse practitioners.  I answered all her questions today and she was in agreement. I spent 20 minutes in total face-to-face time and in reviewing records during pre-charting, more than 50% of which was spent in counseling and coordination of care, reviewing test results, reviewing medications and treatment regimen and/or in discussing or reviewing the diagnosis of OSA, the prognosis and treatment options. Pertinent laboratory and imaging test results that were available during this visit with the patient were reviewed by me and considered in my medical decision making (see chart for details).

## 2020-12-30 NOTE — Patient Instructions (Addendum)
It is always nice to see you.  I am glad you are doing well with your AutoPap machine, you are fully compliant with it.  Keep up the good work! We can see you in 1 year, you can see one of our nurse practitioners as you are stable.  Please call us if you have any interim questions or concerns.

## 2021-01-01 ENCOUNTER — Ambulatory Visit: Payer: Medicare Other | Admitting: Neurology

## 2021-01-15 ENCOUNTER — Other Ambulatory Visit: Payer: Self-pay | Admitting: Emergency Medicine

## 2021-01-15 DIAGNOSIS — F419 Anxiety disorder, unspecified: Secondary | ICD-10-CM

## 2021-01-24 ENCOUNTER — Other Ambulatory Visit: Payer: Self-pay | Admitting: Emergency Medicine

## 2021-01-24 DIAGNOSIS — I2510 Atherosclerotic heart disease of native coronary artery without angina pectoris: Secondary | ICD-10-CM

## 2021-03-04 DIAGNOSIS — G4733 Obstructive sleep apnea (adult) (pediatric): Secondary | ICD-10-CM | POA: Diagnosis not present

## 2021-03-06 NOTE — Addendum Note (Signed)
Addended by: Alfredia Ferguson A on: 03/06/2021 10:54 AM   Modules accepted: Orders

## 2021-03-13 ENCOUNTER — Ambulatory Visit (INDEPENDENT_AMBULATORY_CARE_PROVIDER_SITE_OTHER): Payer: Medicare Other

## 2021-03-13 DIAGNOSIS — I495 Sick sinus syndrome: Secondary | ICD-10-CM | POA: Diagnosis not present

## 2021-03-13 LAB — CUP PACEART REMOTE DEVICE CHECK
Battery Impedance: 1473 Ohm
Battery Remaining Longevity: 55 mo
Battery Voltage: 2.76 V
Brady Statistic AP VP Percent: 0 %
Brady Statistic AP VS Percent: 0 %
Brady Statistic AS VP Percent: 0 %
Brady Statistic AS VS Percent: 100 %
Date Time Interrogation Session: 20220602221315
Implantable Lead Implant Date: 20091201
Implantable Lead Implant Date: 20091201
Implantable Lead Location: 753859
Implantable Lead Location: 753860
Implantable Lead Model: 5076
Implantable Lead Model: 5076
Implantable Pulse Generator Implant Date: 20091201
Lead Channel Impedance Value: 397 Ohm
Lead Channel Impedance Value: 576 Ohm
Lead Channel Pacing Threshold Amplitude: 0.5 V
Lead Channel Pacing Threshold Amplitude: 0.75 V
Lead Channel Pacing Threshold Pulse Width: 0.4 ms
Lead Channel Pacing Threshold Pulse Width: 0.4 ms
Lead Channel Setting Pacing Amplitude: 2 V
Lead Channel Setting Pacing Amplitude: 2.5 V
Lead Channel Setting Pacing Pulse Width: 0.4 ms
Lead Channel Setting Sensing Sensitivity: 5.6 mV

## 2021-03-17 ENCOUNTER — Other Ambulatory Visit: Payer: Self-pay | Admitting: Emergency Medicine

## 2021-03-17 DIAGNOSIS — Z1231 Encounter for screening mammogram for malignant neoplasm of breast: Secondary | ICD-10-CM

## 2021-03-18 ENCOUNTER — Ambulatory Visit (INDEPENDENT_AMBULATORY_CARE_PROVIDER_SITE_OTHER): Payer: Medicare Other

## 2021-03-18 ENCOUNTER — Other Ambulatory Visit: Payer: Self-pay

## 2021-03-18 DIAGNOSIS — Z13820 Encounter for screening for osteoporosis: Secondary | ICD-10-CM

## 2021-03-18 DIAGNOSIS — M8589 Other specified disorders of bone density and structure, multiple sites: Secondary | ICD-10-CM | POA: Diagnosis not present

## 2021-03-18 DIAGNOSIS — Z1231 Encounter for screening mammogram for malignant neoplasm of breast: Secondary | ICD-10-CM

## 2021-03-27 ENCOUNTER — Other Ambulatory Visit: Payer: Self-pay | Admitting: Emergency Medicine

## 2021-03-31 NOTE — Progress Notes (Signed)
Remote pacemaker transmission.   

## 2021-04-01 ENCOUNTER — Other Ambulatory Visit: Payer: Self-pay | Admitting: Emergency Medicine

## 2021-04-01 DIAGNOSIS — G629 Polyneuropathy, unspecified: Secondary | ICD-10-CM

## 2021-04-09 ENCOUNTER — Other Ambulatory Visit: Payer: Self-pay | Admitting: Emergency Medicine

## 2021-04-09 ENCOUNTER — Other Ambulatory Visit: Payer: Self-pay | Admitting: Internal Medicine

## 2021-04-09 DIAGNOSIS — G629 Polyneuropathy, unspecified: Secondary | ICD-10-CM

## 2021-04-09 DIAGNOSIS — E1142 Type 2 diabetes mellitus with diabetic polyneuropathy: Secondary | ICD-10-CM

## 2021-04-26 ENCOUNTER — Other Ambulatory Visit: Payer: Self-pay | Admitting: Family

## 2021-04-27 ENCOUNTER — Ambulatory Visit (INDEPENDENT_AMBULATORY_CARE_PROVIDER_SITE_OTHER): Payer: Medicare Other | Admitting: Emergency Medicine

## 2021-04-27 ENCOUNTER — Encounter: Payer: Self-pay | Admitting: Emergency Medicine

## 2021-04-27 ENCOUNTER — Other Ambulatory Visit: Payer: Self-pay

## 2021-04-27 VITALS — BP 132/70 | HR 74 | Temp 98.7°F | Ht 62.0 in | Wt 209.0 lb

## 2021-04-27 DIAGNOSIS — Z87891 Personal history of nicotine dependence: Secondary | ICD-10-CM | POA: Diagnosis not present

## 2021-04-27 DIAGNOSIS — E1159 Type 2 diabetes mellitus with other circulatory complications: Secondary | ICD-10-CM

## 2021-04-27 DIAGNOSIS — E785 Hyperlipidemia, unspecified: Secondary | ICD-10-CM

## 2021-04-27 DIAGNOSIS — I152 Hypertension secondary to endocrine disorders: Secondary | ICD-10-CM

## 2021-04-27 DIAGNOSIS — E1169 Type 2 diabetes mellitus with other specified complication: Secondary | ICD-10-CM | POA: Diagnosis not present

## 2021-04-27 DIAGNOSIS — I495 Sick sinus syndrome: Secondary | ICD-10-CM | POA: Diagnosis not present

## 2021-04-27 DIAGNOSIS — Z45018 Encounter for adjustment and management of other part of cardiac pacemaker: Secondary | ICD-10-CM | POA: Diagnosis not present

## 2021-04-27 DIAGNOSIS — I5032 Chronic diastolic (congestive) heart failure: Secondary | ICD-10-CM | POA: Diagnosis not present

## 2021-04-27 DIAGNOSIS — G4733 Obstructive sleep apnea (adult) (pediatric): Secondary | ICD-10-CM

## 2021-04-27 DIAGNOSIS — I2510 Atherosclerotic heart disease of native coronary artery without angina pectoris: Secondary | ICD-10-CM | POA: Diagnosis not present

## 2021-04-27 DIAGNOSIS — Z951 Presence of aortocoronary bypass graft: Secondary | ICD-10-CM

## 2021-04-27 LAB — POCT GLYCOSYLATED HEMOGLOBIN (HGB A1C): Hemoglobin A1C: 5.5 % (ref 4.0–5.6)

## 2021-04-27 NOTE — Progress Notes (Signed)
[name] [name] 65 y.o.   Chief Complaint  Patient presents with   Follow-up    6 month check    HISTORY OF PRESENT ILLNESS: This is a 65 y.o. female with multiple chronic medical problems including hypertension, diabetes, coronary artery disease, HLD, permanent pacemaker, congestive heart failure, obstructive sleep apnea here for follow-up. Doing well.  Has no complaints or medical concerns today. Blood sugars at home within normal limits Blood pressure readings at home within normal limits Most recent neurologist and cardiologist evaluations as follows: ASSESSMENT AND PLAN: Coronary artery disease: [name] [name] continues to do well following CABG in 2017 without any angina.  Continue indefinite clopidogrel due to intolerance of aspirin.   Chronic HFpEF: [name] [name] appears grossly euvolemic on exam with NYHA class I-2 symptoms.  Volume status seems to be well managed with every other day furosemide.   Hypertension: Blood pressure reasonably well controlled today.  No medication changes at this time.   Hyperlipidemia associated with type 2 diabetes mellitus: LDL very well controlled on the last check in 10/2019 on combination of atorvastatin and Praluent.  Triglycerides mildly elevated.  Continue to work on lifestyle modifications.   Sinus node dysfunction: No symptoms reported.  Continue routine device clinic follow-up.   Morbid obesity: BMI greater than 40 with multiple comorbidities.  Weight loss encouraged through diet and exercise.   Follow-up: Return to clinic in 6 months.   [name] Bush, MD 12/10/2020 11:12 AM  Assessment and Plan:    In summary, [name] [name] is a very pleasant 65 year old female with an underlying complex medical history of hypertension, coronary artery disease, status post CABG (2017), history of postoperative A. fib, hyperlipidemia, fatty liver, diverticulosis, diabetes, SA node dysfunction with status post pacemaker placement, migraine headaches,  depression, anxiety, arthritis, asthma, allergies and obesity, who presents for FU consultation of her obstructive sleep apnea. She has been stable on AutoPap therapy.  She is fully compliant with treatment.  She has a longstanding history of sleep apnea and has been on CPAP before.  Her diagnostic sleep study from 09/27/2019 confirmed obstructive sleep apnea, overall in the mild range with an AHI of 7/h, O2 nadir of 85% but she did have reduced percentage of REM sleep and absence of supine REM sleep during her study.  She also had moderate PLM's with mild arousals.    She also has a history of restless leg syndrome and leg twitching at night.  She feels stable.  She continues to benefit from treatment and likes her new AutoPap machine. She is commended for her treatment adherence with her AutoPap.  She is motivated to continue with it.  She is advised to follow-up routinely in 1 year, she can see one of our nurse practitioners.  I answered all her questions today and she was in agreement.  My last office visit assessment and plan as follows: ASSESSMENT & PLAN: Hypertension associated with diabetes ([name] [name]) Well-controlled hypertension.  Continue present medications.  No changes. Well-controlled diabetes with hemoglobin A1c of 6.7.  Continue present medications no changes. Follow-up in 6 months.    HPI   Prior to Admission medications   Medication Sig Start Date End Date Taking? Authorizing Provider  albuterol (VENTOLIN HFA) 108 (90 Base) MCG/ACT inhaler Inhale 1-2 puffs into the lungs every 6 (six) hours as needed for wheezing or shortness of breath. 04/17/19  Yes Horald Pollen, MD  atorvastatin (LIPITOR) 80 MG tablet TAKE 1 TABLET BY MOUTH ONCE DAILY AT 6 PM 01/25/21  Yes Horald Pollen, MD  Blood Glucose Monitoring Suppl (ACCU-CHEK GUIDE) w/Device KIT 1 Device by Does not apply route in the morning, at noon, and at bedtime. 02/26/20  Yes Basilio Meadow, Ines Bloomer, MD  cetirizine (ZYRTEC) 10  MG tablet Take 10 mg by mouth daily.   Yes [provider]  chlorhexidine (PERIDEX) 0.12 % solution SMARTSIG:By Mouth 12/16/20  Yes [provider]  citalopram (CELEXA) 20 MG tablet TAKE 1 TABLET BY MOUTH  DAILY 01/15/21  Yes Mahlik Lenn, Ines Bloomer, MD  clindamycin (CLEOCIN) 300 MG capsule Take 300 mg by mouth 4 (four) times daily. 12/25/20  Yes [provider]  clopidogrel (PLAVIX) 75 MG tablet TAKE 1 TABLET BY MOUTH  DAILY 04/26/21  Yes Loel Dubonnet, NP  cyclobenzaprine (FLEXERIL) 10 MG tablet TAKE 1 TABLET BY MOUTH AT  BEDTIME AS NEEDED AND MAY  REPEAT DOSE ONE TIME IF  NEEDED FOR MUSCLE SPASMS  (CAREFUL OF SEDATION) 02/14/20  Yes Dezyrae Kensinger, Ines Bloomer, MD  Dulaglutide (TRULICITY) 8.41 YS/0.6TK SOPN Inject 0.75 mg into the skin once a week. 10/30/19  Yes Umi Mainor, Ines Bloomer, MD  fluticasone [facility]) 50 MCG/ACT nasal spray USE 2 SPRAYS IN BOTH  NOSTRILS DAILY 09/17/20  Yes Rahman Ferrall, Ines Bloomer, MD  fluticasone (FLOVENT HFA) 110 MCG/ACT inhaler Inhale 2 puffs into the lungs 2 (two) times daily. 04/17/19  Yes Karman Veney, Ines Bloomer, MD  furosemide (LASIX) 20 MG tablet TAKE 1 TABLET BY MOUTH  DAILY AS NEEDED FOR EDEMA,  SWELLING 07/29/20  Yes End, Harrell Gave, MD  gabapentin (NEURONTIN) 300 MG capsule TAKE 2 CAPSULES BY MOUTH IN THE MORNING AND 3 CAPSULES  AT BEDTIME 06/06/19  Yes Taggert Bozzi, Ines Bloomer, MD  glimepiride (AMARYL) 4 MG tablet TAKE ONE-HALF TABLET BY  MOUTH TWICE DAILY 10/25/20  Yes Horald Pollen, MD  glucose blood test strip Use as instructed 10/29/19  Yes Koriana Stepien, Ines Bloomer, MD  hydrochlorothiazide (HYDRODIURIL) 25 MG tablet TAKE 1 TABLET BY MOUTH  DAILY 04/10/21  Yes End, Harrell Gave, MD  hydrocortisone cream 1 % Apply 1 application topically 2 (two) times daily.   Yes [provider]  lidocaine (XYLOCAINE) 2 % solution Use as directed 5-10 mLs in the mouth or throat every 3 (three) hours as needed. 04/17/19  Yes Horald Pollen, MD  lisinopril  (ZESTRIL) 40 MG tablet TAKE 1 TABLET BY MOUTH  DAILY 01/25/21  Yes Horald Pollen, MD  metFORMIN (GLUCOPHAGE) 500 MG tablet TAKE 1 TABLET BY MOUTH  TWICE DAILY WITH A MEAL 10/25/20  Yes Damek Ende, Ines Bloomer, MD  metoprolol succinate (TOPROL-XL) 50 MG 24 hr tablet TAKE 1 TABLET BY MOUTH  TWICE DAILY WITH OR  IMMEDIATELY FOLLOWING A  MEAL 03/29/21  Yes Scotty Weigelt, Ines Bloomer, MD  Multiple Vitamin (MULTIVITAMIN) tablet Take 1 tablet by mouth daily. 08/30/16  Yes Lars Pinks M, PA-C  nystatin cream (MYCOSTATIN) Apply 1 application topically as needed for dry skin. 04/28/20  Yes Janece Laidlaw, Ines Bloomer, MD  omeprazole (PRILOSEC) 20 MG capsule Take 1 capsule (20 mg total) by mouth daily. 12/15/16  Yes English, Colletta [place] D, PA  ondansetron (ZOFRAN-ODT) 8 MG disintegrating tablet DISSOLVE 1 ON TONGUE EVERY 12 HOURS AS NEEDED FOR NAUSEA 04/17/19  Yes Bunny Lowdermilk, Ines Bloomer, MD  polycarbophil (FIBERCON) 625 MG tablet Take by mouth.   Yes [provider]  PRALUENT 75 MG/ML SOAJ INJECT 75 MG SUBCUTANEOUSLY EVERY 14 DAYS 11/06/20  Yes End, Harrell Gave, MD  traMADol (ULTRAM) 50 MG tablet TAKE 1 TABLET BY MOUTH AT  BEDTIME AS NEEDED 04/02/21  Yes Yusef Lamp, Ines Bloomer, MD  triamcinolone cream (KENALOG) 0.1 % Apply 1 application topically as needed. 04/17/19  Yes DonnaInes Bloomer, MD    Allergies  Allergen Reactions   Aspirin Other (See Comments)    Sores in mouth Sores in mouth   Adhesive [Tape] Other (See Comments)    SKIN BLISTERS   Penicillins     Swelling    Red Yeast Rice Hives   Sulfa Drugs Cross Reactors Hives    Patient Active Problem List   Diagnosis Date Noted   Hyperlipidemia associated with type 2 diabetes mellitus ([place]) 12/10/2020   Trigger finger, right middle finger 11/07/2019   Hypertension associated with diabetes ([place]) 10/29/2019   Chronic heart failure with preserved ejection fraction (HFpEF) ([place]) 05/25/2019   Coronary artery disease involving native coronary artery  of native heart without angina pectoris 02/14/2018   Morbid obesity ([place]) 02/14/2018   Irritable bowel syndrome 06/28/2017   S/P CABG x 3 07/22/2016   Multiple vessel coronary artery disease 07/21/2016   STEMI involving left circumflex coronary artery ([place]) 07/20/2016   Asthmatic bronchitis 04/26/2016   Allergic urticaria 04/26/2016   Environmental allergies 04/26/2016   Anxiety 04/26/2016   Sinus node dysfunction ([place]) 04/26/2016   Pacemaker Medtronic    OSA (obstructive sleep apnea) 05/25/2011   Hx of adenomatous colonic polyps 05/25/2011   Migraine 05/25/2011   Diabetes mellitus type 2, uncontrolled, with complications ([place]) 44/31/5400   GERD (gastroesophageal reflux disease) 05/25/2011   NAFLD (nonalcoholic fatty liver disease) 05/25/2011   Hyperlipidemia LDL goal <70 05/25/2011   Essential hypertension 86/76/1950   DIASTOLIC HEART FAILURE, CHRONIC 09/16/2009    Past Medical History:  Diagnosis Date   Allergy    Anal fissure    Anxiety    Arthritis    Asthma    Cataract 2010    left   Colon polyps    diverticulosis  03-2011/2005   Depression    Diabetes mellitus 2012   T2DM   Diverticulosis    Fatty liver 04/2011   GERD (gastroesophageal reflux disease)    Hyperlipidemia    Hypertension severe    Migraine    Obesity    OSA (obstructive sleep apnea)    C-Pap   Pacemaker Medtronic    Sinoatrial node dysfunction ([place])    syncope    Past Surgical History:  Procedure Laterality Date   ABDOMINAL HYSTERECTOMY  2000   total   CARDIAC CATHETERIZATION N/A 07/20/2016   Procedure: Left Heart Cath and Coronary Angiography;  Surgeon: Peter M Martinique, MD;  Location: [facility];  Service: Cardiovascular;  Laterality: N/A;   CATARACT EXTRACTION Left    CORONARY ARTERY BYPASS GRAFT N/A 07/22/2016   Procedure: CORONARY ARTERY BYPASS GRAFTING (CABG) x three , using left internal mammary artery and right leg greater saphenous vein harvested endoscopically;  Surgeon:  Ivin Poot, MD;  Location: [facility];  Service: Open Heart Surgery;  Laterality: N/A;   EYE SURGERY Right 2010   OOPHORECTOMY     PACEMAKER PLACEMENT     T/A right knee     TEE WITHOUT CARDIOVERSION N/A 07/22/2016   Procedure: TRANSESOPHAGEAL ECHOCARDIOGRAM (TEE);  Surgeon: Ivin Poot, MD;  Location: [facility];  Service: Open Heart Surgery;  Laterality: N/A;   TONSILLECTOMY      Social History   Socioeconomic History   Marital status: Single    Spouse name: Not on file   Number of children: 0  Years of education: Not on file   Highest education level: Not on file  Occupational History   Occupation: chef    Employer: [employer]  Tobacco Use   Smoking status: Former    Types: Cigarettes    Quit date: 1995    Years since quitting: 27.5   Smokeless tobacco: Never   Tobacco comments:    Quit 20 yrs ago  Vaping Use   Vaping Use: Never used  Substance and Sexual Activity   Alcohol use: No    Alcohol/week: 0.0 standard drinks   Drug use: No   Sexual activity: Yes    Birth control/protection: Post-menopausal, Other-see comments    Comment: Monogamous   Other Topics Concern   Not on file  Social History Narrative   Single. Exercise: No. Education: College.   Social Determinants of Health   Financial Resource Strain: Not on file  Food Insecurity: Not on file  Transportation Needs: Not on file  Physical Activity: Not on file  Stress: Not on file  Social Connections: Not on file  Intimate Partner Violence: Not on file    Family History  Problem Relation Age of Onset   Heart disease Mother    Cancer Mother 86       lung   Breast cancer Mother 41   Diabetes Sister        type 2   Cancer Maternal Grandmother        breast   Breast cancer Maternal Grandmother    Leukemia Paternal Grandmother        neoplast   Aneurysm Paternal Grandfather        abdominal (stomach)   Heart disease Father    Colon cancer Neg Hx      Review of Systems   Constitutional: Negative.  Negative for chills and fever.  HENT: Negative.  Negative for congestion and sore throat.   Respiratory: Negative.  Negative for cough and shortness of breath.   Cardiovascular: Negative.  Negative for chest pain and palpitations.  Gastrointestinal: Negative.  Negative for abdominal pain, blood in stool, diarrhea, melena, nausea and vomiting.  Genitourinary: Negative.  Negative for dysuria and hematuria.  Musculoskeletal: Negative.   Skin: Negative.  Negative for rash.  Neurological: Negative.  Negative for dizziness and headaches.  All other systems reviewed and are negative.  Today's Vitals   04/27/21 0906  BP: 132/70  Pulse: 74  Temp: 98.7 F (37.1 C)  TempSrc: Oral  SpO2: 96%  Weight: 209 lb (94.8 kg)  Height: 5' 2" (1.575 m)   Body mass index is 38.23 kg/m. Wt Readings from Last 3 Encounters:  04/27/21 209 lb (94.8 kg)  12/30/20 216 lb 5 oz (98.1 kg)  12/10/20 220 lb 4 oz (99.9 kg)    Physical Exam Vitals reviewed.  Constitutional:      Appearance: Normal appearance. She is obese.  HENT:     Head: Normocephalic.  Eyes:     Extraocular Movements: Extraocular movements intact.     Conjunctiva/sclera: Conjunctivae normal.     Pupils: Pupils are equal, round, and reactive to light.  Cardiovascular:     Rate and Rhythm: Normal rate and regular rhythm.     Pulses: Normal pulses.     Heart sounds: Normal heart sounds.  Pulmonary:     Effort: Pulmonary effort is normal.     Breath sounds: Normal breath sounds.  Abdominal:     Palpations: Abdomen is soft.     Tenderness: There  is no abdominal tenderness.  Musculoskeletal:        General: Normal range of motion.     Cervical back: Normal range of motion and neck supple.     Right lower leg: No edema.     Left lower leg: No edema.  Skin:    General: Skin is warm and dry.  Neurological:     General: No focal deficit present.     Mental Status: She is alert and oriented to person, place,  and time.  Psychiatric:        Mood and Affect: Mood normal.        Behavior: Behavior normal.     ASSESSMENT & PLAN: A total of 30 minutes was spent with the patient and counseling/coordination of care regarding preparing for this visit, review of most recent office visit notes, review of most recent specialist office visit notes, review of all medications, review of most recent blood work results including today's hemoglobin A1c, education on nutrition, health maintenance items, prognosis documentation and need for follow-up.  Hypertension associated with diabetes ([place]) Well-controlled hypertension.  Continue hydrochlorothiazide 25 mg daily and lisinopril 40 mg daily. Well-controlled diabetes with hemoglobin A1c of 5.5. Continue metformin 500 mg twice a day and weekly Trulicity 6.81 mg.  Stop glimepiride. Diet and nutrition discussed.  Chronic heart failure with preserved ejection fraction (HFpEF) ([facility]) Clinically stable.  Clinically euvolemic.  Continue Lasix 20 mg daily.  Coronary artery disease involving native coronary artery of native heart without angina pectoris Clinically stable without significant episodes of angina. Continue metoprolol succinate 50 mg twice daily. Continue Plavix 75 mg daily.  Intolerant to aspirin.  OSA (obstructive sleep apnea) Stable with recent evaluation by sleep apnea doctor.  On CPAP treatment. Doing very well.  Hyperlipidemia associated with type 2 diabetes mellitus ([place]) Diet and nutrition discussed.  Continue atorvastatin 80 mg daily and Praluent every 14 days.  [name] [name] was seen today for follow-up.  Diagnoses and all orders for this visit:  Hyperlipidemia associated with type 2 diabetes mellitus ([place])  Hypertension associated with diabetes ([place]) -     POCT glycosylated hemoglobin (Hb A1C)  Coronary artery disease involving native coronary artery of native heart without angina pectoris  Morbid obesity ([facility])  Chronic heart failure  with preserved ejection fraction (HFpEF) ([facility])  S/P CABG x 3  Pacemaker Medtronic  Sinus node dysfunction ([facility])  OSA (obstructive sleep apnea)  Patient Instructions  Stop Glimepiride. Continue all other medications. Diabetes Mellitus and Nutrition, Adult When you have diabetes, or diabetes mellitus, it is very important to have healthy eating habits because your blood sugar (glucose) levels are greatly affected by what you eat and drink. Eating healthy foods in the right amounts, at about the same times every day, can help you: Control your blood glucose. Lower your risk of heart disease. Improve your blood pressure. Reach or maintain a healthy weight. What can affect my meal plan? Every person with diabetes is different, and each person has different needs for a meal plan. Your health care provider may recommend that you work with a dietitian to make a meal plan that is best for you. Your meal plan may vary depending on factors such as: The calories you need. The medicines you take. Your weight. Your blood glucose, blood pressure, and cholesterol levels. Your activity level. Other health conditions you have, such as heart or kidney disease. How do carbohydrates affect me? Carbohydrates, also called carbs, affect your blood glucose level more than any  other type of food. Eating carbs naturally raises the amount of glucose in your blood. Carb counting is a method for keeping track of how many carbs you eat. Counting carbs is important to keep your blood glucose at a healthy level,especially if you use insulin or take certain oral diabetes medicines. It is important to know how many carbs you can safely have in each meal. This is different for every person. Your dietitian can help you calculate how manycarbs you should have at each meal and for each snack. How does alcohol affect me? Alcohol can cause a sudden decrease in blood glucose (hypoglycemia), especially if you use insulin or  take certain oral diabetes medicines. Hypoglycemia can be a life-threatening condition. Symptoms of hypoglycemia, such as sleepiness, dizziness, and confusion, are similar to symptoms of having too much alcohol. Do not drink alcohol if: Your health care provider tells you not to drink. You are pregnant, may be pregnant, or are planning to become pregnant. If you drink alcohol: Do not drink on an empty stomach. Limit how much you use to: 0-1 drink a day for women. 0-2 drinks a day for men. Be aware of how much alcohol is in your drink. In the [place] one drink equals one 12 oz bottle of beer (355 mL), one 5 oz glass of wine (148 mL), or one 1 oz glass of hard liquor (44 mL). Keep yourself hydrated with water, diet soda, or unsweetened iced tea. Keep in mind that regular soda, juice, and other mixers may contain a lot of sugar and must be counted as carbs. What are tips for following this plan?  Reading food labels Start by checking the serving size on the "Nutrition Facts" label of packaged foods and drinks. The amount of calories, carbs, fats, and other nutrients listed on the label is based on one serving of the item. Many items contain more than one serving per package. Check the total grams (g) of carbs in one serving. You can calculate the number of servings of carbs in one serving by dividing the total carbs by 15. For example, if a food has 30 g of total carbs per serving, it would be equal to 2 servings of carbs. Check the number of grams (g) of saturated fats and trans fats in one serving. Choose foods that have a low amount or none of these fats. Check the number of milligrams (mg) of salt (sodium) in one serving. Most people should limit total sodium intake to less than 2,300 mg per day. Always check the nutrition information of foods labeled as "low-fat" or "nonfat." These foods may be higher in added sugar or refined carbs and should be avoided. Talk to your dietitian to identify your  daily goals for nutrients listed on the label. Shopping Avoid buying canned, pre-made, or processed foods. These foods tend to be high in fat, sodium, and added sugar. Shop around the outside edge of the grocery store. This is where you will most often find fresh fruits and vegetables, bulk grains, fresh meats, and fresh dairy. Cooking Use low-heat cooking methods, such as baking, instead of high-heat cooking methods like deep frying. Cook using healthy oils, such as olive, canola, or sunflower oil. Avoid cooking with butter, cream, or high-fat meats. Meal planning Eat meals and snacks regularly, preferably at the same times every day. Avoid going long periods of time without eating. Eat foods that are high in fiber, such as fresh fruits, vegetables, beans, and whole grains. Talk with your  dietitian about how many servings of carbs you can eat at each meal. Eat 4-6 oz (112-168 g) of lean protein each day, such as lean meat, chicken, fish, eggs, or tofu. One ounce (oz) of lean protein is equal to: 1 oz (28 g) of meat, chicken, or fish. 1 egg.  cup (62 g) of tofu. Eat some foods each day that contain healthy fats, such as avocado, nuts, seeds, and fish. What foods should I eat? Fruits Berries. Apples. Oranges. Peaches. Apricots. Plums. Grapes. Mango. Papaya.Pomegranate. Kiwi. Cherries. Vegetables Lettuce. Spinach. Leafy greens, including kale, chard, collard greens, and mustard greens. Beets. Cauliflower. Cabbage. Broccoli. Carrots. Green beans.Tomatoes. Peppers. Onions. Cucumbers. Brussels sprouts. Grains Whole grains, such as whole-wheat or whole-grain bread, crackers, tortillas,cereal, and pasta. Unsweetened oatmeal. Quinoa. Brown or wild rice. Meats and other proteins Seafood. Poultry without skin. Lean cuts of poultry and beef. Tofu. Nuts. Seeds. Dairy Low-fat or fat-free dairy products such as milk, yogurt, and cheese. The items listed above may not be a complete list of foods and  beverages you can eat. Contact a dietitian for more information. What foods should I avoid? Fruits Fruits canned with syrup. Vegetables Canned vegetables. Frozen vegetables with butter or cream sauce. Grains Refined white flour and flour products such as bread, pasta, snack foods, andcereals. Avoid all processed foods. Meats and other proteins Fatty cuts of meat. Poultry with skin. Breaded or fried meats. Processed meat.Avoid saturated fats. Dairy Full-fat yogurt, cheese, or milk. Beverages Sweetened drinks, such as soda or iced tea. The items listed above may not be a complete list of foods and beverages you should avoid. Contact a dietitian for more information. Questions to ask a health care provider Do I need to meet with a diabetes educator? Do I need to meet with a dietitian? What number can I call if I have questions? When are the best times to check my blood glucose? Where to find more information: American Diabetes Association: diabetes.org Academy of Nutrition and Dietetics: www.eatright.[employer] of Diabetes and Digestive and Kidney Diseases: [employer] Association of Diabetes Care and Education Specialists: www.diabeteseducator.org Summary It is important to have healthy eating habits because your blood sugar (glucose) levels are greatly affected by what you eat and drink. A healthy meal plan will help you control your blood glucose and maintain a healthy lifestyle. Your health care provider may recommend that you work with a dietitian to make a meal plan that is best for you. Keep in mind that carbohydrates (carbs) and alcohol have immediate effects on your blood glucose levels. It is important to count carbs and to use alcohol carefully. This information is not intended to replace advice given to you by your health care provider. Make sure you discuss any questions you have with your healthcare provider. Document Revised: 09/04/2019 Document Reviewed:  09/04/2019 Elsevier Patient Education  2021 [place], MD [facility]

## 2021-04-27 NOTE — Patient Instructions (Signed)
Stop Glimepiride. Continue all other medications. Diabetes Mellitus and Nutrition, Adult When you have diabetes, or diabetes mellitus, it is very important to have healthy eating habits because your blood sugar (glucose) levels are greatly affected by what you eat and drink. Eating healthy foods in the right amounts, at about the same times every day, can help you: Control your blood glucose. Lower your risk of heart disease. Improve your blood pressure. Reach or maintain a healthy weight. What can affect my meal plan? Every person with diabetes is different, and each person has different needs for a meal plan. Your health care provider may recommend that you work with a dietitian to make a meal plan that is best for you. Your meal plan may vary depending on factors such as: The calories you need. The medicines you take. Your weight. Your blood glucose, blood pressure, and cholesterol levels. Your activity level. Other health conditions you have, such as heart or kidney disease. How do carbohydrates affect me? Carbohydrates, also called carbs, affect your blood glucose level more than any other type of food. Eating carbs naturally raises the amount of glucose in your blood. Carb counting is a method for keeping track of how many carbs you eat. Counting carbs is important to keep your blood glucose at a healthy level,especially if you use insulin or take certain oral diabetes medicines. It is important to know how many carbs you can safely have in each meal. This is different for every person. Your dietitian can help you calculate how manycarbs you should have at each meal and for each snack. How does alcohol affect me? Alcohol can cause a sudden decrease in blood glucose (hypoglycemia), especially if you use insulin or take certain oral diabetes medicines. Hypoglycemia can be a life-threatening condition. Symptoms of hypoglycemia, such as sleepiness, dizziness, and confusion, are similar to  symptoms of having too much alcohol. Do not drink alcohol if: Your health care provider tells you not to drink. You are pregnant, may be pregnant, or are planning to become pregnant. If you drink alcohol: Do not drink on an empty stomach. Limit how much you use to: 0-1 drink a day for women. 0-2 drinks a day for men. Be aware of how much alcohol is in your drink. In the [place] one drink equals one 12 oz bottle of beer (355 mL), one 5 oz glass of wine (148 mL), or one 1 oz glass of hard liquor (44 mL). Keep yourself hydrated with water, diet soda, or unsweetened iced tea. Keep in mind that regular soda, juice, and other mixers may contain a lot of sugar and must be counted as carbs. What are tips for following this plan?  Reading food labels Start by checking the serving size on the "Nutrition Facts" label of packaged foods and drinks. The amount of calories, carbs, fats, and other nutrients listed on the label is based on one serving of the item. Many items contain more than one serving per package. Check the total grams (g) of carbs in one serving. You can calculate the number of servings of carbs in one serving by dividing the total carbs by 15. For example, if a food has 30 g of total carbs per serving, it would be equal to 2 servings of carbs. Check the number of grams (g) of saturated fats and trans fats in one serving. Choose foods that have a low amount or none of these fats. Check the number of milligrams (mg) of salt (sodium) in one  serving. Most people should limit total sodium intake to less than 2,300 mg per day. Always check the nutrition information of foods labeled as "low-fat" or "nonfat." These foods may be higher in added sugar or refined carbs and should be avoided. Talk to your dietitian to identify your daily goals for nutrients listed on the label. Shopping Avoid buying canned, pre-made, or processed foods. These foods tend to be high in fat, sodium, and added sugar. Shop  around the outside edge of the grocery store. This is where you will most often find fresh fruits and vegetables, bulk grains, fresh meats, and fresh dairy. Cooking Use low-heat cooking methods, such as baking, instead of high-heat cooking methods like deep frying. Cook using healthy oils, such as olive, canola, or sunflower oil. Avoid cooking with butter, cream, or high-fat meats. Meal planning Eat meals and snacks regularly, preferably at the same times every day. Avoid going long periods of time without eating. Eat foods that are high in fiber, such as fresh fruits, vegetables, beans, and whole grains. Talk with your dietitian about how many servings of carbs you can eat at each meal. Eat 4-6 oz (112-168 g) of lean protein each day, such as lean meat, chicken, fish, eggs, or tofu. One ounce (oz) of lean protein is equal to: 1 oz (28 g) of meat, chicken, or fish. 1 egg.  cup (62 g) of tofu. Eat some foods each day that contain healthy fats, such as avocado, nuts, seeds, and fish. What foods should I eat? Fruits Berries. Apples. Oranges. Peaches. Apricots. Plums. Grapes. Mango. Papaya.Pomegranate. Kiwi. Cherries. Vegetables Lettuce. Spinach. Leafy greens, including kale, chard, collard greens, and mustard greens. Beets. Cauliflower. Cabbage. Broccoli. Carrots. Green beans.Tomatoes. Peppers. Onions. Cucumbers. Brussels sprouts. Grains Whole grains, such as whole-wheat or whole-grain bread, crackers, tortillas,cereal, and pasta. Unsweetened oatmeal. Quinoa. Brown or wild rice. Meats and other proteins Seafood. Poultry without skin. Lean cuts of poultry and beef. Tofu. Nuts. Seeds. Dairy Low-fat or fat-free dairy products such as milk, yogurt, and cheese. The items listed above may not be a complete list of foods and beverages you can eat. Contact a dietitian for more information. What foods should I avoid? Fruits Fruits canned with syrup. Vegetables Canned vegetables. Frozen vegetables  with butter or cream sauce. Grains Refined white flour and flour products such as bread, pasta, snack foods, andcereals. Avoid all processed foods. Meats and other proteins Fatty cuts of meat. Poultry with skin. Breaded or fried meats. Processed meat.Avoid saturated fats. Dairy Full-fat yogurt, cheese, or milk. Beverages Sweetened drinks, such as soda or iced tea. The items listed above may not be a complete list of foods and beverages you should avoid. Contact a dietitian for more information. Questions to ask a health care provider Do I need to meet with a diabetes educator? Do I need to meet with a dietitian? What number can I call if I have questions? When are the best times to check my blood glucose? Where to find more information: American Diabetes Association: diabetes.org Academy of Nutrition and Dietetics: www.eatright.[employer] of Diabetes and Digestive and Kidney Diseases: [employer] Association of Diabetes Care and Education Specialists: www.diabeteseducator.org Summary It is important to have healthy eating habits because your blood sugar (glucose) levels are greatly affected by what you eat and drink. A healthy meal plan will help you control your blood glucose and maintain a healthy lifestyle. Your health care provider may recommend that you work with a dietitian to make a meal plan that  is best for you. Keep in mind that carbohydrates (carbs) and alcohol have immediate effects on your blood glucose levels. It is important to count carbs and to use alcohol carefully. This information is not intended to replace advice given to you by your health care provider. Make sure you discuss any questions you have with your healthcare provider. Document Revised: 09/04/2019 Document Reviewed: 09/04/2019 Elsevier Patient Education  2021 [employer].

## 2021-04-27 NOTE — Assessment & Plan Note (Signed)
Clinically stable.  Clinically euvolemic.  Continue Lasix 20 mg daily.

## 2021-04-27 NOTE — Addendum Note (Signed)
Addended by: Davina Poke on: 04/27/2021 11:12 AM   Modules accepted: Orders

## 2021-04-27 NOTE — Assessment & Plan Note (Signed)
Well-controlled hypertension.  Continue hydrochlorothiazide 25 mg daily and lisinopril 40 mg daily. Well-controlled diabetes with hemoglobin A1c of 5.5. Continue metformin 500 mg twice a day and weekly Trulicity 1.95 mg.  Stop glimepiride. Diet and nutrition discussed.

## 2021-04-27 NOTE — Assessment & Plan Note (Signed)
Diet and nutrition discussed.  Continue atorvastatin 80 mg daily and Praluent every 14 days.

## 2021-04-27 NOTE — Assessment & Plan Note (Signed)
Stable with recent evaluation by sleep apnea doctor.  On CPAP treatment. Doing very well.

## 2021-04-27 NOTE — Assessment & Plan Note (Addendum)
Clinically stable without significant episodes of angina. Continue metoprolol succinate 50 mg twice daily. Continue Plavix 75 mg daily.  Intolerant to aspirin.

## 2021-04-29 ENCOUNTER — Other Ambulatory Visit: Payer: Self-pay | Admitting: Internal Medicine

## 2021-05-10 ENCOUNTER — Other Ambulatory Visit: Payer: Self-pay | Admitting: Emergency Medicine

## 2021-05-10 DIAGNOSIS — G629 Polyneuropathy, unspecified: Secondary | ICD-10-CM

## 2021-05-10 DIAGNOSIS — E1142 Type 2 diabetes mellitus with diabetic polyneuropathy: Secondary | ICD-10-CM

## 2021-06-04 DIAGNOSIS — G4733 Obstructive sleep apnea (adult) (pediatric): Secondary | ICD-10-CM | POA: Diagnosis not present

## 2021-06-05 ENCOUNTER — Other Ambulatory Visit: Payer: Self-pay | Admitting: Internal Medicine

## 2021-06-12 ENCOUNTER — Ambulatory Visit (INDEPENDENT_AMBULATORY_CARE_PROVIDER_SITE_OTHER): Payer: Medicare Other

## 2021-06-12 DIAGNOSIS — I495 Sick sinus syndrome: Secondary | ICD-10-CM

## 2021-06-16 LAB — CUP PACEART REMOTE DEVICE CHECK
Battery Impedance: 1588 Ohm
Battery Remaining Longevity: 52 mo
Battery Voltage: 2.76 V
Brady Statistic AP VP Percent: 0 %
Brady Statistic AP VS Percent: 0 %
Brady Statistic AS VP Percent: 0 %
Brady Statistic AS VS Percent: 100 %
Date Time Interrogation Session: 20220902210511
Implantable Lead Implant Date: 20091201
Implantable Lead Implant Date: 20091201
Implantable Lead Location: 753859
Implantable Lead Location: 753860
Implantable Lead Model: 5076
Implantable Lead Model: 5076
Implantable Pulse Generator Implant Date: 20091201
Lead Channel Impedance Value: 392 Ohm
Lead Channel Impedance Value: 533 Ohm
Lead Channel Pacing Threshold Amplitude: 0.5 V
Lead Channel Pacing Threshold Amplitude: 0.75 V
Lead Channel Pacing Threshold Pulse Width: 0.4 ms
Lead Channel Pacing Threshold Pulse Width: 0.4 ms
Lead Channel Setting Pacing Amplitude: 2 V
Lead Channel Setting Pacing Amplitude: 2.5 V
Lead Channel Setting Pacing Pulse Width: 0.4 ms
Lead Channel Setting Sensing Sensitivity: 4 mV

## 2021-06-17 ENCOUNTER — Other Ambulatory Visit: Payer: Self-pay

## 2021-06-17 ENCOUNTER — Encounter: Payer: Self-pay | Admitting: Internal Medicine

## 2021-06-17 ENCOUNTER — Ambulatory Visit: Payer: Medicare Other | Admitting: Internal Medicine

## 2021-06-17 VITALS — BP 110/84 | HR 61 | Ht 62.0 in | Wt 204.0 lb

## 2021-06-17 DIAGNOSIS — E1169 Type 2 diabetes mellitus with other specified complication: Secondary | ICD-10-CM | POA: Diagnosis not present

## 2021-06-17 DIAGNOSIS — E785 Hyperlipidemia, unspecified: Secondary | ICD-10-CM

## 2021-06-17 DIAGNOSIS — I10 Essential (primary) hypertension: Secondary | ICD-10-CM

## 2021-06-17 DIAGNOSIS — R079 Chest pain, unspecified: Secondary | ICD-10-CM | POA: Insufficient documentation

## 2021-06-17 DIAGNOSIS — I25119 Atherosclerotic heart disease of native coronary artery with unspecified angina pectoris: Secondary | ICD-10-CM

## 2021-06-17 NOTE — Patient Instructions (Signed)
Medication Instructions:   Your physician recommends that you continue on your current medications as directed. Please refer to the Current Medication list given to you today.  *If you need a refill on your cardiac medications before your next appointment, please call your pharmacy*   Lab Work:  None ordered  Testing/Procedures:  [place]  Your caregiver has ordered a Clinical cytogeneticist) Stress Test with nuclear imaging. The purpose of this test is to evaluate the blood supply to your heart muscle. This procedure is referred to as a "Non-Invasive Stress Test." This is because other than having an IV started in your vein, nothing is inserted or "invades" your body. Cardiac stress tests are done to find areas of poor blood flow to the heart by determining the extent of coronary artery disease (CAD). Some patients exercise on a treadmill, which naturally increases the blood flow to your heart, while others who are  unable to walk on a treadmill due to physical limitations have a pharmacologic/chemical stress agent called Lexiscan . This medicine will mimic walking on a treadmill by temporarily increasing your coronary blood flow.   Please note: these test may take anywhere between 2-4 hours to complete  PLEASE REPORT TO [facility] AT THE FIRST DESK WILL DIRECT YOU WHERE TO GO  Date of Procedure:_____________________________________  Arrival Time for Procedure:______________________________  Instructions regarding medication:   __X__ : Hold diabetes medication morning of procedure - Metformin, Glimepiride  ____:  Hold betablocker(s) night before procedure and morning of procedure  __X__:  Hold other medications as follows: Furosemide, Hydrochlorothiazide (HCTZ)    PLEASE NOTIFY THE OFFICE AT LEAST 24 HOURS IN ADVANCE IF YOU ARE UNABLE TO KEEP YOUR APPOINTMENT.  [phone] AND  PLEASE NOTIFY NUCLEAR MEDICINE AT [facility] AT LEAST 24 HOURS IN ADVANCE IF  YOU ARE UNABLE TO KEEP YOUR APPOINTMENT. [phone]  How to prepare for your Myoview test:  Do not eat or drink after midnight No caffeine for 24 hours prior to test No smoking 24 hours prior to test. Your medication may be taken with water.  If your doctor stopped a medication because of this test, do not take that medication. Ladies, please do not wear dresses.  Skirts or pants are appropriate. Please wear a short sleeve shirt. No perfume, cologne or lotion.    Follow-Up: At [facility], you and your health needs are our priority.  As part of our continuing mission to provide you with exceptional heart care, we have created designated Provider Care Teams.  These Care Teams include your primary Cardiologist (physician) and Advanced Practice Providers (APPs -  Physician Assistants and Nurse Practitioners) who all work together to provide you with the care you need, when you need it.  We recommend signing up for the patient portal called "MyChart".  Sign up information is provided on this After Visit Summary.  MyChart is used to connect with patients for Virtual Visits (Telemedicine).  Patients are able to view lab/test results, encounter notes, upcoming appointments, etc.  Non-urgent messages can be sent to your provider as well.   To learn more about what you can do with MyChart, go to [email or web address].    Your next appointment:   6 month(s)  The format for your next appointment:   In Person  Provider:   You may see Nelva Bush, MD or one of the following Advanced Practice Providers on your designated Care Team:   Murray Hodgkins, NP Christell Faith, PA-C Marrianne Mood, PA-C  Cadence Kathlen Mody, PA-C

## 2021-06-17 NOTE — Progress Notes (Signed)
Follow-up Outpatient Visit Date: 06/17/2021  Primary Care Provider: Horald Pollen, MD [place] [id number]  Chief Complaint: Chest discomfort  HPI:  Ms. Robards is a 65 y.o. female with history of coronary artery disease status post CABG (07/2016) with postoperative atrial fibrillation, chronic HFpEF, hypertension, hyperlipidemia, HFpEF, sinus node dysfunction status post permanent pacemaker, and sleep apnea on CPAP, who presents for follow-up of her disease and HFpEF.  I last saw her in March, at which time she was doing well.  Today, Ms. Kohlmeyer reports that she is feeling "great."  However, on further questioning she reports vague discomfort in the center of her chest that is reminiscent of what she felt before her bypass in 2017.  It is not exertional and typically only lasts a few minutes at a time.  She often finds herself rubbing her sternum when the chest pain occurs.  She notes that 4-5 days a week.  There are no associated symptoms.  She has not had any shortness of breath, palpitations, lightheadedness, or edema.  Home blood pressures have been normal.  She is tolerating her medications well.  --------------------------------------------------------------------------------------------------  Past Medical History:  Diagnosis Date   Allergy    Anal fissure    Anxiety    Arthritis    Asthma    Cataract 2010    left   Colon polyps    diverticulosis  03-2011/2005   Depression    Diabetes mellitus 2012   T2DM   Diverticulosis    Fatty liver 04/2011   GERD (gastroesophageal reflux disease)    Hyperlipidemia    Hypertension severe    Migraine    Obesity    OSA (obstructive sleep apnea)    C-Pap   Pacemaker Medtronic    Sinoatrial node dysfunction ([place])    syncope   Past Surgical History:  Procedure Laterality Date   ABDOMINAL HYSTERECTOMY  2000   total   CARDIAC CATHETERIZATION N/A 07/20/2016   Procedure: Left Heart Cath and Coronary Angiography;   Surgeon: Peter M Martinique, MD;  Location: [place] [facility];  Service: Cardiovascular;  Laterality: N/A;   CATARACT EXTRACTION Left    CORONARY ARTERY BYPASS GRAFT N/A 07/22/2016   Procedure: CORONARY ARTERY BYPASS GRAFTING (CABG) x three , using left internal mammary artery and right leg greater saphenous vein harvested endoscopically;  Surgeon: Ivin Poot, MD;  Location: [place];  Service: Open Heart Surgery;  Laterality: N/A;   EYE SURGERY Right 2010   MULTIPLE TOOTH EXTRACTIONS     OOPHORECTOMY     PACEMAKER PLACEMENT     T/A right knee     TEE WITHOUT CARDIOVERSION N/A 07/22/2016   Procedure: TRANSESOPHAGEAL ECHOCARDIOGRAM (TEE);  Surgeon: Ivin Poot, MD;  Location: [place];  Service: Open Heart Surgery;  Laterality: N/A;   TONSILLECTOMY      Current Meds  Medication Sig   Accu-Chek Softclix Lancets lancets USE AS DIRECTED   albuterol (VENTOLIN HFA) 108 (90 Base) MCG/ACT inhaler Inhale 1-2 puffs into the lungs every 6 (six) hours as needed for wheezing or shortness of breath.   atorvastatin (LIPITOR) 80 MG tablet TAKE 1 TABLET BY MOUTH ONCE DAILY AT 6 PM   Blood Glucose Monitoring Suppl (ACCU-CHEK GUIDE) w/Device KIT 1 Device by Does not apply route in the morning, at noon, and at bedtime.   cetirizine (ZYRTEC) 10 MG tablet Take 10 mg by mouth daily.   citalopram (CELEXA) 20 MG tablet TAKE 1 TABLET BY MOUTH  DAILY   clopidogrel (PLAVIX) 75 MG tablet TAKE 1 TABLET BY MOUTH  DAILY   cyclobenzaprine (FLEXERIL) 10 MG tablet TAKE 1 TABLET BY MOUTH AT  BEDTIME AS NEEDED AND MAY  REPEAT DOSE ONE TIME IF  NEEDED FOR MUSCLE SPASMS  (CAREFUL OF SEDATION)   Dulaglutide (TRULICITY) 8.85 OY/7.7AJ SOPN Inject 0.75 mg into the skin once a week.   fluticasone (FLONASE) 50 MCG/ACT nasal spray USE 2 SPRAYS IN BOTH  NOSTRILS DAILY   fluticasone (FLOVENT HFA) 110 MCG/ACT inhaler Inhale 2 puffs into the lungs 2 (two) times daily.   furosemide (LASIX) 20 MG tablet TAKE 1 TABLET BY MOUTH  DAILY AS  NEEDED FOR EDEMA,  SWELLING   gabapentin (NEURONTIN) 300 MG capsule TAKE 2 CAPSULES BY MOUTH IN THE MORNING AND 3 CAPSULES  AT BEDTIME   glimepiride (AMARYL) 4 MG tablet TAKE ONE-HALF TABLET BY  MOUTH TWICE DAILY   glucose blood test strip Use as instructed   hydrochlorothiazide (HYDRODIURIL) 25 MG tablet TAKE 1 TABLET BY MOUTH  DAILY   hydrocortisone cream 1 % Apply 1 application topically 2 (two) times daily.   lidocaine (XYLOCAINE) 2 % solution Use as directed 5-10 mLs in the mouth or throat every 3 (three) hours as needed.   lisinopril (ZESTRIL) 40 MG tablet TAKE 1 TABLET BY MOUTH  DAILY   metFORMIN (GLUCOPHAGE) 500 MG tablet TAKE 1 TABLET BY MOUTH  TWICE DAILY WITH A MEAL   metoprolol succinate (TOPROL-XL) 50 MG 24 hr tablet TAKE 1 TABLET BY MOUTH  TWICE DAILY WITH OR  IMMEDIATELY FOLLOWING A  MEAL   Multiple Vitamin (MULTIVITAMIN) tablet Take 1 tablet by mouth daily.   nystatin cream (MYCOSTATIN) Apply 1 application topically as needed for dry skin.   omeprazole (PRILOSEC) 20 MG capsule Take 1 capsule (20 mg total) by mouth daily.   ondansetron (ZOFRAN-ODT) 8 MG disintegrating tablet DISSOLVE 1 ON TONGUE EVERY 12 HOURS AS NEEDED FOR NAUSEA   polycarbophil (FIBERCON) 625 MG tablet Take by mouth daily.   PRALUENT 75 MG/ML SOAJ INJECT 75 MG SUBCUTANEOUSLY EVERY 14 DAYS   traMADol (ULTRAM) 50 MG tablet TAKE 1 TABLET BY MOUTH AT  BEDTIME AS NEEDED   triamcinolone cream (KENALOG) 0.1 % Apply 1 application topically as needed.    Allergies: Aspirin, Adhesive [tape], Penicillins, Red yeast rice, and Sulfa drugs cross reactors  Social History   Tobacco Use   Smoking status: Former    Types: Cigarettes    Quit date: 1995    Years since quitting: 27.7   Smokeless tobacco: Never   Tobacco comments:    Quit 20 yrs ago  Vaping Use   Vaping Use: Never used  Substance Use Topics   Alcohol use: No    Alcohol/week: 0.0 standard drinks   Drug use: No    Family History  Problem Relation  Age of Onset   Heart disease Mother    Cancer Mother 77       lung   Breast cancer Mother 82   Heart disease Father    Heart attack Sister    Diabetes Sister        type 2   Atrial fibrillation Sister    Heart attack Brother    Heart attack Brother    Cancer Maternal Grandmother        breast   Breast cancer Maternal Grandmother    Leukemia Paternal Grandmother        neoplast   Aneurysm Paternal Grandfather  abdominal (stomach)   Colon cancer Neg Hx     Review of Systems: A 12-system review of systems was performed and was negative except as noted in the HPI.  --------------------------------------------------------------------------------------------------  Physical Exam: BP 110/84 (BP Location: Left Arm, Patient Position: Sitting, Cuff Size: Large)   Pulse 61   Ht '5\' 2"'  (1.575 m)   Wt 204 lb (92.5 kg)   SpO2 95%   BMI 37.31 kg/m   General:  NAD. Neck: No JVD or HJR. Lungs: Clear to auscultation bilaterally without wheezes or crackles. Heart: Regular rate and rhythm without murmurs, rubs, or gallops.  No chest wall tenderness. Abdomen: Soft, nontender, nondistended. Extremities: No lower extremity edema.  EKG: Normal sinus rhythm with first-degree AV block and low voltage.  PR interval has lengthened slightly since 12/10/2020.  Otherwise, no significant interval change.  Lab Results  Component Value Date   WBC WILL FOLLOW 09/15/2016   HGB WILL FOLLOW 09/15/2016   HCT WILL FOLLOW 09/15/2016   MCV WILL FOLLOW 09/15/2016   PLT WILL FOLLOW 09/15/2016    Lab Results  Component Value Date   NA 140 10/30/2020   K 4.5 10/30/2020   CL 101 10/30/2020   CO2 24 10/30/2020   BUN 24 10/30/2020   CREATININE 0.95 10/30/2020   GLUCOSE 128 (H) 10/30/2020   ALT 19 10/30/2020    Lab Results  Component Value Date   CHOL 129 10/30/2020   HDL 40 10/30/2020   LDLCALC 45 10/30/2020   LDLDIRECT 54 08/29/2019   TRIG 288 (H) 10/30/2020   CHOLHDL 3.2 10/30/2020     --------------------------------------------------------------------------------------------------  ASSESSMENT AND PLAN: Coronary artery disease with atypical angina: Ms. Urbas overall feels fairly well but notes vague nonexertional chest discomfort that is reminiscent of what she experienced prior to her CABG in 2017.  Physical exam and EKG today are largely unrevealing other than slight interval progression of PR interval.  We have discussed further evaluation options and have agreed to obtain a pharmacologic myocardial perfusion stress test.  We will defer medication changes today.  Hypertension: Blood pressure reasonably well controlled in the office today and at home.  No medication changes.  Hyperlipidemia associated with type 2 diabetes mellitus: Most recent lipid panel in 10/2020 showed excellent LDL control.  Triglycerides were mildly elevated albeit not in a fasting setting.  We will continue current regimen of atorvastatin and Praluent for target LDL less than 70.  Shared Decision Making/Informed Consent The risks [chest pain, shortness of breath, cardiac arrhythmias, dizziness, blood pressure fluctuations, myocardial infarction, stroke/transient ischemic attack, nausea, vomiting, allergic reaction, radiation exposure, metallic taste sensation and life-threatening complications (estimated to be 1 in 10,000)], benefits (risk stratification, diagnosing coronary artery disease, treatment guidance) and alternatives of a nuclear stress test were discussed in detail with Ms. Haydu and she agrees to proceed.  Follow-up: Return to clinic in 6 months, sooner if symptoms worsen or significant abnormality detected on stress test.  Nelva Bush, MD 06/17/2021 12:15 PM

## 2021-06-22 ENCOUNTER — Other Ambulatory Visit: Payer: Self-pay

## 2021-06-22 ENCOUNTER — Encounter
Admission: RE | Admit: 2021-06-22 | Discharge: 2021-06-22 | Disposition: A | Discharge status: Home / Self Care | Payer: Medicare Other | Source: Ambulatory Visit | Attending: Internal Medicine | Admitting: Internal Medicine

## 2021-06-22 DIAGNOSIS — R079 Chest pain, unspecified: Secondary | ICD-10-CM | POA: Insufficient documentation

## 2021-06-22 DIAGNOSIS — I25119 Atherosclerotic heart disease of native coronary artery with unspecified angina pectoris: Secondary | ICD-10-CM | POA: Diagnosis not present

## 2021-06-22 LAB — NM MYOCAR MULTI W/SPECT W/WALL MOTION / EF
Base ST Depression (mm): 0 mm
LV dias vol: 71 mL (ref 46–106)
LV sys vol: 23 mL
Nuc Stress EF: 68 %
Peak HR: 89 {beats}/min
Rest HR: 64 {beats}/min
Rest Nuclear Isotope Dose: 10 mCi
SDS: 0
SRS: 0
SSS: 2
ST Depression (mm): 0 mm
Stress Nuclear Isotope Dose: 30.9 mCi
TID: 0.93

## 2021-06-22 MED ORDER — TECHNETIUM TC 99M TETROFOSMIN IV KIT
10.0000 | PACK | Freq: Once | INTRAVENOUS | Status: AC | PRN
  Administered 2021-06-22: 10 via INTRAVENOUS

## 2021-06-22 MED ORDER — TECHNETIUM TC 99M TETROFOSMIN IV KIT
30.8500 | PACK | Freq: Once | INTRAVENOUS | Status: AC | PRN
  Administered 2021-06-22: 30.85 via INTRAVENOUS

## 2021-06-22 MED ORDER — REGADENOSON 0.4 MG/5ML IV SOLN
0.4000 mg | Freq: Once | INTRAVENOUS | Status: AC
  Administered 2021-06-22: 0.4 mg via INTRAVENOUS
  Filled 2021-06-22: qty 5

## 2021-06-24 NOTE — Progress Notes (Signed)
Remote pacemaker transmission.   

## 2021-07-03 ENCOUNTER — Other Ambulatory Visit: Payer: Self-pay | Admitting: Internal Medicine

## 2021-07-04 ENCOUNTER — Other Ambulatory Visit: Payer: Self-pay | Admitting: Internal Medicine

## 2021-07-04 ENCOUNTER — Other Ambulatory Visit: Payer: Self-pay | Admitting: Emergency Medicine

## 2021-07-04 DIAGNOSIS — E1142 Type 2 diabetes mellitus with diabetic polyneuropathy: Secondary | ICD-10-CM

## 2021-07-09 DIAGNOSIS — K580 Irritable bowel syndrome with diarrhea: Secondary | ICD-10-CM | POA: Diagnosis not present

## 2021-08-02 ENCOUNTER — Other Ambulatory Visit: Payer: Self-pay | Admitting: Emergency Medicine

## 2021-08-02 DIAGNOSIS — Z9109 Other allergy status, other than to drugs and biological substances: Secondary | ICD-10-CM

## 2021-08-02 DIAGNOSIS — G629 Polyneuropathy, unspecified: Secondary | ICD-10-CM

## 2021-08-24 DIAGNOSIS — H524 Presbyopia: Secondary | ICD-10-CM | POA: Diagnosis not present

## 2021-08-24 DIAGNOSIS — H43813 Vitreous degeneration, bilateral: Secondary | ICD-10-CM | POA: Diagnosis not present

## 2021-08-24 DIAGNOSIS — H26492 Other secondary cataract, left eye: Secondary | ICD-10-CM | POA: Diagnosis not present

## 2021-08-24 DIAGNOSIS — E119 Type 2 diabetes mellitus without complications: Secondary | ICD-10-CM | POA: Diagnosis not present

## 2021-08-24 LAB — HM DIABETES EYE EXAM

## 2021-09-11 ENCOUNTER — Ambulatory Visit (INDEPENDENT_AMBULATORY_CARE_PROVIDER_SITE_OTHER): Payer: Medicare Other

## 2021-09-11 DIAGNOSIS — I495 Sick sinus syndrome: Secondary | ICD-10-CM

## 2021-09-14 ENCOUNTER — Encounter: Payer: Self-pay | Admitting: Emergency Medicine

## 2021-09-14 LAB — CUP PACEART REMOTE DEVICE CHECK
Battery Impedance: 1584 Ohm
Battery Remaining Longevity: 52 mo
Battery Voltage: 2.76 V
Brady Statistic AP VP Percent: 0 %
Brady Statistic AP VS Percent: 0 %
Brady Statistic AS VP Percent: 0 %
Brady Statistic AS VS Percent: 100 %
Date Time Interrogation Session: 20221204182824
Implantable Lead Implant Date: 20091201
Implantable Lead Implant Date: 20091201
Implantable Lead Location: 753859
Implantable Lead Location: 753860
Implantable Lead Model: 5076
Implantable Lead Model: 5076
Implantable Pulse Generator Implant Date: 20091201
Lead Channel Impedance Value: 430 Ohm
Lead Channel Impedance Value: 567 Ohm
Lead Channel Pacing Threshold Amplitude: 0.5 V
Lead Channel Pacing Threshold Amplitude: 0.875 V
Lead Channel Pacing Threshold Pulse Width: 0.4 ms
Lead Channel Pacing Threshold Pulse Width: 0.4 ms
Lead Channel Setting Pacing Amplitude: 2 V
Lead Channel Setting Pacing Amplitude: 2.5 V
Lead Channel Setting Pacing Pulse Width: 0.4 ms
Lead Channel Setting Sensing Sensitivity: 5.6 mV

## 2021-09-22 NOTE — Progress Notes (Signed)
Remote pacemaker transmission.   

## 2021-09-28 DIAGNOSIS — G4733 Obstructive sleep apnea (adult) (pediatric): Secondary | ICD-10-CM | POA: Diagnosis not present

## 2021-10-01 ENCOUNTER — Telehealth: Payer: Self-pay | Admitting: Emergency Medicine

## 2021-10-01 ENCOUNTER — Other Ambulatory Visit: Payer: Self-pay | Admitting: Internal Medicine

## 2021-10-01 NOTE — Telephone Encounter (Signed)
Patient requesting a call back to discuss grant for trulicity  Patient stated she also have questions regarding other matters, but would not go into details

## 2021-10-07 NOTE — Telephone Encounter (Signed)
Called and left vm to call back to discuss Trulicity.

## 2021-10-28 ENCOUNTER — Ambulatory Visit (INDEPENDENT_AMBULATORY_CARE_PROVIDER_SITE_OTHER): Payer: Medicare Other | Admitting: Emergency Medicine

## 2021-10-28 ENCOUNTER — Encounter: Payer: Self-pay | Admitting: Emergency Medicine

## 2021-10-28 ENCOUNTER — Other Ambulatory Visit: Payer: Self-pay

## 2021-10-28 VITALS — BP 128/82 | HR 56 | Ht 62.0 in | Wt 195.0 lb

## 2021-10-28 DIAGNOSIS — I152 Hypertension secondary to endocrine disorders: Secondary | ICD-10-CM

## 2021-10-28 DIAGNOSIS — M25512 Pain in left shoulder: Secondary | ICD-10-CM | POA: Diagnosis not present

## 2021-10-28 DIAGNOSIS — I2510 Atherosclerotic heart disease of native coronary artery without angina pectoris: Secondary | ICD-10-CM | POA: Diagnosis not present

## 2021-10-28 DIAGNOSIS — E1169 Type 2 diabetes mellitus with other specified complication: Secondary | ICD-10-CM

## 2021-10-28 DIAGNOSIS — I5032 Chronic diastolic (congestive) heart failure: Secondary | ICD-10-CM

## 2021-10-28 DIAGNOSIS — G8929 Other chronic pain: Secondary | ICD-10-CM | POA: Diagnosis not present

## 2021-10-28 DIAGNOSIS — E785 Hyperlipidemia, unspecified: Secondary | ICD-10-CM

## 2021-10-28 DIAGNOSIS — I25119 Atherosclerotic heart disease of native coronary artery with unspecified angina pectoris: Secondary | ICD-10-CM | POA: Diagnosis not present

## 2021-10-28 DIAGNOSIS — G4733 Obstructive sleep apnea (adult) (pediatric): Secondary | ICD-10-CM

## 2021-10-28 DIAGNOSIS — E1159 Type 2 diabetes mellitus with other circulatory complications: Secondary | ICD-10-CM

## 2021-10-28 DIAGNOSIS — G43109 Migraine with aura, not intractable, without status migrainosus: Secondary | ICD-10-CM | POA: Diagnosis not present

## 2021-10-28 LAB — POCT GLYCOSYLATED HEMOGLOBIN (HGB A1C): Hemoglobin A1C: 5.6 % (ref 4.0–5.6)

## 2021-10-28 MED ORDER — ONDANSETRON 8 MG PO TBDP: ORAL_TABLET | ORAL | 3 refills | Status: DC

## 2021-10-28 NOTE — Assessment & Plan Note (Signed)
Stable.  On CPAP treatment. 

## 2021-10-28 NOTE — Assessment & Plan Note (Signed)
Mild musculoskeletal injury.  Making progress and slowly improving. May take acetaminophen for pain.  Avoid NSAIDs.

## 2021-10-28 NOTE — Assessment & Plan Note (Addendum)
No anginal episodes.  Stable.  Continue Plavix 75 mg daily along with beta-blocker.

## 2021-10-28 NOTE — Progress Notes (Signed)
[name] [name] 66 y.o.   Chief Complaint  Patient presents with   Diabetes    6 month f /u, shoulder pain left side   Medication Refill    zofran    HISTORY OF PRESENT ILLNESS: This is a 66 y.o. female here for diabetic follow-up. Doing well.  Eating better and losing weight.  On Trulicity and metformin. Complaining of left shoulder pain for the past several weeks after minor injury.  Slowly improving. Requesting refill for Zofran. No other complaints or medical concerns today.  Diabetes Pertinent negatives for hypoglycemia include no dizziness or headaches. Pertinent negatives for diabetes include no chest pain.  Medication Refill Pertinent negatives include no abdominal pain, chest pain, chills, congestion, coughing, fever, headaches, nausea, rash, sore throat or vomiting.    Prior to Admission medications   Medication Sig Start Date End Date Taking? Authorizing Provider  Accu-Chek Softclix Lancets lancets USE AS DIRECTED 05/10/21  Yes Marcques Wrightsman, Ines Bloomer, MD  albuterol (VENTOLIN HFA) 108 (90 Base) MCG/ACT inhaler Inhale 1-2 puffs into the lungs every 6 (six) hours as needed for wheezing or shortness of breath. 04/17/19  Yes Kaytlen Lightsey, Ines Bloomer, MD  atorvastatin (LIPITOR) 80 MG tablet TAKE 1 TABLET BY MOUTH ONCE DAILY AT 6 PM 01/25/21  Yes Kamaljit Hizer, Ines Bloomer, MD  Blood Glucose Monitoring Suppl (ACCU-CHEK GUIDE) w/Device KIT 1 Device by Does not apply route in the morning, at noon, and at bedtime. 02/26/20  Yes Lodie Waheed, Ines Bloomer, MD  cetirizine (ZYRTEC) 10 MG tablet Take 10 mg by mouth daily.   Yes [provider]  citalopram (CELEXA) 20 MG tablet TAKE 1 TABLET BY MOUTH  DAILY 01/15/21  Yes Azion Centrella, Ines Bloomer, MD  clopidogrel (PLAVIX) 75 MG tablet TAKE 1 TABLET BY MOUTH  DAILY 04/26/21  Yes Loel Dubonnet, NP  cyclobenzaprine (FLEXERIL) 10 MG tablet TAKE 1 TABLET BY MOUTH AT  BEDTIME AS NEEDED AND MAY  REPEAT DOSE ONE TIME IF  NEEDED FOR MUSCLE SPASMS  (CAREFUL OF  SEDATION) 02/14/20  Yes Geralene Afshar, Ines Bloomer, MD  dicyclomine (BENTYL) 20 MG tablet Take 20 mg by mouth 3 (three) times daily. 07/11/21  Yes [provider]  Dulaglutide (TRULICITY) 7.49 SW/9.6PR SOPN Inject 0.75 mg into the skin once a week. 10/30/19  Yes Rosalio Catterton, Ines Bloomer, MD  fluticasone [facility]) 50 MCG/ACT nasal spray USE 2 SPRAYS IN BOTH  NOSTRILS DAILY 08/03/21  Yes Aaima Gaddie, Ines Bloomer, MD  fluticasone (FLOVENT HFA) 110 MCG/ACT inhaler Inhale 2 puffs into the lungs 2 (two) times daily. 04/17/19  Yes Tory Septer, Ines Bloomer, MD  furosemide (LASIX) 20 MG tablet TAKE 1 TABLET BY MOUTH  DAILY AS NEEDED FOR EDEMA,  SWELLING 07/06/21  Yes End, Harrell Gave, MD  gabapentin (NEURONTIN) 300 MG capsule TAKE 2 CAPSULES BY MOUTH IN THE MORNING AND 3 CAPSULES  AT BEDTIME 05/10/21  Yes Aqeel Norgaard, Ines Bloomer, MD  glucose blood test strip Use as instructed 10/29/19  Yes Elmira Olkowski, Ines Bloomer, MD  hydrochlorothiazide (HYDRODIURIL) 25 MG tablet TAKE 1 TABLET BY MOUTH  DAILY 07/06/21  Yes End, Harrell Gave, MD  hydrocortisone cream 1 % Apply 1 application topically 2 (two) times daily.   Yes [provider]  lisinopril (ZESTRIL) 40 MG tablet TAKE 1 TABLET BY MOUTH  DAILY 01/25/21  Yes Adriane Guglielmo, Ines Bloomer, MD  metFORMIN (GLUCOPHAGE) 500 MG tablet TAKE 1 TABLET BY MOUTH  TWICE DAILY WITH A MEAL 07/05/21  Yes Equan Cogbill, Ines Bloomer, MD  metoprolol succinate (TOPROL-XL) 50 MG 24 hr tablet TAKE  1 TABLET BY MOUTH  TWICE DAILY WITH OR  IMMEDIATELY FOLLOWING A  MEAL 03/29/21  Yes Makenly Larabee, Ines Bloomer, MD  Multiple Vitamin (MULTIVITAMIN) tablet Take 1 tablet by mouth daily. 08/30/16  Yes Lars Pinks M, PA-C  nystatin cream (MYCOSTATIN) Apply 1 application topically as needed for dry skin. 04/28/20  Yes Alanzo Lamb, Ines Bloomer, MD  omeprazole (PRILOSEC) 20 MG capsule Take 1 capsule (20 mg total) by mouth daily. 12/15/16  Yes English, Colletta [place] D, PA  ondansetron (ZOFRAN-ODT) 8 MG disintegrating tablet  DISSOLVE 1 ON TONGUE EVERY 12 HOURS AS NEEDED FOR NAUSEA 04/17/19  Yes Tyteanna Ost, Ines Bloomer, MD  polycarbophil (FIBERCON) 625 MG tablet Take by mouth daily.   Yes [provider]  PRALUENT 75 MG/ML SOAJ INJECT 75MG SUBCUTANEOUSLY EVERY 14 DAYS 10/01/21  Yes End, Harrell Gave, MD  traMADol (ULTRAM) 50 MG tablet TAKE 1 TABLET BY MOUTH AT  BEDTIME AS NEEDED 08/03/21  Yes Carlus Stay, Ines Bloomer, MD  triamcinolone cream (KENALOG) 0.1 % Apply 1 application topically as needed. 04/17/19  Yes Chene Kasinger, Ines Bloomer, MD  glimepiride (AMARYL) 4 MG tablet TAKE ONE-HALF TABLET BY  MOUTH TWICE DAILY 10/25/20   Horald Pollen, MD  lidocaine (XYLOCAINE) 2 % solution Use as directed 5-10 mLs in the mouth or throat every 3 (three) hours as needed. 04/17/19   Horald Pollen, MD    Allergies  Allergen Reactions   Aspirin Other (See Comments)    Sores in mouth Sores in mouth   Adhesive [Tape] Other (See Comments)    SKIN BLISTERS   Penicillins     Swelling    Red Yeast Rice Hives   Sulfa Drugs Cross Reactors Hives    Patient Active Problem List   Diagnosis Date Noted   Chest pain 06/17/2021   Hyperlipidemia associated with type 2 diabetes mellitus ([place]) 12/10/2020   Trigger finger, right middle finger 11/07/2019   Hypertension associated with diabetes ([place]) 10/29/2019   Chronic heart failure with preserved ejection fraction (HFpEF) ([place]) 05/25/2019   Coronary artery disease involving native coronary artery of native heart with angina pectoris ([place]) 02/14/2018   Morbid obesity ([place]) 02/14/2018   Irritable bowel syndrome 06/28/2017   S/P CABG x 3 07/22/2016   Multiple vessel coronary artery disease 07/21/2016   STEMI involving left circumflex coronary artery ([place]) 07/20/2016   Environmental allergies 04/26/2016   Anxiety 04/26/2016   Sinus node dysfunction (HCC) 04/26/2016   Pacemaker Medtronic    OSA (obstructive sleep apnea) 05/25/2011   Hx of adenomatous colonic polyps 05/25/2011    Migraine 05/25/2011   Diabetes mellitus type 2, uncontrolled, with complications 91/47/8295   GERD (gastroesophageal reflux disease) 05/25/2011   NAFLD (nonalcoholic fatty liver disease) 05/25/2011   Hyperlipidemia LDL goal <70 05/25/2011   Essential hypertension 62/13/0865   DIASTOLIC HEART FAILURE, CHRONIC 09/16/2009    Past Medical History:  Diagnosis Date   Allergy    Anal fissure    Anxiety    Arthritis    Asthma    Cataract 2010    left   Colon polyps    diverticulosis  03-2011/2005   Depression    Diabetes mellitus 2012   T2DM   Diverticulosis    Fatty liver 04/2011   GERD (gastroesophageal reflux disease)    Hyperlipidemia    Hypertension severe    Migraine    Obesity    OSA (obstructive sleep apnea)    C-Pap   Pacemaker Medtronic    Sinoatrial node dysfunction ([place])  syncope    Past Surgical History:  Procedure Laterality Date   ABDOMINAL HYSTERECTOMY  2000   total   CARDIAC CATHETERIZATION N/A 07/20/2016   Procedure: Left Heart Cath and Coronary Angiography;  Surgeon: Peter M Martinique, MD;  Location: [facility];  Service: Cardiovascular;  Laterality: N/A;   CATARACT EXTRACTION Left    CORONARY ARTERY BYPASS GRAFT N/A 07/22/2016   Procedure: CORONARY ARTERY BYPASS GRAFTING (CABG) x three , using left internal mammary artery and right leg greater saphenous vein harvested endoscopically;  Surgeon: Ivin Poot, MD;  Location: [place];  Service: Open Heart Surgery;  Laterality: N/A;   EYE SURGERY Right 2010   MULTIPLE TOOTH EXTRACTIONS     OOPHORECTOMY     PACEMAKER PLACEMENT     T/A right knee     TEE WITHOUT CARDIOVERSION N/A 07/22/2016   Procedure: TRANSESOPHAGEAL ECHOCARDIOGRAM (TEE);  Surgeon: Ivin Poot, MD;  Location: [place];  Service: Open Heart Surgery;  Laterality: N/A;   TONSILLECTOMY      Social History   Socioeconomic History   Marital status: Single    Spouse name: Not on file   Number of children: 0   Years of education:  Not on file   Highest education level: Not on file  Occupational History   Occupation: chef    Employer: [employer]  Tobacco Use   Smoking status: Former    Types: Cigarettes    Quit date: 1995    Years since quitting: 28.0   Smokeless tobacco: Never   Tobacco comments:    Quit 20 yrs ago  Vaping Use   Vaping Use: Never used  Substance and Sexual Activity   Alcohol use: No    Alcohol/week: 0.0 standard drinks   Drug use: No   Sexual activity: Yes    Birth control/protection: Post-menopausal, Other-see comments    Comment: Monogamous   Other Topics Concern   Not on file  Social History Narrative   Single. Exercise: No. Education: College.   Social Determinants of Health   Financial Resource Strain: Not on file  Food Insecurity: Not on file  Transportation Needs: Not on file  Physical Activity: Not on file  Stress: Not on file  Social Connections: Not on file  Intimate Partner Violence: Not on file    Family History  Problem Relation Age of Onset   Heart disease Mother    Cancer Mother 60       lung   Breast cancer Mother 43   Heart disease Father    Heart attack Sister    Diabetes Sister        type 2   Atrial fibrillation Sister    Heart attack Brother    Heart attack Brother    Cancer Maternal Grandmother        breast   Breast cancer Maternal Grandmother    Leukemia Paternal Grandmother        neoplast   Aneurysm Paternal Grandfather        abdominal (stomach)   Colon cancer Neg Hx      Review of Systems  Constitutional: Negative.  Negative for chills and fever.  HENT: Negative.  Negative for congestion and sore throat.   Eyes: Negative.   Respiratory: Negative.  Negative for cough and shortness of breath.   Cardiovascular: Negative.  Negative for chest pain and palpitations.  Gastrointestinal: Negative.  Negative for abdominal pain, diarrhea, nausea and vomiting.  Genitourinary: Negative.  Negative  for dysuria and hematuria.   Musculoskeletal:  Positive for joint pain (Left shoulder).  Skin: Negative.  Negative for rash.  Neurological:  Negative for dizziness and headaches.  All other systems reviewed and are negative. Today's Vitals   10/28/21 1115  BP: 128/82  Pulse: (!) 56  SpO2: 96%  Weight: 195 lb (88.5 kg)  Height: '5\' 2"'  (1.575 m)   Body mass index is 35.67 kg/m.   Physical Exam Vitals reviewed.  Constitutional:      Appearance: Normal appearance.  HENT:     Head: Normocephalic.  Eyes:     Extraocular Movements: Extraocular movements intact.     Pupils: Pupils are equal, round, and reactive to light.  Cardiovascular:     Rate and Rhythm: Normal rate and regular rhythm.     Pulses: Normal pulses.     Heart sounds: Normal heart sounds.  Pulmonary:     Effort: Pulmonary effort is normal.     Breath sounds: Normal breath sounds.  Abdominal:     General: Bowel sounds are normal. There is no distension.     Palpations: Abdomen is soft.     Tenderness: There is no abdominal tenderness.  Musculoskeletal:     Cervical back: Neck supple. No tenderness.     Comments: Left shoulder: No swelling.  No erythema.  No localized tenderness.  Limited range of motion due to pain.  Lymphadenopathy:     Cervical: No cervical adenopathy.  Skin:    General: Skin is warm and dry.     Capillary Refill: Capillary refill takes less than 2 seconds.  Neurological:     General: No focal deficit present.     Mental Status: She is alert and oriented to person, place, and time.  Psychiatric:        Mood and Affect: Mood normal.        Behavior: Behavior normal.   Results for orders placed or performed in visit on 10/28/21 (from the past 24 hour(s))  POCT glycosylated hemoglobin (Hb A1C)     Status: None   Collection Time: 10/28/21 11:35 AM  Result Value Ref Range   Hemoglobin A1C 5.6 4.0 - 5.6 %   HbA1c POC (<> result, manual entry)     HbA1c, POC (prediabetic range)     HbA1c, POC (controlled diabetic  range)       ASSESSMENT & PLAN: Problem List Items Addressed This Visit       Cardiovascular and Mediastinum   Migraine   Relevant Medications   ondansetron (ZOFRAN-ODT) 8 MG disintegrating tablet   Coronary artery disease involving native coronary artery of native heart with angina pectoris (HCC)    No anginal episodes.  Stable.  Continue Plavix 75 mg daily along with beta-blocker.      Chronic heart failure with preserved ejection fraction (HFpEF) (HCC)    Clinically euvolemic.  No signs of congestive heart failure.      Hypertension associated with diabetes ([place]) - Primary    Well-controlled hypertension.  Continue lisinopril 40 mg, hydrochlorothiazide 25 mg, and metoprolol succinate 50 mg daily. BP Readings from Last 3 Encounters:  10/28/21 128/82  06/17/21 110/84  04/27/21 132/70  Well-controlled diabetes with hemoglobin A1c of 5.6 today. Patient requesting to be taken off metformin. Continue weekly Trulicity 7.41 mg. Continues to lose weight.  Diet and nutrition discussed. Follow-up in 6 months.         Respiratory   OSA (obstructive sleep apnea) (Chronic)    Stable.  On CPAP treatment.        Endocrine   Hyperlipidemia associated with type 2 diabetes mellitus ([place])    Doing well and making progress.  Losing weight.  Continue atorvastatin 80 mg daily. Diet and nutrition discussed.      Relevant Orders   POCT glycosylated hemoglobin (Hb A1C) (Completed)     Other   Chronic left shoulder pain    Mild musculoskeletal injury.  Making progress and slowly improving. May take acetaminophen for pain.  Avoid NSAIDs.      Other Visit Diagnoses     Coronary artery disease involving native coronary artery of native heart without angina pectoris            Patient Instructions  Stop metformin.  Continue Trulicity. Health Maintenance After Age 69 After age 40, you are at a higher risk for certain long-term diseases and infections as well as injuries from  falls. Falls are a major cause of broken bones and head injuries in people who are older than age 4. Getting regular preventive care can help to keep you healthy and well. Preventive care includes getting regular testing and making lifestyle changes as recommended by your health care provider. Talk with your health care provider about: Which screenings and tests you should have. A screening is a test that checks for a disease when you have no symptoms. A diet and exercise plan that is right for you. What should I know about screenings and tests to prevent falls? Screening and testing are the best ways to find a health problem early. Early diagnosis and treatment give you the best chance of managing medical conditions that are common after age 59. Certain conditions and lifestyle choices may make you more likely to have a fall. Your health care provider may recommend: Regular vision checks. Poor vision and conditions such as cataracts can make you more likely to have a fall. If you wear glasses, make sure to get your prescription updated if your vision changes. Medicine review. Work with your health care provider to regularly review all of the medicines you are taking, including over-the-counter medicines. Ask your health care provider about any side effects that may make you more likely to have a fall. Tell your health care provider if any medicines that you take make you feel dizzy or sleepy. Strength and balance checks. Your health care provider may recommend certain tests to check your strength and balance while standing, walking, or changing positions. Foot health exam. Foot pain and numbness, as well as not wearing proper footwear, can make you more likely to have a fall. Screenings, including: Osteoporosis screening. Osteoporosis is a condition that causes the bones to get weaker and break more easily. Blood pressure screening. Blood pressure changes and medicines to control blood pressure can make  you feel dizzy. Depression screening. You may be more likely to have a fall if you have a fear of falling, feel depressed, or feel unable to do activities that you used to do. Alcohol use screening. Using too much alcohol can affect your balance and may make you more likely to have a fall. Follow these instructions at home: Lifestyle Do not drink alcohol if: Your health care provider tells you not to drink. If you drink alcohol: Limit how much you have to: 0-1 drink a day for women. 0-2 drinks a day for men. Know how much alcohol is in your drink. In the [place] one drink equals one 12 oz bottle of beer (355 mL),  one 5 oz glass of wine (148 mL), or one 1 oz glass of hard liquor (44 mL). Do not use any products that contain nicotine or tobacco. These products include cigarettes, chewing tobacco, and vaping devices, such as e-cigarettes. If you need help quitting, ask your health care provider. Activity  Follow a regular exercise program to stay fit. This will help you maintain your balance. Ask your health care provider what types of exercise are appropriate for you. If you need a cane or walker, use it as recommended by your health care provider. Wear supportive shoes that have nonskid soles. Safety  Remove any tripping hazards, such as rugs, cords, and clutter. Install safety equipment such as grab bars in bathrooms and safety rails on stairs. Keep rooms and walkways well-lit. General instructions Talk with your health care provider about your risks for falling. Tell your health care provider if: You fall. Be sure to tell your health care provider about all falls, even ones that seem minor. You feel dizzy, tiredness (fatigue), or off-balance. Take over-the-counter and prescription medicines only as told by your health care provider. These include supplements. Eat a healthy diet and maintain a healthy weight. A healthy diet includes low-fat dairy products, low-fat (lean) meats, and fiber  from whole grains, beans, and lots of fruits and vegetables. Stay current with your vaccines. Schedule regular health, dental, and eye exams. Summary Having a healthy lifestyle and getting preventive care can help to protect your health and wellness after age 11. Screening and testing are the best way to find a health problem early and help you avoid having a fall. Early diagnosis and treatment give you the best chance for managing medical conditions that are more common for people who are older than age 58. Falls are a major cause of broken bones and head injuries in people who are older than age 39. Take precautions to prevent a fall at home. Work with your health care provider to learn what changes you can make to improve your health and wellness and to prevent falls. This information is not intended to replace advice given to you by your health care provider. Make sure you discuss any questions you have with your health care provider. Document Revised: 02/16/2021 Document Reviewed: 02/16/2021 Elsevier Patient Education  2022 [place], MD [facility]

## 2021-10-28 NOTE — Assessment & Plan Note (Signed)
Clinically euvolemic.  No signs of congestive heart failure. 

## 2021-10-28 NOTE — Patient Instructions (Signed)
Stop metformin.  Continue Trulicity. Health Maintenance After Age 68 After age 69, you are at a higher risk for certain long-term diseases and infections as well as injuries from falls. Falls are a major cause of broken bones and head injuries in people who are older than age 45. Getting regular preventive care can help to keep you healthy and well. Preventive care includes getting regular testing and making lifestyle changes as recommended by your health care provider. Talk with your health care provider about: Which screenings and tests you should have. A screening is a test that checks for a disease when you have no symptoms. A diet and exercise plan that is right for you. What should I know about screenings and tests to prevent falls? Screening and testing are the best ways to find a health problem early. Early diagnosis and treatment give you the best chance of managing medical conditions that are common after age 42. Certain conditions and lifestyle choices may make you more likely to have a fall. Your health care provider may recommend: Regular vision checks. Poor vision and conditions such as cataracts can make you more likely to have a fall. If you wear glasses, make sure to get your prescription updated if your vision changes. Medicine review. Work with your health care provider to regularly review all of the medicines you are taking, including over-the-counter medicines. Ask your health care provider about any side effects that may make you more likely to have a fall. Tell your health care provider if any medicines that you take make you feel dizzy or sleepy. Strength and balance checks. Your health care provider may recommend certain tests to check your strength and balance while standing, walking, or changing positions. Foot health exam. Foot pain and numbness, as well as not wearing proper footwear, can make you more likely to have a fall. Screenings, including: Osteoporosis screening.  Osteoporosis is a condition that causes the bones to get weaker and break more easily. Blood pressure screening. Blood pressure changes and medicines to control blood pressure can make you feel dizzy. Depression screening. You may be more likely to have a fall if you have a fear of falling, feel depressed, or feel unable to do activities that you used to do. Alcohol use screening. Using too much alcohol can affect your balance and may make you more likely to have a fall. Follow these instructions at home: Lifestyle Do not drink alcohol if: Your health care provider tells you not to drink. If you drink alcohol: Limit how much you have to: 0-1 drink a day for women. 0-2 drinks a day for men. Know how much alcohol is in your drink. In the [place] one drink equals one 12 oz bottle of beer (355 mL), one 5 oz glass of wine (148 mL), or one 1 oz glass of hard liquor (44 mL). Do not use any products that contain nicotine or tobacco. These products include cigarettes, chewing tobacco, and vaping devices, such as e-cigarettes. If you need help quitting, ask your health care provider. Activity  Follow a regular exercise program to stay fit. This will help you maintain your balance. Ask your health care provider what types of exercise are appropriate for you. If you need a cane or walker, use it as recommended by your health care provider. Wear supportive shoes that have nonskid soles. Safety  Remove any tripping hazards, such as rugs, cords, and clutter. Install safety equipment such as grab bars in bathrooms and safety rails on  stairs. Keep rooms and walkways well-lit. General instructions Talk with your health care provider about your risks for falling. Tell your health care provider if: You fall. Be sure to tell your health care provider about all falls, even ones that seem minor. You feel dizzy, tiredness (fatigue), or off-balance. Take over-the-counter and prescription medicines only as told by  your health care provider. These include supplements. Eat a healthy diet and maintain a healthy weight. A healthy diet includes low-fat dairy products, low-fat (lean) meats, and fiber from whole grains, beans, and lots of fruits and vegetables. Stay current with your vaccines. Schedule regular health, dental, and eye exams. Summary Having a healthy lifestyle and getting preventive care can help to protect your health and wellness after age 36. Screening and testing are the best way to find a health problem early and help you avoid having a fall. Early diagnosis and treatment give you the best chance for managing medical conditions that are more common for people who are older than age 33. Falls are a major cause of broken bones and head injuries in people who are older than age 58. Take precautions to prevent a fall at home. Work with your health care provider to learn what changes you can make to improve your health and wellness and to prevent falls. This information is not intended to replace advice given to you by your health care provider. Make sure you discuss any questions you have with your health care provider. Document Revised: 02/16/2021 Document Reviewed: 02/16/2021 Elsevier Patient Education  [place].

## 2021-10-28 NOTE — Assessment & Plan Note (Signed)
Well-controlled hypertension.  Continue lisinopril 40 mg, hydrochlorothiazide 25 mg, and metoprolol succinate 50 mg daily. BP Readings from Last 3 Encounters:  10/28/21 128/82  06/17/21 110/84  04/27/21 132/70  Well-controlled diabetes with hemoglobin A1c of 5.6 today. Patient requesting to be taken off metformin. Continue weekly Trulicity 2.19 mg. Continues to lose weight.  Diet and nutrition discussed. Follow-up in 6 months.

## 2021-10-28 NOTE — Assessment & Plan Note (Signed)
Doing well and making progress.  Losing weight.  Continue atorvastatin 80 mg daily. Diet and nutrition discussed.

## 2021-11-23 ENCOUNTER — Other Ambulatory Visit: Payer: Self-pay | Admitting: Emergency Medicine

## 2021-11-23 DIAGNOSIS — Z9109 Other allergy status, other than to drugs and biological substances: Secondary | ICD-10-CM

## 2021-12-11 ENCOUNTER — Ambulatory Visit (INDEPENDENT_AMBULATORY_CARE_PROVIDER_SITE_OTHER): Payer: Medicare Other

## 2021-12-11 DIAGNOSIS — I495 Sick sinus syndrome: Secondary | ICD-10-CM | POA: Diagnosis not present

## 2021-12-14 LAB — CUP PACEART REMOTE DEVICE CHECK
Battery Impedance: 1784 Ohm
Battery Remaining Longevity: 47 mo
Battery Voltage: 2.76 V
Brady Statistic AP VP Percent: 0 %
Brady Statistic AP VS Percent: 0 %
Brady Statistic AS VP Percent: 0 %
Brady Statistic AS VS Percent: 100 %
Date Time Interrogation Session: 20230303233624
Implantable Lead Implant Date: 20091201
Implantable Lead Implant Date: 20091201
Implantable Lead Location: 753859
Implantable Lead Location: 753860
Implantable Lead Model: 5076
Implantable Lead Model: 5076
Implantable Pulse Generator Implant Date: 20091201
Lead Channel Impedance Value: 454 Ohm
Lead Channel Impedance Value: 611 Ohm
Lead Channel Pacing Threshold Amplitude: 0.5 V
Lead Channel Pacing Threshold Amplitude: 0.75 V
Lead Channel Pacing Threshold Pulse Width: 0.4 ms
Lead Channel Pacing Threshold Pulse Width: 0.4 ms
Lead Channel Setting Pacing Amplitude: 2 V
Lead Channel Setting Pacing Amplitude: 2.5 V
Lead Channel Setting Pacing Pulse Width: 0.4 ms
Lead Channel Setting Sensing Sensitivity: 4 mV

## 2021-12-16 ENCOUNTER — Encounter: Payer: Self-pay | Admitting: Internal Medicine

## 2021-12-16 ENCOUNTER — Other Ambulatory Visit: Payer: Self-pay

## 2021-12-16 ENCOUNTER — Ambulatory Visit: Payer: Medicare Other | Admitting: Internal Medicine

## 2021-12-16 ENCOUNTER — Other Ambulatory Visit: Payer: Self-pay | Admitting: Internal Medicine

## 2021-12-16 VITALS — BP 110/72 | HR 60 | Ht 62.0 in | Wt 196.0 lb

## 2021-12-16 DIAGNOSIS — E1169 Type 2 diabetes mellitus with other specified complication: Secondary | ICD-10-CM | POA: Diagnosis not present

## 2021-12-16 DIAGNOSIS — I10 Essential (primary) hypertension: Secondary | ICD-10-CM | POA: Diagnosis not present

## 2021-12-16 DIAGNOSIS — I5032 Chronic diastolic (congestive) heart failure: Secondary | ICD-10-CM | POA: Diagnosis not present

## 2021-12-16 DIAGNOSIS — I25119 Atherosclerotic heart disease of native coronary artery with unspecified angina pectoris: Secondary | ICD-10-CM | POA: Diagnosis not present

## 2021-12-16 DIAGNOSIS — I2510 Atherosclerotic heart disease of native coronary artery without angina pectoris: Secondary | ICD-10-CM | POA: Diagnosis not present

## 2021-12-16 DIAGNOSIS — E785 Hyperlipidemia, unspecified: Secondary | ICD-10-CM | POA: Diagnosis not present

## 2021-12-16 NOTE — Progress Notes (Signed)
Follow-up Outpatient Visit Date: 12/16/2021  Primary Care Provider: Horald Pollen, MD [place] [address]  Chief Complaint: Follow-up coronary artery disease and chronic HFpEF  HPI:  [name] [name] is a 66 y.o. female with history of coronary artery disease status post CABG (07/2016) with postoperative atrial fibrillation, chronic HFpEF, hypertension, hyperlipidemia, HFpEF, sinus node dysfunction status post permanent pacemaker, and sleep apnea on CPAP, who presents for follow-up of coronary artery disease and HFpEF.  I last saw her in 06/2021, at which time she noted vague discomfort in her chest reminiscent of what she felt leading up to her CABG in 2017.  Subsequent myocardial perfusion stress test was low risk without evidence of ischemia or scar.  Today, [name] [name] reports that she is feeling quite well.  She has been walking more and has lost about 10 pounds since September.  She was able to come off metformin because her blood sugars are now better controlled.  She denies chest pain, shortness of breath, palpitations, and lightheadedness.  Mild edema is well controlled with as needed furosemide, which [name] [name] takes on average every other day.  --------------------------------------------------------------------------------------------------  Past Medical History:  Diagnosis Date   Allergy    Anal fissure    Anxiety    Arthritis    Asthma    Cataract 2010    left   Colon polyps    diverticulosis  03-2011/2005   Depression    Diabetes mellitus 2012   T2DM   Diverticulosis    Fatty liver 04/2011   GERD (gastroesophageal reflux disease)    Hyperlipidemia    Hypertension severe    Migraine    Obesity    OSA (obstructive sleep apnea)    C-Pap   Pacemaker Medtronic    Sinoatrial node dysfunction ([place] [facility])    syncope   Past Surgical History:  Procedure Laterality Date   ABDOMINAL HYSTERECTOMY  2000   total   CARDIAC CATHETERIZATION N/A 07/20/2016   Procedure:  Left Heart Cath and Coronary Angiography;  Surgeon: Peter M Martinique, MD;  Location: [facility];  Service: Cardiovascular;  Laterality: N/A;   CATARACT EXTRACTION Left    CORONARY ARTERY BYPASS GRAFT N/A 07/22/2016   Procedure: CORONARY ARTERY BYPASS GRAFTING (CABG) x three , using left internal mammary artery and right leg greater saphenous vein harvested endoscopically;  Surgeon: Ivin Poot, MD;  Location: [facility];  Service: Open Heart Surgery;  Laterality: N/A;   EYE SURGERY Right 2010   MULTIPLE TOOTH EXTRACTIONS     OOPHORECTOMY     PACEMAKER PLACEMENT     T/A right knee     TEE WITHOUT CARDIOVERSION N/A 07/22/2016   Procedure: TRANSESOPHAGEAL ECHOCARDIOGRAM (TEE);  Surgeon: Ivin Poot, MD;  Location: [facility];  Service: Open Heart Surgery;  Laterality: N/A;   TONSILLECTOMY      Current Meds  Medication Sig   Accu-Chek Softclix Lancets lancets USE AS DIRECTED   albuterol (VENTOLIN HFA) 108 (90 Base) MCG/ACT inhaler Inhale 1-2 puffs into the lungs every 6 (six) hours as needed for wheezing or shortness of breath.   atorvastatin (LIPITOR) 80 MG tablet TAKE 1 TABLET BY MOUTH ONCE DAILY AT 6 PM   Blood Glucose Monitoring Suppl (ACCU-CHEK GUIDE) w/Device KIT 1 Device by Does not apply route in the morning, at noon, and at bedtime.   cetirizine (ZYRTEC) 10 MG tablet Take 10 mg by mouth daily.   citalopram (CELEXA) 20 MG tablet TAKE 1 TABLET BY  MOUTH  DAILY   clopidogrel (PLAVIX) 75 MG tablet TAKE 1 TABLET BY MOUTH  DAILY   cyclobenzaprine (FLEXERIL) 10 MG tablet TAKE 1 TABLET BY MOUTH AT  BEDTIME AS NEEDED AND MAY  REPEAT DOSE ONE TIME IF  NEEDED FOR MUSCLE SPASMS  (CAREFUL OF SEDATION)   dicyclomine (BENTYL) 20 MG tablet Take 20 mg by mouth 3 (three) times daily.   Dulaglutide (TRULICITY) 4.16 SA/6.3KZ SOPN Inject 0.75 mg into the skin once a week.   fluticasone (FLONASE) 50 MCG/ACT nasal spray USE 2 SPRAYS IN BOTH  NOSTRILS DAILY   fluticasone (FLOVENT HFA) 110 MCG/ACT inhaler  Inhale 2 puffs into the lungs 2 (two) times daily.   furosemide (LASIX) 20 MG tablet TAKE 1 TABLET BY MOUTH  DAILY AS NEEDED FOR EDEMA,  SWELLING   gabapentin (NEURONTIN) 300 MG capsule TAKE 2 CAPSULES BY MOUTH IN THE MORNING AND 3 CAPSULES  AT BEDTIME   glucose blood test strip Use as instructed   hydrochlorothiazide (HYDRODIURIL) 25 MG tablet TAKE 1 TABLET BY MOUTH  DAILY   hydrocortisone cream 1 % Apply 1 application topically 2 (two) times daily.   lisinopril (ZESTRIL) 40 MG tablet TAKE 1 TABLET BY MOUTH  DAILY   metoprolol succinate (TOPROL-XL) 50 MG 24 hr tablet TAKE 1 TABLET BY MOUTH  TWICE DAILY WITH OR  IMMEDIATELY FOLLOWING A  MEAL   Multiple Vitamin (MULTIVITAMIN) tablet Take 1 tablet by mouth daily.   nystatin cream (MYCOSTATIN) Apply 1 application topically as needed for dry skin.   omeprazole (PRILOSEC) 20 MG capsule Take 1 capsule (20 mg total) by mouth daily.   ondansetron (ZOFRAN-ODT) 8 MG disintegrating tablet DISSOLVE 1 ON TONGUE EVERY 12 HOURS AS NEEDED FOR NAUSEA   polycarbophil (FIBERCON) 625 MG tablet Take by mouth daily.   PRALUENT 75 MG/ML SOAJ INJECT 75MG SUBCUTANEOUSLY EVERY 14 DAYS   traMADol (ULTRAM) 50 MG tablet TAKE 1 TABLET BY MOUTH AT  BEDTIME AS NEEDED   triamcinolone cream (KENALOG) 0.1 % Apply 1 application topically as needed.    Allergies: Aspirin, Adhesive [tape], Penicillins, Red yeast rice, and Sulfa drugs cross reactors  Social History   Tobacco Use   Smoking status: Former    Types: Cigarettes    Quit date: 1995    Years since quitting: 28.2   Smokeless tobacco: Never   Tobacco comments:    Quit 20 yrs ago  Vaping Use   Vaping Use: Never used  Substance Use Topics   Alcohol use: No    Alcohol/week: 0.0 standard drinks   Drug use: No    Family History  Problem Relation Age of Onset   Heart disease Mother    Cancer Mother 108       lung   Breast cancer Mother 57   Heart disease Father    Heart attack Sister    Diabetes Sister         type 2   Atrial fibrillation Sister    Heart attack Brother    Heart attack Brother    Cancer Maternal Grandmother        breast   Breast cancer Maternal Grandmother    Leukemia Paternal Grandmother        neoplast   Aneurysm Paternal Grandfather        abdominal (stomach)   Colon cancer Neg Hx     Review of Systems: A 12-system review of systems was performed and was negative except as noted in the HPI.  --------------------------------------------------------------------------------------------------  Physical Exam: BP 110/72 (BP Location: Left Arm, Patient Position: Sitting, Cuff Size: Large)    Pulse 60    Ht _0  (1.575 m)    Wt 196 lb (88.9 kg)    SpO2 97%    BMI 35.85 kg/m   General:  NAD. Neck: No JVD or HJR. Lungs: Clear to auscultation bilaterally without wheezes or crackles. Heart: Regular rate and rhythm without murmurs, rubs, or gallops. Abdomen: Soft, nontender, nondistended. Extremities: No lower extremity edema.  EKG: Normal sinus rhythm with first-degree AV block (PR interval 230 ms).  Otherwise, no significant abnormality.  PR interval has lengthened slightly since 06/17/2021.  Lab Results  Component Value Date   WBC WILL FOLLOW 09/15/2016   HGB WILL FOLLOW 09/15/2016   HCT WILL FOLLOW 09/15/2016   MCV WILL FOLLOW 09/15/2016   PLT WILL FOLLOW 09/15/2016    Lab Results  Component Value Date   NA 140 10/30/2020   K 4.5 10/30/2020   CL 101 10/30/2020   CO2 24 10/30/2020   BUN 24 10/30/2020   CREATININE 0.95 10/30/2020   GLUCOSE 128 (H) 10/30/2020   ALT 19 10/30/2020    Lab Results  Component Value Date   CHOL 129 10/30/2020   HDL 40 10/30/2020   LDLCALC 45 10/30/2020   LDLDIRECT 54 08/29/2019   TRIG 288 (H) 10/30/2020   CHOLHDL 3.2 10/30/2020    --------------------------------------------------------------------------------------------------  ASSESSMENT AND PLAN: Coronary artery disease: No angina reported.  Continue current  medications for secondary prevention.  We will need to continue monitoring PR interval, which has been gradually lengthening in the setting of continued beta-blocker use.  I will check a CBC today in the setting of long-term clopidogrel use.  Chronic HFpEF: [name] [name] appears euvolemic with stable NYHA class I-II symptoms.  Continue current medications including as needed furosemide.  We will check a CMP today.  Hypertension: Blood pressure well controlled today.  No medication changes at this time.  I will check a CMP today to ensure stable renal function and electrolytes.  Hyperlipidemia associated with type 2 diabetes mellitus: LDL well controlled on last check in 10/2020.  Triglycerides mildly elevated at that time.  [name] [name] is tolerating atorvastatin and alirocumab amount of well.  We will check a lipid panel and CMP today.  Morbid obesity: BMI remains greater than 35 with multiple comorbidities (coronary artery disease, HFpEF, and diabetes mellitus).  I congratulated [name] [name] on her weight loss thus far and encouraged her to continue with this.  Follow-up: Return to clinic in 1 year.  Nelva Bush, MD 12/16/2021 10:36 AM

## 2021-12-16 NOTE — Patient Instructions (Signed)
Medication Instructions:  ? ?Your physician recommends that you continue on your current medications as directed. Please refer to the Current Medication list given to you today. ? ?*If you need a refill on your cardiac medications before your next appointment, please call your pharmacy* ? ? ?Lab Work: ? ?Today: CBC, CMET, Lipid panel ? ?If you have labs (blood work) drawn today and your tests are completely normal, you will receive your results only by: ?MyChart Message (if you have MyChart) OR ?A paper copy in the mail ?If you have any lab test that is abnormal or we need to change your treatment, we will call you to review the results. ? ? ?Testing/Procedures: ? ?None ordered ? ? ?Follow-Up: ?At [facility], you and your health needs are our priority.  As part of our continuing mission to provide you with exceptional heart care, we have created designated Provider Care Teams.  These Care Teams include your primary Cardiologist (physician) and Advanced Practice Providers (APPs -  Physician Assistants and Nurse Practitioners) who all work together to provide you with the care you need, when you need it. ? ?We recommend signing up for the patient portal called "MyChart".  Sign up information is provided on this After Visit Summary.  MyChart is used to connect with patients for Virtual Visits (Telemedicine).  Patients are able to view lab/test results, encounter notes, upcoming appointments, etc.  Non-urgent messages can be sent to your provider as well.   ?To learn more about what you can do with MyChart, go to [email or web address].   ? ?Your next appointment:   ?1 year(s) ? ?The format for your next appointment:   ?In Person ? ?Provider:   ?You may see Nelva Bush, MD or one of the following Advanced Practice Providers on your designated Care Team:   ?Murray Hodgkins, NP ?Christell Faith, PA-C ?Cadence Kathlen Mody, PA-C ?

## 2021-12-17 LAB — COMPREHENSIVE METABOLIC PANEL
ALT: 12 IU/L (ref 0–32)
AST: 19 IU/L (ref 0–40)
Albumin/Globulin Ratio: 1.9 (ref 1.2–2.2)
Albumin: 4.6 g/dL (ref 3.8–4.8)
Alkaline Phosphatase: 100 IU/L (ref 44–121)
BUN/Creatinine Ratio: 32 — ABNORMAL HIGH (ref 12–28)
BUN: 32 mg/dL — ABNORMAL HIGH (ref 8–27)
Bilirubin Total: 0.4 mg/dL (ref 0.0–1.2)
CO2: 22 mmol/L (ref 20–29)
Calcium: 9.7 mg/dL (ref 8.7–10.3)
Chloride: 98 mmol/L (ref 96–106)
Creatinine, Ser: 1 mg/dL (ref 0.57–1.00)
Globulin, Total: 2.4 g/dL (ref 1.5–4.5)
Glucose: 100 mg/dL — ABNORMAL HIGH (ref 70–99)
Potassium: 4.3 mmol/L (ref 3.5–5.2)
Sodium: 138 mmol/L (ref 134–144)
Total Protein: 7 g/dL (ref 6.0–8.5)
eGFR: 62 mL/min/{1.73_m2} (ref >59)

## 2021-12-17 LAB — LIPID PANEL
Chol/HDL Ratio: 3.5 ratio (ref 0.0–4.4)
Cholesterol, Total: 138 mg/dL (ref 100–199)
HDL: 40 mg/dL (ref >39)
LDL Chol Calc (NIH): 60 mg/dL (ref 0–99)
Triglycerides: 236 mg/dL — ABNORMAL HIGH (ref 0–149)
VLDL Cholesterol Cal: 38 mg/dL (ref 5–40)

## 2021-12-17 LAB — CBC
Hematocrit: 35.4 % (ref 34.0–46.6)
Hemoglobin: 12.1 g/dL (ref 11.1–15.9)
MCH: 31.5 pg (ref 26.6–33.0)
MCHC: 34.2 g/dL (ref 31.5–35.7)
MCV: 92 fL (ref 79–97)
Platelets: 219 10*3/uL (ref 150–450)
RBC: 3.84 x10E6/uL (ref 3.77–5.28)
RDW: 12.3 % (ref 11.7–15.4)
WBC: 4.7 10*3/uL (ref 3.4–10.8)

## 2021-12-19 ENCOUNTER — Other Ambulatory Visit: Payer: Self-pay | Admitting: Internal Medicine

## 2021-12-19 ENCOUNTER — Other Ambulatory Visit: Payer: Self-pay | Admitting: Emergency Medicine

## 2021-12-19 DIAGNOSIS — F419 Anxiety disorder, unspecified: Secondary | ICD-10-CM

## 2021-12-21 NOTE — Progress Notes (Signed)
Remote pacemaker transmission.   

## 2021-12-29 ENCOUNTER — Other Ambulatory Visit: Payer: Self-pay | Admitting: Emergency Medicine

## 2021-12-29 DIAGNOSIS — I2510 Atherosclerotic heart disease of native coronary artery without angina pectoris: Secondary | ICD-10-CM

## 2021-12-30 ENCOUNTER — Ambulatory Visit: Payer: Medicare Other | Admitting: Adult Health

## 2021-12-30 ENCOUNTER — Encounter: Payer: Self-pay | Admitting: Adult Health

## 2021-12-30 ENCOUNTER — Other Ambulatory Visit: Payer: Self-pay

## 2021-12-30 VITALS — BP 100/68 | HR 59 | Ht 62.0 in | Wt 196.2 lb

## 2021-12-30 DIAGNOSIS — G4733 Obstructive sleep apnea (adult) (pediatric): Secondary | ICD-10-CM

## 2021-12-30 DIAGNOSIS — Z9989 Dependence on other enabling machines and devices: Secondary | ICD-10-CM | POA: Diagnosis not present

## 2021-12-30 NOTE — Progress Notes (Signed)
? ? ?PATIENT: [name] [name] ?DOB: [birth year] ? ?REASON FOR VISIT: follow up ?HISTORY FROM: patient ?PRIMARY NEUROLOGIST: Dr. Rexene Alberts ? ?HISTORY OF PRESENT ILLNESS: ?Today 12/30/21: ? ?Ms. Acero is a 66 year old female with a history of obstructive sleep apnea on CPAP.  She returns today for follow-up.  She reports that the CPAP is working well.  She denies any new issues.  Returns today for follow-up. ? ? ?REVIEW OF SYSTEMS: Out of a complete 14 system review of symptoms, the patient complains only of the following symptoms, and all other reviewed systems are negative. ? ?ESS 3 ? ? ?ALLERGIES: ?Allergies  ?Allergen Reactions  ? Aspirin Other (See Comments)  ?  Sores in mouth ?Sores in mouth  ? Adhesive [Tape] Other (See Comments)  ?  SKIN BLISTERS  ? Penicillins   ?  Swelling   ? Red Yeast Rice Hives  ? Sulfa Drugs Cross Reactors Hives  ? ? ?HOME MEDICATIONS: ?Outpatient Medications Prior to Visit  ?Medication Sig Dispense Refill  ? Accu-Chek Softclix Lancets lancets USE AS DIRECTED 200 each 3  ? albuterol (VENTOLIN HFA) 108 (90 Base) MCG/ACT inhaler Inhale 1-2 puffs into the lungs every 6 (six) hours as needed for wheezing or shortness of breath. 6.7 g 3  ? atorvastatin (LIPITOR) 80 MG tablet TAKE 1 TABLET BY MOUTH ONCE DAILY AT 6 PM 90 tablet 3  ? Blood Glucose Monitoring Suppl (ACCU-CHEK GUIDE) w/Device KIT 1 Device by Does not apply route in the morning, at noon, and at bedtime. 1 kit 0  ? cetirizine (ZYRTEC) 10 MG tablet Take 10 mg by mouth daily.    ? citalopram (CELEXA) 20 MG tablet TAKE 1 TABLET BY MOUTH  DAILY 90 tablet 3  ? clopidogrel (PLAVIX) 75 MG tablet TAKE 1 TABLET BY MOUTH  DAILY 90 tablet 3  ? cyclobenzaprine (FLEXERIL) 10 MG tablet TAKE 1 TABLET BY MOUTH AT  BEDTIME AS NEEDED AND MAY  REPEAT DOSE ONE TIME IF  NEEDED FOR MUSCLE SPASMS  (CAREFUL OF SEDATION) 30 tablet 1  ? dicyclomine (BENTYL) 20 MG tablet Take 20 mg by mouth 3 (three) times daily.    ? Dulaglutide (TRULICITY) 4.33 IR/5.1OA SOPN Inject  0.75 mg into the skin once a week. 4 pen 3  ? fluticasone (FLONASE) 50 MCG/ACT nasal spray USE 2 SPRAYS IN BOTH  NOSTRILS DAILY 48 g 0  ? fluticasone (FLOVENT HFA) 110 MCG/ACT inhaler Inhale 2 puffs into the lungs 2 (two) times daily. 1 Inhaler 12  ? furosemide (LASIX) 20 MG tablet TAKE 1 TABLET BY MOUTH  DAILY AS NEEDED FOR EDEMA  SWELLING 90 tablet 3  ? gabapentin (NEURONTIN) 300 MG capsule TAKE 2 CAPSULES BY MOUTH IN THE MORNING AND 3 CAPSULES  AT BEDTIME 450 capsule 3  ? glucose blood test strip Use as instructed 200 each 5  ? hydrochlorothiazide (HYDRODIURIL) 25 MG tablet TAKE 1 TABLET BY MOUTH  DAILY 90 tablet 3  ? hydrocortisone cream 1 % Apply 1 application topically 2 (two) times daily.    ? lisinopril (ZESTRIL) 40 MG tablet TAKE 1 TABLET BY MOUTH  DAILY 90 tablet 3  ? metoprolol succinate (TOPROL-XL) 50 MG 24 hr tablet TAKE 1 TABLET BY MOUTH  TWICE DAILY WITH OR  IMMEDIATELY FOLLOWING A  MEAL 180 tablet 3  ? Multiple Vitamin (MULTIVITAMIN) tablet Take 1 tablet by mouth daily.    ? nystatin cream (MYCOSTATIN) Apply 1 application topically as needed for dry skin. 30 g 1  ? omeprazole (PRILOSEC)  20 MG capsule Take 1 capsule (20 mg total) by mouth daily. 90 capsule 1  ? ondansetron (ZOFRAN-ODT) 8 MG disintegrating tablet DISSOLVE 1 ON TONGUE EVERY 12 HOURS AS NEEDED FOR NAUSEA 30 tablet 3  ? polycarbophil (FIBERCON) 625 MG tablet Take by mouth daily.    ? PRALUENT 75 MG/ML SOAJ INJECT 75 MG SUBCUTANEOUSLY EVERY 14 DAYS 2 mL 0  ? traMADol (ULTRAM) 50 MG tablet TAKE 1 TABLET BY MOUTH AT  BEDTIME AS NEEDED 30 tablet 1  ? triamcinolone cream (KENALOG) 0.1 % Apply 1 application topically as needed. 30 g 2  ? ?No facility-administered medications prior to visit.  ? ? ?PAST MEDICAL HISTORY: ?Past Medical History:  ?Diagnosis Date  ? Allergy   ? Anal fissure   ? Anxiety   ? Arthritis   ? Asthma   ? Cataract 2010   ? left  ? Colon polyps   ? diverticulosis  03-2011/2005  ? Depression   ? Diabetes mellitus 2012  ? T2DM   ? Diverticulosis   ? Fatty liver 04/2011  ? GERD (gastroesophageal reflux disease)   ? Hyperlipidemia   ? Hypertension severe   ? Migraine   ? Obesity   ? OSA (obstructive sleep apnea)   ? C-Pap  ? Pacemaker Medtronic   ? Sinoatrial node dysfunction ([facility])   ? syncope  ? ? ?PAST SURGICAL HISTORY: ?Past Surgical History:  ?Procedure Laterality Date  ? ABDOMINAL HYSTERECTOMY  2000  ? total  ? CARDIAC CATHETERIZATION N/A 07/20/2016  ? Procedure: Left Heart Cath and Coronary Angiography;  Surgeon: Peter M Martinique, MD;  Location: [facility];  Service: Cardiovascular;  Laterality: N/A;  ? CATARACT EXTRACTION Left   ? CORONARY ARTERY BYPASS GRAFT N/A 07/22/2016  ? Procedure: CORONARY ARTERY BYPASS GRAFTING (CABG) x three , using left internal mammary artery and right leg greater saphenous vein harvested endoscopically;  Surgeon: Ivin Poot, MD;  Location: [facility];  Service: Open Heart Surgery;  Laterality: N/A;  ? EYE SURGERY Right 2010  ? MULTIPLE TOOTH EXTRACTIONS    ? OOPHORECTOMY    ? PACEMAKER PLACEMENT    ? T/A right knee    ? TEE WITHOUT CARDIOVERSION N/A 07/22/2016  ? Procedure: TRANSESOPHAGEAL ECHOCARDIOGRAM (TEE);  Surgeon: Ivin Poot, MD;  Location: [facility];  Service: Open Heart Surgery;  Laterality: N/A;  ? TONSILLECTOMY    ? ? ?FAMILY HISTORY: ?Family History  ?Problem Relation Age of Onset  ? Heart disease Mother   ? Cancer Mother 87  ?     lung  ? Breast cancer Mother 20  ? Heart disease Father   ? Heart attack Sister   ? Diabetes Sister   ?     type 2  ? Atrial fibrillation Sister   ? Heart attack Brother   ? Heart attack Brother   ? Cancer Maternal Grandmother   ?     breast  ? Breast cancer Maternal Grandmother   ? Leukemia Paternal Grandmother   ?     neoplast  ? Aneurysm Paternal Grandfather   ?     abdominal (stomach)  ? Colon cancer Neg Hx   ? ? ?SOCIAL HISTORY: ?Social History  ? ?Socioeconomic History  ? Marital status: Single  ?  Spouse name: Not on file  ? Number of children: 0  ?  Years of education: Not on file  ? Highest education level: Not on file  ?Occupational History  ? Occupation: chef  ?  Employer: [employer]  ?Tobacco Use  ? Smoking status: Former  ?  Types: Cigarettes  ?  Quit date: 1995  ?  Years since quitting: 28.2  ? Smokeless tobacco: Never  ? Tobacco comments:  ?  Quit 20 yrs ago  ?Vaping Use  ? Vaping Use: Never used  ?Substance and Sexual Activity  ? Alcohol use: No  ?  Alcohol/week: 0.0 standard drinks  ? Drug use: No  ? Sexual activity: Yes  ?  Birth control/protection: Post-menopausal, Other-see comments  ?  Comment: Monogamous   ?Other Topics Concern  ? Not on file  ?Social History Narrative  ? Single. Exercise: No. Education: College.  ? ?Social Determinants of Health  ? ?Financial Resource Strain: Not on file  ?Food Insecurity: Not on file  ?Transportation Needs: Not on file  ?Physical Activity: Not on file  ?Stress: Not on file  ?Social Connections: Not on file  ?Intimate Partner Violence: Not on file  ? ? ? ? ?PHYSICAL EXAM ? ?Vitals:  ? 12/30/21 0930  ?BP: 100/68  ?Pulse: (!) 59  ?Weight: 196 lb 3.2 oz (89 kg)  ?Height: '5\' 2"'  (1.575 m)  ? ?Body mass index is 35.89 kg/m?. ? ?Generalized: Well developed, in no acute distress  ?Chest: Lungs clear to auscultation bilaterally ? ?Neurological examination  ?Mentation: Alert oriented to time, place, history taking. Follows all commands speech and language fluent ?Cranial nerve II-XII: Extraocular movements were full, visual field were full on confrontational test Head turning and shoulder shrug  were normal and symmetric. ?Gait and station: Gait is normal.  ? ? ?DIAGNOSTIC DATA (LABS, IMAGING, TESTING) ?- I reviewed patient records, labs, notes, testing and imaging myself where available. ? ?Lab Results  ?Component Value Date  ? WBC 4.7 12/16/2021  ? HGB 12.1 12/16/2021  ? HCT 35.4 12/16/2021  ? MCV 92 12/16/2021  ? PLT 219 12/16/2021  ? ?   ?Component Value Date/Time  ? NA 138 12/16/2021 1048  ? K 4.3  12/16/2021 1048  ? CL 98 12/16/2021 1048  ? CO2 22 12/16/2021 1048  ? GLUCOSE 100 (H) 12/16/2021 1048  ? GLUCOSE 175 (H) 08/28/2019 1322  ? BUN 32 (H) 12/16/2021 1048  ? CREATININE 1.00 12/16/2021 1048  ? CREATIN

## 2022-01-11 ENCOUNTER — Ambulatory Visit (INDEPENDENT_AMBULATORY_CARE_PROVIDER_SITE_OTHER): Payer: Medicare Other

## 2022-01-11 DIAGNOSIS — Z0000 Encounter for general adult medical examination without abnormal findings: Secondary | ICD-10-CM

## 2022-01-11 NOTE — Progress Notes (Signed)
? ?Subjective:  ? [name] [name] is a 66 y.o. female who presents for Medicare Annual (Subsequent) preventive examination. ? ?I connected with Tye [place] Gurski today by telephone and verified that I am speaking with the correct person using two identifiers. ?Location patient: home ?Location provider: work ?Persons participating in the virtual visit: patient, provider. ?  ?I discussed the limitations, risks, security and privacy concerns of performing an evaluation and management service by telephone and the availability of in person appointments. I also discussed with the patient that there may be a patient responsible charge related to this service. The patient expressed understanding and verbally consented to this telephonic visit.  ?  ?Interactive audio and video telecommunications were attempted between this provider and patient, however failed, due to patient having technical difficulties OR patient did not have access to video capability.  We continued and completed visit with audio only. ? ?  ?Review of Systems    ? ?Cardiac Risk Factors include: advanced age (>35mn, >>1women);diabetes mellitus;dyslipidemia;hypertension ? ?   ?Objective:  ?  ?Today's Vitals  ? ?There is no height or weight on file to calculate BMI. ? ? ?  01/11/2022  ? 10:36 AM 09/01/2020  ? 10:24 AM 07/20/2016  ?  8:00 PM  ?Advanced Directives  ?Does Patient Have a Medical Advance Directive? Yes Yes Yes  ?[place] Power of Attorney Living will  ?Does patient want to make changes to medical advance directive?  No - Patient declined No - Patient declined  ?Copy of HHus[place]ain Chart? No - copy requested Yes - validated most recent copy scanned in chart (See row information) No - copy requested  ? ? ?Current Medications (verified) ?Outpatient Encounter Medications as of 01/11/2022  ?Medication Sig  ? Accu-Chek Softclix Lancets lancets USE AS DIRECTED  ? albuterol  (VENTOLIN HFA) 108 (90 Base) MCG/ACT inhaler Inhale 1-2 puffs into the lungs every 6 (six) hours as needed for wheezing or shortness of breath.  ? atorvastatin (LIPITOR) 80 MG tablet TAKE 1 TABLET BY MOUTH ONCE DAILY AT 6 PM  ? Blood Glucose Monitoring Suppl (ACCU-CHEK GUIDE) w/Device KIT 1 Device by Does not apply route in the morning, at noon, and at bedtime.  ? cetirizine (ZYRTEC) 10 MG tablet Take 10 mg by mouth daily.  ? citalopram (CELEXA) 20 MG tablet TAKE 1 TABLET BY MOUTH  DAILY  ? clopidogrel (PLAVIX) 75 MG tablet TAKE 1 TABLET BY MOUTH  DAILY  ? cyclobenzaprine (FLEXERIL) 10 MG tablet TAKE 1 TABLET BY MOUTH AT  BEDTIME AS NEEDED AND MAY  REPEAT DOSE ONE TIME IF  NEEDED FOR MUSCLE SPASMS  (CAREFUL OF SEDATION)  ? dicyclomine (BENTYL) 20 MG tablet Take 20 mg by mouth 3 (three) times daily.  ? Dulaglutide (TRULICITY) 05.57MDU/2.0URSOPN Inject 0.75 mg into the skin once a week.  ? fluticasone (FLONASE) 50 MCG/ACT nasal spray USE 2 SPRAYS IN BOTH  NOSTRILS DAILY  ? fluticasone (FLOVENT HFA) 110 MCG/ACT inhaler Inhale 2 puffs into the lungs 2 (two) times daily.  ? furosemide (LASIX) 20 MG tablet TAKE 1 TABLET BY MOUTH  DAILY AS NEEDED FOR EDEMA  SWELLING  ? gabapentin (NEURONTIN) 300 MG capsule TAKE 2 CAPSULES BY MOUTH IN THE MORNING AND 3 CAPSULES  AT BEDTIME  ? glucose blood test strip Use as instructed  ? hydrochlorothiazide (HYDRODIURIL) 25 MG tablet TAKE 1 TABLET BY MOUTH  DAILY  ? hydrocortisone cream 1 %  Apply 1 application topically 2 (two) times daily.  ? lisinopril (ZESTRIL) 40 MG tablet TAKE 1 TABLET BY MOUTH  DAILY  ? metoprolol succinate (TOPROL-XL) 50 MG 24 hr tablet TAKE 1 TABLET BY MOUTH  TWICE DAILY WITH OR  IMMEDIATELY FOLLOWING A  MEAL  ? Multiple Vitamin (MULTIVITAMIN) tablet Take 1 tablet by mouth daily.  ? nystatin cream (MYCOSTATIN) Apply 1 application topically as needed for dry skin.  ? omeprazole (PRILOSEC) 20 MG capsule Take 1 capsule (20 mg total) by mouth daily.  ? ondansetron  (ZOFRAN-ODT) 8 MG disintegrating tablet DISSOLVE 1 ON TONGUE EVERY 12 HOURS AS NEEDED FOR NAUSEA  ? polycarbophil (FIBERCON) 625 MG tablet Take by mouth daily.  ? PRALUENT 75 MG/ML SOAJ INJECT 75 MG SUBCUTANEOUSLY EVERY 14 DAYS  ? traMADol (ULTRAM) 50 MG tablet TAKE 1 TABLET BY MOUTH AT  BEDTIME AS NEEDED  ? triamcinolone cream (KENALOG) 0.1 % Apply 1 application topically as needed.  ? ?No facility-administered encounter medications on file as of 01/11/2022.  ? ? ?Allergies (verified) ?Aspirin, Adhesive [tape], Penicillins, Red yeast rice, and Sulfa drugs cross reactors  ? ?History: ?Past Medical History:  ?Diagnosis Date  ? Allergy   ? Anal fissure   ? Anxiety   ? Arthritis   ? Asthma   ? Cataract 2010   ? left  ? Colon polyps   ? diverticulosis  03-2011/2005  ? Depression   ? Diabetes mellitus 2012  ? T2DM  ? Diverticulosis   ? Fatty liver 04/2011  ? GERD (gastroesophageal reflux disease)   ? Hyperlipidemia   ? Hypertension severe   ? Migraine   ? Obesity   ? OSA (obstructive sleep apnea)   ? C-Pap  ? Pacemaker Medtronic   ? Sinoatrial node dysfunction (HCC)   ? syncope  ? ?Past Surgical History:  ?Procedure Laterality Date  ? ABDOMINAL HYSTERECTOMY  2000  ? total  ? CARDIAC CATHETERIZATION N/A 07/20/2016  ? Procedure: Left Heart Cath and Coronary Angiography;  Surgeon: Peter M Jordan, MD;  Location: [facility];  Service: Cardiovascular;  Laterality: N/A;  ? CATARACT EXTRACTION Left   ? CORONARY ARTERY BYPASS GRAFT N/A 07/22/2016  ? Procedure: CORONARY ARTERY BYPASS GRAFTING (CABG) x three , using left internal mammary artery and right leg greater saphenous vein harvested endoscopically;  Surgeon: Peter Van Trigt, MD;  Location: [facility] OR;  Service: Open Heart Surgery;  Laterality: N/A;  ? EYE SURGERY Right 2010  ? MULTIPLE TOOTH EXTRACTIONS    ? OOPHORECTOMY    ? PACEMAKER PLACEMENT    ? T/A right knee    ? TEE WITHOUT CARDIOVERSION N/A 07/22/2016  ? Procedure: TRANSESOPHAGEAL ECHOCARDIOGRAM (TEE);  Surgeon:  Peter Van Trigt, MD;  Location: [facility] OR;  Service: Open Heart Surgery;  Laterality: N/A;  ? TONSILLECTOMY    ? ?Family History  ?Problem Relation Age of Onset  ? Heart disease Mother   ? Cancer Mother 59  ?     lung  ? Breast cancer Mother 57  ? Heart disease Father   ? Heart attack Sister   ? Diabetes Sister   ?     type 2  ? Atrial fibrillation Sister   ? Sleep apnea Sister   ? Heart attack Brother   ? Heart attack Brother   ? Cancer Maternal Grandmother   ?     breast  ? Breast cancer Maternal Grandmother   ? Leukemia Paternal Grandmother   ?       neoplast  ? Aneurysm Paternal Grandfather   ?     abdominal (stomach)  ? Colon cancer Neg Hx   ? ?Social History  ? ?Socioeconomic History  ? Marital status: Single  ?  Spouse name: Not on file  ? Number of children: 0  ? Years of education: Not on file  ? Highest education level: Not on file  ?Occupational History  ? Occupation: chef  ?  Employer: [employer]  ?Tobacco Use  ? Smoking status: Former  ?  Types: Cigarettes  ?  Quit date: 1995  ?  Years since quitting: 28.2  ? Smokeless tobacco: Never  ? Tobacco comments:  ?  Quit 20 yrs ago  ?Vaping Use  ? Vaping Use: Never used  ?Substance and Sexual Activity  ? Alcohol use: No  ?  Alcohol/week: 0.0 standard drinks  ? Drug use: No  ? Sexual activity: Yes  ?  Birth control/protection: Post-menopausal, Other-see comments  ?  Comment: Monogamous   ?Other Topics Concern  ? Not on file  ?Social History Narrative  ? Single. Exercise: No. Education: College.  ? ?Social Determinants of Health  ? ?Financial Resource Strain: Low Risk   ? Difficulty of Paying Living Expenses: Not hard at all  ?Food Insecurity: No Food Insecurity  ? Worried About Running Out of Food in the Last Year: Never true  ? Ran Out of Food in the Last Year: Never true  ?Transportation Needs: No Transportation Needs  ? Lack of Transportation (Medical): No  ? Lack of Transportation (Non-Medical): No  ?Physical Activity: Sufficiently Active  ? Days of  Exercise per Week: 4 days  ? Minutes of Exercise per Session: 40 min  ?Stress: No Stress Concern Present  ? Feeling of Stress : Not at all  ?Social Connections: Socially Isolated  ? Frequency of Communication wit

## 2022-01-11 NOTE — Patient Instructions (Signed)
[name] [name] , ?Thank you for taking time to come for your Medicare Wellness Visit. I appreciate your ongoing commitment to your health goals. Please review the following plan we discussed and let me know if I can assist you in the future.  ? ?Screening recommendations/referrals: ?Colonoscopy: 05/08/2019  dure 04/2022 ?Mammogram: 03/18/2021 ?Bone Density: 03/18/2021 ?Recommended yearly ophthalmology/optometry visit for glaucoma screening and checkup ?Recommended yearly dental visit for hygiene and checkup ? ?Vaccinations: ?Influenza vaccine: completed  ?Pneumococcal vaccine: completed  ?Tdap vaccine: due  ?Shingles vaccine: completed    ? ?Advanced directives: yes  ? ?Conditions/risks identified: none  ? ?Next appointment: none  ? ? ?Preventive Care 68 Years and Older, Female ?Preventive care refers to lifestyle choices and visits with your health care provider that can promote health and wellness. ?What does preventive care include? ?A yearly physical exam. This is also called an annual well check. ?Dental exams once or twice a year. ?Routine eye exams. Ask your health care provider how often you should have your eyes checked. ?Personal lifestyle choices, including: ?Daily care of your teeth and gums. ?Regular physical activity. ?Eating a healthy diet. ?Avoiding tobacco and drug use. ?Limiting alcohol use. ?Practicing safe sex. ?Taking low-dose aspirin every day. ?Taking vitamin and mineral supplements as recommended by your health care provider. ?What happens during an annual well check? ?The services and screenings done by your health care provider during your annual well check will depend on your age, overall health, lifestyle risk factors, and family history of disease. ?Counseling  ?Your health care provider may ask you questions about your: ?Alcohol use. ?Tobacco use. ?Drug use. ?Emotional well-being. ?Home and relationship well-being. ?Sexual activity. ?Eating habits. ?History of falls. ?Memory and ability to  understand (cognition). ?Work and work Statistician. ?Reproductive health. ?Screening  ?You may have the following tests or measurements: ?Height, weight, and BMI. ?Blood pressure. ?Lipid and cholesterol levels. These may be checked every 5 years, or more frequently if you are over 68 years old. ?Skin check. ?Lung cancer screening. You may have this screening every year starting at age 21 if you have a 30-pack-year history of smoking and currently smoke or have quit within the past 15 years. ?Fecal occult blood test (FOBT) of the stool. You may have this test every year starting at age 15. ?Flexible sigmoidoscopy or colonoscopy. You may have a sigmoidoscopy every 5 years or a colonoscopy every 10 years starting at age 74. ?Hepatitis C blood test. ?Hepatitis B blood test. ?Sexually transmitted disease (STD) testing. ?Diabetes screening. This is done by checking your blood sugar (glucose) after you have not eaten for a while (fasting). You may have this done every 1-3 years. ?Bone density scan. This is done to screen for osteoporosis. You may have this done starting at age 70. ?Mammogram. This may be done every 1-2 years. Talk to your health care provider about how often you should have regular mammograms. ?Talk with your health care provider about your test results, treatment options, and if necessary, the need for more tests. ?Vaccines  ?Your health care provider may recommend certain vaccines, such as: ?Influenza vaccine. This is recommended every year. ?Tetanus, diphtheria, and acellular pertussis (Tdap, Td) vaccine. You may need a Td booster every 10 years. ?Zoster vaccine. You may need this after age 57. ?Pneumococcal 13-valent conjugate (PCV13) vaccine. One dose is recommended after age 35. ?Pneumococcal polysaccharide (PPSV23) vaccine. One dose is recommended after age 40. ?Talk to your health care provider about which screenings and vaccines you need  and how often you need them. ?This information is not  intended to replace advice given to you by your health care provider. Make sure you discuss any questions you have with your health care provider. ?Document Released: 10/24/2015 Document Revised: 06/16/2016 Document Reviewed: 07/29/2015 ?Elsevier Interactive Patient Education ? 2017 [employer]. ? ?Fall Prevention in the Home ?Falls can cause injuries. They can happen to people of all ages. There are many things you can do to make your home safe and to help prevent falls. ?What can I do on the outside of my home? ?Regularly fix the edges of walkways and driveways and fix any cracks. ?Remove anything that might make you trip as you walk through a door, such as a raised step or threshold. ?Trim any bushes or trees on the path to your home. ?Use bright outdoor lighting. ?Clear any walking paths of anything that might make someone trip, such as rocks or tools. ?Regularly check to see if handrails are loose or broken. Make sure that both sides of any steps have handrails. ?Any raised decks and porches should have guardrails on the edges. ?Have any leaves, snow, or ice cleared regularly. ?Use sand or salt on walking paths during winter. ?Clean up any spills in your garage right away. This includes oil or grease spills. ?What can I do in the bathroom? ?Use night lights. ?Install grab bars by the toilet and in the tub and shower. Do not use towel bars as grab bars. ?Use non-skid mats or decals in the tub or shower. ?If you need to sit down in the shower, use a plastic, non-slip stool. ?Keep the floor dry. Clean up any water that spills on the floor as soon as it happens. ?Remove soap buildup in the tub or shower regularly. ?Attach bath mats securely with double-sided non-slip rug tape. ?Do not have throw rugs and other things on the floor that can make you trip. ?What can I do in the bedroom? ?Use night lights. ?Make sure that you have a light by your bed that is easy to reach. ?Do not use any sheets or blankets that are  too big for your bed. They should not hang down onto the floor. ?Have a firm chair that has side arms. You can use this for support while you get dressed. ?Do not have throw rugs and other things on the floor that can make you trip. ?What can I do in the kitchen? ?Clean up any spills right away. ?Avoid walking on wet floors. ?Keep items that you use a lot in easy-to-reach places. ?If you need to reach something above you, use a strong step stool that has a grab bar. ?Keep electrical cords out of the way. ?Do not use floor polish or wax that makes floors slippery. If you must use wax, use non-skid floor wax. ?Do not have throw rugs and other things on the floor that can make you trip. ?What can I do with my stairs? ?Do not leave any items on the stairs. ?Make sure that there are handrails on both sides of the stairs and use them. Fix handrails that are broken or loose. Make sure that handrails are as long as the stairways. ?Check any carpeting to make sure that it is firmly attached to the stairs. Fix any carpet that is loose or worn. ?Avoid having throw rugs at the top or bottom of the stairs. If you do have throw rugs, attach them to the floor with carpet tape. ?Make sure that you  have a light switch at the top of the stairs and the bottom of the stairs. If you do not have them, ask someone to add them for you. ?What else can I do to help prevent falls? ?Wear shoes that: ?Do not have high heels. ?Have rubber bottoms. ?Are comfortable and fit you well. ?Are closed at the toe. Do not wear sandals. ?If you use a stepladder: ?Make sure that it is fully opened. Do not climb a closed stepladder. ?Make sure that both sides of the stepladder are locked into place. ?Ask someone to hold it for you, if possible. ?Clearly mark and make sure that you can see: ?Any grab bars or handrails. ?First and last steps. ?Where the edge of each step is. ?Use tools that help you move around (mobility aids) if they are needed. These  include: ?Canes. ?Walkers. ?Scooters. ?Crutches. ?Turn on the lights when you go into a dark area. Replace any light bulbs as soon as they burn out. ?Set up your furniture so you have a clear path. Avoid moving your f

## 2022-01-20 ENCOUNTER — Other Ambulatory Visit: Payer: Self-pay | Admitting: Internal Medicine

## 2022-01-23 ENCOUNTER — Other Ambulatory Visit: Payer: Self-pay | Admitting: Emergency Medicine

## 2022-01-23 DIAGNOSIS — Z9109 Other allergy status, other than to drugs and biological substances: Secondary | ICD-10-CM

## 2022-02-24 DIAGNOSIS — G4733 Obstructive sleep apnea (adult) (pediatric): Secondary | ICD-10-CM | POA: Diagnosis not present

## 2022-03-07 ENCOUNTER — Other Ambulatory Visit: Payer: Self-pay | Admitting: Emergency Medicine

## 2022-03-07 DIAGNOSIS — G629 Polyneuropathy, unspecified: Secondary | ICD-10-CM

## 2022-03-12 ENCOUNTER — Ambulatory Visit (INDEPENDENT_AMBULATORY_CARE_PROVIDER_SITE_OTHER): Payer: Medicare Other

## 2022-03-12 DIAGNOSIS — I495 Sick sinus syndrome: Secondary | ICD-10-CM

## 2022-03-14 LAB — CUP PACEART REMOTE DEVICE CHECK
Battery Impedance: 1782 Ohm
Battery Remaining Longevity: 47 mo
Battery Voltage: 2.76 V
Brady Statistic AP VP Percent: 0 %
Brady Statistic AP VS Percent: 0 %
Brady Statistic AS VP Percent: 0 %
Brady Statistic AS VS Percent: 100 %
Date Time Interrogation Session: 20230601085159
Implantable Lead Implant Date: 20091201
Implantable Lead Implant Date: 20091201
Implantable Lead Location: 753859
Implantable Lead Location: 753860
Implantable Lead Model: 5076
Implantable Lead Model: 5076
Implantable Pulse Generator Implant Date: 20091201
Lead Channel Impedance Value: 436 Ohm
Lead Channel Impedance Value: 624 Ohm
Lead Channel Pacing Threshold Amplitude: 0.5 V
Lead Channel Pacing Threshold Amplitude: 0.875 V
Lead Channel Pacing Threshold Pulse Width: 0.4 ms
Lead Channel Pacing Threshold Pulse Width: 0.4 ms
Lead Channel Setting Pacing Amplitude: 2 V
Lead Channel Setting Pacing Amplitude: 2.5 V
Lead Channel Setting Pacing Pulse Width: 0.4 ms
Lead Channel Setting Sensing Sensitivity: 5.6 mV

## 2022-03-19 NOTE — Progress Notes (Signed)
Remote pacemaker transmission.   

## 2022-03-29 ENCOUNTER — Other Ambulatory Visit: Payer: Self-pay | Admitting: Family

## 2022-03-29 NOTE — Telephone Encounter (Signed)
Pt of Dr. End. Please review for refill. Thank you! 

## 2022-04-20 ENCOUNTER — Other Ambulatory Visit: Payer: Self-pay | Admitting: Emergency Medicine

## 2022-04-20 DIAGNOSIS — Z1231 Encounter for screening mammogram for malignant neoplasm of breast: Secondary | ICD-10-CM

## 2022-04-21 ENCOUNTER — Ambulatory Visit (INDEPENDENT_AMBULATORY_CARE_PROVIDER_SITE_OTHER): Payer: Medicare Other

## 2022-04-21 DIAGNOSIS — Z1231 Encounter for screening mammogram for malignant neoplasm of breast: Secondary | ICD-10-CM

## 2022-04-21 DIAGNOSIS — H524 Presbyopia: Secondary | ICD-10-CM | POA: Diagnosis not present

## 2022-04-21 DIAGNOSIS — E119 Type 2 diabetes mellitus without complications: Secondary | ICD-10-CM | POA: Diagnosis not present

## 2022-04-21 DIAGNOSIS — D23112 Other benign neoplasm of skin of right lower eyelid, including canthus: Secondary | ICD-10-CM | POA: Diagnosis not present

## 2022-04-21 DIAGNOSIS — H26492 Other secondary cataract, left eye: Secondary | ICD-10-CM | POA: Diagnosis not present

## 2022-04-23 ENCOUNTER — Telehealth: Payer: Self-pay | Admitting: Emergency Medicine

## 2022-04-23 NOTE — Telephone Encounter (Signed)
Patient assistance form for Trulicity has been received by [facility]. Form is completed an awaiting provider signature. Once signed form will be faxed to fax # provided.

## 2022-04-23 NOTE — Telephone Encounter (Signed)
PT visits today with forms to be filled out and faxed. Forms have been left in Dr.Sagardia's mail box inside of a yellow envelope.  CB if needed: [phone]

## 2022-04-29 ENCOUNTER — Other Ambulatory Visit: Payer: Self-pay | Admitting: Emergency Medicine

## 2022-04-29 ENCOUNTER — Encounter: Payer: Self-pay | Admitting: Emergency Medicine

## 2022-04-29 ENCOUNTER — Ambulatory Visit (INDEPENDENT_AMBULATORY_CARE_PROVIDER_SITE_OTHER): Payer: Medicare Other | Admitting: Emergency Medicine

## 2022-04-29 VITALS — BP 110/68 | HR 54 | Temp 98.3°F | Ht 62.0 in | Wt 197.5 lb

## 2022-04-29 DIAGNOSIS — I5032 Chronic diastolic (congestive) heart failure: Secondary | ICD-10-CM | POA: Diagnosis not present

## 2022-04-29 DIAGNOSIS — K219 Gastro-esophageal reflux disease without esophagitis: Secondary | ICD-10-CM

## 2022-04-29 DIAGNOSIS — G629 Polyneuropathy, unspecified: Secondary | ICD-10-CM | POA: Diagnosis not present

## 2022-04-29 DIAGNOSIS — I25119 Atherosclerotic heart disease of native coronary artery with unspecified angina pectoris: Secondary | ICD-10-CM | POA: Diagnosis not present

## 2022-04-29 DIAGNOSIS — L500 Allergic urticaria: Secondary | ICD-10-CM

## 2022-04-29 DIAGNOSIS — Z9109 Other allergy status, other than to drugs and biological substances: Secondary | ICD-10-CM

## 2022-04-29 DIAGNOSIS — E1142 Type 2 diabetes mellitus with diabetic polyneuropathy: Secondary | ICD-10-CM

## 2022-04-29 DIAGNOSIS — E785 Hyperlipidemia, unspecified: Secondary | ICD-10-CM | POA: Diagnosis not present

## 2022-04-29 DIAGNOSIS — G43109 Migraine with aura, not intractable, without status migrainosus: Secondary | ICD-10-CM

## 2022-04-29 DIAGNOSIS — E1169 Type 2 diabetes mellitus with other specified complication: Secondary | ICD-10-CM

## 2022-04-29 DIAGNOSIS — I152 Hypertension secondary to endocrine disorders: Secondary | ICD-10-CM

## 2022-04-29 DIAGNOSIS — E1159 Type 2 diabetes mellitus with other circulatory complications: Secondary | ICD-10-CM

## 2022-04-29 LAB — CBC WITH DIFFERENTIAL/PLATELET
Basophils Absolute: 0.1 10*3/uL (ref 0.0–0.1)
Basophils Relative: 1 % (ref 0.0–3.0)
Eosinophils Absolute: 0.1 10*3/uL (ref 0.0–0.7)
Eosinophils Relative: 2.8 % (ref 0.0–5.0)
HCT: 37 % (ref 36.0–46.0)
Hemoglobin: 12.7 g/dL (ref 12.0–15.0)
Lymphocytes Relative: 27.2 % (ref 12.0–46.0)
Lymphs Abs: 1.4 10*3/uL (ref 0.7–4.0)
MCHC: 34.4 g/dL (ref 30.0–36.0)
MCV: 91.9 fl (ref 78.0–100.0)
Monocytes Absolute: 0.5 10*3/uL (ref 0.1–1.0)
Monocytes Relative: 9.1 % (ref 3.0–12.0)
Neutro Abs: 3.1 10*3/uL (ref 1.4–7.7)
Neutrophils Relative %: 59.9 % (ref 43.0–77.0)
Platelets: 232 10*3/uL (ref 150.0–400.0)
RBC: 4.03 Mil/uL (ref 3.87–5.11)
RDW: 12.8 % (ref 11.5–15.5)
WBC: 5.2 10*3/uL (ref 4.0–10.5)

## 2022-04-29 LAB — COMPREHENSIVE METABOLIC PANEL
ALT: 14 U/L (ref 0–35)
AST: 20 U/L (ref 0–37)
Albumin: 4.8 g/dL (ref 3.5–5.2)
Alkaline Phosphatase: 88 U/L (ref 39–117)
BUN: 16 mg/dL (ref 6–23)
CO2: 32 mEq/L (ref 19–32)
Calcium: 10.1 mg/dL (ref 8.4–10.5)
Chloride: 96 mEq/L (ref 96–112)
Creatinine, Ser: 1.15 mg/dL (ref 0.40–1.20)
GFR: 49.1 mL/min — ABNORMAL LOW (ref >60.00)
Glucose, Bld: 113 mg/dL — ABNORMAL HIGH (ref 70–99)
Potassium: 5 mEq/L (ref 3.5–5.1)
Sodium: 136 mEq/L (ref 135–145)
Total Bilirubin: 0.5 mg/dL (ref 0.2–1.2)
Total Protein: 7.6 g/dL (ref 6.0–8.3)

## 2022-04-29 LAB — LIPID PANEL
Cholesterol: 143 mg/dL (ref 0–200)
HDL: 46.9 mg/dL (ref >39.00)
NonHDL: 96.42
Total CHOL/HDL Ratio: 3
Triglycerides: 313 mg/dL — ABNORMAL HIGH (ref 0.0–149.0)
VLDL: 62.6 mg/dL — ABNORMAL HIGH (ref 0.0–40.0)

## 2022-04-29 LAB — MICROALBUMIN / CREATININE URINE RATIO
Creatinine,U: 137.7 mg/dL
Microalb Creat Ratio: 1.8 mg/g (ref 0.0–30.0)
Microalb, Ur: 2.4 mg/dL — ABNORMAL HIGH (ref 0.0–1.9)

## 2022-04-29 LAB — HEMOGLOBIN A1C: Hgb A1c MFr Bld: 5.9 % (ref 4.6–6.5)

## 2022-04-29 LAB — LDL CHOLESTEROL, DIRECT: Direct LDL: 57 mg/dL

## 2022-04-29 MED ORDER — ONDANSETRON 8 MG PO TBDP: ORAL_TABLET | ORAL | 3 refills | Status: DC

## 2022-04-29 MED ORDER — GLUCOSE BLOOD VI STRP: ORAL_STRIP | 5 refills | Status: DC

## 2022-04-29 MED ORDER — FLUTICASONE PROPIONATE 50 MCG/ACT NA SUSP: NASAL | 0 refills | Status: DC

## 2022-04-29 MED ORDER — TRIAMCINOLONE ACETONIDE 0.1 % EX CREA
1.0000 | TOPICAL_CREAM | CUTANEOUS | 2 refills | Status: DC | PRN
Start: 2022-04-29 — End: 2022-05-07

## 2022-04-29 MED ORDER — TRAMADOL HCL 50 MG PO TABS: 50.0000 mg | ORAL_TABLET | Freq: Every evening | ORAL | 1 refills | Status: DC | PRN

## 2022-04-29 NOTE — Assessment & Plan Note (Addendum)
Well-controlled hypertension. BP Readings from Last 3 Encounters:  04/29/22 110/68  12/30/21 100/68  12/16/21 110/72  Continue lisinopril 40 mg and hydrochlorothiazide 25 mg. Well-controlled diabetes Lab Results  Component Value Date   HGBA1C 5.6 10/28/2021  Continue weekly Trulicity 2.63 mg

## 2022-04-29 NOTE — Patient Instructions (Signed)
Health Maintenance After Age 68 After age 68, you are at a higher risk for certain long-term diseases and infections as well as injuries from falls. Falls are a major cause of broken bones and head injuries in people who are older than age 68. Getting regular preventive care can help to keep you healthy and well. Preventive care includes getting regular testing and making lifestyle changes as recommended by your health care provider. Talk with your health care provider about: Which screenings and tests you should have. A screening is a test that checks for a disease when you have no symptoms. A diet and exercise plan that is right for you. What should I know about screenings and tests to prevent falls? Screening and testing are the best ways to find a health problem early. Early diagnosis and treatment give you the best chance of managing medical conditions that are common after age 68. Certain conditions and lifestyle choices may make you more likely to have a fall. Your health care provider may recommend: Regular vision checks. Poor vision and conditions such as cataracts can make you more likely to have a fall. If you wear glasses, make sure to get your prescription updated if your vision changes. Medicine review. Work with your health care provider to regularly review all of the medicines you are taking, including over-the-counter medicines. Ask your health care provider about any side effects that may make you more likely to have a fall. Tell your health care provider if any medicines that you take make you feel dizzy or sleepy. Strength and balance checks. Your health care provider may recommend certain tests to check your strength and balance while standing, walking, or changing positions. Foot health exam. Foot pain and numbness, as well as not wearing proper footwear, can make you more likely to have a fall. Screenings, including: Osteoporosis screening. Osteoporosis is a condition that causes  the bones to get weaker and break more easily. Blood pressure screening. Blood pressure changes and medicines to control blood pressure can make you feel dizzy. Depression screening. You may be more likely to have a fall if you have a fear of falling, feel depressed, or feel unable to do activities that you used to do. Alcohol use screening. Using too much alcohol can affect your balance and may make you more likely to have a fall. Follow these instructions at home: Lifestyle Do not drink alcohol if: Your health care provider tells you not to drink. If you drink alcohol: Limit how much you have to: 0-1 drink a day for women. 0-2 drinks a day for men. Know how much alcohol is in your drink. In the [place] one drink equals one 12 oz bottle of beer (355 mL), one 5 oz glass of wine (148 mL), or one 1 oz glass of hard liquor (44 mL). Do not use any products that contain nicotine or tobacco. These products include cigarettes, chewing tobacco, and vaping devices, such as e-cigarettes. If you need help quitting, ask your health care provider. Activity  Follow a regular exercise program to stay fit. This will help you maintain your balance. Ask your health care provider what types of exercise are appropriate for you. If you need a cane or walker, use it as recommended by your health care provider. Wear supportive shoes that have nonskid soles. Safety  Remove any tripping hazards, such as rugs, cords, and clutter. Install safety equipment such as grab bars in bathrooms and safety rails on stairs. Keep rooms and walkways   well-lit. General instructions Talk with your health care provider about your risks for falling. Tell your health care provider if: You fall. Be sure to tell your health care provider about all falls, even ones that seem minor. You feel dizzy, tiredness (fatigue), or off-balance. Take over-the-counter and prescription medicines only as told by your health care provider. These include  supplements. Eat a healthy diet and maintain a healthy weight. A healthy diet includes low-fat dairy products, low-fat (lean) meats, and fiber from whole grains, beans, and lots of fruits and vegetables. Stay current with your vaccines. Schedule regular health, dental, and eye exams. Summary Having a healthy lifestyle and getting preventive care can help to protect your health and wellness after age 68. Screening and testing are the best way to find a health problem early and help you avoid having a fall. Early diagnosis and treatment give you the best chance for managing medical conditions that are more common for people who are older than age 68. Falls are a major cause of broken bones and head injuries in people who are older than age 68. Take precautions to prevent a fall at home. Work with your health care provider to learn what changes you can make to improve your health and wellness and to prevent falls. This information is not intended to replace advice given to you by your health care provider. Make sure you discuss any questions you have with your health care provider. Document Revised: 02/16/2021 Document Reviewed: 02/16/2021 Elsevier Patient Education  2023 Elsevier Inc.  

## 2022-04-29 NOTE — Assessment & Plan Note (Addendum)
Clinically stable.  No recent anginal episodes. No need for nitroglycerin use. Continue Plavix 75 mg daily Continue metoprolol succinate 50 mg daily.

## 2022-04-29 NOTE — Assessment & Plan Note (Signed)
Stable.  Diet and nutrition discussed. Wt Readings from Last 3 Encounters:  04/29/22 197 lb 8 oz (89.6 kg)  12/30/21 196 lb 3.2 oz (89 kg)  12/16/21 196 lb (88.9 kg)  Continue atorvastatin 80 mg daily.

## 2022-04-29 NOTE — Assessment & Plan Note (Signed)
Stable. Asymptomatic.

## 2022-04-29 NOTE — Progress Notes (Signed)
[name] [name] 66 y.o.   Chief Complaint  Patient presents with   Follow-up    26mth f/u appt     HISTORY OF PRESENT ILLNESS: This is a 66y.o. female here for 65-monthollow-up of chronic medical problems. Doing well.  Has no complaints or medical concerns today. BP Readings from Last 3 Encounters:  04/29/22 110/68  12/30/21 100/68  12/16/21 110/72   Wt Readings from Last 3 Encounters:  04/29/22 197 lb 8 oz (89.6 kg)  12/30/21 196 lb 3.2 oz (89 kg)  12/16/21 196 lb (88.9 kg)   Lab Results  Component Value Date   HGBA1C 5.6 10/28/2021     HPI   Prior to Admission medications   Medication Sig Start Date End Date Taking? Authorizing Provider  Accu-Chek Softclix Lancets lancets USE AS DIRECTED 05/10/21  Yes Delrick Dehart, MiInes BloomerMD  atorvastatin (LIPITOR) 80 MG tablet TAKE 1 TABLET BY MOUTH ONCE DAILY AT 6 PM 12/29/21  Yes Seanpaul Preece, MiInes BloomerMD  Blood Glucose Monitoring Suppl (ACCU-CHEK GUIDE) w/Device KIT 1 Device by Does not apply route in the morning, at noon, and at bedtime. 02/26/20  Yes Lesean Woolverton, MiInes BloomerMD  cetirizine (ZYRTEC) 10 MG tablet Take 10 mg by mouth daily.   Yes [provider]  citalopram (CELEXA) 20 MG tablet TAKE 1 TABLET BY MOUTH  DAILY 12/20/21  Yes Tonantzin Mimnaugh, MiInes BloomerMD  clopidogrel (PLAVIX) 75 MG tablet TAKE 1 TABLET BY MOUTH  DAILY 03/30/22  Yes End, ChHarrell GaveMD  cyclobenzaprine (FLEXERIL) 10 MG tablet TAKE 1 TABLET BY MOUTH AT  BEDTIME AS NEEDED AND MAY  REPEAT DOSE ONE TIME IF  NEEDED FOR MUSCLE SPASMS  (CAREFUL OF SEDATION) 02/14/20  Yes Yasmine Kilbourne, MiInes BloomerMD  dicyclomine (BENTYL) 20 MG tablet Take 20 mg by mouth 3 (three) times daily. 07/11/21  Yes [provider]  Dulaglutide (TRULICITY) 0.5.42GHC/6.2BJOPN Inject 0.75 mg into the skin once a week. 10/30/19  Yes Kajol Crispen, MiInes BloomerMD  fluticasone ([facility] MCG/ACT nasal spray USE 2 SPRAYS IN BOTH NOSTRILS  DAILY 01/23/22  Yes Vivianne Carles, MiInes BloomerMD   furosemide (LASIX) 20 MG tablet TAKE 1 TABLET BY MOUTH  DAILY AS NEEDED FOR EDEMA  SWELLING 12/21/21  Yes End, ChHarrell GaveMD  gabapentin (NEURONTIN) 300 MG capsule TAKE 2 CAPSULES BY MOUTH IN THE MORNING AND 3 CAPSULES  AT BEDTIME 04/29/22  Yes Serene Kopf, MiInes BloomerMD  glucose blood test strip Use as instructed 10/29/19  Yes Lura Falor, MiInes BloomerMD  hydrochlorothiazide (HYDRODIURIL) 25 MG tablet TAKE 1 TABLET BY MOUTH  DAILY 12/21/21  Yes End, ChHarrell GaveMD  hydrocortisone cream 1 % Apply 1 application topically 2 (two) times daily.   Yes [provider]  lisinopril (ZESTRIL) 40 MG tablet TAKE 1 TABLET BY MOUTH  DAILY 12/29/21  Yes Keyanah Kozicki, MiInes BloomerMD  metoprolol succinate (TOPROL-XL) 50 MG 24 hr tablet TAKE 1 TABLET BY MOUTH  TWICE DAILY WITH OR  IMMEDIATELY FOLLOWING A  MEAL 03/29/21  Yes Floy Riegler, MiInes BloomerMD  Multiple Vitamin (MULTIVITAMIN) tablet Take 1 tablet by mouth daily. 08/30/16  Yes ZiLars Pinks, PA-C  nystatin cream (MYCOSTATIN) Apply 1 application topically as needed for dry skin. 04/28/20  Yes Reyann Troop, MiInes BloomerMD  omeprazole (PRILOSEC) 20 MG capsule Take 1 capsule (20 mg total) by mouth daily. 12/15/16  Yes English, Stephanie D, PA  ondansetron (ZOFRAN-ODT) 8 MG disintegrating tablet DISSOLVE 1 ON TONGUE EVERY 12 HOURS AS NEEDED FOR NAUSEA  10/28/21  Yes Doryce Mcgregory, Ines Bloomer, MD  polycarbophil (FIBERCON) 625 MG tablet Take by mouth daily.   Yes [provider]  PRALUENT 75 MG/ML SOAJ INJECT 75 MG SUBCUTANEOUSLY EVERY 14 DAYS 01/21/22  Yes End, Harrell Gave, MD  traMADol (ULTRAM) 50 MG tablet TAKE 1 TABLET BY MOUTH AT  BEDTIME AS NEEDED 08/03/21  Yes Tenecia Ignasiak, Ines Bloomer, MD  triamcinolone cream (KENALOG) 0.1 % Apply 1 application topically as needed. 04/17/19  Yes Latishia Suitt, Ines Bloomer, MD  albuterol (VENTOLIN HFA) 108 (90 Base) MCG/ACT inhaler Inhale 1-2 puffs into the lungs every 6 (six) hours as needed for wheezing or shortness of  breath. Patient not taking: Reported on 04/29/2022 04/17/19   Horald Pollen, MD  fluticasone [facility]) 110 MCG/ACT inhaler Inhale 2 puffs into the lungs 2 (two) times daily. Patient not taking: Reported on 04/29/2022 04/17/19   Horald Pollen, MD    Allergies  Allergen Reactions   Aspirin Other (See Comments)    Sores in mouth Sores in mouth   Adhesive [Tape] Other (See Comments)    SKIN BLISTERS   Penicillins     Swelling    Red Yeast Rice Hives   Sulfa Drugs Cross Reactors Hives    Patient Active Problem List   Diagnosis Date Noted   Chronic left shoulder pain 10/28/2021   Hyperlipidemia associated with type 2 diabetes mellitus ([place]) 12/10/2020   Trigger finger, right middle finger 11/07/2019   Hypertension associated with diabetes ([place]) 10/29/2019   Chronic heart failure with preserved ejection fraction (HFpEF) ([place]) 05/25/2019   Coronary artery disease involving native coronary artery of native heart with angina pectoris ([name]) 02/14/2018   Morbid obesity ([place]) 02/14/2018   Irritable bowel syndrome 06/28/2017   S/P CABG x 3 07/22/2016   Multiple vessel coronary artery disease 07/21/2016   STEMI involving left circumflex coronary artery ([place]) 07/20/2016   Environmental allergies 04/26/2016   Sinus node dysfunction ([place]) 04/26/2016   Pacemaker Medtronic    OSA (obstructive sleep apnea) 05/25/2011   Hx of adenomatous colonic polyps 05/25/2011   Migraine 05/25/2011   Diabetes mellitus type 2, uncontrolled, with complications 89/21/1941   GERD (gastroesophageal reflux disease) 05/25/2011   NAFLD (nonalcoholic fatty liver disease) 05/25/2011   Hyperlipidemia LDL goal <70 05/25/2011   Essential hypertension 74/05/1447   DIASTOLIC HEART FAILURE, CHRONIC 09/16/2009    Past Medical History:  Diagnosis Date   Allergy    Anal fissure    Anxiety    Arthritis    Asthma    Cataract 2010    left   Colon polyps    diverticulosis  03-2011/2005   Depression     Diabetes mellitus 2012   T2DM   Diverticulosis    Fatty liver 04/2011   GERD (gastroesophageal reflux disease)    Hyperlipidemia    Hypertension severe    Migraine    Obesity    OSA (obstructive sleep apnea)    C-Pap   Pacemaker Medtronic    Sinoatrial node dysfunction ([place])    syncope    Past Surgical History:  Procedure Laterality Date   ABDOMINAL HYSTERECTOMY  2000   total   CARDIAC CATHETERIZATION N/A 07/20/2016   Procedure: Left Heart Cath and Coronary Angiography;  Surgeon: Peter M Martinique, MD;  Location: [facility];  Service: Cardiovascular;  Laterality: N/A;   CATARACT EXTRACTION Left    CORONARY ARTERY BYPASS GRAFT N/A 07/22/2016   Procedure: CORONARY ARTERY BYPASS GRAFTING (CABG) x three , using left  internal mammary artery and right leg greater saphenous vein harvested endoscopically;  Surgeon: Ivin Poot, MD;  Location: [place];  Service: Open Heart Surgery;  Laterality: N/A;   EYE SURGERY Right 2010   MULTIPLE TOOTH EXTRACTIONS     OOPHORECTOMY     PACEMAKER PLACEMENT     T/A right knee     TEE WITHOUT CARDIOVERSION N/A 07/22/2016   Procedure: TRANSESOPHAGEAL ECHOCARDIOGRAM (TEE);  Surgeon: Ivin Poot, MD;  Location: [facility];  Service: Open Heart Surgery;  Laterality: N/A;   TONSILLECTOMY      Social History   Socioeconomic History   Marital status: Single    Spouse name: Not on file   Number of children: 0   Years of education: Not on file   Highest education level: Not on file  Occupational History   Occupation: chef    Employer: [employer]  Tobacco Use   Smoking status: Former    Types: Cigarettes    Quit date: 1995    Years since quitting: 28.5   Smokeless tobacco: Never   Tobacco comments:    Quit 20 yrs ago  Vaping Use   Vaping Use: Never used  Substance and Sexual Activity   Alcohol use: No    Alcohol/week: 0.0 standard drinks of alcohol   Drug use: No   Sexual activity: Yes    Birth control/protection:  Post-menopausal, Other-see comments    Comment: Monogamous   Other Topics Concern   Not on file  Social History Narrative   Single. Exercise: No. Education: College.   Social Determinants of Health   Financial Resource Strain: Low Risk  (01/11/2022)   Overall Financial Resource Strain (CARDIA)    Difficulty of Paying Living Expenses: Not hard at all  Food Insecurity: No Food Insecurity (01/11/2022)   Hunger Vital Sign    Worried About Running Out of Food in the Last Year: Never true    Ran Out of Food in the Last Year: Never true  Transportation Needs: No Transportation Needs (01/11/2022)   PRAPARE - Hydrologist (Medical): No    Lack of Transportation (Non-Medical): No  Physical Activity: Sufficiently Active (01/11/2022)   Exercise Vital Sign    Days of Exercise per Week: 4 days    Minutes of Exercise per Session: 40 min  Stress: No Stress Concern Present (01/11/2022)   [place]    Feeling of Stress : Not at all  Social Connections: Socially Isolated (01/11/2022)   Social Connection and Isolation Panel [NHANES]    Frequency of Communication with Friends and Family: Twice a week    Frequency of Social Gatherings with Friends and Family: Twice a week    Attends Religious Services: Never    Marine scientist or Organizations: No    Attends Archivist Meetings: Never    Marital Status: Never married  Intimate Partner Violence: Not At Risk (01/11/2022)   Humiliation, Afraid, Rape, and Kick questionnaire    Fear of Current or Ex-Partner: No    Emotionally Abused: No    Physically Abused: No    Sexually Abused: No    Family History  Problem Relation Age of Onset   Heart disease Mother    Cancer Mother 73       lung   Breast cancer Mother 68   Heart disease Father    Heart attack Sister    Diabetes Sister  type 2   Atrial fibrillation Sister    Sleep apnea Sister     Heart attack Brother    Heart attack Brother    Cancer Maternal Grandmother        breast   Breast cancer Maternal Grandmother    Leukemia Paternal Grandmother        neoplast   Aneurysm Paternal Grandfather        abdominal (stomach)   Colon cancer Neg Hx      Review of Systems  Constitutional: Negative.  Negative for chills and fever.  HENT: Negative.  Negative for congestion and sore throat.   Eyes: Negative.   Respiratory: Negative.  Negative for cough and shortness of breath.   Cardiovascular: Negative.  Negative for chest pain and palpitations.  Gastrointestinal:  Negative for abdominal pain, diarrhea, nausea and vomiting.  Genitourinary: Negative.  Negative for dysuria and hematuria.  Skin: Negative.  Negative for rash.  Neurological: Negative.  Negative for dizziness and headaches.  All other systems reviewed and are negative.  Today's Vitals   04/29/22 0914  BP: 110/68  Pulse: (!) 54  Temp: 98.3 F (36.8 C)  TempSrc: Oral  SpO2: 94%  Weight: 197 lb 8 oz (89.6 kg)  Height: '5\' 2"'  (1.575 m)   Body mass index is 36.12 kg/m.   Physical Exam Vitals reviewed.  Constitutional:      Appearance: Normal appearance.  HENT:     Head: Normocephalic.     Mouth/Throat:     Mouth: Mucous membranes are moist.     Pharynx: Oropharynx is clear.  Eyes:     Extraocular Movements: Extraocular movements intact.     Conjunctiva/sclera: Conjunctivae normal.     Pupils: Pupils are equal, round, and reactive to light.  Cardiovascular:     Rate and Rhythm: Normal rate and regular rhythm.     Pulses: Normal pulses.     Heart sounds: Normal heart sounds.  Pulmonary:     Effort: Pulmonary effort is normal.     Breath sounds: Normal breath sounds.  Abdominal:     Palpations: Abdomen is soft.     Tenderness: There is no abdominal tenderness.  Musculoskeletal:     Cervical back: No tenderness.     Right lower leg: No edema.     Left lower leg: No edema.  Lymphadenopathy:      Cervical: No cervical adenopathy.  Skin:    General: Skin is warm and dry.     Capillary Refill: Capillary refill takes less than 2 seconds.  Neurological:     General: No focal deficit present.     Mental Status: She is alert and oriented to person, place, and time.  Psychiatric:        Mood and Affect: Mood normal.        Behavior: Behavior normal.      ASSESSMENT & PLAN: A total of 49 minutes was spent with the patient and counseling/coordination of care regarding preparing for this visit, review of most recent office visit notes, review of multiple chronic medical problems and their management, review of all medications, Education on nutrition, prognosis, documentation, need for follow-up.  Problem List Items Addressed This Visit       Cardiovascular and Mediastinum   Migraine   Relevant Medications   ondansetron (ZOFRAN-ODT) 8 MG disintegrating tablet   traMADol (ULTRAM) 50 MG tablet   Coronary artery disease involving native coronary artery of native heart with angina pectoris (HCC)    Clinically  stable.  No recent anginal episodes. No need for nitroglycerin use. Continue Plavix 75 mg daily Continue metoprolol succinate 50 mg daily.      Chronic heart failure with preserved ejection fraction (HFpEF) (HCC)    Clinically euvolemic.  No signs or symptoms of congestive heart failure Continue Lasix 20 mg daily.      Hypertension associated with diabetes ([place]) - Primary    Well-controlled hypertension. BP Readings from Last 3 Encounters:  04/29/22 110/68  12/30/21 100/68  12/16/21 110/72  Continue lisinopril 40 mg and hydrochlorothiazide 25 mg. Well-controlled diabetes Lab Results  Component Value Date   HGBA1C 5.6 10/28/2021  Continue weekly Trulicity 7.94 mg        Relevant Orders   CBC with Differential/Platelet   Comprehensive metabolic panel   Hemoglobin A1c   Urine Microalbumin w/creat. ratio     Digestive   GERD (gastroesophageal reflux disease)     Stable.  Asymptomatic.      Relevant Medications   ondansetron (ZOFRAN-ODT) 8 MG disintegrating tablet     Endocrine   Hyperlipidemia associated with type 2 diabetes mellitus ([place])    Stable.  Diet and nutrition discussed. Wt Readings from Last 3 Encounters:  04/29/22 197 lb 8 oz (89.6 kg)  12/30/21 196 lb 3.2 oz (89 kg)  12/16/21 196 lb (88.9 kg)  Continue atorvastatin 80 mg daily.       Relevant Orders   Lipid panel     Other   Environmental allergies   Relevant Medications   fluticasone (FLONASE) 50 MCG/ACT nasal spray   Other Visit Diagnoses     Type 2 diabetes mellitus with diabetic polyneuropathy, without long-term current use of insulin (HCC)       Relevant Medications   glucose blood test strip   Neuropathy       Relevant Medications   traMADol (ULTRAM) 50 MG tablet   Allergic urticaria       Relevant Medications   triamcinolone cream (KENALOG) 0.1 %      Patient Instructions  Health Maintenance After Age 24 After age 48, you are at a higher risk for certain long-term diseases and infections as well as injuries from falls. Falls are a major cause of broken bones and head injuries in people who are older than age 33. Getting regular preventive care can help to keep you healthy and well. Preventive care includes getting regular testing and making lifestyle changes as recommended by your health care provider. Talk with your health care provider about: Which screenings and tests you should have. A screening is a test that checks for a disease when you have no symptoms. A diet and exercise plan that is right for you. What should I know about screenings and tests to prevent falls? Screening and testing are the best ways to find a health problem early. Early diagnosis and treatment give you the best chance of managing medical conditions that are common after age 64. Certain conditions and lifestyle choices may make you more likely to have a fall. Your health care  provider may recommend: Regular vision checks. Poor vision and conditions such as cataracts can make you more likely to have a fall. If you wear glasses, make sure to get your prescription updated if your vision changes. Medicine review. Work with your health care provider to regularly review all of the medicines you are taking, including over-the-counter medicines. Ask your health care provider about any side effects that may make you more likely to have a  fall. Tell your health care provider if any medicines that you take make you feel dizzy or sleepy. Strength and balance checks. Your health care provider may recommend certain tests to check your strength and balance while standing, walking, or changing positions. Foot health exam. Foot pain and numbness, as well as not wearing proper footwear, can make you more likely to have a fall. Screenings, including: Osteoporosis screening. Osteoporosis is a condition that causes the bones to get weaker and break more easily. Blood pressure screening. Blood pressure changes and medicines to control blood pressure can make you feel dizzy. Depression screening. You may be more likely to have a fall if you have a fear of falling, feel depressed, or feel unable to do activities that you used to do. Alcohol use screening. Using too much alcohol can affect your balance and may make you more likely to have a fall. Follow these instructions at home: Lifestyle Do not drink alcohol if: Your health care provider tells you not to drink. If you drink alcohol: Limit how much you have to: 0-1 drink a day for women. 0-2 drinks a day for men. Know how much alcohol is in your drink. In the [place] one drink equals one 12 oz bottle of beer (355 mL), one 5 oz glass of wine (148 mL), or one 1 oz glass of hard liquor (44 mL). Do not use any products that contain nicotine or tobacco. These products include cigarettes, chewing tobacco, and vaping devices, such as e-cigarettes.  If you need help quitting, ask your health care provider. Activity  Follow a regular exercise program to stay fit. This will help you maintain your balance. Ask your health care provider what types of exercise are appropriate for you. If you need a cane or walker, use it as recommended by your health care provider. Wear supportive shoes that have nonskid soles. Safety  Remove any tripping hazards, such as rugs, cords, and clutter. Install safety equipment such as grab bars in bathrooms and safety rails on stairs. Keep rooms and walkways well-lit. General instructions Talk with your health care provider about your risks for falling. Tell your health care provider if: You fall. Be sure to tell your health care provider about all falls, even ones that seem minor. You feel dizzy, tiredness (fatigue), or off-balance. Take over-the-counter and prescription medicines only as told by your health care provider. These include supplements. Eat a healthy diet and maintain a healthy weight. A healthy diet includes low-fat dairy products, low-fat (lean) meats, and fiber from whole grains, beans, and lots of fruits and vegetables. Stay current with your vaccines. Schedule regular health, dental, and eye exams. Summary Having a healthy lifestyle and getting preventive care can help to protect your health and wellness after age 17. Screening and testing are the best way to find a health problem early and help you avoid having a fall. Early diagnosis and treatment give you the best chance for managing medical conditions that are more common for people who are older than age 34. Falls are a major cause of broken bones and head injuries in people who are older than age 15. Take precautions to prevent a fall at home. Work with your health care provider to learn what changes you can make to improve your health and wellness and to prevent falls. This information is not intended to replace advice given to you by  your health care provider. Make sure you discuss any questions you have with your health care provider.  Document Revised: 02/16/2021 Document Reviewed: 02/16/2021 Elsevier Patient Education  [place], MD [facility]

## 2022-04-29 NOTE — Assessment & Plan Note (Addendum)
Clinically euvolemic.  No signs or symptoms of congestive heart failure Continue Lasix 20 mg daily.

## 2022-05-04 ENCOUNTER — Encounter: Payer: Self-pay | Admitting: Internal Medicine

## 2022-05-04 ENCOUNTER — Ambulatory Visit (INDEPENDENT_AMBULATORY_CARE_PROVIDER_SITE_OTHER): Payer: Medicare Other | Admitting: Internal Medicine

## 2022-05-04 VITALS — BP 110/60 | HR 62 | Ht 62.0 in | Wt 197.0 lb

## 2022-05-04 DIAGNOSIS — I495 Sick sinus syndrome: Secondary | ICD-10-CM | POA: Diagnosis not present

## 2022-05-04 DIAGNOSIS — I480 Paroxysmal atrial fibrillation: Secondary | ICD-10-CM

## 2022-05-04 DIAGNOSIS — I5032 Chronic diastolic (congestive) heart failure: Secondary | ICD-10-CM | POA: Diagnosis not present

## 2022-05-04 DIAGNOSIS — Z950 Presence of cardiac pacemaker: Secondary | ICD-10-CM | POA: Diagnosis not present

## 2022-05-04 DIAGNOSIS — I2510 Atherosclerotic heart disease of native coronary artery without angina pectoris: Secondary | ICD-10-CM | POA: Diagnosis not present

## 2022-05-04 LAB — PACEMAKER DEVICE OBSERVATION

## 2022-05-04 LAB — CUP PACEART INCLINIC DEVICE CHECK
Battery Impedance: 1866 Ohm
Battery Remaining Longevity: 46 mo
Battery Voltage: 2.75 V
Brady Statistic AP VP Percent: 0 %
Brady Statistic AP VS Percent: 0 %
Brady Statistic AS VP Percent: 0 %
Brady Statistic AS VS Percent: 100 %
Date Time Interrogation Session: 20230725141400
Implantable Lead Implant Date: 20091201
Implantable Lead Implant Date: 20091201
Implantable Lead Location: 753859
Implantable Lead Location: 753860
Implantable Lead Model: 5076
Implantable Lead Model: 5076
Implantable Pulse Generator Implant Date: 20091201
Lead Channel Impedance Value: 413 Ohm
Lead Channel Impedance Value: 568 Ohm
Lead Channel Pacing Threshold Amplitude: 0.5 V
Lead Channel Pacing Threshold Amplitude: 0.5 V
Lead Channel Pacing Threshold Amplitude: 0.625 V
Lead Channel Pacing Threshold Amplitude: 0.75 V
Lead Channel Pacing Threshold Pulse Width: 0.4 ms
Lead Channel Pacing Threshold Pulse Width: 0.4 ms
Lead Channel Pacing Threshold Pulse Width: 0.4 ms
Lead Channel Pacing Threshold Pulse Width: 0.4 ms
Lead Channel Sensing Intrinsic Amplitude: 2 mV
Lead Channel Sensing Intrinsic Amplitude: 5.6 mV
Lead Channel Setting Pacing Amplitude: 2 V
Lead Channel Setting Pacing Amplitude: 2.5 V
Lead Channel Setting Pacing Pulse Width: 0.4 ms
Lead Channel Setting Sensing Sensitivity: 2 mV

## 2022-05-04 NOTE — Progress Notes (Signed)
Patient Care Team: Horald Pollen, MD as PCP - General (Internal Medicine) End, Harrell Gave, MD as PCP - Cardiology (Cardiology) Himmelrich, Bryson Ha, RD (Inactive) as Dietitian Deboraha Sprang, MD as Attending Physician (Cardiology) Marygrace Drought, MD as Consulting Physician (Ophthalmology) Vickey Huger, MD as Consulting Physician (Orthopedic Surgery)   HPI  [name] [name] is a 66 y.o. female seen in follow-up for pacemaker implantation-Medtronic for  sinus node dysfunction. She paces almost never.   10/17 STEMI  Cath>>  Severe 3 vessel obstructive CAD   . Coronary artery bypass grafting x3 (left internal mammary artery to     LAD, saphenous vein graft to diagonal, saphenous vein graft to     obtuse marginal). The patient denies chest pain,  nocturnal dyspnea, orthopnea or peripheral edema.  There have been no palpitations, lightheadedness or syncope.  DOE only with "extreme effort"  Weight has been down 45 obs and her target is 150 ( now 195)   DATE TEST EF   10/17 TEE 60-65%   9/22 Myoview LV function normal No ischemia    Date Cr K LDL  7/21 0.97 4.7 24   7/23 1.15 5.0 60        Past Medical History:  Diagnosis Date   Allergy    Anal fissure    Anxiety    Arthritis    Asthma    Cataract 2010    left   Colon polyps    diverticulosis  03-2011/2005   Depression    Diabetes mellitus 2012   T2DM   Diverticulosis    Fatty liver 04/2011   GERD (gastroesophageal reflux disease)    Hyperlipidemia    Hypertension severe    Migraine    Obesity    OSA (obstructive sleep apnea)    C-Pap   Pacemaker Medtronic    Sinoatrial node dysfunction ([place])    syncope    Past Surgical History:  Procedure Laterality Date   ABDOMINAL HYSTERECTOMY  2000   total   CARDIAC CATHETERIZATION N/A 07/20/2016   Procedure: Left Heart Cath and Coronary Angiography;  Surgeon: Peter M Martinique, MD;  Location: [facility];  Service: Cardiovascular;  Laterality: N/A;    CATARACT EXTRACTION Left    CORONARY ARTERY BYPASS GRAFT N/A 07/22/2016   Procedure: CORONARY ARTERY BYPASS GRAFTING (CABG) x three , using left internal mammary artery and right leg greater saphenous vein harvested endoscopically;  Surgeon: Ivin Poot, MD;  Location: [facility];  Service: Open Heart Surgery;  Laterality: N/A;   EYE SURGERY Right 2010   MULTIPLE TOOTH EXTRACTIONS     OOPHORECTOMY     PACEMAKER PLACEMENT     T/A right knee     TEE WITHOUT CARDIOVERSION N/A 07/22/2016   Procedure: TRANSESOPHAGEAL ECHOCARDIOGRAM (TEE);  Surgeon: Ivin Poot, MD;  Location: [facility];  Service: Open Heart Surgery;  Laterality: N/A;   TONSILLECTOMY      Current Outpatient Medications  Medication Sig Dispense Refill   Accu-Chek Softclix Lancets lancets USE AS DIRECTED 200 each 3   albuterol (VENTOLIN HFA) 108 (90 Base) MCG/ACT inhaler Inhale 1-2 puffs into the lungs every 6 (six) hours as needed for wheezing or shortness of breath. 6.7 g 3   atorvastatin (LIPITOR) 80 MG tablet TAKE 1 TABLET BY MOUTH ONCE DAILY AT 6 PM 90 tablet 3   Blood Glucose Monitoring Suppl (ACCU-CHEK GUIDE) w/Device KIT 1 Device by Does not apply route in the morning, at noon, and  at bedtime. 1 kit 0   cetirizine (ZYRTEC) 10 MG tablet Take 10 mg by mouth daily.     citalopram (CELEXA) 20 MG tablet TAKE 1 TABLET BY MOUTH  DAILY 90 tablet 3   clopidogrel (PLAVIX) 75 MG tablet TAKE 1 TABLET BY MOUTH  DAILY 90 tablet 1   cyclobenzaprine (FLEXERIL) 10 MG tablet TAKE 1 TABLET BY MOUTH AT  BEDTIME AS NEEDED AND MAY  REPEAT DOSE ONE TIME IF  NEEDED FOR MUSCLE SPASMS  (CAREFUL OF SEDATION) 30 tablet 1   dicyclomine (BENTYL) 20 MG tablet Take 20 mg by mouth 3 (three) times daily.     Dulaglutide (TRULICITY) 1.94 RD/4.0CX SOPN Inject 0.75 mg into the skin once a week. 4 pen 3   fluticasone (FLONASE) 50 MCG/ACT nasal spray USE 2 SPRAYS IN BOTH NOSTRILS  DAILY 48 g 0   fluticasone (FLOVENT HFA) 110 MCG/ACT inhaler Inhale 2 puffs into  the lungs 2 (two) times daily as needed.     furosemide (LASIX) 20 MG tablet TAKE 1 TABLET BY MOUTH  DAILY AS NEEDED FOR EDEMA  SWELLING 90 tablet 3   gabapentin (NEURONTIN) 300 MG capsule TAKE 2 CAPSULES BY MOUTH IN THE MORNING AND 3 CAPSULES  AT BEDTIME 450 capsule 3   glucose blood test strip Use as instructed 200 each 5   hydrochlorothiazide (HYDRODIURIL) 25 MG tablet TAKE 1 TABLET BY MOUTH  DAILY 90 tablet 3   hydrocortisone cream 1 % Apply 1 application topically 2 (two) times daily.     lisinopril (ZESTRIL) 40 MG tablet TAKE 1 TABLET BY MOUTH  DAILY 90 tablet 3   metoprolol succinate (TOPROL-XL) 50 MG 24 hr tablet TAKE 1 TABLET BY MOUTH  TWICE DAILY WITH OR  IMMEDIATELY FOLLOWING A  MEAL 180 tablet 3   Multiple Vitamin (MULTIVITAMIN) tablet Take 1 tablet by mouth daily.     nystatin cream (MYCOSTATIN) Apply 1 application topically as needed for dry skin. 30 g 1   omeprazole (PRILOSEC) 20 MG capsule Take 1 capsule (20 mg total) by mouth daily. 90 capsule 1   ondansetron (ZOFRAN-ODT) 8 MG disintegrating tablet DISSOLVE 1 ON TONGUE EVERY 12 HOURS AS NEEDED FOR NAUSEA 30 tablet 3   polycarbophil (FIBERCON) 625 MG tablet Take by mouth daily.     PRALUENT 75 MG/ML SOAJ INJECT 75 MG SUBCUTANEOUSLY EVERY 14 DAYS 2 mL 3   traMADol (ULTRAM) 50 MG tablet Take 1 tablet (50 mg total) by mouth at bedtime as needed. 30 tablet 1   triamcinolone cream (KENALOG) 0.1 % Apply 1 Application topically as needed. 30 g 2   No current facility-administered medications for this visit.    Allergies  Allergen Reactions   Aspirin Other (See Comments)    Sores in mouth Sores in mouth   Adhesive [Tape] Other (See Comments)    SKIN BLISTERS   Penicillins     Swelling    Red Yeast Rice Hives   Sulfa Drugs Cross Reactors Hives    Review of Systems negative except from HPI and PMH  Physical Exam BP 110/60 (BP Location: Left Arm, Patient Position: Sitting, Cuff Size: Large)   Pulse 62   Ht _0  (1.575 m)    Wt 197 lb (89.4 kg)   SpO2 95%   BMI 36.03 kg/m  Well developed and well nourished in no acute distress HENT normal Neck supple with JVP-flat Clear Device pocket well healed; without hematoma or erythema.  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  No significant interval symptoms.  Longstanding low voltage.  Last echo 2017 showed no evidence of LVH, and so the voltage parameters may be related to her obesity.  We will continue to follow.  May need to exclude amyloid  No interval atrial fibrillation of which she is aware.  Has had interval nonsustained ventricular tachycardia  No interval syncope.  No angina.  Continue on Lipitor and Plavix.  Blood pressure well controlled.  We will continue on metoprolol 50 twice daily and lisinopril 40  Device function is normal. Programming changes   See Paceart for details

## 2022-05-04 NOTE — Patient Instructions (Signed)
Medication Instructions:  - Your physician recommends that you continue on your current medications as directed. Please refer to the Current Medication list given to you today.  *If you need a refill on your cardiac medications before your next appointment, please call your pharmacy*   Lab Work: - none ordered  If you have labs (blood work) drawn today and your tests are completely normal, you will receive your results only by: MyChart Message (if you have MyChart) OR A paper copy in the mail If you have any lab test that is abnormal or we need to change your treatment, we will call you to review the results.   Testing/Procedures: - none ordered   Follow-Up: At [facility], you and your health needs are our priority.  As part of our continuing mission to provide you with exceptional heart care, we have created designated Provider Care Teams.  These Care Teams include your primary Cardiologist (physician) and Advanced Practice Providers (APPs -  Physician Assistants and Nurse Practitioners) who all work together to provide you with the care you need, when you need it.  We recommend signing up for the patient portal called "MyChart".  Sign up information is provided on this After Visit Summary.  MyChart is used to connect with patients for Virtual Visits (Telemedicine).  Patients are able to view lab/test results, encounter notes, upcoming appointments, etc.  Non-urgent messages can be sent to your provider as well.   To learn more about what you can do with MyChart, go to [email or web address].    Your next appointment:   1 year(s)  The format for your next appointment:   In Person  Provider:   Steven Klein, MD    Other Instructions N/a  Important Information About Sugar       

## 2022-05-05 ENCOUNTER — Encounter: Payer: Self-pay | Admitting: Internal Medicine

## 2022-05-07 ENCOUNTER — Other Ambulatory Visit: Payer: Self-pay | Admitting: *Deleted

## 2022-05-07 DIAGNOSIS — L500 Allergic urticaria: Secondary | ICD-10-CM

## 2022-05-07 MED ORDER — TRIAMCINOLONE ACETONIDE 0.1 % EX CREA: 1.0000 | TOPICAL_CREAM | Freq: Every day | CUTANEOUS | 1 refills | Status: DC | PRN

## 2022-05-08 ENCOUNTER — Other Ambulatory Visit: Payer: Self-pay | Admitting: Emergency Medicine

## 2022-05-22 ENCOUNTER — Other Ambulatory Visit: Payer: Self-pay | Admitting: Emergency Medicine

## 2022-05-22 DIAGNOSIS — E1142 Type 2 diabetes mellitus with diabetic polyneuropathy: Secondary | ICD-10-CM

## 2022-05-24 MED ORDER — ACCU-CHEK GUIDE W/DEVICE KIT: 1.0000 | PACK | Freq: Three times a day (TID) | 0 refills | Status: AC

## 2022-05-31 ENCOUNTER — Other Ambulatory Visit: Payer: Self-pay | Admitting: Internal Medicine

## 2022-06-01 ENCOUNTER — Telehealth: Payer: Self-pay | Admitting: Emergency Medicine

## 2022-06-01 DIAGNOSIS — E1142 Type 2 diabetes mellitus with diabetic polyneuropathy: Secondary | ICD-10-CM

## 2022-06-01 MED ORDER — GLUCOSE BLOOD VI STRP: ORAL_STRIP | 5 refills | Status: AC

## 2022-06-01 NOTE — Telephone Encounter (Signed)
Rx sent 

## 2022-06-01 NOTE — Telephone Encounter (Signed)
Caller & Relationship to patient: Cierria Height  Call back number: [phone]  Date of last office visit: 04/29/22  Date of next office visit: 11/02/22  Medication(s) to be refilled: glucose blood test strip   Preferred Pharmacy:  [facility], [place] Phone:  [phone]  Fax:  [phone]

## 2022-06-03 DIAGNOSIS — G4733 Obstructive sleep apnea (adult) (pediatric): Secondary | ICD-10-CM | POA: Diagnosis not present

## 2022-06-08 DIAGNOSIS — H26492 Other secondary cataract, left eye: Secondary | ICD-10-CM | POA: Diagnosis not present

## 2022-06-09 ENCOUNTER — Ambulatory Visit (INDEPENDENT_AMBULATORY_CARE_PROVIDER_SITE_OTHER): Payer: Medicare Other | Admitting: Emergency Medicine

## 2022-06-09 ENCOUNTER — Encounter: Payer: Self-pay | Admitting: Emergency Medicine

## 2022-06-09 VITALS — BP 110/64 | HR 65 | Temp 98.4°F | Ht 62.0 in | Wt 199.1 lb

## 2022-06-09 DIAGNOSIS — M009 Pyogenic arthritis, unspecified: Secondary | ICD-10-CM

## 2022-06-09 LAB — CBC WITH DIFFERENTIAL/PLATELET
Basophils Absolute: 0 10*3/uL (ref 0.0–0.1)
Basophils Relative: 0.8 % (ref 0.0–3.0)
Eosinophils Absolute: 0.1 10*3/uL (ref 0.0–0.7)
Eosinophils Relative: 3.1 % (ref 0.0–5.0)
HCT: 34.8 % — ABNORMAL LOW (ref 36.0–46.0)
Hemoglobin: 12.1 g/dL (ref 12.0–15.0)
Lymphocytes Relative: 27.7 % (ref 12.0–46.0)
Lymphs Abs: 1.4 10*3/uL (ref 0.7–4.0)
MCHC: 34.8 g/dL (ref 30.0–36.0)
MCV: 93.4 fl (ref 78.0–100.0)
Monocytes Absolute: 0.5 10*3/uL (ref 0.1–1.0)
Monocytes Relative: 9.3 % (ref 3.0–12.0)
Neutro Abs: 2.9 10*3/uL (ref 1.4–7.7)
Neutrophils Relative %: 59.1 % (ref 43.0–77.0)
Platelets: 216 10*3/uL (ref 150.0–400.0)
RBC: 3.73 Mil/uL — ABNORMAL LOW (ref 3.87–5.11)
RDW: 13 % (ref 11.5–15.5)
WBC: 4.9 10*3/uL (ref 4.0–10.5)

## 2022-06-09 LAB — COMPREHENSIVE METABOLIC PANEL
ALT: 11 U/L (ref 0–35)
AST: 20 U/L (ref 0–37)
Albumin: 4.5 g/dL (ref 3.5–5.2)
Alkaline Phosphatase: 81 U/L (ref 39–117)
BUN: 22 mg/dL (ref 6–23)
CO2: 27 mEq/L (ref 19–32)
Calcium: 9.2 mg/dL (ref 8.4–10.5)
Chloride: 102 mEq/L (ref 96–112)
Creatinine, Ser: 1.15 mg/dL (ref 0.40–1.20)
GFR: 49.06 mL/min — ABNORMAL LOW (ref >60.00)
Glucose, Bld: 150 mg/dL — ABNORMAL HIGH (ref 70–99)
Potassium: 3.7 mEq/L (ref 3.5–5.1)
Sodium: 140 mEq/L (ref 135–145)
Total Bilirubin: 0.5 mg/dL (ref 0.2–1.2)
Total Protein: 7.4 g/dL (ref 6.0–8.3)

## 2022-06-09 LAB — SEDIMENTATION RATE: Sed Rate: 25 mm/hr (ref 0–30)

## 2022-06-09 LAB — URIC ACID: Uric Acid, Serum: 9.6 mg/dL — ABNORMAL HIGH (ref 2.4–7.0)

## 2022-06-09 MED ORDER — CIPROFLOXACIN HCL 500 MG PO TABS: 500.0000 mg | ORAL_TABLET | Freq: Two times a day (BID) | ORAL | 0 refills | Status: AC

## 2022-06-09 MED ORDER — PREDNISONE 20 MG PO TABS: 40.0000 mg | ORAL_TABLET | Freq: Every day | ORAL | 0 refills | Status: AC

## 2022-06-09 NOTE — Patient Instructions (Signed)
Arthritis ?Arthritis means joint pain. It can also mean joint disease. A joint is a place where bones come together. There are more than 100 types of arthritis. ?What are the causes? ?Wear and tear of a joint. This is the most common cause. ?Too much of a chemical called uric acid in the blood, which leads to pain in the joint (gout). ?Pain and swelling (inflammation) in a joint. ?Infection of a joint. ?Injuries in the joint. ?A reaction to medicines (allergy). ?In some cases, the cause may not be known. ?What are the signs or symptoms? ?Pain in a joint when moving. ?Redness at a joint. ?Swelling at a joint. ?Stiffness at a joint. ?Warmth coming from the joint. ?A fever. ?A feeling of being sick. ?How is this treated? ?This condition may be treated with: ?Treating the cause, if it is known. ?Rest. ?Raising (elevating) the joint. ?Putting cold or hot packs on the joint. ?Medicines to treat symptoms and reduce pain and swelling. ?Shots (injections) of medicines, such as cortisone, into the joint. ?You may also be told to make changes in your life, such as doing exercises and losing weight. ?Follow these instructions at home: ?Medicines ?Take over-the-counter and prescription medicines only as told by your doctor. ?Do not take aspirin for pain if your doctor says that you may have gout. ?Activity ?Rest your joint if your doctor tells you to. ?Avoid activities that make the pain worse. ?Exercise your joint regularly as told by your doctor. Try doing exercises like: ?Swimming. ?Water aerobics. ?Biking. ?Walking. ?Managing pain, stiffness, and swelling ? ?  ? ?If told, put ice on the affected area. To do this: ?Put ice in a plastic bag. ?Place a towel between your skin and the bag. ?Leave the ice on for 20 minutes, 2-3 times a day. ?Take off the ice if your skin turns bright red. This is very important. If you cannot feel pain, heat, or cold, you have a greater risk of damage to the area. ?If your joint is swollen, raise  (elevate) it above the level of your heart if told by your doctor. ?If your joint feels stiff in the morning, try taking a warm shower. ?If told, put heat on the affected area. Do this as often as told by your doctor. Use the heat source that your doctor recommends, such as a moist heat pack or a heating pad. If you have diabetes, do not apply heat without asking your doctor. To apply heat: ?Place a towel between your skin and the heat source. ?Leave the heat on for 20-30 minutes. ?Take off the heat if your skin turns bright red. This is very important. If you cannot feel pain, heat, or cold, you have a greater risk of getting burned. ?General instructions ?Maintain a healthy weight. Follow instructions from your doctor for weight control. ?Do not smoke or use any products that contain nicotine or tobacco. If you need help quitting, ask your doctor. ?Keep all follow-up visits. ?Where to find more information ?[employer]: www.niams.nih.gov ?Contact a doctor if: ?The pain gets worse. ?You have a fever. ?Get help right away if: ?You have very bad pain in your joint. ?You have swelling in your joint. ?Your joint is red. ?Many joints become painful and swollen. ?You have very bad back pain. ?Your leg is very weak. ?Summary ?Arthritis means joint pain. It can also mean joint disease. A joint is a place where bones come together. ?The most common cause of this condition is wear and   tear of a joint. ?Symptoms of this condition include redness, swelling, or stiffness of the joint. ?This condition is treated with rest, raising the joint, medicines, and putting cold or hot packs on the joint. ?Follow your doctor's instructions about medicines, activity, exercises, and other home care treatments. ?This information is not intended to replace advice given to you by your health care provider. Make sure you discuss any questions you have with your health care provider. ?Document Revised: 07/07/2021 Document  Reviewed: 07/07/2021 ?Elsevier Patient Education ? 2023 [employer] ? ?

## 2022-06-09 NOTE — Progress Notes (Signed)
[name] [name] 66 y.o.   Chief Complaint  Patient presents with   Hand Pain    Right index finger pain, and swelling    HISTORY OF PRESENT ILLNESS: Acute problem visit today. This is a 66 y.o. female complaining of pain, redness, swelling to distal right index finger since the end of June.  Denies injury.  No history of gout or arthritis in general. No other associated symptoms. No other complaints or medical concerns today.  Hand Pain  Pertinent negatives include no chest pain.     Prior to Admission medications   Medication Sig Start Date End Date Taking? Authorizing Provider  Accu-Chek Softclix Lancets lancets USE AS DIRECTED 05/10/21  Yes Alizee Maple, Ines Bloomer, MD  albuterol (VENTOLIN HFA) 108 (90 Base) MCG/ACT inhaler Inhale 1-2 puffs into the lungs every 6 (six) hours as needed for wheezing or shortness of breath. 04/17/19  Yes Darden Flemister, Ines Bloomer, MD  atorvastatin (LIPITOR) 80 MG tablet TAKE 1 TABLET BY MOUTH ONCE DAILY AT 6 PM 12/29/21  Yes Martin Belling, Ines Bloomer, MD  Blood Glucose Monitoring Suppl (ACCU-CHEK GUIDE) w/Device KIT 1 Device by Does not apply route in the morning, at noon, and at bedtime. 05/24/22  Yes Wrangler Penning, Ines Bloomer, MD  cetirizine (ZYRTEC) 10 MG tablet Take 10 mg by mouth daily.   Yes [provider]  ciprofloxacin (CIPRO) 500 MG tablet Take 1 tablet (500 mg total) by mouth 2 (two) times daily for 7 days. 06/09/22 06/16/22 Yes Marlana Mckowen, Ines Bloomer, MD  citalopram (CELEXA) 20 MG tablet TAKE 1 TABLET BY MOUTH  DAILY 12/20/21  Yes Ariday Brinker, Ines Bloomer, MD  clopidogrel (PLAVIX) 75 MG tablet TAKE 1 TABLET BY MOUTH  DAILY 03/30/22  Yes End, Harrell Gave, MD  dicyclomine (BENTYL) 20 MG tablet Take 20 mg by mouth 3 (three) times daily. 07/11/21  Yes [provider]  Dulaglutide (TRULICITY) 7.41 OI/7.8MV SOPN Inject 0.75 mg into the skin once a week. 10/30/19  Yes Frazier Balfour, Ines Bloomer, MD  fluticasone [facility]) 50 MCG/ACT nasal spray USE 2 SPRAYS IN BOTH  NOSTRILS  DAILY 04/29/22  Yes Claudette Wermuth, Ines Bloomer, MD  fluticasone (FLOVENT HFA) 110 MCG/ACT inhaler Inhale 2 puffs into the lungs 2 (two) times daily as needed.   Yes [provider]  furosemide (LASIX) 20 MG tablet TAKE 1 TABLET BY MOUTH  DAILY AS NEEDED FOR EDEMA  SWELLING 12/21/21  Yes End, Harrell Gave, MD  gabapentin (NEURONTIN) 300 MG capsule TAKE 2 CAPSULES BY MOUTH IN THE MORNING AND 3 CAPSULES  AT BEDTIME 04/29/22  Yes Jafet Wissing, Ines Bloomer, MD  glucose blood test strip Use as instructed 06/01/22  Yes Ozias Dicenzo, Ines Bloomer, MD  hydrochlorothiazide (HYDRODIURIL) 25 MG tablet TAKE 1 TABLET BY MOUTH  DAILY 12/21/21  Yes End, Harrell Gave, MD  hydrocortisone cream 1 % Apply 1 application topically 2 (two) times daily.   Yes [provider]  lisinopril (ZESTRIL) 40 MG tablet TAKE 1 TABLET BY MOUTH  DAILY 12/29/21  Yes Macaila Tahir, Ines Bloomer, MD  metoprolol succinate (TOPROL-XL) 50 MG 24 hr tablet TAKE 1 TABLET BY MOUTH  TWICE DAILY WITH OR  IMMEDIATELY FOLLOWING A  MEAL 05/09/22  Yes Jarret Torre, Ines Bloomer, MD  Multiple Vitamin (MULTIVITAMIN) tablet Take 1 tablet by mouth daily. 08/30/16  Yes Lars Pinks M, PA-C  nystatin cream (MYCOSTATIN) Apply 1 application topically as needed for dry skin. 04/28/20  Yes Irvine Glorioso, Ines Bloomer, MD  omeprazole (PRILOSEC) 20 MG capsule Take 1 capsule (20 mg total) by mouth daily. 12/15/16  Yes English, Colletta [place] D, PA  ondansetron (ZOFRAN-ODT) 8 MG disintegrating tablet DISSOLVE 1 ON TONGUE EVERY 12 HOURS AS NEEDED FOR NAUSEA 04/29/22  Yes Adahlia Stembridge, Ines Bloomer, MD  polycarbophil (FIBERCON) 625 MG tablet Take by mouth daily.   Yes [provider]  PRALUENT 75 MG/ML SOAJ INJECT 75 MG SUBCUTANEOUSLY EVERY 14 DAYS 05/31/22  Yes End, Harrell Gave, MD  predniSONE (DELTASONE) 20 MG tablet Take 2 tablets (40 mg total) by mouth daily with breakfast for 5 days. 06/09/22 06/14/22 Yes Chisum Habenicht, Ines Bloomer, MD  traMADol (ULTRAM) 50 MG tablet Take 1 tablet  (50 mg total) by mouth at bedtime as needed. 04/29/22  Yes Lyndon Chenoweth, Ines Bloomer, MD  triamcinolone cream (KENALOG) 0.1 % Apply 1 Application topically daily as needed. 05/07/22  Yes Havelock, Ines Bloomer, MD    Allergies  Allergen Reactions   Aspirin Other (See Comments)    Sores in mouth Sores in mouth   Adhesive [Tape] Other (See Comments)    SKIN BLISTERS   Penicillins     Swelling    Red Yeast Rice Hives   Sulfa Drugs Cross Reactors Hives    Patient Active Problem List   Diagnosis Date Noted   Chronic left shoulder pain 10/28/2021   Hyperlipidemia associated with type 2 diabetes mellitus ([place]) 12/10/2020   Trigger finger, right middle finger 11/07/2019   Hypertension associated with diabetes ([place]) 10/29/2019   Chronic heart failure with preserved ejection fraction (HFpEF) ([place]) 05/25/2019   Coronary artery disease involving native coronary artery of native heart with angina pectoris ([place]) 02/14/2018   Morbid obesity ([place]) 02/14/2018   Irritable bowel syndrome 06/28/2017   S/P CABG x 3 07/22/2016   Multiple vessel coronary artery disease 07/21/2016   Environmental allergies 04/26/2016   Sinus node dysfunction (HCC) 04/26/2016   Pacemaker Medtronic    OSA (obstructive sleep apnea) 05/25/2011   Hx of adenomatous colonic polyps 05/25/2011   Migraine 05/25/2011   Diabetes mellitus type 2, uncontrolled, with complications 33/82/5053   GERD (gastroesophageal reflux disease) 05/25/2011   NAFLD (nonalcoholic fatty liver disease) 05/25/2011   Hyperlipidemia LDL goal <70 05/25/2011   Essential hypertension 97/67/3419   DIASTOLIC HEART FAILURE, CHRONIC 09/16/2009    Past Medical History:  Diagnosis Date   Allergy    Anal fissure    Anxiety    Arthritis    Asthma    Cataract 2010    left   Colon polyps    diverticulosis  03-2011/2005   Depression    Diabetes mellitus 2012   T2DM   Diverticulosis    Fatty liver 04/2011   GERD (gastroesophageal reflux disease)     Hyperlipidemia    Hypertension severe    Migraine    Obesity    OSA (obstructive sleep apnea)    C-Pap   Pacemaker Medtronic    Sinoatrial node dysfunction ([place])    syncope    Past Surgical History:  Procedure Laterality Date   ABDOMINAL HYSTERECTOMY  2000   total   CARDIAC CATHETERIZATION N/A 07/20/2016   Procedure: Left Heart Cath and Coronary Angiography;  Surgeon: Peter M Martinique, MD;  Location: [place] [facility];  Service: Cardiovascular;  Laterality: N/A;   CATARACT EXTRACTION Left    CORONARY ARTERY BYPASS GRAFT N/A 07/22/2016   Procedure: CORONARY ARTERY BYPASS GRAFTING (CABG) x three , using left internal mammary artery and right leg greater saphenous vein harvested endoscopically;  Surgeon: Ivin Poot, MD;  Location: [place];  Service: Open Heart Surgery;  Laterality: N/A;   EYE SURGERY Right 2010   MULTIPLE TOOTH EXTRACTIONS     OOPHORECTOMY     PACEMAKER PLACEMENT     T/A right knee     TEE WITHOUT CARDIOVERSION N/A 07/22/2016   Procedure: TRANSESOPHAGEAL ECHOCARDIOGRAM (TEE);  Surgeon: Ivin Poot, MD;  Location: [place];  Service: Open Heart Surgery;  Laterality: N/A;   TONSILLECTOMY      Social History   Socioeconomic History   Marital status: Single    Spouse name: Not on file   Number of children: 0   Years of education: Not on file   Highest education level: Not on file  Occupational History   Occupation: chef    Employer: [employer]  Tobacco Use   Smoking status: Former    Types: Cigarettes    Quit date: 1995    Years since quitting: 28.6   Smokeless tobacco: Never   Tobacco comments:    Quit 20 yrs ago  Vaping Use   Vaping Use: Never used  Substance and Sexual Activity   Alcohol use: No    Alcohol/week: 0.0 standard drinks of alcohol   Drug use: No   Sexual activity: Yes    Birth control/protection: Post-menopausal, Other-see comments    Comment: Monogamous   Other Topics Concern   Not on file  Social History Narrative    Single. Exercise: No. Education: College.   Social Determinants of Health   Financial Resource Strain: Low Risk  (01/11/2022)   Overall Financial Resource Strain (CARDIA)    Difficulty of Paying Living Expenses: Not hard at all  Food Insecurity: No Food Insecurity (01/11/2022)   Hunger Vital Sign    Worried About Running Out of Food in the Last Year: Never true    Ran Out of Food in the Last Year: Never true  Transportation Needs: No Transportation Needs (01/11/2022)   PRAPARE - Hydrologist (Medical): No    Lack of Transportation (Non-Medical): No  Physical Activity: Sufficiently Active (01/11/2022)   Exercise Vital Sign    Days of Exercise per Week: 4 days    Minutes of Exercise per Session: 40 min  Stress: No Stress Concern Present (01/11/2022)   [place]    Feeling of Stress : Not at all  Social Connections: Socially Isolated (01/11/2022)   Social Connection and Isolation Panel [NHANES]    Frequency of Communication with Friends and Family: Twice a week    Frequency of Social Gatherings with Friends and Family: Twice a week    Attends Religious Services: Never    Marine scientist or Organizations: No    Attends Archivist Meetings: Never    Marital Status: Never married  Intimate Partner Violence: Not At Risk (01/11/2022)   Humiliation, Afraid, Rape, and Kick questionnaire    Fear of Current or Ex-Partner: No    Emotionally Abused: No    Physically Abused: No    Sexually Abused: No    Family History  Problem Relation Age of Onset   Heart disease Mother    Cancer Mother 63       lung   Breast cancer Mother 72   Heart disease Father    Heart attack Sister    Diabetes Sister        type 2   Atrial fibrillation Sister    Sleep apnea Sister    Heart attack Brother  Heart attack Brother    Cancer Maternal Grandmother        breast   Breast cancer Maternal  Grandmother    Leukemia Paternal Grandmother        neoplast   Aneurysm Paternal Grandfather        abdominal (stomach)   Colon cancer Neg Hx      Review of Systems  Constitutional: Negative.  Negative for chills and fever.  HENT: Negative.  Negative for congestion and sore throat.   Respiratory: Negative.  Negative for cough and shortness of breath.   Cardiovascular: Negative.  Negative for chest pain and palpitations.  Gastrointestinal:  Negative for nausea and vomiting.  Skin: Negative.   Neurological: Negative.  Negative for dizziness and headaches.  All other systems reviewed and are negative.  Today's Vitals   06/09/22 1306  BP: 110/64  Pulse: 65  Temp: 98.4 F (36.9 C)  TempSrc: Oral  SpO2: 92%  Weight: 199 lb 2 oz (90.3 kg)  Height: '5\' 2"'  (1.575 m)   Body mass index is 36.42 kg/m.   Physical Exam Vitals reviewed.  Constitutional:      Appearance: Normal appearance.  HENT:     Head: Normocephalic.  Eyes:     Extraocular Movements: Extraocular movements intact.  Cardiovascular:     Rate and Rhythm: Normal rate.  Pulmonary:     Effort: Pulmonary effort is normal.  Skin:    Capillary Refill: Capillary refill takes less than 2 seconds.     Comments: Right index finger: Positive erythema and swelling to the IP joint.  Slightly tender to palpation.  Neurovascularly intact.  Neurological:     General: No focal deficit present.     Mental Status: She is alert and oriented to person, place, and time.  Psychiatric:        Mood and Affect: Mood normal.        Behavior: Behavior normal.       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 Isolated to right DIP joint of index finger. No injuries.  No open wounds. We will start antibiotics today.  Cipro 500 mg twice a  day for 7 days Prednisone for inflammation 40 mg daily for 5 days Follow-up in 1 week.      Relevant Medications   predniSONE (DELTASONE) 20 MG tablet   ciprofloxacin (CIPRO) 500 MG tablet   Other Relevant Orders   Uric acid   Comprehensive metabolic panel   CBC with Differential/Platelet   Sedimentation rate    Patient Instructions  Arthritis Arthritis means joint pain. It can also mean joint disease. A joint is a place where bones come together. There are more than 100 types of arthritis. What are the causes? Wear and tear of a joint. This is the most common cause. Too much of a chemical called uric acid in the blood, which leads to pain in the joint (gout). Pain and swelling (inflammation) in a joint. Infection of a joint. Injuries in the joint. A reaction to medicines (allergy). In some cases, the cause may not be known. What are the signs or symptoms? Pain in a joint when moving. Redness at a joint. Swelling at a joint. Stiffness at a joint. Warmth coming from the joint. A fever. A feeling of  being sick. How is this treated? This condition may be treated with: Treating the cause, if it is known. Rest. Raising (elevating) the joint. Putting cold or hot packs on the joint. Medicines to treat symptoms and reduce pain and swelling. Shots (injections) of medicines, such as cortisone, into the joint. You may also be told to make changes in your life, such as doing exercises and losing weight. Follow these instructions at home: Medicines Take over-the-counter and prescription medicines only as told by your doctor. Do not take aspirin for pain if your doctor says that you may have gout. Activity Rest your joint if your doctor tells you to. Avoid activities that make the pain worse. Exercise your joint regularly as told by your doctor. Try doing exercises like: Swimming. Water aerobics. Biking. Walking. Managing pain, stiffness, and swelling     If told, put ice  on the affected area. To do this: Put ice in a plastic bag. Place a towel between your skin and the bag. Leave the ice on for 20 minutes, 2-3 times a day. Take off the ice if your skin turns bright red. This is very important. If you cannot feel pain, heat, or cold, you have a greater risk of damage to the area. If your joint is swollen, raise (elevate) it above the level of your heart if told by your doctor. If your joint feels stiff in the morning, try taking a warm shower. If told, put heat on the affected area. Do this as often as told by your doctor. Use the heat source that your doctor recommends, such as a moist heat pack or a heating pad. If you have diabetes, do not apply heat without asking your doctor. To apply heat: Place a towel between your skin and the heat source. Leave the heat on for 20-30 minutes. Take off the heat if your skin turns bright red. This is very important. If you cannot feel pain, heat, or cold, you have a greater risk of getting burned. General instructions Maintain a healthy weight. Follow instructions from your doctor for weight control. Do not smoke or use any products that contain nicotine or tobacco. If you need help quitting, ask your doctor. Keep all follow-up visits. Where to find more information [employer] of Health: www.niams.SouthExposed.es Contact a doctor if: The pain gets worse. You have a fever. Get help right away if: You have very bad pain in your joint. You have swelling in your joint. Your joint is red. Many joints become painful and swollen. You have very bad back pain. Your leg is very weak. Summary Arthritis means joint pain. It can also mean joint disease. A joint is a place where bones come together. The most common cause of this condition is wear and tear of a joint. Symptoms of this condition include redness, swelling, or stiffness of the joint. This condition is treated with rest, raising the joint, medicines, and putting  cold or hot packs on the joint. Follow your doctor's instructions about medicines, activity, exercises, and other home care treatments. This information is not intended to replace advice given to you by your health care provider. Make sure you discuss any questions you have with your health care provider. Document Revised: 07/07/2021 Document Reviewed: 07/07/2021 Elsevier Patient Education  [place], MD [facility]

## 2022-06-09 NOTE — Assessment & Plan Note (Signed)
No history of gout or any other form of arthritis. Isolated to right DIP joint of index finger. No injuries.  No open wounds. We will start antibiotics today.  Cipro 500 mg twice a day for 7 days Prednisone for inflammation 40 mg daily for 5 days Follow-up in 1 week.

## 2022-06-10 ENCOUNTER — Other Ambulatory Visit: Payer: Self-pay | Admitting: Emergency Medicine

## 2022-06-10 DIAGNOSIS — E790 Hyperuricemia without signs of inflammatory arthritis and tophaceous disease: Secondary | ICD-10-CM | POA: Insufficient documentation

## 2022-06-10 MED ORDER — ALLOPURINOL 100 MG PO TABS: 100.0000 mg | ORAL_TABLET | Freq: Every day | ORAL | 6 refills | Status: DC

## 2022-06-11 ENCOUNTER — Ambulatory Visit (INDEPENDENT_AMBULATORY_CARE_PROVIDER_SITE_OTHER): Payer: Medicare Other

## 2022-06-11 DIAGNOSIS — I495 Sick sinus syndrome: Secondary | ICD-10-CM | POA: Diagnosis not present

## 2022-06-15 ENCOUNTER — Encounter: Payer: Self-pay | Admitting: Emergency Medicine

## 2022-06-15 LAB — CUP PACEART REMOTE DEVICE CHECK
Battery Impedance: 1990 Ohm
Battery Remaining Longevity: 43 mo
Battery Voltage: 2.75 V
Brady Statistic AP VP Percent: 0 %
Brady Statistic AP VS Percent: 0 %
Brady Statistic AS VP Percent: 0 %
Brady Statistic AS VS Percent: 100 %
Date Time Interrogation Session: 20230901210532
Implantable Lead Implant Date: 20091201
Implantable Lead Implant Date: 20091201
Implantable Lead Location: 753859
Implantable Lead Location: 753860
Implantable Lead Model: 5076
Implantable Lead Model: 5076
Implantable Pulse Generator Implant Date: 20091201
Lead Channel Impedance Value: 435 Ohm
Lead Channel Impedance Value: 559 Ohm
Lead Channel Pacing Threshold Amplitude: 0.625 V
Lead Channel Pacing Threshold Amplitude: 0.75 V
Lead Channel Pacing Threshold Pulse Width: 0.4 ms
Lead Channel Pacing Threshold Pulse Width: 0.4 ms
Lead Channel Setting Pacing Amplitude: 2 V
Lead Channel Setting Pacing Amplitude: 2.5 V
Lead Channel Setting Pacing Pulse Width: 0.4 ms
Lead Channel Setting Sensing Sensitivity: 2 mV

## 2022-06-16 ENCOUNTER — Other Ambulatory Visit: Payer: Self-pay | Admitting: Emergency Medicine

## 2022-06-16 DIAGNOSIS — M009 Pyogenic arthritis, unspecified: Secondary | ICD-10-CM

## 2022-06-16 MED ORDER — PREDNISONE 20 MG PO TABS: 20.0000 mg | ORAL_TABLET | Freq: Every day | ORAL | 0 refills | Status: AC

## 2022-06-16 NOTE — Telephone Encounter (Signed)
Restart prednisone 20 mg daily for 5 days.  We will send new prescription today.  Thanks.

## 2022-06-30 ENCOUNTER — Other Ambulatory Visit: Payer: Self-pay

## 2022-06-30 ENCOUNTER — Encounter: Payer: Self-pay | Admitting: Internal Medicine

## 2022-06-30 ENCOUNTER — Other Ambulatory Visit: Payer: Self-pay | Admitting: Internal Medicine

## 2022-06-30 MED ORDER — CLOPIDOGREL BISULFATE 75 MG PO TABS: 75.0000 mg | ORAL_TABLET | Freq: Every day | ORAL | 1 refills | Status: DC

## 2022-06-30 NOTE — Progress Notes (Signed)
Remote pacemaker transmission.   

## 2022-07-26 ENCOUNTER — Other Ambulatory Visit: Payer: Self-pay | Admitting: Emergency Medicine

## 2022-07-26 DIAGNOSIS — E1142 Type 2 diabetes mellitus with diabetic polyneuropathy: Secondary | ICD-10-CM

## 2022-07-27 ENCOUNTER — Other Ambulatory Visit: Payer: Self-pay | Admitting: Internal Medicine

## 2022-08-10 ENCOUNTER — Other Ambulatory Visit: Payer: Self-pay | Admitting: *Deleted

## 2022-08-10 ENCOUNTER — Telehealth: Payer: Self-pay | Admitting: Emergency Medicine

## 2022-08-10 DIAGNOSIS — E1142 Type 2 diabetes mellitus with diabetic polyneuropathy: Secondary | ICD-10-CM

## 2022-08-10 MED ORDER — TRULICITY 0.75 MG/0.5ML ~~LOC~~ SOAJ: SUBCUTANEOUS | 1 refills | Status: DC

## 2022-08-10 NOTE — Telephone Encounter (Signed)
Patient called to inform PCP of RX problem:  TRULCITY instructions need to be changed.   Script said take one shot.  Please correct to say TAKE ONE SHOT PER WEEK.

## 2022-08-10 NOTE — Telephone Encounter (Signed)
Called patient and re sent medication to pharmacy for 90 days

## 2022-08-17 ENCOUNTER — Telehealth: Payer: Self-pay | Admitting: *Deleted

## 2022-08-17 NOTE — Telephone Encounter (Signed)
Called patient and left message in reference to [facility] patient assistance application

## 2022-08-18 ENCOUNTER — Telehealth: Payer: Self-pay | Admitting: Emergency Medicine

## 2022-08-18 NOTE — Telephone Encounter (Signed)
Pt called to report no paperwork is needed. All they needed was a prescription adjustment. Pt received medication in the mail today. She says thank you so very much.

## 2022-08-25 DIAGNOSIS — K580 Irritable bowel syndrome with diarrhea: Secondary | ICD-10-CM | POA: Diagnosis not present

## 2022-09-07 ENCOUNTER — Other Ambulatory Visit: Payer: Self-pay | Admitting: Internal Medicine

## 2022-09-10 ENCOUNTER — Ambulatory Visit (INDEPENDENT_AMBULATORY_CARE_PROVIDER_SITE_OTHER): Payer: Medicare Other

## 2022-09-10 DIAGNOSIS — I495 Sick sinus syndrome: Secondary | ICD-10-CM | POA: Diagnosis not present

## 2022-09-11 LAB — CUP PACEART REMOTE DEVICE CHECK
Battery Impedance: 2111 Ohm
Battery Remaining Longevity: 41 mo
Battery Voltage: 2.75 V
Brady Statistic AP VP Percent: 0 %
Brady Statistic AP VS Percent: 0 %
Brady Statistic AS VP Percent: 1 %
Brady Statistic AS VS Percent: 99 %
Date Time Interrogation Session: 20231202002619
Implantable Lead Connection Status: 753985
Implantable Lead Connection Status: 753985
Implantable Lead Implant Date: 20091201
Implantable Lead Implant Date: 20091201
Implantable Lead Location: 753859
Implantable Lead Location: 753860
Implantable Lead Model: 5076
Implantable Lead Model: 5076
Implantable Pulse Generator Implant Date: 20091201
Lead Channel Impedance Value: 418 Ohm
Lead Channel Impedance Value: 542 Ohm
Lead Channel Pacing Threshold Amplitude: 0.5 V
Lead Channel Pacing Threshold Amplitude: 0.875 V
Lead Channel Pacing Threshold Pulse Width: 0.4 ms
Lead Channel Pacing Threshold Pulse Width: 0.4 ms
Lead Channel Setting Pacing Amplitude: 2 V
Lead Channel Setting Pacing Amplitude: 2.5 V
Lead Channel Setting Pacing Pulse Width: 0.4 ms
Lead Channel Setting Sensing Sensitivity: 2 mV
Zone Setting Status: 755011
Zone Setting Status: 755011

## 2022-09-23 DIAGNOSIS — G4733 Obstructive sleep apnea (adult) (pediatric): Secondary | ICD-10-CM | POA: Diagnosis not present

## 2022-09-28 NOTE — Progress Notes (Signed)
Remote pacemaker transmission.   

## 2022-10-08 ENCOUNTER — Other Ambulatory Visit: Payer: Self-pay | Admitting: Emergency Medicine

## 2022-10-08 DIAGNOSIS — I2510 Atherosclerotic heart disease of native coronary artery without angina pectoris: Secondary | ICD-10-CM

## 2022-10-09 ENCOUNTER — Other Ambulatory Visit: Payer: Self-pay | Admitting: Emergency Medicine

## 2022-10-09 DIAGNOSIS — G43109 Migraine with aura, not intractable, without status migrainosus: Secondary | ICD-10-CM

## 2022-10-15 ENCOUNTER — Other Ambulatory Visit (HOSPITAL_COMMUNITY): Payer: Self-pay

## 2022-10-29 ENCOUNTER — Other Ambulatory Visit: Payer: Self-pay

## 2022-10-29 MED ORDER — CLOPIDOGREL BISULFATE 75 MG PO TABS: 75.0000 mg | ORAL_TABLET | Freq: Every day | ORAL | 0 refills | Status: DC

## 2022-11-02 ENCOUNTER — Ambulatory Visit: Payer: Medicare Other | Admitting: Emergency Medicine

## 2022-11-04 ENCOUNTER — Other Ambulatory Visit: Payer: Self-pay | Admitting: Internal Medicine

## 2022-11-04 NOTE — Telephone Encounter (Signed)
Please schedule F/U appt with Dr. Saunders Revel. Thank you!

## 2022-11-10 ENCOUNTER — Encounter: Payer: Self-pay | Admitting: Emergency Medicine

## 2022-11-10 ENCOUNTER — Other Ambulatory Visit: Payer: Self-pay | Admitting: Emergency Medicine

## 2022-11-10 ENCOUNTER — Ambulatory Visit (INDEPENDENT_AMBULATORY_CARE_PROVIDER_SITE_OTHER): Payer: Medicare Other | Admitting: Emergency Medicine

## 2022-11-10 VITALS — BP 128/72 | HR 58 | Temp 97.6°F | Ht 62.0 in | Wt 203.0 lb

## 2022-11-10 DIAGNOSIS — I495 Sick sinus syndrome: Secondary | ICD-10-CM | POA: Diagnosis not present

## 2022-11-10 DIAGNOSIS — E1159 Type 2 diabetes mellitus with other circulatory complications: Secondary | ICD-10-CM | POA: Diagnosis not present

## 2022-11-10 DIAGNOSIS — I152 Hypertension secondary to endocrine disorders: Secondary | ICD-10-CM | POA: Diagnosis not present

## 2022-11-10 DIAGNOSIS — I5032 Chronic diastolic (congestive) heart failure: Secondary | ICD-10-CM

## 2022-11-10 DIAGNOSIS — E1169 Type 2 diabetes mellitus with other specified complication: Secondary | ICD-10-CM

## 2022-11-10 DIAGNOSIS — R8290 Unspecified abnormal findings in urine: Secondary | ICD-10-CM

## 2022-11-10 DIAGNOSIS — E785 Hyperlipidemia, unspecified: Secondary | ICD-10-CM | POA: Diagnosis not present

## 2022-11-10 DIAGNOSIS — R5383 Other fatigue: Secondary | ICD-10-CM | POA: Diagnosis not present

## 2022-11-10 DIAGNOSIS — E790 Hyperuricemia without signs of inflammatory arthritis and tophaceous disease: Secondary | ICD-10-CM | POA: Diagnosis not present

## 2022-11-10 DIAGNOSIS — I25119 Atherosclerotic heart disease of native coronary artery with unspecified angina pectoris: Secondary | ICD-10-CM

## 2022-11-10 LAB — URINALYSIS, ROUTINE W REFLEX MICROSCOPIC
Bilirubin Urine: NEGATIVE
Hgb urine dipstick: NEGATIVE
Ketones, ur: NEGATIVE
Nitrite: NEGATIVE
RBC / HPF: NONE SEEN (ref 0–?)
Specific Gravity, Urine: 1.01 (ref 1.000–1.030)
Total Protein, Urine: NEGATIVE
Urine Glucose: NEGATIVE
Urobilinogen, UA: 0.2 (ref 0.0–1.0)
pH: 6 (ref 5.0–8.0)

## 2022-11-10 LAB — COMPREHENSIVE METABOLIC PANEL
ALT: 15 U/L (ref 0–35)
AST: 21 U/L (ref 0–37)
Albumin: 4.5 g/dL (ref 3.5–5.2)
Alkaline Phosphatase: 89 U/L (ref 39–117)
BUN: 29 mg/dL — ABNORMAL HIGH (ref 6–23)
CO2: 31 mEq/L (ref 19–32)
Calcium: 8.8 mg/dL (ref 8.4–10.5)
Chloride: 99 mEq/L (ref 96–112)
Creatinine, Ser: 1.31 mg/dL — ABNORMAL HIGH (ref 0.40–1.20)
GFR: 41.84 mL/min — ABNORMAL LOW (ref >60.00)
Glucose, Bld: 132 mg/dL — ABNORMAL HIGH (ref 70–99)
Potassium: 4.1 mEq/L (ref 3.5–5.1)
Sodium: 140 mEq/L (ref 135–145)
Total Bilirubin: 0.5 mg/dL (ref 0.2–1.2)
Total Protein: 7.3 g/dL (ref 6.0–8.3)

## 2022-11-10 LAB — LIPID PANEL
Cholesterol: 113 mg/dL (ref 0–200)
HDL: 38.1 mg/dL — ABNORMAL LOW (ref >39.00)
NonHDL: 74.74
Total CHOL/HDL Ratio: 3
Triglycerides: 305 mg/dL — ABNORMAL HIGH (ref 0.0–149.0)
VLDL: 61 mg/dL — ABNORMAL HIGH (ref 0.0–40.0)

## 2022-11-10 LAB — CBC WITH DIFFERENTIAL/PLATELET
Basophils Absolute: 0 10*3/uL (ref 0.0–0.1)
Basophils Relative: 0.8 % (ref 0.0–3.0)
Eosinophils Absolute: 0.2 10*3/uL (ref 0.0–0.7)
Eosinophils Relative: 4.3 % (ref 0.0–5.0)
HCT: 35.3 % — ABNORMAL LOW (ref 36.0–46.0)
Hemoglobin: 12.5 g/dL (ref 12.0–15.0)
Lymphocytes Relative: 21.5 % (ref 12.0–46.0)
Lymphs Abs: 0.9 10*3/uL (ref 0.7–4.0)
MCHC: 35.5 g/dL (ref 30.0–36.0)
MCV: 94.8 fl (ref 78.0–100.0)
Monocytes Absolute: 0.6 10*3/uL (ref 0.1–1.0)
Monocytes Relative: 13.8 % — ABNORMAL HIGH (ref 3.0–12.0)
Neutro Abs: 2.5 10*3/uL (ref 1.4–7.7)
Neutrophils Relative %: 59.6 % (ref 43.0–77.0)
Platelets: 203 10*3/uL (ref 150.0–400.0)
RBC: 3.73 Mil/uL — ABNORMAL LOW (ref 3.87–5.11)
RDW: 13.1 % (ref 11.5–15.5)
WBC: 4.2 10*3/uL (ref 4.0–10.5)

## 2022-11-10 LAB — LDL CHOLESTEROL, DIRECT: Direct LDL: 32 mg/dL

## 2022-11-10 LAB — URIC ACID: Uric Acid, Serum: 8 mg/dL — ABNORMAL HIGH (ref 2.4–7.0)

## 2022-11-10 LAB — HEMOGLOBIN A1C: Hgb A1c MFr Bld: 6.3 % (ref 4.6–6.5)

## 2022-11-10 LAB — TSH: TSH: 1.32 u[IU]/mL (ref 0.35–5.50)

## 2022-11-10 MED ORDER — ALLOPURINOL 100 MG PO TABS: 100.0000 mg | ORAL_TABLET | Freq: Every day | ORAL | 3 refills | Status: DC

## 2022-11-10 NOTE — Patient Instructions (Signed)
Health Maintenance After Age 69 After age 69, you are at a higher risk for certain long-term diseases and infections as well as injuries from falls. Falls are a major cause of broken bones and head injuries in people who are older than age 69. Getting regular preventive care can help to keep you healthy and well. Preventive care includes getting regular testing and making lifestyle changes as recommended by your health care provider. Talk with your health care provider about: Which screenings and tests you should have. A screening is a test that checks for a disease when you have no symptoms. A diet and exercise plan that is right for you. What should I know about screenings and tests to prevent falls? Screening and testing are the best ways to find a health problem early. Early diagnosis and treatment give you the best chance of managing medical conditions that are common after age 69. Certain conditions and lifestyle choices may make you more likely to have a fall. Your health care provider may recommend: Regular vision checks. Poor vision and conditions such as cataracts can make you more likely to have a fall. If you wear glasses, make sure to get your prescription updated if your vision changes. Medicine review. Work with your health care provider to regularly review all of the medicines you are taking, including over-the-counter medicines. Ask your health care provider about any side effects that may make you more likely to have a fall. Tell your health care provider if any medicines that you take make you feel dizzy or sleepy. Strength and balance checks. Your health care provider may recommend certain tests to check your strength and balance while standing, walking, or changing positions. Foot health exam. Foot pain and numbness, as well as not wearing proper footwear, can make you more likely to have a fall. Screenings, including: Osteoporosis screening. Osteoporosis is a condition that causes  the bones to get weaker and break more easily. Blood pressure screening. Blood pressure changes and medicines to control blood pressure can make you feel dizzy. Depression screening. You may be more likely to have a fall if you have a fear of falling, feel depressed, or feel unable to do activities that you used to do. Alcohol use screening. Using too much alcohol can affect your balance and may make you more likely to have a fall. Follow these instructions at home: Lifestyle Do not drink alcohol if: Your health care provider tells you not to drink. If you drink alcohol: Limit how much you have to: 0-1 drink a day for women. 0-2 drinks a day for men. Know how much alcohol is in your drink. In the [place] one drink equals one 12 oz bottle of beer (355 mL), one 5 oz glass of wine (148 mL), or one 1 oz glass of hard liquor (44 mL). Do not use any products that contain nicotine or tobacco. These products include cigarettes, chewing tobacco, and vaping devices, such as e-cigarettes. If you need help quitting, ask your health care provider. Activity  Follow a regular exercise program to stay fit. This will help you maintain your balance. Ask your health care provider what types of exercise are appropriate for you. If you need a cane or walker, use it as recommended by your health care provider. Wear supportive shoes that have nonskid soles. Safety  Remove any tripping hazards, such as rugs, cords, and clutter. Install safety equipment such as grab bars in bathrooms and safety rails on stairs. Keep rooms and walkways   well-lit. General instructions Talk with your health care provider about your risks for falling. Tell your health care provider if: You fall. Be sure to tell your health care provider about all falls, even ones that seem minor. You feel dizzy, tiredness (fatigue), or off-balance. Take over-the-counter and prescription medicines only as told by your health care provider. These include  supplements. Eat a healthy diet and maintain a healthy weight. A healthy diet includes low-fat dairy products, low-fat (lean) meats, and fiber from whole grains, beans, and lots of fruits and vegetables. Stay current with your vaccines. Schedule regular health, dental, and eye exams. Summary Having a healthy lifestyle and getting preventive care can help to protect your health and wellness after age 69. Screening and testing are the best way to find a health problem early and help you avoid having a fall. Early diagnosis and treatment give you the best chance for managing medical conditions that are more common for people who are older than age 69. Falls are a major cause of broken bones and head injuries in people who are older than age 69. Take precautions to prevent a fall at home. Work with your health care provider to learn what changes you can make to improve your health and wellness and to prevent falls. This information is not intended to replace advice given to you by your health care provider. Make sure you discuss any questions you have with your health care provider. Document Revised: 02/16/2021 Document Reviewed: 02/16/2021 Elsevier Patient Education  2023 Elsevier Inc.  

## 2022-11-10 NOTE — Assessment & Plan Note (Signed)
Diet and nutrition discussed.  Advised to decrease amount of daily carbohydrate intake and daily calories and increase amount of plant-based protein in her diet. 

## 2022-11-10 NOTE — Assessment & Plan Note (Signed)
Multifactorial.  Chronic medical conditions discussed with patient. These are contributing to some extent. Medications reviewed. Differential diagnosis discussed. Need to do blood work today. Mental state of health is good.  Continue Celexa 20 mg daily.

## 2022-11-10 NOTE — Assessment & Plan Note (Signed)
No recent anginal episodes. Continues Plavix 75 mg daily along with beta-blocker and cholesterol medication

## 2022-11-10 NOTE — Assessment & Plan Note (Signed)
Well-controlled hypertension. Continue metoprolol succinate 50 mg daily, lisinopril 40 mg daily, hydrochlorothiazide 25 mg and Lasix 20 mg daily. Well-controlled diabetes.  Continue weekly Trulicity 7.50 mg Cardiovascular risks associated with hypertension and diabetes discussed. Diet and nutrition discussed. Blood work done today.

## 2022-11-10 NOTE — Progress Notes (Signed)
[name] [name] 67 y.o.   Chief Complaint  Patient presents with   Follow-up    24mth f/u appt, patient states she is constantly tired, fatigued,     HISTORY OF PRESENT ILLNESS: This is a 67y.o. female here for 673-monthollow-up of chronic medical conditions. Feels constantly tired and fatigued. No new medication.  No flulike symptoms.  No recent viral infections. Eating well.  No other associated symptoms. Compliant with medications. BP Readings from Last 3 Encounters:  11/10/22 128/72  06/09/22 110/64  05/04/22 110/60   Wt Readings from Last 3 Encounters:  11/10/22 203 lb (92.1 kg)  06/09/22 199 lb 2 oz (90.3 kg)  05/04/22 197 lb (89.4 kg)     HPI   Prior to Admission medications   Medication Sig Start Date End Date Taking? Authorizing Provider  Accu-Chek Softclix Lancets lancets USE AS DIRECTED 05/10/21  Yes Landis Dowdy, MiInes BloomerMD  albuterol (VENTOLIN HFA) 108 (90 Base) MCG/ACT inhaler Inhale 1-2 puffs into the lungs every 6 (six) hours as needed for wheezing or shortness of breath. 04/17/19  Yes Jeiden Daughtridge, MiInes BloomerMD  allopurinol (ZYLOPRIM) 100 MG tablet Take 1 tablet (100 mg total) by mouth daily. 06/10/22  Yes Sa[place] HillMiInes BloomerMD  atorvastatin (LIPITOR) 80 MG tablet TAKE 1 TABLET BY MOUTH ONCE  DAILY AT 6 PM 10/09/22  Yes Marjo Grosvenor, MiInes BloomerMD  Blood Glucose Monitoring Suppl (ACCU-CHEK GUIDE) w/Device KIT 1 Device by Does not apply route in the morning, at noon, and at bedtime. 05/24/22  Yes Ahaan Zobrist, MiInes BloomerMD  cetirizine (ZYRTEC) 10 MG tablet Take 10 mg by mouth daily.   Yes [provider]  citalopram (CELEXA) 20 MG tablet TAKE 1 TABLET BY MOUTH  DAILY 12/20/21  Yes Damond Borchers, MiInes BloomerMD  clopidogrel (PLAVIX) 75 MG tablet Take 1 tablet (75 mg total) by mouth daily. Please call [phone] schedule a yearly follow up. Thank you. 10/29/22  Yes End, ChHarrell GaveMD  dicyclomine (BENTYL) 20 MG tablet Take 20 mg by mouth 3 (three) times  daily. 07/11/21  Yes [provider]  Dulaglutide (TRULICITY) 0.6.01GUX/3.2TFOPN INJECT 0.'75MG'$  (0.5ML) UNDER THE SKIN ONCE A WEEK. 08/10/22  Yes Kennede Lusk, MiInes BloomerMD  fluticasone ([facility] MCG/ACT nasal spray USE 2 SPRAYS IN BOTH NOSTRILS  DAILY 04/29/22  Yes Haleem Hanner, MiInes BloomerMD  fluticasone (FLOVENT HFA) 110 MCG/ACT inhaler Inhale 2 puffs into the lungs 2 (two) times daily as needed.   Yes [provider]  furosemide (LASIX) 20 MG tablet TAKE 1 TABLET BY MOUTH  DAILY AS NEEDED FOR EDEMA  SWELLING 12/21/21  Yes End, ChHarrell GaveMD  gabapentin (NEURONTIN) 300 MG capsule TAKE 2 CAPSULES BY MOUTH IN THE MORNING AND 3 CAPSULES  AT BEDTIME 04/29/22  Yes Zackory Pudlo, MiInes BloomerMD  glucose blood test strip Use as instructed 06/01/22  Yes Willona Phariss, MiInes BloomerMD  hydrochlorothiazide (HYDRODIURIL) 25 MG tablet Take 1 tablet (25 mg total) by mouth daily. Please schedule follow-up appointment for further refills. Thank you! 11/04/22  Yes End, ChHarrell GaveMD  hydrocortisone cream 1 % Apply 1 application topically 2 (two) times daily.   Yes [provider]  lisinopril (ZESTRIL) 40 MG tablet TAKE 1 TABLET BY MOUTH DAILY 10/09/22  Yes Tani Virgo, MiInes BloomerMD  metoprolol succinate (TOPROL-XL) 50 MG 24 hr tablet TAKE 1 TABLET BY MOUTH  TWICE DAILY WITH OR  IMMEDIATELY FOLLOWING A  MEAL 05/09/22  Yes Dondi Aime, MiInes BloomerMD  Multiple Vitamin (MULTIVITAMIN)  tablet Take 1 tablet by mouth daily. 08/30/16  Yes Lars Pinks M, PA-C  nystatin cream (MYCOSTATIN) Apply 1 application topically as needed for dry skin. 04/28/20  Yes Waynesha Rammel, Ines Bloomer, MD  omeprazole (PRILOSEC) 20 MG capsule Take 1 capsule (20 mg total) by mouth daily. 12/15/16  Yes English, Colletta [place] D, PA  ondansetron (ZOFRAN-ODT) 8 MG disintegrating tablet DISSOLVE 1 TABLET ON TOP OF THE  TONGUE EVERY 12 HOURS AS NEEDED  FOR NAUSEA 10/10/22  Yes Marranda Arakelian, Ines Bloomer, MD  polycarbophil (FIBERCON) 625 MG tablet  Take by mouth daily.   Yes [provider]  PRALUENT 75 MG/ML SOAJ INJECT 75 MG SUBCUTANEOUSLY EVERY 14 DAYS 07/27/22  Yes End, Harrell Gave, MD  traMADol (ULTRAM) 50 MG tablet Take 1 tablet (50 mg total) by mouth at bedtime as needed. 04/29/22  Yes Lashonna Rieke, Ines Bloomer, MD  triamcinolone cream (KENALOG) 0.1 % Apply 1 Application topically daily as needed. 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 ([place]) 06/09/2022   Chronic left shoulder pain 10/28/2021   Hyperlipidemia associated with type 2 diabetes mellitus ([place]) 12/10/2020   Trigger finger, right middle finger 11/07/2019   Hypertension associated with diabetes ([place]) 10/29/2019   Chronic heart failure with preserved ejection fraction (HFpEF) ([place]) 05/25/2019   Coronary artery disease involving native coronary artery of native heart with angina pectoris ([place]) 02/14/2018   Morbid obesity ([place]) 02/14/2018   Irritable bowel syndrome 06/28/2017   S/P CABG x 3 07/22/2016   Multiple vessel coronary artery disease 07/21/2016   Environmental allergies 04/26/2016   Sinus node dysfunction (HCC) 04/26/2016   Pacemaker Medtronic    OSA (obstructive sleep apnea) 05/25/2011   Hx of adenomatous colonic polyps 05/25/2011   Migraine 05/25/2011   Diabetes mellitus type 2, uncontrolled, with complications 64/33/2951   GERD (gastroesophageal reflux disease) 05/25/2011   NAFLD (nonalcoholic fatty liver disease) 05/25/2011   Hyperlipidemia LDL goal <70 05/25/2011   Essential hypertension 88/41/6606   DIASTOLIC HEART FAILURE, CHRONIC 09/16/2009    Past  Medical History:  Diagnosis Date   Allergy    Anal fissure    Anxiety    Arthritis    Asthma    Cataract 2010    left   Colon polyps    diverticulosis  03-2011/2005   Depression    Diabetes mellitus 2012   T2DM   Diverticulosis    Fatty liver 04/2011   GERD (gastroesophageal reflux disease)    Hyperlipidemia    Hypertension severe    Migraine    Obesity    OSA (obstructive sleep apnea)    C-Pap   Pacemaker Medtronic    Sinoatrial node dysfunction ([place])    syncope    Past Surgical History:  Procedure Laterality Date   ABDOMINAL HYSTERECTOMY  2000   total   CARDIAC CATHETERIZATION N/A 07/20/2016   Procedure: Left Heart Cath and Coronary Angiography;  Surgeon: Peter M Martinique, MD;  Location: [facility];  Service: Cardiovascular;  Laterality: N/A;   CATARACT EXTRACTION Left  CORONARY ARTERY BYPASS GRAFT N/A 07/22/2016   Procedure: CORONARY ARTERY BYPASS GRAFTING (CABG) x three , using left internal mammary artery and right leg greater saphenous vein harvested endoscopically;  Surgeon: Ivin Poot, MD;  Location: [facility];  Service: Open Heart Surgery;  Laterality: N/A;   EYE SURGERY Right 2010   MULTIPLE TOOTH EXTRACTIONS     OOPHORECTOMY     PACEMAKER PLACEMENT     T/A right knee     TEE WITHOUT CARDIOVERSION N/A 07/22/2016   Procedure: TRANSESOPHAGEAL ECHOCARDIOGRAM (TEE);  Surgeon: Ivin Poot, MD;  Location: [facility];  Service: Open Heart Surgery;  Laterality: N/A;   TONSILLECTOMY      Social History   Socioeconomic History   Marital status: Single    Spouse name: Not on file   Number of children: 0   Years of education: Not on file   Highest education level: Not on file  Occupational History   Occupation: chef    Employer: [employer]  Tobacco Use   Smoking status: Former    Types: Cigarettes    Quit date: 1995    Years since quitting: 29.1   Smokeless tobacco: Never   Tobacco comments:    Quit 20 yrs ago  Vaping Use   Vaping  Use: Never used  Substance and Sexual Activity   Alcohol use: No    Alcohol/week: 0.0 standard drinks of alcohol   Drug use: No   Sexual activity: Yes    Birth control/protection: Post-menopausal, Other-see comments    Comment: Monogamous   Other Topics Concern   Not on file  Social History Narrative   Single. Exercise: No. Education: College.   Social Determinants of Health   Financial Resource Strain: Low Risk  (01/11/2022)   Overall Financial Resource Strain (CARDIA)    Difficulty of Paying Living Expenses: Not hard at all  Food Insecurity: No Food Insecurity (01/11/2022)   Hunger Vital Sign    Worried About Running Out of Food in the Last Year: Never true    Ran Out of Food in the Last Year: Never true  Transportation Needs: No Transportation Needs (01/11/2022)   PRAPARE - Hydrologist (Medical): No    Lack of Transportation (Non-Medical): No  Physical Activity: Sufficiently Active (01/11/2022)   Exercise Vital Sign    Days of Exercise per Week: 4 days    Minutes of Exercise per Session: 40 min  Stress: No Stress Concern Present (01/11/2022)   [place]    Feeling of Stress : Not at all  Social Connections: Socially Isolated (01/11/2022)   Social Connection and Isolation Panel [NHANES]    Frequency of Communication with Friends and Family: Twice a week    Frequency of Social Gatherings with Friends and Family: Twice a week    Attends Religious Services: Never    Marine scientist or Organizations: No    Attends Archivist Meetings: Never    Marital Status: Never married  Intimate Partner Violence: Not At Risk (01/11/2022)   Humiliation, Afraid, Rape, and Kick questionnaire    Fear of Current or Ex-Partner: No    Emotionally Abused: No    Physically Abused: No    Sexually Abused: No    Family History  Problem Relation Age of Onset   Heart disease Mother    Cancer  Mother 47       lung   Breast  cancer Mother 51   Heart disease Father    Heart attack Sister    Diabetes Sister        type 2   Atrial fibrillation Sister    Sleep apnea Sister    Heart attack Brother    Heart attack Brother    Cancer Maternal Grandmother        breast   Breast cancer Maternal Grandmother    Leukemia Paternal Grandmother        neoplast   Aneurysm Paternal Grandfather        abdominal (stomach)   Colon cancer Neg Hx      Review of Systems  Constitutional:  Positive for malaise/fatigue. Negative for chills, fever and weight loss.  HENT: Negative.  Negative for congestion and sore throat.   Respiratory: Negative.  Negative for cough and shortness of breath.   Cardiovascular: Negative.  Negative for chest pain and palpitations.  Gastrointestinal:  Negative for abdominal pain, diarrhea, nausea and vomiting.  Genitourinary: Negative.  Negative for dysuria and hematuria.  Skin: Negative.  Negative for rash.  Neurological: Negative.  Negative for dizziness and headaches.  All other systems reviewed and are negative.   Today's Vitals   11/10/22 0933  BP: 128/72  Pulse: (!) 58  Temp: 97.6 F (36.4 C)  TempSrc: Oral  SpO2: 97%  Weight: 203 lb (92.1 kg)  Height: '5\' 2"'$  (1.575 m)   Body mass index is 37.13 kg/m.  Physical Exam Vitals reviewed.  Constitutional:      Appearance: Normal appearance.  HENT:     Head: Normocephalic.     Mouth/Throat:     Mouth: Mucous membranes are moist.     Pharynx: Oropharynx is clear.  Eyes:     Extraocular Movements: Extraocular movements intact.     Conjunctiva/sclera: Conjunctivae normal.     Pupils: Pupils are equal, round, and reactive to light.  Cardiovascular:     Rate and Rhythm: Normal rate and regular rhythm.     Pulses: Normal pulses.     Heart sounds: Normal heart sounds.  Pulmonary:     Effort: Pulmonary effort is normal.     Breath sounds: Normal breath sounds.  Abdominal:     Palpations: Abdomen is  soft.     Tenderness: There is no abdominal tenderness.  Musculoskeletal:     Cervical back: No tenderness.  Lymphadenopathy:     Cervical: No cervical adenopathy.  Skin:    General: Skin is warm and dry.  Neurological:     General: No focal deficit present.     Mental Status: She is alert and oriented to person, place, and time.  Psychiatric:        Mood and Affect: Mood normal.        Behavior: Behavior normal.      ASSESSMENT & PLAN: A total of 48 minutes was spent with the patient and counseling/coordination of care regarding preparing for this visit, review of most recent office visit notes, review of multiple chronic medical conditions and their management, review of all medications, education on nutrition, differential diagnosis of tiredness, need for blood work today, prognosis, documentation and need for follow-up.  Problem List Items Addressed This Visit       Cardiovascular and Mediastinum   Sinus node dysfunction ([place])    Permanent pacemaker working well.      Coronary artery disease involving native coronary artery of native heart with angina pectoris ([place])    No recent anginal episodes. Continues  Plavix 75 mg daily along with beta-blocker and cholesterol medication      Chronic heart failure with preserved ejection fraction (HFpEF) (HCC)    No clinical signs of acute congestive heart failure. Stable.  Clinically euvolemic. Continue metoprolol succinate 50 mg daily, lisinopril 40 mg daily, hydrochlorothiazide 25 mg daily and furosemide 20 mg daily      Hypertension associated with diabetes ([place])    Well-controlled hypertension. Continue metoprolol succinate 50 mg daily, lisinopril 40 mg daily, hydrochlorothiazide 25 mg and Lasix 20 mg daily. Well-controlled diabetes.  Continue weekly Trulicity 1.61 mg Cardiovascular risks associated with hypertension and diabetes discussed. Diet and nutrition discussed. Blood work done today.         Endocrine    Hyperlipidemia associated with type 2 diabetes mellitus (HCC)    Stable.  Diet and nutrition discussed. Continue atorvastatin 80 mg daily.        Other   Morbid obesity ([place])    Diet and nutrition discussed. Advised to decrease amount of daily carbohydrate intake and daily calories and increase amount of plant-based protein in her diet      Hyperuricemia    Continues allopurinol 100 mg daily. Joint pains much improved.  Taking less of gabapentin as a result. Repeat uric acid level done today.      Relevant Medications   allopurinol (ZYLOPRIM) 100 MG tablet   Tiredness - Primary    Multifactorial.  Chronic medical conditions discussed with patient. These are contributing to some extent. Medications reviewed. Differential diagnosis discussed. Need to do blood work today. Mental state of health is good.  Continue Celexa 20 mg daily.      Relevant Orders   Urinalysis   CBC with Differential/Platelet   Comprehensive metabolic panel   Hemoglobin A1c   Lipid panel   TSH   Uric acid   Patient Instructions  Health Maintenance After Age 72 After age 72, you are at a higher risk for certain long-term diseases and infections as well as injuries from falls. Falls are a major cause of broken bones and head injuries in people who are older than age 36. Getting regular preventive care can help to keep you healthy and well. Preventive care includes getting regular testing and making lifestyle changes as recommended by your health care provider. Talk with your health care provider about: Which screenings and tests you should have. A screening is a test that checks for a disease when you have no symptoms. A diet and exercise plan that is right for you. What should I know about screenings and tests to prevent falls? Screening and testing are the best ways to find a health problem early. Early diagnosis and treatment give you the best chance of managing medical conditions that are common after  age 69. Certain conditions and lifestyle choices may make you more likely to have a fall. Your health care provider may recommend: Regular vision checks. Poor vision and conditions such as cataracts can make you more likely to have a fall. If you wear glasses, make sure to get your prescription updated if your vision changes. Medicine review. Work with your health care provider to regularly review all of the medicines you are taking, including over-the-counter medicines. Ask your health care provider about any side effects that may make you more likely to have a fall. Tell your health care provider if any medicines that you take make you feel dizzy or sleepy. Strength and balance checks. Your health care provider may recommend certain tests  to check your strength and balance while standing, walking, or changing positions. Foot health exam. Foot pain and numbness, as well as not wearing proper footwear, can make you more likely to have a fall. Screenings, including: Osteoporosis screening. Osteoporosis is a condition that causes the bones to get weaker and break more easily. Blood pressure screening. Blood pressure changes and medicines to control blood pressure can make you feel dizzy. Depression screening. You may be more likely to have a fall if you have a fear of falling, feel depressed, or feel unable to do activities that you used to do. Alcohol use screening. Using too much alcohol can affect your balance and may make you more likely to have a fall. Follow these instructions at home: Lifestyle Do not drink alcohol if: Your health care provider tells you not to drink. If you drink alcohol: Limit how much you have to: 0-1 drink a day for women. 0-2 drinks a day for men. Know how much alcohol is in your drink. In the [place] one drink equals one 12 oz bottle of beer (355 mL), one 5 oz glass of wine (148 mL), or one 1 oz glass of hard liquor (44 mL). Do not use any products that contain nicotine  or tobacco. These products include cigarettes, chewing tobacco, and vaping devices, such as e-cigarettes. If you need help quitting, ask your health care provider. Activity  Follow a regular exercise program to stay fit. This will help you maintain your balance. Ask your health care provider what types of exercise are appropriate for you. If you need a cane or walker, use it as recommended by your health care provider. Wear supportive shoes that have nonskid soles. Safety  Remove any tripping hazards, such as rugs, cords, and clutter. Install safety equipment such as grab bars in bathrooms and safety rails on stairs. Keep rooms and walkways well-lit. General instructions Talk with your health care provider about your risks for falling. Tell your health care provider if: You fall. Be sure to tell your health care provider about all falls, even ones that seem minor. You feel dizzy, tiredness (fatigue), or off-balance. Take over-the-counter and prescription medicines only as told by your health care provider. These include supplements. Eat a healthy diet and maintain a healthy weight. A healthy diet includes low-fat dairy products, low-fat (lean) meats, and fiber from whole grains, beans, and lots of fruits and vegetables. Stay current with your vaccines. Schedule regular health, dental, and eye exams. Summary Having a healthy lifestyle and getting preventive care can help to protect your health and wellness after age 77. Screening and testing are the best way to find a health problem early and help you avoid having a fall. Early diagnosis and treatment give you the best chance for managing medical conditions that are more common for people who are older than age 69. Falls are a major cause of broken bones and head injuries in people who are older than age 87. Take precautions to prevent a fall at home. Work with your health care provider to learn what changes you can make to improve your health  and wellness and to prevent falls. This information is not intended to replace advice given to you by your health care provider. Make sure you discuss any questions you have with your health care provider. Document Revised: 02/16/2021 Document Reviewed: 02/16/2021 Elsevier Patient Education  [place], MD [facility]

## 2022-11-10 NOTE — Assessment & Plan Note (Signed)
Continues allopurinol 100 mg daily. Joint pains much improved.  Taking less of gabapentin as a result. Repeat uric acid level done today.

## 2022-11-10 NOTE — Assessment & Plan Note (Signed)
Permanent pacemaker working well.

## 2022-11-10 NOTE — Assessment & Plan Note (Signed)
No clinical signs of acute congestive heart failure. Stable.  Clinically euvolemic. Continue metoprolol succinate 50 mg daily, lisinopril 40 mg daily, hydrochlorothiazide 25 mg daily and furosemide 20 mg daily

## 2022-11-10 NOTE — Assessment & Plan Note (Signed)
Stable.  Diet and nutrition discussed. Continue atorvastatin 80 mg daily.

## 2022-11-11 ENCOUNTER — Telehealth: Payer: Self-pay

## 2022-11-20 ENCOUNTER — Other Ambulatory Visit: Payer: Self-pay | Admitting: Emergency Medicine

## 2022-11-20 DIAGNOSIS — F419 Anxiety disorder, unspecified: Secondary | ICD-10-CM

## 2022-12-10 ENCOUNTER — Ambulatory Visit (INDEPENDENT_AMBULATORY_CARE_PROVIDER_SITE_OTHER): Payer: Medicare Other

## 2022-12-10 DIAGNOSIS — I495 Sick sinus syndrome: Secondary | ICD-10-CM

## 2022-12-15 LAB — CUP PACEART REMOTE DEVICE CHECK
Battery Impedance: 2111 Ohm
Battery Remaining Longevity: 41 mo
Battery Voltage: 2.75 V
Brady Statistic AP VP Percent: 0 %
Brady Statistic AP VS Percent: 0 %
Brady Statistic AS VP Percent: 1 %
Brady Statistic AS VS Percent: 99 %
Date Time Interrogation Session: 20240305192725
Implantable Lead Connection Status: 753985
Implantable Lead Connection Status: 753985
Implantable Lead Implant Date: 20091201
Implantable Lead Implant Date: 20091201
Implantable Lead Location: 753859
Implantable Lead Location: 753860
Implantable Lead Model: 5076
Implantable Lead Model: 5076
Implantable Pulse Generator Implant Date: 20091201
Lead Channel Impedance Value: 394 Ohm
Lead Channel Impedance Value: 555 Ohm
Lead Channel Pacing Threshold Amplitude: 0.5 V
Lead Channel Pacing Threshold Amplitude: 0.875 V
Lead Channel Pacing Threshold Pulse Width: 0.4 ms
Lead Channel Pacing Threshold Pulse Width: 0.4 ms
Lead Channel Setting Pacing Amplitude: 2 V
Lead Channel Setting Pacing Amplitude: 2.5 V
Lead Channel Setting Pacing Pulse Width: 0.4 ms
Lead Channel Setting Sensing Sensitivity: 2 mV
Zone Setting Status: 755011
Zone Setting Status: 755011

## 2022-12-23 ENCOUNTER — Other Ambulatory Visit: Payer: Self-pay | Admitting: Internal Medicine

## 2022-12-26 ENCOUNTER — Other Ambulatory Visit: Payer: Self-pay | Admitting: Emergency Medicine

## 2022-12-26 DIAGNOSIS — G629 Polyneuropathy, unspecified: Secondary | ICD-10-CM

## 2022-12-27 ENCOUNTER — Encounter: Payer: Self-pay | Admitting: Medical

## 2022-12-27 ENCOUNTER — Ambulatory Visit: Payer: Medicare Other | Attending: Medical | Admitting: Medical

## 2022-12-27 VITALS — BP 134/85 | HR 60 | Ht 62.0 in | Wt 201.8 lb

## 2022-12-27 DIAGNOSIS — Z950 Presence of cardiac pacemaker: Secondary | ICD-10-CM

## 2022-12-27 DIAGNOSIS — I2510 Atherosclerotic heart disease of native coronary artery without angina pectoris: Secondary | ICD-10-CM | POA: Diagnosis not present

## 2022-12-27 DIAGNOSIS — I495 Sick sinus syndrome: Secondary | ICD-10-CM

## 2022-12-27 DIAGNOSIS — I5032 Chronic diastolic (congestive) heart failure: Secondary | ICD-10-CM

## 2022-12-27 DIAGNOSIS — I10 Essential (primary) hypertension: Secondary | ICD-10-CM | POA: Diagnosis not present

## 2022-12-27 NOTE — Progress Notes (Signed)
Cardiology Office Note:    Date:  12/27/2022   ID:  [name] [name], DOB 08/01/1956, MRN [id number]  PCP:  Horald Pollen, MD  [facility] Cardiologist:  Nelva Bush, MD  [place] Electrophysiologist:  None   Referring MD: Horald Pollen, *   Chief Complaint: 1 year follow-up  History of Present Illness:    [name] [name] is a 67 y.o. female with a hx of CAD status post CABG in 2017 with postop A-fib, chronic HFpEF, hypertension, hyperlipidemia sinus node dysfunction status post permanent pacemaker, sleep apnea on CPAP who presents for follow-up of CAD and HFpEF.  Patient underwent CABG x 3 2017.  Subsequent myocardial perfusion stress test in September 2022 was low risk without evidence of ischemia or scar.  Follows with EP for pacemaker management.  Patient was last seen March 2023 and was overall feeling well.  She had lost about 10 pounds with diet changes.  Today, the patient is overall doing well. She is having some trouble with cold induced asthma. The patient denies chest pain or sob. She is walking at least 5 miles a week. No lower leg edema, orthopnea, or pnd. She uses a cPAP machine at night. She reports rare palpitations with severe exertion. She drinks a lot of diet coke, which she was counseled to decrease.  Past Medical History:  Diagnosis Date   Allergy    Anal fissure    Anxiety    Arthritis    Asthma    Cataract 2010    left   Colon polyps    diverticulosis  03-2011/2005   Depression    Diabetes mellitus 2012   T2DM   Diverticulosis    Fatty liver 04/2011   GERD (gastroesophageal reflux disease)    Hyperlipidemia    Hypertension severe    Migraine    Obesity    OSA (obstructive sleep apnea)    C-Pap   Pacemaker Medtronic    Sinoatrial node dysfunction ([place])    syncope    Past Surgical History:  Procedure Laterality Date   ABDOMINAL HYSTERECTOMY  2000   total   CARDIAC CATHETERIZATION N/A 07/20/2016   Procedure: Left Heart  Cath and Coronary Angiography;  Surgeon: Peter M Martinique, MD;  Location: [place] CV LAB;  Service: Cardiovascular;  Laterality: N/A;   CATARACT EXTRACTION Left    CORONARY ARTERY BYPASS GRAFT N/A 07/22/2016   Procedure: CORONARY ARTERY BYPASS GRAFTING (CABG) x three , using left internal mammary artery and right leg greater saphenous vein harvested endoscopically;  Surgeon: Ivin Poot, MD;  Location: [place];  Service: Open Heart Surgery;  Laterality: N/A;   EYE SURGERY Right 2010   MULTIPLE TOOTH EXTRACTIONS     OOPHORECTOMY     PACEMAKER PLACEMENT     T/A right knee     TEE WITHOUT CARDIOVERSION N/A 07/22/2016   Procedure: TRANSESOPHAGEAL ECHOCARDIOGRAM (TEE);  Surgeon: Ivin Poot, MD;  Location: [place];  Service: Open Heart Surgery;  Laterality: N/A;   TONSILLECTOMY      Current Medications: Current Meds  Medication Sig   Accu-Chek Softclix Lancets lancets USE AS DIRECTED   albuterol (VENTOLIN HFA) 108 (90 Base) MCG/ACT inhaler Inhale 1-2 puffs into the lungs every 6 (six) hours as needed for wheezing or shortness of breath.   allopurinol (ZYLOPRIM) 100 MG tablet Take 1 tablet (100 mg total) by mouth daily.   atorvastatin (LIPITOR) 80 MG tablet TAKE 1 TABLET BY MOUTH ONCE  DAILY AT 6 PM  Blood Glucose Monitoring Suppl (ACCU-CHEK GUIDE) w/Device KIT 1 Device by Does not apply route in the morning, at noon, and at bedtime.   cetirizine (ZYRTEC) 10 MG tablet Take 10 mg by mouth daily.   citalopram (CELEXA) 20 MG tablet TAKE 1 TABLET BY MOUTH DAILY   clopidogrel (PLAVIX) 75 MG tablet TAKE 1 TABLET BY MOUTH DAILY   dicyclomine (BENTYL) 20 MG tablet Take 20 mg by mouth 3 (three) times daily.   Dulaglutide (TRULICITY) A999333 0000000 SOPN INJECT 0.75MG  (0.5ML) UNDER THE SKIN ONCE A WEEK.   fluticasone (FLONASE) 50 MCG/ACT nasal spray USE 2 SPRAYS IN BOTH NOSTRILS  DAILY   fluticasone (FLOVENT HFA) 110 MCG/ACT inhaler Inhale 2 puffs into the lungs 2 (two) times daily as needed.    furosemide (LASIX) 20 MG tablet TAKE 1 TABLET BY MOUTH  DAILY AS NEEDED FOR EDEMA  SWELLING (Patient taking differently: Take 20 mg by mouth daily.)   gabapentin (NEURONTIN) 300 MG capsule TAKE 2 CAPSULES BY MOUTH IN THE MORNING AND 3 CAPSULES  AT BEDTIME   glucose blood test strip Use as instructed   hydrochlorothiazide (HYDRODIURIL) 25 MG tablet Take 1 tablet (25 mg total) by mouth daily. Please schedule follow-up appointment for further refills. Thank you!   hydrocortisone cream 1 % Apply 1 application topically 2 (two) times daily.   lisinopril (ZESTRIL) 40 MG tablet TAKE 1 TABLET BY MOUTH DAILY   loratadine (CLARITIN) 10 MG tablet Take 10 mg by mouth daily.   metoprolol succinate (TOPROL-XL) 50 MG 24 hr tablet TAKE 1 TABLET BY MOUTH  TWICE DAILY WITH OR  IMMEDIATELY FOLLOWING A  MEAL   Multiple Vitamin (MULTIVITAMIN) tablet Take 1 tablet by mouth daily.   nystatin cream (MYCOSTATIN) Apply 1 application topically as needed for dry skin.   omeprazole (PRILOSEC) 20 MG capsule Take 1 capsule (20 mg total) by mouth daily.   ondansetron (ZOFRAN-ODT) 8 MG disintegrating tablet DISSOLVE 1 TABLET ON TOP OF THE  TONGUE EVERY 12 HOURS AS NEEDED  FOR NAUSEA   polycarbophil (FIBERCON) 625 MG tablet Take by mouth daily.   PRALUENT 75 MG/ML SOAJ INJECT 75 MG SUBCUTANEOUSLY EVERY 14 DAYS   traMADol (ULTRAM) 50 MG tablet TAKE 1 TABLET BY MOUTH AT  BEDTIME AS NEEDED     Allergies:   Aspirin, Adhesive [tape], Penicillins, Red yeast rice, and Sulfa drugs cross reactors   Social History   Socioeconomic History   Marital status: Single    Spouse name: Not on file   Number of children: 0   Years of education: Not on file   Highest education level: Not on file  Occupational History   Occupation: chef    Employer: [employer]  Tobacco Use   Smoking status: Former    Types: Cigarettes    Quit date: 1995    Years since quitting: 29.2   Smokeless tobacco: Never   Tobacco comments:    Quit  20 yrs ago  Vaping Use   Vaping Use: Never used  Substance and Sexual Activity   Alcohol use: No    Alcohol/week: 0.0 standard drinks of alcohol   Drug use: No   Sexual activity: Yes    Birth control/protection: Post-menopausal, Other-see comments    Comment: Monogamous   Other Topics Concern   Not on file  Social History Narrative   Single. Exercise: No. Education: College.   Social Determinants of Health   Financial Resource Strain: Low Risk  (01/11/2022)   Overall Financial  Resource Strain (CARDIA)    Difficulty of Paying Living Expenses: Not hard at all  Food Insecurity: No Food Insecurity (01/11/2022)   Hunger Vital Sign    Worried About Running Out of Food in the Last Year: Never true    Ran Out of Food in the Last Year: Never true  Transportation Needs: No Transportation Needs (01/11/2022)   PRAPARE - Hydrologist (Medical): No    Lack of Transportation (Non-Medical): No  Physical Activity: Sufficiently Active (01/11/2022)   Exercise Vital Sign    Days of Exercise per Week: 4 days    Minutes of Exercise per Session: 40 min  Stress: No Stress Concern Present (01/11/2022)   [place]    Feeling of Stress : Not at all  Social Connections: Socially Isolated (01/11/2022)   Social Connection and Isolation Panel [NHANES]    Frequency of Communication with Friends and Family: Twice a week    Frequency of Social Gatherings with Friends and Family: Twice a week    Attends Religious Services: Never    Marine scientist or Organizations: No    Attends Music therapist: Never    Marital Status: Never married     Family History: The patient's family history includes Aneurysm in her paternal grandfather; Atrial fibrillation in her sister; Breast cancer in her maternal grandmother; Breast cancer (age of onset: 32) in her mother; Cancer in her maternal grandmother; Cancer (age of  onset: 19) in her mother; Diabetes in her sister; Heart attack in her brother, brother, and sister; Heart disease in her father and mother; Leukemia in her paternal grandmother; Sleep apnea in her sister. There is no history of Colon cancer.  ROS:   Please see the history of present illness.     All other systems reviewed and are negative.  EKGs/Labs/Other Studies Reviewed:    The following studies were reviewed today:  Myoview lexiscan 06/2021   The study is normal. The study is low risk.   No ST deviation was noted.   LV perfusion is normal. There is no evidence of ischemia. There is no evidence of infarction.   Left ventricular function is normal. End diastolic cavity size is normal. End systolic cavity size is normal.   suboptimal study due to significant extracardiac activity   EKG:  EKG is ordered today.  The ekg ordered today demonstrates NSR, 60bpm, RBBB, nonspecific T wave changes  Recent Labs: 11/10/2022: ALT 15; BUN 29; Creatinine, Ser 1.31; Hemoglobin 12.5; Platelets 203.0; Potassium 4.1; Sodium 140; TSH 1.32  Recent Lipid Panel    Component Value Date/Time   CHOL 113 11/10/2022 1014   CHOL 138 12/16/2021 1048   TRIG 305.0 (H) 11/10/2022 1014   HDL 38.10 (L) 11/10/2022 1014   HDL 40 12/16/2021 1048   CHOLHDL 3 11/10/2022 1014   VLDL 61.0 (H) 11/10/2022 1014   LDLCALC 60 12/16/2021 1048   LDLDIRECT 32.0 11/10/2022 1014    Physical Exam:    VS:  BP 134/85 (BP Location: Left Arm, Patient Position: Sitting, Cuff Size: Large)   Pulse 60   Ht 5\' 2"  (1.575 m)   Wt 201 lb 12.8 oz (91.5 kg)   SpO2 96%   BMI 36.91 kg/m     Wt Readings from Last 3 Encounters:  12/27/22 201 lb 12.8 oz (91.5 kg)  11/10/22 203 lb (92.1 kg)  06/09/22 199 lb 2 oz (90.3 kg)  GEN:  Well nourished, well developed in no acute distress HEENT: Normal NECK: No JVD; No carotid bruits LYMPHATICS: No lymphadenopathy CARDIAC: RRR, no murmurs, rubs, gallops RESPIRATORY:  Clear to  auscultation without rales, wheezing or rhonchi  ABDOMEN: Soft, non-tender, non-distended MUSCULOSKELETAL:  No edema; No deformity  SKIN: Warm and dry NEUROLOGIC:  Alert and oriented x 3 PSYCHIATRIC:  Normal affect   ASSESSMENT:    1. Coronary artery disease involving native coronary artery of native heart without angina pectoris   2. Chronic diastolic heart failure (HCC)   3. Cardiac pacemaker in situ   4. Sinus node dysfunction (HCC)   5. Essential hypertension    PLAN:    In order of problems listed above:  CAD s/p CABG in 2017 No angina reported. Continue Praluent, Lipitor, Plavix, and Toprol 50 mg twice daily.  She is walking 5 miles a week. Diet changes encouraged. No further ischemic workup indicated at this time.  HFpEF Patient is fairly euvolemic on exam today. Most recent Echo in 2017 showed LVEF 60-65%, G1DD. Patient is taking Lasix 20 mg daily along with hydrochlorothiazide 25 mg daily.  Most recent blood work showed mild dehydration. I will stop hydrochlorothiazide. BMET in 2 weeks. Continue Toprol and lisinopril.  Hypertension Blood pressure fairly well-controlled today. Stopping hydrochlorothiazide as above in the setting of daily lasix use.  Continue lisinopril 40 mg daily and Toprol 50 mg twice daily. I recommended patient check blood pressures for the next 2 weeks and let us know if they are high.  Hyperlipidemia Recent LDL 32.  Continue Praluent and Lipitor 80 mg daily.    Sinus node dysfunction s/p PPM Normally device functioning on most recent check. Patient follows with EP.   Disposition: Follow up in 1 year(s) with MD/APP    Signed, Brisha Mccabe Ninfa Meeker, PA-C  12/27/2022 9:48 AM    [place]

## 2022-12-27 NOTE — Patient Instructions (Signed)
Medication Instructions:   Your physician has recommended you make the following change in your medication:   STOP Hydrochlorothiazide.  *If you need a refill on your cardiac medications before your next appointment, please call your pharmacy*   Lab Work:  Your physician recommends that you return for lab work in: 2 weeks at [facility].  BMP  Monday-Friday  7:00 am-6:00 pm  If you have labs (blood work) drawn today and your tests are completely normal, you will receive your results only by: [place] (if you have MyChart) OR A paper copy in the mail If you have any lab test that is abnormal or we need to change your treatment, we will call you to review the results.   Testing/Procedures:  NONE   Follow-Up: At [facility], you and your health needs are our priority.  As part of our continuing mission to provide you with exceptional heart care, we have created designated Provider Care Teams.  These Care Teams include your primary Cardiologist (physician) and Advanced Practice Providers (APPs -  Physician Assistants and Nurse Practitioners) who all work together to provide you with the care you need, when you need it.  We recommend signing up for the patient portal called "MyChart".  Sign up information is provided on this After Visit Summary.  MyChart is used to connect with patients for Virtual Visits (Telemedicine).  Patients are able to view lab/test results, encounter notes, upcoming appointments, etc.  Non-urgent messages can be sent to your provider as well.   To learn more about what you can do with MyChart, go to [email or web address].    Your next appointment:    6 month(s) with Dr. Caryl Comes 12 months with Dr. Saunders Revel

## 2022-12-31 NOTE — Progress Notes (Signed)
Remote pacemaker transmission.   

## 2023-01-03 ENCOUNTER — Encounter: Payer: Self-pay | Admitting: *Deleted

## 2023-01-03 NOTE — Progress Notes (Unsigned)
PATIENT: [name] [name] DOB: Jun 13, 1956  REASON FOR VISIT: follow up HISTORY FROM: patient PRIMARY NEUROLOGIST: Dr. Frances Furbish  HISTORY OF PRESENT ILLNESS: Today 01/04/23:  [name] [name] is a 67 y.o. female with a history of OSA on CPAP. Returns today for follow-up.  Reports that CPAP is working well.  Continues to find it beneficial.  She uses her old machine when she is traveling.  Her download is below     12/30/21: [name] [name] is a 67 year old female with a history of obstructive sleep apnea on CPAP.  She returns today for follow-up.  She reports that the CPAP is working well.  She denies any new issues.  Returns today for follow-up.   REVIEW OF SYSTEMS: Out of a complete 14 system review of symptoms, the patient complains only of the following symptoms, and all other reviewed systems are negative.  ESS 3   ALLERGIES: Allergies  Allergen Reactions   Aspirin Other (See Comments)    Sores in mouth Sores in mouth   Adhesive [Tape] Other (See Comments)    SKIN BLISTERS   Penicillins     Swelling    Red Yeast Rice Hives   Sulfa Drugs Cross Reactors Hives    HOME MEDICATIONS: Outpatient Medications Prior to Visit  Medication Sig Dispense Refill   Accu-Chek Softclix Lancets lancets USE AS DIRECTED 200 each 3   albuterol (VENTOLIN HFA) 108 (90 Base) MCG/ACT inhaler Inhale 1-2 puffs into the lungs every 6 (six) hours as needed for wheezing or shortness of breath. 6.7 g 3   allopurinol (ZYLOPRIM) 100 MG tablet Take 1 tablet (100 mg total) by mouth daily. 90 tablet 3   atorvastatin (LIPITOR) 80 MG tablet TAKE 1 TABLET BY MOUTH ONCE  DAILY AT 6 PM 100 tablet 2   Blood Glucose Monitoring Suppl (ACCU-CHEK GUIDE) w/Device KIT 1 Device by Does not apply route in the morning, at noon, and at bedtime. 1 kit 0   cetirizine (ZYRTEC) 10 MG tablet Take 10 mg by mouth daily.     citalopram (CELEXA) 20 MG tablet TAKE 1 TABLET BY MOUTH DAILY 90 tablet 3   clopidogrel (PLAVIX) 75 MG tablet TAKE 1  TABLET BY MOUTH DAILY 90 tablet 0   dicyclomine (BENTYL) 20 MG tablet Take 20 mg by mouth 3 (three) times daily.     Dulaglutide (TRULICITY) 0.75 MG/0.5ML SOPN INJECT 0.75MG  (0.5ML) UNDER THE SKIN ONCE A WEEK. 6 mL 1   fluticasone (FLONASE) 50 MCG/ACT nasal spray USE 2 SPRAYS IN BOTH NOSTRILS  DAILY 48 g 0   fluticasone (FLOVENT HFA) 110 MCG/ACT inhaler Inhale 2 puffs into the lungs 2 (two) times daily as needed.     furosemide (LASIX) 20 MG tablet TAKE 1 TABLET BY MOUTH  DAILY AS NEEDED FOR EDEMA  SWELLING (Patient taking differently: Take 20 mg by mouth daily.) 90 tablet 3   gabapentin (NEURONTIN) 300 MG capsule TAKE 2 CAPSULES BY MOUTH IN THE MORNING AND 3 CAPSULES  AT BEDTIME 450 capsule 3   glucose blood test strip Use as instructed 200 each 5   hydrochlorothiazide (HYDRODIURIL) 25 MG tablet Take 1 tablet (25 mg total) by mouth daily. Please schedule follow-up appointment for further refills. Thank you! 30 tablet 0   hydrocortisone cream 1 % Apply 1 application topically 2 (two) times daily.     lisinopril (ZESTRIL) 40 MG tablet TAKE 1 TABLET BY MOUTH DAILY 100 tablet 2   loratadine (CLARITIN) 10 MG tablet Take 10 mg by mouth  daily.     metoprolol succinate (TOPROL-XL) 50 MG 24 hr tablet TAKE 1 TABLET BY MOUTH  TWICE DAILY WITH OR  IMMEDIATELY FOLLOWING A  MEAL 180 tablet 3   Multiple Vitamin (MULTIVITAMIN) tablet Take 1 tablet by mouth daily.     nystatin cream (MYCOSTATIN) Apply 1 application topically as needed for dry skin. 30 g 1   omeprazole (PRILOSEC) 20 MG capsule Take 1 capsule (20 mg total) by mouth daily. 90 capsule 1   ondansetron (ZOFRAN-ODT) 8 MG disintegrating tablet DISSOLVE 1 TABLET ON TOP OF THE  TONGUE EVERY 12 HOURS AS NEEDED  FOR NAUSEA 120 tablet 1   polycarbophil (FIBERCON) 625 MG tablet Take by mouth daily.     PRALUENT 75 MG/ML SOAJ INJECT 75 MG SUBCUTANEOUSLY EVERY 14 DAYS 2 mL 3   traMADol (ULTRAM) 50 MG tablet TAKE 1 TABLET BY MOUTH AT  BEDTIME AS NEEDED 30 tablet  1   triamcinolone cream (KENALOG) 0.1 % APPLY 1 APPLICATION TOPICALLY  DAILY AS NEEDED 60 g 1   No facility-administered medications prior to visit.    PAST MEDICAL HISTORY: Past Medical History:  Diagnosis Date   Allergy    Anal fissure    Anxiety    Arthritis    Asthma    Cataract 2010    left   Colon polyps    diverticulosis  03-2011/2005   Depression    Diabetes mellitus 2012   T2DM   Diverticulosis    Fatty liver 04/2011   GERD (gastroesophageal reflux disease)    Hyperlipidemia    Hypertension severe    Migraine    Obesity    OSA (obstructive sleep apnea)    C-Pap   Pacemaker Medtronic    Sinoatrial node dysfunction (HCC)    syncope    PAST SURGICAL HISTORY: Past Surgical History:  Procedure Laterality Date   ABDOMINAL HYSTERECTOMY  2000   total   CARDIAC CATHETERIZATION N/A 07/20/2016   Procedure: Left Heart Cath and Coronary Angiography;  Surgeon: Peter M Swaziland, MD;  Location: [facility] INVASIVE [facility];  Service: Cardiovascular;  Laterality: N/A;   CATARACT EXTRACTION Left    CORONARY ARTERY BYPASS GRAFT N/A 07/22/2016   Procedure: CORONARY ARTERY BYPASS GRAFTING (CABG) x three , using left internal mammary artery and right leg greater saphenous vein harvested endoscopically;  Surgeon: Kerin Perna, MD;  Location[facility];  Service: Open Heart Surgery;  Laterality: N/A;   EYE SURGERY Right 2010   MULTIPLE TOOTH EXTRACTIONS     OOPHORECTOMY     PACEMAKER PLACEMENT     T/A right knee     TEE WITHOUT CARDIOVERSION N/A 07/22/2016   Procedure: TRANSESOPHAGEAL ECHOCARDIOGRAM (TEE);  Surgeon: Kerin Perna, MD;  [facility];  Service: Open Heart Surgery;  Laterality: N/A;   TONSILLECTOMY      FAMILY HISTORY: Family History  Problem Relation Age of Onset   Heart disease Mother    Cancer Mother 26       lung   Breast cancer Mother 41   Heart disease Father    Heart attack Sister    Diabetes Sister        type 2   Atrial fibrillation Sister    Sleep  apnea Sister    Heart attack Brother    Heart attack Brother    Cancer Maternal Grandmother        breast   Breast cancer Maternal Grandmother    Leukemia Paternal Grandmother  neoplast   Aneurysm Paternal Grandfather        abdominal (stomach)   Colon cancer Neg Hx     SOCIAL HISTORY: Social History   Socioeconomic History   Marital status: Single    Spouse name: Not on file   Number of children: 0   Years of education: Not on file   Highest education level: Not on file  Occupational History   Occupation: chef    Employer: [employer]  Tobacco Use   Smoking status: Former    Types: Cigarettes    Quit date: 1995    Years since quitting: 29.2   Smokeless tobacco: Never   Tobacco comments:    Quit 20 yrs ago  Vaping Use   Vaping Use: Never used  Substance and Sexual Activity   Alcohol use: No    Alcohol/week: 0.0 standard drinks of alcohol   Drug use: No   Sexual activity: Yes    Birth control/protection: Post-menopausal, Other-see comments    Comment: Monogamous   Other Topics Concern   Not on file  Social History Narrative   Single. Exercise: No. Education: College.   Social Determinants of Health   Financial Resource Strain: Low Risk  (01/11/2022)   Overall Financial Resource Strain (CARDIA)    Difficulty of Paying Living Expenses: Not hard at all  Food Insecurity: No Food Insecurity (01/11/2022)   Hunger Vital Sign    Worried About Running Out of Food in the Last Year: Never true    Ran Out of Food in the Last Year: Never true  Transportation Needs: No Transportation Needs (01/11/2022)   PRAPARE - Administrator, Civil Service (Medical): No    Lack of Transportation (Non-Medical): No  Physical Activity: Sufficiently Active (01/11/2022)   Exercise Vital Sign    Days of Exercise per Week: 4 days    Minutes of Exercise per Session: 40 min  Stress: No Stress Concern Present (01/11/2022)   [employer] of Occupational Health -  Occupational Stress Questionnaire    Feeling of Stress : Not at all  Social Connections: Socially Isolated (01/11/2022)   Social Connection and Isolation Panel [NHANES]    Frequency of Communication with Friends and Family: Twice a week    Frequency of Social Gatherings with Friends and Family: Twice a week    Attends Religious Services: Never    Database administrator or Organizations: No    Attends Banker Meetings: Never    Marital Status: Never married  Intimate Partner Violence: Not At Risk (01/11/2022)   Humiliation, Afraid, Rape, and Kick questionnaire    Fear of Current or Ex-Partner: No    Emotionally Abused: No    Physically Abused: No    Sexually Abused: No      PHYSICAL EXAM  There were no vitals filed for this visit.  There is no height or weight on file to calculate BMI.  Generalized: Well developed, in no acute distress  Chest: Lungs clear to auscultation bilaterally  Neurological examination  Mentation: Alert oriented to time, place, history taking. Follows all commands speech and language fluent Cranial nerve II-XII: Extraocular movements were full, visual field were full on confrontational test Head turning and shoulder shrug  were normal and symmetric. Gait and station: Gait is normal.    DIAGNOSTIC DATA (LABS, IMAGING, TESTING) - I reviewed patient records, labs, notes, testing and imaging myself where available.  Lab Results  Component Value Date   WBC 4.2  11/10/2022   HGB 12.5 11/10/2022   HCT 35.3 (L) 11/10/2022   MCV 94.8 11/10/2022   PLT 203.0 11/10/2022      Component Value Date/Time   NA 140 11/10/2022 1014   NA 138 12/16/2021 1048   K 4.1 11/10/2022 1014   CL 99 11/10/2022 1014   CO2 31 11/10/2022 1014   GLUCOSE 132 (H) 11/10/2022 1014   BUN 29 (H) 11/10/2022 1014   BUN 32 (H) 12/16/2021 1048   CREATININE 1.31 (H) 11/10/2022 1014   CREATININE 0.92 07/20/2016 0956   CALCIUM 8.8 11/10/2022 1014   CALCIUM 9.6 07/21/2016 0420    PROT 7.3 11/10/2022 1014   PROT 7.0 12/16/2021 1048   ALBUMIN 4.5 11/10/2022 1014   ALBUMIN 4.6 12/16/2021 1048   AST 21 11/10/2022 1014   ALT 15 11/10/2022 1014   ALKPHOS 89 11/10/2022 1014   BILITOT 0.5 11/10/2022 1014   BILITOT 0.4 12/16/2021 1048   GFRNONAA 63 10/30/2020 1156   GFRNONAA 67 07/20/2016 0956   GFRAA 72 10/30/2020 1156   GFRAA 77 07/20/2016 0956   Lab Results  Component Value Date   CHOL 113 11/10/2022   HDL 38.10 (L) 11/10/2022   LDLCALC 60 12/16/2021   LDLDIRECT 32.0 11/10/2022   TRIG 305.0 (H) 11/10/2022   CHOLHDL 3 11/10/2022   Lab Results  Component Value Date   HGBA1C 6.3 11/10/2022   No results found for: "VITAMINB12" Lab Results  Component Value Date   TSH 1.32 11/10/2022      ASSESSMENT AND PLAN 67 y.o. year old female  has a past medical history of Allergy, Anal fissure, Anxiety, Arthritis, Asthma, Cataract (2010 ), Colon polyps, Depression, Diabetes mellitus (2012), Diverticulosis, Fatty liver (04/2011), GERD (gastroesophageal reflux disease), Hyperlipidemia, Hypertension severe, Migraine, Obesity, OSA (obstructive sleep apnea), Pacemaker Medtronic, and Sinoatrial node dysfunction (HCC). here with:  OSA on CPAP  - CPAP compliance excellent - Good treatment of AHI  - Encourage patient to use CPAP nightly and > 4 hours each night - F/U in 1 year or sooner if needed    Butch Penny, MSN, NP-C 01/04/2023, 3:10 PM [facility] [address] [place], [place] [address] [phone]

## 2023-01-04 ENCOUNTER — Telehealth: Payer: Medicare Other | Admitting: Adult Health

## 2023-01-04 DIAGNOSIS — G4733 Obstructive sleep apnea (adult) (pediatric): Secondary | ICD-10-CM | POA: Diagnosis not present

## 2023-01-13 ENCOUNTER — Ambulatory Visit (INDEPENDENT_AMBULATORY_CARE_PROVIDER_SITE_OTHER): Payer: Medicare Other

## 2023-01-13 VITALS — Ht 62.0 in | Wt 202.0 lb

## 2023-01-13 DIAGNOSIS — Z0000 Encounter for general adult medical examination without abnormal findings: Secondary | ICD-10-CM | POA: Diagnosis not present

## 2023-01-13 NOTE — Patient Instructions (Signed)
[name] , Thank you for taking time to come for your Medicare Wellness Visit. I appreciate your ongoing commitment to your health goals. Please review the following plan we discussed and let me know if I can assist you in the future.   These are the goals we discussed:  Goals      Weight (lb) < 200 lb (90.7 kg)     Continue to lose weight.  Get down to 150-160 pounds.        This is a list of the screening recommended for you and due dates:  Health Maintenance  Topic Date Due   Pneumonia Vaccine (2 of 2 - PCV) 10/28/2020   Colon Cancer Screening  05/07/2022   Eye exam for diabetics  08/24/2022   Complete foot exam   10/28/2022   COVID-19 Vaccine (6 - 2023-24 season) 11/17/2022   Mammogram  04/22/2023   Yearly kidney health urinalysis for diabetes  04/30/2023   Hemoglobin A1C  05/11/2023   Flu Shot  05/12/2023   Yearly kidney function blood test for diabetes  11/11/2023   Medicare Annual Wellness Visit  01/13/2024   DTaP/Tdap/Td vaccine (3 - Td or Tdap) 05/06/2031   DEXA scan (bone density measurement)  Completed   Hepatitis C Screening: USPSTF Recommendation to screen - Ages 23-79 yo.  Completed   Zoster (Shingles) Vaccine  Completed   HPV Vaccine  Aged Out    Advanced directives: Yes; Please bring a copy of your health care power of attorney and living will to the office at your convenience.  Conditions/risks identified: Yes; Type II Diabetes  Next appointment: Follow up in one year for your annual wellness visit.   Preventive Care 69 Years and Older, Female Preventive care refers to lifestyle choices and visits with your health care provider that can promote health and wellness. What does preventive care include? A yearly physical exam. This is also called an annual well check. Dental exams once or twice a year. Routine eye exams. Ask your health care provider how often you should have your eyes checked. Personal lifestyle choices, including: Daily care of your teeth  and gums. Regular physical activity. Eating a healthy diet. Avoiding tobacco and drug use. Limiting alcohol use. Practicing safe sex. Taking low-dose aspirin every day. Taking vitamin and mineral supplements as recommended by your health care provider. What happens during an annual well check? The services and screenings done by your health care provider during your annual well check will depend on your age, overall health, lifestyle risk factors, and family history of disease. Counseling  Your health care provider may ask you questions about your: Alcohol use. Tobacco use. Drug use. Emotional well-being. Home and relationship well-being. Sexual activity. Eating habits. History of falls. Memory and ability to understand (cognition). Work and work Statistician. Reproductive health. Screening  You may have the following tests or measurements: Height, weight, and BMI. Blood pressure. Lipid and cholesterol levels. These may be checked every 5 years, or more frequently if you are over 69 years old. Skin check. Lung cancer screening. You may have this screening every year starting at age 69 if you have a 30-pack-year history of smoking and currently smoke or have quit within the past 15 years. Fecal occult blood test (FOBT) of the stool. You may have this test every year starting at age 69. Flexible sigmoidoscopy or colonoscopy. You may have a sigmoidoscopy every 5 years or a colonoscopy every 10 years starting at age 69. Hepatitis C blood test.  Hepatitis B blood test. Sexually transmitted disease (STD) testing. Diabetes screening. This is done by checking your blood sugar (glucose) after you have not eaten for a while (fasting). You may have this done every 1-3 years. Bone density scan. This is done to screen for osteoporosis. You may have this done starting at age 69. Mammogram. This may be done every 1-2 years. Talk to your health care provider about how often you should have regular  mammograms. Talk with your health care provider about your test results, treatment options, and if necessary, the need for more tests. Vaccines  Your health care provider may recommend certain vaccines, such as: Influenza vaccine. This is recommended every year. Tetanus, diphtheria, and acellular pertussis (Tdap, Td) vaccine. You may need a Td booster every 10 years. Zoster vaccine. You may need this after age 69. Pneumococcal 13-valent conjugate (PCV13) vaccine. One dose is recommended after age 69. Pneumococcal polysaccharide (PPSV23) vaccine. One dose is recommended after age 69. Talk to your health care provider about which screenings and vaccines you need and how often you need them. This information is not intended to replace advice given to you by your health care provider. Make sure you discuss any questions you have with your health care provider. Document Released: 10/24/2015 Document Revised: 06/16/2016 Document Reviewed: 07/29/2015 Elsevier Interactive Patient Education  2017 [employer] Prevention in the Home Falls can cause injuries. They can happen to people of all ages. There are many things you can do to make your home safe and to help prevent falls. What can I do on the outside of my home? Regularly fix the edges of walkways and driveways and fix any cracks. Remove anything that might make you trip as you walk through a door, such as a raised step or threshold. Trim any bushes or trees on the path to your home. Use bright outdoor lighting. Clear any walking paths of anything that might make someone trip, such as rocks or tools. Regularly check to see if handrails are loose or broken. Make sure that both sides of any steps have handrails. Any raised decks and porches should have guardrails on the edges. Have any leaves, snow, or ice cleared regularly. Use sand or salt on walking paths during winter. Clean up any spills in your garage right away. This includes oil  or grease spills. What can I do in the bathroom? Use night lights. Install grab bars by the toilet and in the tub and shower. Do not use towel bars as grab bars. Use non-skid mats or decals in the tub or shower. If you need to sit down in the shower, use a plastic, non-slip stool. Keep the floor dry. Clean up any water that spills on the floor as soon as it happens. Remove soap buildup in the tub or shower regularly. Attach bath mats securely with double-sided non-slip rug tape. Do not have throw rugs and other things on the floor that can make you trip. What can I do in the bedroom? Use night lights. Make sure that you have a light by your bed that is easy to reach. Do not use any sheets or blankets that are too big for your bed. They should not hang down onto the floor. Have a firm chair that has side arms. You can use this for support while you get dressed. Do not have throw rugs and other things on the floor that can make you trip. What can I do in the kitchen? Clean up  any spills right away. Avoid walking on wet floors. Keep items that you use a lot in easy-to-reach places. If you need to reach something above you, use a strong step stool that has a grab bar. Keep electrical cords out of the way. Do not use floor polish or wax that makes floors slippery. If you must use wax, use non-skid floor wax. Do not have throw rugs and other things on the floor that can make you trip. What can I do with my stairs? Do not leave any items on the stairs. Make sure that there are handrails on both sides of the stairs and use them. Fix handrails that are broken or loose. Make sure that handrails are as long as the stairways. Check any carpeting to make sure that it is firmly attached to the stairs. Fix any carpet that is loose or worn. Avoid having throw rugs at the top or bottom of the stairs. If you do have throw rugs, attach them to the floor with carpet tape. Make sure that you have a light  switch at the top of the stairs and the bottom of the stairs. If you do not have them, ask someone to add them for you. What else can I do to help prevent falls? Wear shoes that: Do not have high heels. Have rubber bottoms. Are comfortable and fit you well. Are closed at the toe. Do not wear sandals. If you use a stepladder: Make sure that it is fully opened. Do not climb a closed stepladder. Make sure that both sides of the stepladder are locked into place. Ask someone to hold it for you, if possible. Clearly mark and make sure that you can see: Any grab bars or handrails. First and last steps. Where the edge of each step is. Use tools that help you move around (mobility aids) if they are needed. These include: Canes. Walkers. Scooters. Crutches. Turn on the lights when you go into a dark area. Replace any light bulbs as soon as they burn out. Set up your furniture so you have a clear path. Avoid moving your furniture around. If any of your floors are uneven, fix them. If there are any pets around you, be aware of where they are. Review your medicines with your doctor. Some medicines can make you feel dizzy. This can increase your chance of falling. Ask your doctor what other things that you can do to help prevent falls. This information is not intended to replace advice given to you by your health care provider. Make sure you discuss any questions you have with your health care provider. Document Released: 07/24/2009 Document Revised: 03/04/2016 Document Reviewed: 11/01/2014 Elsevier Interactive Patient Education  2017 [employer].

## 2023-01-13 NOTE — Progress Notes (Signed)
I connected with  [name] on 01/13/23 by a audio enabled telemedicine application and verified that I am speaking with the correct person using two identifiers.  Patient Location: Home  Provider Location: Office/Clinic  I discussed the limitations of evaluation and management by telemedicine. The patient expressed understanding and agreed to proceed.  Subjective:   [name] [name] is a 67 y.o. female who presents for Medicare Annual (Subsequent) preventive examination.  Review of Systems     Cardiac Risk Factors include: advanced age (>6men, >58 women);diabetes mellitus;dyslipidemia;family history of premature cardiovascular disease;hypertension;obesity (BMI >30kg/m2)     Objective:    Today's Vitals   01/13/23 1533  Weight: 202 lb (91.6 kg)  Height: 5\' 2"  (1.575 m)  PainSc: 8   PainLoc: Hand   Body mass index is 36.95 kg/m.     01/13/2023    3:36 PM 01/11/2022   10:36 AM 09/01/2020   10:24 AM 07/20/2016    8:00 PM  Advanced Directives  Does Patient Have a Medical Advance Directive? Yes Yes Yes Yes  Type of Paramedic of [place];Living will [place];Living will [place] will  Does patient want to make changes to medical advance directive?   No - Patient declined No - Patient declined  Copy of [place] in Chart? No - copy requested No - copy requested Yes - validated most recent copy scanned in chart (See row information) No - copy requested    Current Medications (verified) Outpatient Encounter Medications as of 01/13/2023  Medication Sig   Accu-Chek Softclix Lancets lancets USE AS DIRECTED   albuterol (VENTOLIN HFA) 108 (90 Base) MCG/ACT inhaler Inhale 1-2 puffs into the lungs every 6 (six) hours as needed for wheezing or shortness of breath.   allopurinol (ZYLOPRIM) 100 MG tablet Take 1 tablet (100 mg total) by mouth daily.   atorvastatin (LIPITOR) 80 MG tablet TAKE 1 TABLET  BY MOUTH ONCE  DAILY AT 6 PM   Blood Glucose Monitoring Suppl (ACCU-CHEK GUIDE) w/Device KIT 1 Device by Does not apply route in the morning, at noon, and at bedtime.   cetirizine (ZYRTEC) 10 MG tablet Take 10 mg by mouth daily.   citalopram (CELEXA) 20 MG tablet TAKE 1 TABLET BY MOUTH DAILY   clopidogrel (PLAVIX) 75 MG tablet TAKE 1 TABLET BY MOUTH DAILY   dicyclomine (BENTYL) 20 MG tablet Take 20 mg by mouth 3 (three) times daily.   Dulaglutide (TRULICITY) [id number] [id number] SOPN INJECT 0.75MG  (0.5ML) UNDER THE SKIN ONCE A WEEK.   fluticasone (FLONASE) 50 MCG/ACT nasal spray USE 2 SPRAYS IN BOTH NOSTRILS  DAILY   fluticasone (FLOVENT HFA) 110 MCG/ACT inhaler Inhale 2 puffs into the lungs 2 (two) times daily as needed.   furosemide (LASIX) 20 MG tablet TAKE 1 TABLET BY MOUTH  DAILY AS NEEDED FOR EDEMA  SWELLING (Patient taking differently: Take 20 mg by mouth daily.)   gabapentin (NEURONTIN) 300 MG capsule TAKE 2 CAPSULES BY MOUTH IN THE MORNING AND 3 CAPSULES  AT BEDTIME   glucose blood test strip Use as instructed   hydrochlorothiazide (HYDRODIURIL) 25 MG tablet Take 1 tablet (25 mg total) by mouth daily. Please schedule follow-up appointment for further refills. Thank you!   hydrocortisone cream 1 % Apply 1 application topically 2 (two) times daily.   lisinopril (ZESTRIL) 40 MG tablet TAKE 1 TABLET BY MOUTH DAILY   loratadine (CLARITIN) 10 MG tablet Take 10 mg by mouth daily.  metoprolol succinate (TOPROL-XL) 50 MG 24 hr tablet TAKE 1 TABLET BY MOUTH  TWICE DAILY WITH OR  IMMEDIATELY FOLLOWING A  MEAL   Multiple Vitamin (MULTIVITAMIN) tablet Take 1 tablet by mouth daily.   nystatin cream (MYCOSTATIN) Apply 1 application topically as needed for dry skin.   omeprazole (PRILOSEC) 20 MG capsule Take 1 capsule (20 mg total) by mouth daily.   ondansetron (ZOFRAN-ODT) 8 MG disintegrating tablet DISSOLVE 1 TABLET ON TOP OF THE  TONGUE EVERY 12 HOURS AS NEEDED  FOR NAUSEA   polycarbophil (FIBERCON) 625  MG tablet Take by mouth daily.   PRALUENT 75 MG/ML SOAJ INJECT 75 MG SUBCUTANEOUSLY EVERY 14 DAYS   traMADol (ULTRAM) 50 MG tablet TAKE 1 TABLET BY MOUTH AT  BEDTIME AS NEEDED   triamcinolone cream (KENALOG) 0.1 % APPLY 1 APPLICATION TOPICALLY  DAILY AS NEEDED   No facility-administered encounter medications on file as of 01/13/2023.    Allergies (verified) Aspirin, Adhesive [tape], Penicillins, Red yeast rice, and Sulfa drugs cross reactors   History: Past Medical History:  Diagnosis Date   Allergy    Anal fissure    Anxiety    Arthritis    Asthma    Cataract 2010    left   Colon polyps    diverticulosis  03-2011/2005   Depression    Diabetes mellitus 2012   T2DM   Diverticulosis    Fatty liver 04/2011   GERD (gastroesophageal reflux disease)    Hyperlipidemia    Hypertension severe    Migraine    Obesity    OSA (obstructive sleep apnea)    C-Pap   Pacemaker Medtronic    Sinoatrial node dysfunction    syncope   Past Surgical History:  Procedure Laterality Date   ABDOMINAL HYSTERECTOMY  2000   total   CARDIAC CATHETERIZATION N/A 07/20/2016   Procedure: Left Heart Cath and Coronary Angiography;  Surgeon: Peter M Martinique, MD;  Location: [facility];  Service: Cardiovascular;  Laterality: N/A;   CATARACT EXTRACTION Left    CORONARY ARTERY BYPASS GRAFT N/A 07/22/2016   Procedure: CORONARY ARTERY BYPASS GRAFTING (CABG) x three , using left internal mammary artery and right leg greater saphenous vein harvested endoscopically;  Surgeon: Ivin Poot, MD;  Location: [facility];  Service: Open Heart Surgery;  Laterality: N/A;   EYE SURGERY Right 2010   MULTIPLE TOOTH EXTRACTIONS     OOPHORECTOMY     PACEMAKER PLACEMENT     T/A right knee     TEE WITHOUT CARDIOVERSION N/A 07/22/2016   Procedure: TRANSESOPHAGEAL ECHOCARDIOGRAM (TEE);  Surgeon: Ivin Poot, MD;  Location: [facility];  Service: Open Heart Surgery;  Laterality: N/A;   TONSILLECTOMY     Family History   Problem Relation Age of Onset   Heart disease Mother    Cancer Mother 29       lung   Breast cancer Mother 75   Heart disease Father    Heart attack Sister    Diabetes Sister        type 2   Atrial fibrillation Sister    Sleep apnea Sister    Heart attack Brother    Heart attack Brother    Cancer Maternal Grandmother        breast   Breast cancer Maternal Grandmother    Leukemia Paternal Grandmother        neoplast   Aneurysm Paternal Grandfather        abdominal (stomach)  Colon cancer Neg Hx    Social History   Socioeconomic History   Marital status: Single    Spouse name: Not on file   Number of children: 0   Years of education: Not on file   Highest education level: Not on file  Occupational History   Occupation: chef    Employer: [employer]  Tobacco Use   Smoking status: Former    Types: Cigarettes    Quit date: 1995    Years since quitting: 29.2   Smokeless tobacco: Never   Tobacco comments:    Quit 20 yrs ago  Vaping Use   Vaping Use: Never used  Substance and Sexual Activity   Alcohol use: No    Alcohol/week: 0.0 standard drinks of alcohol   Drug use: No   Sexual activity: Yes    Birth control/protection: Post-menopausal, Other-see comments    Comment: Monogamous   Other Topics Concern   Not on file  Social History Narrative   Single. Exercise: No. Education: College.   Social Determinants of Health   Financial Resource Strain: Low Risk  (01/13/2023)   Overall Financial Resource Strain (CARDIA)    Difficulty of Paying Living Expenses: Not hard at all  Food Insecurity: No Food Insecurity (01/13/2023)   Hunger Vital Sign    Worried About Running Out of Food in the Last Year: Never true    Ran Out of Food in the Last Year: Never true  Transportation Needs: No Transportation Needs (01/13/2023)   PRAPARE - Hydrologist (Medical): No    Lack of Transportation (Non-Medical): No  Physical Activity: Sufficiently  Active (01/13/2023)   Exercise Vital Sign    Days of Exercise per Week: 5 days    Minutes of Exercise per Session: 30 min  Stress: No Stress Concern Present (01/13/2023)   [place]    Feeling of Stress : Not at all  Social Connections: Socially Isolated (01/13/2023)   Social Connection and Isolation Panel [NHANES]    Frequency of Communication with Friends and Family: Twice a week    Frequency of Social Gatherings with Friends and Family: Twice a week    Attends Religious Services: Never    Marine scientist or Organizations: No    Attends Music therapist: Never    Marital Status: Never married    Tobacco Counseling Counseling given: Not Answered Tobacco comments: Quit 20 yrs ago   Clinical Intake:  Pre-visit preparation completed: Yes  Pain : 0-10 Pain Score: 8  Pain Type: Chronic pain Pain Location: Wrist Pain Orientation: Right, Left     BMI - recorded: 36.95 Nutritional Status: BMI > 30  Obese Nutritional Risks: None Diabetes: No  How often do you need to have someone help you when you read instructions, pamphlets, or other written materials from your doctor or pharmacy?: 1 - Never What is the last grade level you completed in school?: College Graduate  Nutrition Risk Assessment:  Has the patient had any N/V/D within the last 2 months?  No  Does the patient have any non-healing wounds?  No  Has the patient had any unintentional weight loss or weight gain?  No   Diabetes:  Is the patient diabetic?  Yes  If diabetic, was a CBG obtained today?  No  Did the patient bring in their glucometer from home?  No  How often do you monitor your CBG's? Twice per  week.   Financial Strains and Diabetes Management:  Are you having any financial strains with the device, your supplies or your medication? No .  Does the patient want to be seen by Chronic Care Management for management of their  diabetes?  No  Would the patient like to be referred to a Nutritionist or for Diabetic Management?  No   Diabetic Exams:  Diabetic Eye Exam: Completed 06/08/2022 Diabetic Foot Exam: Completed 10/28/2021; overdue   Interpreter Needed?: No  Information entered by :: Lisette Abu, LPN.   Activities of Daily Living    01/13/2023    3:39 PM  In your present state of health, do you have any difficulty performing the following activities:  Hearing? 0  Vision? 0  Difficulty concentrating or making decisions? 0  Walking or climbing stairs? 0  Dressing or bathing? 0  Doing errands, shopping? 0  Preparing Food and eating ? N  Using the Toilet? N  In the past six months, have you accidently leaked urine? N  Do you have problems with loss of bowel control? N  Managing your Medications? N  Managing your Finances? N  Housekeeping or managing your Housekeeping? N    Patient Care Team: Horald Pollen, MD as PCP - General (Internal Medicine) End, Harrell Gave, MD as PCP - Cardiology (Cardiology) Himmelrich, Bryson Ha, RD (Inactive) as Dietitian Deboraha Sprang, MD as Attending Physician (Cardiology) Marygrace Drought, MD as Consulting Physician (Ophthalmology) Vickey Huger, MD as Consulting Physician (Orthopedic Surgery)  Indicate any recent Medical Services you may have received from other than [facility] providers in the past year (date may be approximate).     Assessment:   This is a routine wellness examination for [name] [name].  Hearing/Vision screen Hearing Screening - Comments:: Denies hearing difficulties   Vision Screening - Comments:: Cataracts removed- up to date with routine eye exams with Marygrace Drought, MD.   Dietary issues and exercise activities discussed: Current Exercise Habits: Home exercise routine, Type of exercise: walking, Time (Minutes): 30, Frequency (Times/Week): 5, Weekly Exercise (Minutes/Week): 150, Intensity: Moderate, Exercise limited by: cardiac  condition(s);Other - see comments (chronic pain)   Goals Addressed             This Visit's Progress    Weight (lb) < 200 lb (90.7 kg)   202 lb (91.6 kg)    Continue to lose weight.  Get down to 150-160 pounds.      Depression Screen    01/13/2023    3:38 PM 11/10/2022   12:14 PM 06/09/2022    1:10 PM 04/29/2022    9:20 AM 01/11/2022   10:37 AM 01/11/2022   10:35 AM 10/28/2021   12:25 PM  PHQ 2/9 Scores  PHQ - 2 Score 0 0 0 5 0 0 0  PHQ- 9 Score 0   13       Fall Risk    01/13/2023    3:38 PM 11/10/2022   12:14 PM 06/09/2022    1:09 PM 04/29/2022   11:34 AM 04/29/2022    9:19 AM  Fall Risk   Falls in the past year? 0 0 1 0 1  Number falls in past yr: 0 0 0 0 1  Injury with Fall? 0 0 0 0 0  Risk for fall due to : No Fall Risks  No Fall Risks    Follow up Falls prevention discussed  Falls evaluation completed      FALL RISK PREVENTION PERTAINING TO THE HOME:  Any stairs  in or around the home? Yes  If so, are there any without handrails? No  Home free of loose throw rugs in walkways, pet beds, electrical cords, etc? Yes  Adequate lighting in your home to reduce risk of falls? Yes   ASSISTIVE DEVICES UTILIZED TO PREVENT FALLS:  Life alert? No  Use of a cane, walker or w/c? No  Grab bars in the bathroom? Yes  Shower chair or bench in shower? Yes  Elevated toilet seat or a handicapped toilet? No   TIMED UP AND GO:  Was the test performed? No . Telephone Visit  Cognitive Function:        01/13/2023    3:44 PM 09/01/2020   10:24 AM  6CIT Screen  What Year? 0 points 0 points  What month? 0 points 0 points  What time? 0 points 0 points  Count back from 20 0 points 0 points  Months in reverse 0 points 0 points  Repeat phrase 0 points 0 points  Total Score 0 points 0 points    Immunizations Immunization History  Administered Date(s) Administered   Fluad Quad(high Dose 65+) 10/09/2020, 09/22/2022   Hepatitis B 01/03/2012   Influenza, High Dose Seasonal PF  06/21/2019   Influenza, Seasonal, Injecte, Preservative Fre 09/26/2012   Influenza,inj,Quad PF,6+ Mos 11/22/2013, 09/10/2014, 09/15/2016, 10/19/2018   Influenza-Unspecified 06/21/2019, 07/31/2020   Moderna SARS-COV2 Booster Vaccination 05/05/2021   Moderna Sars-Covid-2 Vaccination 11/25/2019, 12/24/2019, 08/12/2020   Pneumococcal Polysaccharide-23 04/10/2008, 10/29/2019   Tdap 08/31/2010, 05/05/2021   Unspecified SARS-COV-2 Vaccination 09/22/2022   Zoster Recombinat (Shingrix) 06/21/2019, 10/29/2019    TDAP status: Up to date  Flu Vaccine status: Up to date  Pneumococcal vaccine status: Due, Education has been provided regarding the importance of this vaccine. Advised may receive this vaccine at local pharmacy or Health Dept. Aware to provide a copy of the vaccination record if obtained from local pharmacy or Health Dept. Verbalized acceptance and understanding.  Covid-19 vaccine status: Completed vaccines  Qualifies for Shingles Vaccine? Yes   Zostavax completed No   Shingrix Completed?: Yes  Screening Tests Health Maintenance  Topic Date Due   Pneumonia Vaccine 58+ Years old (2 of 2 - PCV) 10/28/2020   COLONOSCOPY (Pts 45-63yrs Insurance coverage will need to be confirmed)  05/07/2022   OPHTHALMOLOGY EXAM  08/24/2022   FOOT EXAM  10/28/2022   COVID-19 Vaccine (6 - 2023-24 season) 11/17/2022   MAMMOGRAM  04/22/2023   Diabetic kidney evaluation - Urine ACR  04/30/2023   HEMOGLOBIN A1C  05/11/2023   INFLUENZA VACCINE  05/12/2023   Diabetic kidney evaluation - eGFR measurement  11/11/2023   Medicare Annual Wellness (AWV)  01/13/2024   DTaP/Tdap/Td (3 - Td or Tdap) 05/06/2031   DEXA SCAN  Completed   Hepatitis C Screening  Completed   Zoster Vaccines- Shingrix  Completed   HPV VACCINES  Aged Out    Health Maintenance  Health Maintenance Due  Topic Date Due   Pneumonia Vaccine 93+ Years old (2 of 2 - PCV) 10/28/2020   COLONOSCOPY (Pts 45-27yrs Insurance coverage will  need to be confirmed)  05/07/2022   OPHTHALMOLOGY EXAM  08/24/2022   FOOT EXAM  10/28/2022   COVID-19 Vaccine (6 - 2023-24 season) 11/17/2022    Colorectal cancer screening: Type of screening: Colonoscopy. Completed 05/08/2019. Repeat every 5 years  Mammogram status: Completed 04/21/2022. Repeat every year  Bone Density status: Completed 03/18/2021. Results reflect: Bone density results: OSTEOPENIA. Repeat every 2-3 years.  Lung Cancer Screening: (Low  Dose CT Chest recommended if Age 47-80 years, 30 pack-year currently smoking OR have quit w/in 15years.) does not qualify.   Lung Cancer Screening Referral: no  Additional Screening:  Hepatitis C Screening: does qualify; Completed 05/01/2014  Vision Screening: Recommended annual ophthalmology exams for early detection of glaucoma and other disorders of the eye. Is the patient up to date with their annual eye exam?  Yes  Who is the provider or what is the name of the office in which the patient attends annual eye exams? Marygrace Drought, MD. If pt is not established with a provider, would they like to be referred to a provider to establish care? No .   Dental Screening: Recommended annual dental exams for proper oral hygiene  Community Resource Referral / Chronic Care Management: CRR required this visit?  No   CCM required this visit?  No      Plan:     I have personally reviewed and noted the following in the patient's chart:   Medical and social history Use of alcohol, tobacco or illicit drugs  Current medications and supplements including opioid prescriptions. Patient is not currently taking opioid prescriptions. Functional ability and status Nutritional status Physical activity Advanced directives List of other physicians Hospitalizations, surgeries, and ER visits in previous 12 months Vitals Screenings to include cognitive, depression, and falls Referrals and appointments  In addition, I have reviewed and discussed with  patient certain preventive protocols, quality metrics, and best practice recommendations. A written personalized care plan for preventive services as well as general preventive health recommendations were provided to patient.     Sheral Flow, LPN   [id number]   Nurse Notes:  Normal cognitive status assessed by direct observation by this Nurse Health Advisor via telephone. No abnormalities found.

## 2023-01-19 ENCOUNTER — Other Ambulatory Visit: Payer: Self-pay | Admitting: Internal Medicine

## 2023-01-19 ENCOUNTER — Other Ambulatory Visit
Admission: RE | Admit: 2023-01-19 | Discharge: 2023-01-19 | Disposition: A | Discharge status: Home / Self Care | Payer: Medicare Other | Attending: Medical | Admitting: Medical

## 2023-01-19 DIAGNOSIS — Z950 Presence of cardiac pacemaker: Secondary | ICD-10-CM | POA: Insufficient documentation

## 2023-01-19 DIAGNOSIS — I10 Essential (primary) hypertension: Secondary | ICD-10-CM | POA: Diagnosis not present

## 2023-01-19 DIAGNOSIS — I495 Sick sinus syndrome: Secondary | ICD-10-CM | POA: Insufficient documentation

## 2023-01-19 DIAGNOSIS — I2510 Atherosclerotic heart disease of native coronary artery without angina pectoris: Secondary | ICD-10-CM | POA: Insufficient documentation

## 2023-01-19 DIAGNOSIS — I5032 Chronic diastolic (congestive) heart failure: Secondary | ICD-10-CM | POA: Insufficient documentation

## 2023-01-19 LAB — BASIC METABOLIC PANEL WITH GFR
Anion gap: 9 (ref 5–15)
BUN: 12 mg/dL (ref 8–23)
CO2: 25 mmol/L (ref 22–32)
Calcium: 9.2 mg/dL (ref 8.9–10.3)
Chloride: 106 mmol/L (ref 98–111)
Creatinine, Ser: 0.83 mg/dL (ref 0.44–1.00)
GFR, Estimated: >60 mL/min
Glucose, Bld: 117 mg/dL — ABNORMAL HIGH (ref 70–99)
Potassium: 3.7 mmol/L (ref 3.5–5.1)
Sodium: 140 mmol/L (ref 135–145)

## 2023-01-24 ENCOUNTER — Other Ambulatory Visit: Payer: Self-pay | Admitting: Internal Medicine

## 2023-01-25 DIAGNOSIS — G4733 Obstructive sleep apnea (adult) (pediatric): Secondary | ICD-10-CM | POA: Diagnosis not present

## 2023-01-26 ENCOUNTER — Telehealth: Payer: Self-pay | Admitting: Medical

## 2023-01-26 NOTE — Telephone Encounter (Signed)
Called and left detailed message on patient's VM per DPR. Advised that labs are stable and dehydration seems to be resolved per C. Fransico Michael PA. Advised patient to call office if she has any questions.

## 2023-01-26 NOTE — Telephone Encounter (Signed)
° °  Pt is returning call to get lab result °

## 2023-02-13 ENCOUNTER — Other Ambulatory Visit: Payer: Self-pay | Admitting: Internal Medicine

## 2023-02-14 NOTE — Telephone Encounter (Signed)
last visit 12/27/2022--Stop hydrochlorothiazide--Disposition: Follow up in 1 year(s) with MD/APP   next visit--none

## 2023-02-17 ENCOUNTER — Other Ambulatory Visit: Payer: Self-pay | Admitting: Internal Medicine

## 2023-02-24 ENCOUNTER — Other Ambulatory Visit: Payer: Self-pay | Admitting: Internal Medicine

## 2023-03-06 ENCOUNTER — Other Ambulatory Visit: Payer: Self-pay | Admitting: Emergency Medicine

## 2023-03-11 ENCOUNTER — Ambulatory Visit (INDEPENDENT_AMBULATORY_CARE_PROVIDER_SITE_OTHER): Payer: Medicare Other

## 2023-03-11 DIAGNOSIS — I495 Sick sinus syndrome: Secondary | ICD-10-CM | POA: Diagnosis not present

## 2023-03-16 LAB — CUP PACEART REMOTE DEVICE CHECK
Battery Impedance: 2202 Ohm
Battery Remaining Longevity: 39 mo
Battery Voltage: 2.74 V
Brady Statistic AP VP Percent: 0 %
Brady Statistic AP VS Percent: 0 %
Brady Statistic AS VP Percent: 1 %
Brady Statistic AS VS Percent: 99 %
Date Time Interrogation Session: 20240604231822
Implantable Lead Connection Status: 753985
Implantable Lead Connection Status: 753985
Implantable Lead Implant Date: 20091201
Implantable Lead Implant Date: 20091201
Implantable Lead Location: 753859
Implantable Lead Location: 753860
Implantable Lead Model: 5076
Implantable Lead Model: 5076
Implantable Pulse Generator Implant Date: 20091201
Lead Channel Impedance Value: 395 Ohm
Lead Channel Impedance Value: 567 Ohm
Lead Channel Pacing Threshold Amplitude: 0.5 V
Lead Channel Pacing Threshold Amplitude: 0.75 V
Lead Channel Pacing Threshold Pulse Width: 0.4 ms
Lead Channel Pacing Threshold Pulse Width: 0.4 ms
Lead Channel Setting Pacing Amplitude: 2 V
Lead Channel Setting Pacing Amplitude: 2.5 V
Lead Channel Setting Pacing Pulse Width: 0.4 ms
Lead Channel Setting Sensing Sensitivity: 2 mV
Zone Setting Status: 755011
Zone Setting Status: 755011

## 2023-03-29 NOTE — Progress Notes (Signed)
Remote pacemaker transmission.   

## 2023-04-13 ENCOUNTER — Ambulatory Visit: Payer: Medicare Other | Admitting: Emergency Medicine

## 2023-04-18 ENCOUNTER — Ambulatory Visit (INDEPENDENT_AMBULATORY_CARE_PROVIDER_SITE_OTHER): Payer: Medicare Other | Admitting: Emergency Medicine

## 2023-04-18 ENCOUNTER — Encounter: Payer: Self-pay | Admitting: Emergency Medicine

## 2023-04-18 VITALS — BP 130/70 | HR 60 | Temp 98.2°F | Ht 62.0 in | Wt 200.5 lb

## 2023-04-18 DIAGNOSIS — Z8739 Personal history of other diseases of the musculoskeletal system and connective tissue: Secondary | ICD-10-CM | POA: Insufficient documentation

## 2023-04-18 DIAGNOSIS — I152 Hypertension secondary to endocrine disorders: Secondary | ICD-10-CM

## 2023-04-18 DIAGNOSIS — E1159 Type 2 diabetes mellitus with other circulatory complications: Secondary | ICD-10-CM

## 2023-04-18 DIAGNOSIS — I5032 Chronic diastolic (congestive) heart failure: Secondary | ICD-10-CM | POA: Diagnosis not present

## 2023-04-18 DIAGNOSIS — E785 Hyperlipidemia, unspecified: Secondary | ICD-10-CM

## 2023-04-18 DIAGNOSIS — M109 Gout, unspecified: Secondary | ICD-10-CM | POA: Insufficient documentation

## 2023-04-18 DIAGNOSIS — E1169 Type 2 diabetes mellitus with other specified complication: Secondary | ICD-10-CM | POA: Diagnosis not present

## 2023-04-18 DIAGNOSIS — Z7985 Long-term (current) use of injectable non-insulin antidiabetic drugs: Secondary | ICD-10-CM

## 2023-04-18 DIAGNOSIS — I25119 Atherosclerotic heart disease of native coronary artery with unspecified angina pectoris: Secondary | ICD-10-CM

## 2023-04-18 LAB — CBC WITH DIFFERENTIAL/PLATELET
Basophils Absolute: 0 10*3/uL (ref 0.0–0.1)
Basophils Relative: 0.9 % (ref 0.0–3.0)
Eosinophils Absolute: 0.1 10*3/uL (ref 0.0–0.7)
Eosinophils Relative: 3.1 % (ref 0.0–5.0)
HCT: 38.6 % (ref 36.0–46.0)
Hemoglobin: 13 g/dL (ref 12.0–15.0)
Lymphocytes Relative: 30.9 % (ref 12.0–46.0)
Lymphs Abs: 1.1 10*3/uL (ref 0.7–4.0)
MCHC: 33.8 g/dL (ref 30.0–36.0)
MCV: 96.3 fl (ref 78.0–100.0)
Monocytes Absolute: 0.3 10*3/uL (ref 0.1–1.0)
Monocytes Relative: 9.5 % (ref 3.0–12.0)
Neutro Abs: 2 10*3/uL (ref 1.4–7.7)
Neutrophils Relative %: 55.6 % (ref 43.0–77.0)
Platelets: 221 10*3/uL (ref 150.0–400.0)
RBC: 4.01 Mil/uL (ref 3.87–5.11)
RDW: 14 % (ref 11.5–15.5)
WBC: 3.6 10*3/uL — ABNORMAL LOW (ref 4.0–10.5)

## 2023-04-18 LAB — COMPREHENSIVE METABOLIC PANEL
ALT: 13 U/L (ref 0–35)
AST: 23 U/L (ref 0–37)
Albumin: 4.6 g/dL (ref 3.5–5.2)
Alkaline Phosphatase: 92 U/L (ref 39–117)
BUN: 11 mg/dL (ref 6–23)
CO2: 31 mEq/L (ref 19–32)
Calcium: 10 mg/dL (ref 8.4–10.5)
Chloride: 101 mEq/L (ref 96–112)
Creatinine, Ser: 0.94 mg/dL (ref 0.40–1.20)
GFR: 62.12 mL/min (ref >60.00)
Glucose, Bld: 106 mg/dL — ABNORMAL HIGH (ref 70–99)
Potassium: 4.5 mEq/L (ref 3.5–5.1)
Sodium: 141 mEq/L (ref 135–145)
Total Bilirubin: 0.5 mg/dL (ref 0.2–1.2)
Total Protein: 7.5 g/dL (ref 6.0–8.3)

## 2023-04-18 LAB — LIPID PANEL
Cholesterol: 129 mg/dL (ref 0–200)
HDL: 40.8 mg/dL (ref >39.00)
NonHDL: 88.17
Total CHOL/HDL Ratio: 3
Triglycerides: 264 mg/dL — ABNORMAL HIGH (ref 0.0–149.0)
VLDL: 52.8 mg/dL — ABNORMAL HIGH (ref 0.0–40.0)

## 2023-04-18 LAB — LDL CHOLESTEROL, DIRECT: Direct LDL: 47 mg/dL

## 2023-04-18 LAB — HEMOGLOBIN A1C: Hgb A1c MFr Bld: 6 % (ref 4.6–6.5)

## 2023-04-18 LAB — URIC ACID: Uric Acid, Serum: 6 mg/dL (ref 2.4–7.0)

## 2023-04-18 MED ORDER — COLCHICINE 0.6 MG PO TABS: 0.6000 mg | ORAL_TABLET | Freq: Two times a day (BID) | ORAL | 1 refills | Status: AC

## 2023-04-18 NOTE — Assessment & Plan Note (Signed)
Well-controlled hypertension Continue metoprolol succinate 50 mg daily and lisinopril 40 mg daily Well-controlled diabetes Continue weekly Trulicity 0.75 mg Cardiovascular risks associated with hypertension and diabetes discussed Diet and nutrition discussed

## 2023-04-18 NOTE — Patient Instructions (Signed)
Health Maintenance After Age 69 After age 69, you are at a higher risk for certain long-term diseases and infections as well as injuries from falls. Falls are a major cause of broken bones and head injuries in people who are older than age 69. Getting regular preventive care can help to keep you healthy and well. Preventive care includes getting regular testing and making lifestyle changes as recommended by your health care provider. Talk with your health care provider about: Which screenings and tests you should have. A screening is a test that checks for a disease when you have no symptoms. A diet and exercise plan that is right for you. What should I know about screenings and tests to prevent falls? Screening and testing are the best ways to find a health problem early. Early diagnosis and treatment give you the best chance of managing medical conditions that are common after age 69. Certain conditions and lifestyle choices may make you more likely to have a fall. Your health care provider may recommend: Regular vision checks. Poor vision and conditions such as cataracts can make you more likely to have a fall. If you wear glasses, make sure to get your prescription updated if your vision changes. Medicine review. Work with your health care provider to regularly review all of the medicines you are taking, including over-the-counter medicines. Ask your health care provider about any side effects that may make you more likely to have a fall. Tell your health care provider if any medicines that you take make you feel dizzy or sleepy. Strength and balance checks. Your health care provider may recommend certain tests to check your strength and balance while standing, walking, or changing positions. Foot health exam. Foot pain and numbness, as well as not wearing proper footwear, can make you more likely to have a fall. Screenings, including: Osteoporosis screening. Osteoporosis is a condition that causes  the bones to get weaker and break more easily. Blood pressure screening. Blood pressure changes and medicines to control blood pressure can make you feel dizzy. Depression screening. You may be more likely to have a fall if you have a fear of falling, feel depressed, or feel unable to do activities that you used to do. Alcohol use screening. Using too much alcohol can affect your balance and may make you more likely to have a fall. Follow these instructions at home: Lifestyle Do not drink alcohol if: Your health care provider tells you not to drink. If you drink alcohol: Limit how much you have to: 0-1 drink a day for women. 0-2 drinks a day for men. Know how much alcohol is in your drink. In the [place] one drink equals one 12 oz bottle of beer (355 mL), one 5 oz glass of wine (148 mL), or one 1 oz glass of hard liquor (44 mL). Do not use any products that contain nicotine or tobacco. These products include cigarettes, chewing tobacco, and vaping devices, such as e-cigarettes. If you need help quitting, ask your health care provider. Activity  Follow a regular exercise program to stay fit. This will help you maintain your balance. Ask your health care provider what types of exercise are appropriate for you. If you need a cane or walker, use it as recommended by your health care provider. Wear supportive shoes that have nonskid soles. Safety  Remove any tripping hazards, such as rugs, cords, and clutter. Install safety equipment such as grab bars in bathrooms and safety rails on stairs. Keep rooms and walkways   well-lit. General instructions Talk with your health care provider about your risks for falling. Tell your health care provider if: You fall. Be sure to tell your health care provider about all falls, even ones that seem minor. You feel dizzy, tiredness (fatigue), or off-balance. Take over-the-counter and prescription medicines only as told by your health care provider. These include  supplements. Eat a healthy diet and maintain a healthy weight. A healthy diet includes low-fat dairy products, low-fat (lean) meats, and fiber from whole grains, beans, and lots of fruits and vegetables. Stay current with your vaccines. Schedule regular health, dental, and eye exams. Summary Having a healthy lifestyle and getting preventive care can help to protect your health and wellness after age 69. Screening and testing are the best way to find a health problem early and help you avoid having a fall. Early diagnosis and treatment give you the best chance for managing medical conditions that are more common for people who are older than age 69. Falls are a major cause of broken bones and head injuries in people who are older than age 69. Take precautions to prevent a fall at home. Work with your health care provider to learn what changes you can make to improve your health and wellness and to prevent falls. This information is not intended to replace advice given to you by your health care provider. Make sure you discuss any questions you have with your health care provider. Document Revised: 02/16/2021 Document Reviewed: 02/16/2021 Elsevier Patient Education  2024 [employer]  

## 2023-04-18 NOTE — Assessment & Plan Note (Signed)
No clinical findings of acute congestive heart failure Continues furosemide 20 mg daily along with metoprolol succinate 50 mg daily and lisinopril 40 mg daily

## 2023-04-18 NOTE — Progress Notes (Signed)
Lacey Ammar 67 y.o.   Chief Complaint  Patient presents with   gout flare up    Patient states she maybe having a gout flare up in her right wrist/hand, started about 2 weeks ago    HISTORY OF PRESENT ILLNESS: This is a 67 y.o. female complaining of pain to right wrist area.  Thinks it is a gout flareup.  Denies injury. Better than last week when it started. No other complaints or medical concerns today. Lab Results  Component Value Date   HGBA1C 6.3 11/10/2022   Wt Readings from Last 3 Encounters:  04/18/23 200 lb 8 oz (90.9 kg)  01/13/23 202 lb (91.6 kg)  12/27/22 201 lb 12.8 oz (91.5 kg)     HPI   Prior to Admission medications   Medication Sig Start Date End Date Taking? Authorizing Provider  Accu-Chek Softclix Lancets lancets USE AS DIRECTED 05/10/21   Georgina Quint, MD  albuterol (VENTOLIN HFA) 108 (90 Base) MCG/ACT inhaler Inhale 1-2 puffs into the lungs every 6 (six) hours as needed for wheezing or shortness of breath. 04/17/19   Georgina Quint, MD  Alirocumab (PRALUENT) 75 MG/ML SOAJ INJECT 75 MG SUBCUTANEOUSLY EVERY 14 DAYSS 02/17/23   End, Cristal Deer, MD  allopurinol (ZYLOPRIM) 100 MG tablet Take 1 tablet (100 mg total) by mouth daily. 11/10/22   Georgina Quint, MD  atorvastatin (LIPITOR) 80 MG tablet TAKE 1 TABLET BY MOUTH ONCE  DAILY AT 6 PM 10/09/22   Cherl Gorney, Eilleen Kempf, MD  Blood Glucose Monitoring Suppl (ACCU-CHEK GUIDE) w/Device KIT 1 Device by Does not apply route in the morning, at noon, and at bedtime. 05/24/22   Georgina Quint, MD  cetirizine (ZYRTEC) 10 MG tablet Take 10 mg by mouth daily.    [provider]  citalopram (CELEXA) 20 MG tablet TAKE 1 TABLET BY MOUTH DAILY 11/21/22   Georgina Quint, MD  clopidogrel (PLAVIX) 75 MG tablet TAKE 1 TABLET BY MOUTH DAILY 02/25/23   End, Cristal Deer, MD  dicyclomine (BENTYL) 20 MG tablet Take 20 mg by mouth 3 (three) times daily. 07/11/21   [provider]   Dulaglutide (TRULICITY) 0.75 MG/0.5ML SOPN INJECT 0.75MG  (0.5ML) UNDER THE SKIN ONCE A WEEK. 08/10/22   Georgina Quint, MD  fluticasone [facility]) 50 MCG/ACT nasal spray USE 2 SPRAYS IN BOTH NOSTRILS  DAILY 04/29/22   Georgina Quint, MD  fluticasone (FLOVENT HFA) 110 MCG/ACT inhaler Inhale 2 puffs into the lungs 2 (two) times daily as needed.    [provider]  furosemide (LASIX) 20 MG tablet TAKE 1 TABLET BY MOUTH DAILY AS  NEEDED FOR EDEMA SWELLING 01/24/23   End, Cristal Deer, MD  gabapentin (NEURONTIN) 300 MG capsule TAKE 2 CAPSULES BY MOUTH IN THE MORNING AND 3 CAPSULES  AT BEDTIME 04/29/22   Georgina Quint, MD  glucose blood test strip Use as instructed 06/01/22   Georgina Quint, MD  hydrocortisone cream 1 % Apply 1 application topically 2 (two) times daily.    [provider]  lisinopril (ZESTRIL) 40 MG tablet TAKE 1 TABLET BY MOUTH DAILY 10/09/22   Georgina Quint, MD  loratadine (CLARITIN) 10 MG tablet Take 10 mg by mouth daily.    [provider]  metoprolol succinate (TOPROL-XL) 50 MG 24 hr tablet TAKE 1 TABLET BY MOUTH TWICE  DAILY WITH OR IMMEDIATELY  FOLLOWING A MEAL 03/07/23   Georgina Quint, MD  Multiple Vitamin (MULTIVITAMIN) tablet Take 1 tablet by mouth daily.  08/30/16   Ardelle Balls, PA-C  nystatin cream (MYCOSTATIN) Apply 1 application topically as needed for dry skin. 04/28/20   Georgina Quint, MD  omeprazole (PRILOSEC) 20 MG capsule Take 1 capsule (20 mg total) by mouth daily. 12/15/16   Trena Platt D, PA  ondansetron (ZOFRAN-ODT) 8 MG disintegrating tablet DISSOLVE 1 TABLET ON TOP OF THE  TONGUE EVERY 12 HOURS AS NEEDED  FOR NAUSEA 10/10/22   Georgina Quint, MD  polycarbophil (FIBERCON) 625 MG tablet Take by mouth daily.    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) 06/09/2022   Chronic left shoulder pain 10/28/2021   Hyperlipidemia associated with type 2 diabetes mellitus (HCC) 12/10/2020   Trigger finger, right middle finger 11/07/2019   Hypertension associated with diabetes (HCC) 10/29/2019   Chronic heart failure with preserved ejection fraction (HFpEF) (HCC) 05/25/2019   Coronary artery disease involving native coronary artery of native heart with angina pectoris (HCC) 02/14/2018   Morbid obesity (HCC) 02/14/2018   Irritable bowel syndrome 06/28/2017   S/P CABG x 3 07/22/2016   Multiple vessel coronary artery disease 07/21/2016   Environmental allergies 04/26/2016   Sinus node dysfunction (HCC) 04/26/2016   Pacemaker Medtronic    OSA (obstructive sleep apnea) 05/25/2011   Hx of adenomatous colonic polyps 05/25/2011   Migraine 05/25/2011   Diabetes mellitus type 2, uncontrolled, with complications 05/25/2011   GERD (gastroesophageal reflux disease) 05/25/2011   NAFLD (nonalcoholic fatty liver disease) 16/07/9603   Hyperlipidemia LDL goal <70 05/25/2011   Essential hypertension 09/16/2009   DIASTOLIC HEART FAILURE, CHRONIC 09/16/2009    Past Medical History:  Diagnosis Date   Allergy    Anal fissure    Anxiety    Arthritis    Asthma    Cataract 2010    left   Colon polyps    diverticulosis  03-2011/2005   Depression    Diabetes mellitus 2012   T2DM   Diverticulosis    Fatty liver 04/2011   GERD  (gastroesophageal reflux disease)    Hyperlipidemia    Hypertension severe    Migraine    Obesity    OSA (obstructive sleep apnea)    C-Pap   Pacemaker Medtronic    Sinoatrial node dysfunction (HCC)    syncope    Past Surgical History:  Procedure Laterality Date   ABDOMINAL HYSTERECTOMY  2000   total   CARDIAC CATHETERIZATION N/A 07/20/2016   Procedure: Left Heart Cath and Coronary Angiography;  Surgeon: Peter M Swaz[place], MD;  Location: [facility] INVASIVE CV LAB;  Service: Cardiovascular;  Laterality: N/A;   CATARACT EXTRACTION Left    CORONARY ARTERY BYPASS GRAFT N/A 07/22/2016   Procedure: CORONARY ARTERY BYPASS GRAFTING (CABG) x three , using left internal mammary  artery and right leg greater saphenous vein harvested endoscopically;  Surgeon: Kerin Perna, MD;  Location: [facility] OR;  Service: Open Heart Surgery;  Laterality: N/A;   EYE SURGERY Right 2010   MULTIPLE TOOTH EXTRACTIONS     OOPHORECTOMY     PACEMAKER PLACEMENT     T/A right knee     TEE WITHOUT CARDIOVERSION N/A 07/22/2016   Procedure: TRANSESOPHAGEAL ECHOCARDIOGRAM (TEE);  Surgeon: Kerin Perna, MD;  Location: [facility] OR;  Service: Open Heart Surgery;  Laterality: N/A;   TONSILLECTOMY      Social History   Socioeconomic History   Marital status: Single    Spouse name: Not on file   Number of children: 0   Years of education: Not on file   Highest education level: Not on file  Occupational History   Occupation: chef    Employer: [employer]  Tobacco Use   Smoking status: Former    Types: Cigarettes    Quit date: 1995    Years since quitting: 29.5   Smokeless tobacco: Never   Tobacco comments:    Quit 20 yrs ago  Vaping Use   Vaping Use: Never used  Substance and Sexual Activity   Alcohol use: No    Alcohol/week: 0.0 standard drinks of alcohol   Drug use: No   Sexual activity: Yes    Birth control/protection: Post-menopausal, Other-see comments    Comment: Monogamous   Other Topics Concern    Not on file  Social History Narrative   Single. Exercise: No. Education: College.   Social Determinants of Health   Financial Resource Strain: Low Risk  (01/13/2023)   Overall Financial Resource Strain (CARDIA)    Difficulty of Paying Living Expenses: Not hard at all  Food Insecurity: No Food Insecurity (01/13/2023)   Hunger Vital Sign    Worried About Running Out of Food in the Last Year: Never true    Ran Out of Food in the Last Year: Never true  Transportation Needs: No Transportation Needs (01/13/2023)   PRAPARE - Administrator, Civil Service (Medical): No    Lack of Transportation (Non-Medical): No  Physical Activity: Sufficiently Active (01/13/2023)   Exercise Vital Sign    Days of Exercise per Week: 5 days    Minutes of Exercise per Session: 30 min  Stress: No Stress Concern Present (01/13/2023)   [employer] of Occupational Health - Occupational Stress Questionnaire    Feeling of Stress : Not at all  Social Connections: Socially Isolated (01/13/2023)   Social Connection and Isolation Panel [NHANES]    Frequency of Communication with Friends and Family: Twice a week    Frequency of Social Gatherings with Friends and Family: Twice a week    Attends Religious Services: Never    Database administrator or Organizations: No    Attends Banker Meetings: Never    Marital Status: Never married  Intimate Partner Violence: Not At Risk (01/13/2023)   Humiliation, Afraid, Rape, and Kick questionnaire    Fear of Current or Ex-Partner: No    Emotionally Abused: No    Physically Abused: No    Sexually Abused: No    Family History  Problem Relation Age of Onset   Heart disease Mother    Cancer Mother 73       lung   Breast cancer Mother 66   Heart disease Father    Heart attack Sister    Diabetes Sister  type 2   Atrial fibrillation Sister    Sleep apnea Sister    Heart attack Brother    Heart attack Brother    Cancer Maternal Grandmother         breast   Breast cancer Maternal Grandmother    Leukemia Paternal Grandmother        neoplast   Aneurysm Paternal Grandfather        abdominal (stomach)   Colon cancer Neg Hx      Review of Systems  Constitutional: Negative.  Negative for chills and fever.  HENT: Negative.  Negative for congestion and sore throat.   Respiratory: Negative.  Negative for cough and shortness of breath.   Cardiovascular: Negative.  Negative for chest pain and palpitations.  Gastrointestinal:  Negative for abdominal pain, nausea and vomiting.  Genitourinary: Negative.   Musculoskeletal:  Positive for joint pain.  Skin: Negative.  Negative for rash.  Neurological:  Negative for dizziness and headaches.  All other systems reviewed and are negative.   Today's Vitals   04/18/23 1335 04/18/23 1356  BP: (!) 160/92 130/70  Pulse: 60   Temp: 98.2 F (36.8 C)   TempSrc: Oral   SpO2: 96%   Weight: 200 lb 8 oz (90.9 kg)   Height: 5\' 2"  (1.575 m)    Body mass index is 36.67 kg/m.   Physical Exam Vitals reviewed.  Constitutional:      Appearance: Normal appearance.  HENT:     Head: Normocephalic.     Mouth/Throat:     Mouth: Mucous membranes are moist.     Pharynx: Oropharynx is clear.  Eyes:     Extraocular Movements: Extraocular movements intact.     Pupils: Pupils are equal, round, and reactive to light.  Cardiovascular:     Rate and Rhythm: Normal rate and regular rhythm.     Pulses: Normal pulses.     Heart sounds: Normal heart sounds.  Pulmonary:     Effort: Pulmonary effort is normal.     Breath sounds: Normal breath sounds.  Musculoskeletal:     Cervical back: No tenderness.     Right lower leg: No edema.     Left lower leg: No edema.     Comments: Right wrist: Mild tenderness or inflammation to ulnar side.  Full range of motion Right hand within normal limits  Lymphadenopathy:     Cervical: No cervical adenopathy.  Skin:    General: Skin is warm and dry.     Capillary Refill:  Capillary refill takes less than 2 seconds.     Comments: Subungual hematomas both big toes Most likely secondary to mild trauma.  Patient is on Plavix  Neurological:     General: No focal deficit present.     Mental Status: She is alert and oriented to person, place, and time.  Psychiatric:        Mood and Affect: Mood normal.        Behavior: Behavior normal.    Results for orders placed or performed in visit on 04/18/23 (from the past 24 hour(s))  CBC with Differential/Platelet     Status: Abnormal   Collection Time: 04/18/23  2:09 PM  Result Value Ref Range   WBC 3.6 (L) 4.0 - 10.5 K/uL   RBC 4.01 3.87 - 5.11 Mil/uL   Hemoglobin 13.0 12.0 - 15.0 g/dL   HCT 09.8 11.9 - 14.7 %   MCV 96.3 78.0 - 100.0 fl   MCHC 33.8 30.0 - 36.0  g/dL   RDW 62.9 52.8 - 41.3 %   Platelets 221.0 150.0 - 400.0 K/uL   Neutrophils Relative % 55.6 43.0 - 77.0 %   Lymphocytes Relative 30.9 12.0 - 46.0 %   Monocytes Relative 9.5 3.0 - 12.0 %   Eosinophils Relative 3.1 0.0 - 5.0 %   Basophils Relative 0.9 0.0 - 3.0 %   Neutro Abs 2.0 1.4 - 7.7 K/uL   Lymphs Abs 1.1 0.7 - 4.0 K/uL   Monocytes Absolute 0.3 0.1 - 1.0 K/uL   Eosinophils Absolute 0.1 0.0 - 0.7 K/uL   Basophils Absolute 0.0 0.0 - 0.1 K/uL  Comprehensive metabolic panel     Status: Abnormal   Collection Time: 04/18/23  2:09 PM  Result Value Ref Range   Sodium 141 135 - 145 mEq/L   Potassium 4.5 3.5 - 5.1 mEq/L   Chloride 101 96 - 112 mEq/L   CO2 31 19 - 32 mEq/L   Glucose, Bld 106 (H) 70 - 99 mg/dL   BUN 11 6 - 23 mg/dL   Creatinine, Ser 2.44 0.40 - 1.20 mg/dL   Total Bilirubin 0.5 0.2 - 1.2 mg/dL   Alkaline Phosphatase 92 39 - 117 U/L   AST 23 0 - 37 U/L   ALT 13 0 - 35 U/L   Total Protein 7.5 6.0 - 8.3 g/dL   Albumin 4.6 3.5 - 5.2 g/dL   GFR 01.02 >72.53 mL/min   Calcium 10.0 8.4 - 10.5 mg/dL  Hemoglobin G6Y     Status: None   Collection Time: 04/18/23  2:09 PM  Result Value Ref Range   Hgb A1c MFr Bld 6.0 4.6 - 6.5 %  Lipid  panel     Status: Abnormal   Collection Time: 04/18/23  2:09 PM  Result Value Ref Range   Cholesterol 129 0 - 200 mg/dL   Triglycerides 403.4 (H) 0.0 - 149.0 mg/dL   HDL 74.25 >95.63 mg/dL   VLDL 87.5 (H) 0.0 - 64.3 mg/dL   Total CHOL/HDL Ratio 3    NonHDL 88.17   Uric acid     Status: None   Collection Time: 04/18/23  2:09 PM  Result Value Ref Range   Uric Acid, Serum 6.0 2.4 - 7.0 mg/dL  LDL cholesterol, direct     Status: None   Collection Time: 04/18/23  2:09 PM  Result Value Ref Range   Direct LDL 47.0 mg/dL      ASSESSMENT & PLAN: A total of 44 minutes was spent with the patient and counseling/coordination of care regarding preparing for this visit, review of most recent office visit notes, review of most recent blood work results, review of all medications, education on nutrition, cardiovascular risks associated with hypertension and diabetes, prognosis, documentation, and need for follow-up  Problem List Items Addressed This Visit       Cardiovascular and Mediastinum   Coronary artery disease involving native coronary artery of native heart with angina pectoris (HCC)    Stable.  No recent use of nitroglycerin Continues metoprolol succinate 50 mg daily. Continues Plavix 75 mg daily      Chronic heart failure with preserved ejection fraction (HFpEF) (HCC)    No clinical findings of acute congestive heart failure Continues furosemide 20 mg daily along with metoprolol succinate 50 mg daily and lisinopril 40 mg daily      Hypertension associated with diabetes (HCC) - Primary    Well-controlled hypertension Continue metoprolol succinate 50 mg daily and lisinopril 40 mg daily  Well-controlled diabetes Continue weekly Trulicity 0.75 mg Cardiovascular risks associated with hypertension and diabetes discussed Diet and nutrition discussed      Relevant Orders   CBC with Differential/Platelet   Comprehensive metabolic panel   Hemoglobin A1c   Lipid panel     Endocrine    Hyperlipidemia associated with type 2 diabetes mellitus (HCC)    Chronic stable condition Continue atorvastatin 80 mg daily        Musculoskeletal and Integument   Acute gout of right wrist    Currently active Start colchicine 0.6 mg twice a day for 5 days      Relevant Medications   colchicine 0.6 MG tablet   Other Relevant Orders   Uric acid     Other   History of gout   Relevant Orders   Uric acid   Patient Instructions  Health Maintenance After Age 54 After age 56, you are at a higher risk for certain long-term diseases and infections as well as injuries from falls. Falls are a major cause of broken bones and head injuries in people who are older than age 37. Getting regular preventive care can help to keep you healthy and well. Preventive care includes getting regular testing and making lifestyle changes as recommended by your health care provider. Talk with your health care provider about: Which screenings and tests you should have. A screening is a test that checks for a disease when you have no symptoms. A diet and exercise plan that is right for you. What should I know about screenings and tests to prevent falls? Screening and testing are the best ways to find a health problem early. Early diagnosis and treatment give you the best chance of managing medical conditions that are common after age 75. Certain conditions and lifestyle choices may make you more likely to have a fall. Your health care provider may recommend: Regular vision checks. Poor vision and conditions such as cataracts can make you more likely to have a fall. If you wear glasses, make sure to get your prescription updated if your vision changes. Medicine review. Work with your health care provider to regularly review all of the medicines you are taking, including over-the-counter medicines. Ask your health care provider about any side effects that may make you more likely to have a fall. Tell your health care  provider if any medicines that you take make you feel dizzy or sleepy. Strength and balance checks. Your health care provider may recommend certain tests to check your strength and balance while standing, walking, or changing positions. Foot health exam. Foot pain and numbness, as well as not wearing proper footwear, can make you more likely to have a fall. Screenings, including: Osteoporosis screening. Osteoporosis is a condition that causes the bones to get weaker and break more easily. Blood pressure screening. Blood pressure changes and medicines to control blood pressure can make you feel dizzy. Depression screening. You may be more likely to have a fall if you have a fear of falling, feel depressed, or feel unable to do activities that you used to do. Alcohol use screening. Using too much alcohol can affect your balance and may make you more likely to have a fall. Follow these instructions at home: Lifestyle Do not drink alcohol if: Your health care provider tells you not to drink. If you drink alcohol: Limit how much you have to: 0-1 drink a day for women. 0-2 drinks a day for men. Know how much alcohol is in your  drink. In the [place] one drink equals one 12 oz bottle of beer (355 mL), one 5 oz glass of wine (148 mL), or one 1 oz glass of hard liquor (44 mL). Do not use any products that contain nicotine or tobacco. These products include cigarettes, chewing tobacco, and vaping devices, such as e-cigarettes. If you need help quitting, ask your health care provider. Activity  Follow a regular exercise program to stay fit. This will help you maintain your balance. Ask your health care provider what types of exercise are appropriate for you. If you need a cane or walker, use it as recommended by your health care provider. Wear supportive shoes that have nonskid soles. Safety  Remove any tripping hazards, such as rugs, cords, and clutter. Install safety equipment such as grab bars in  bathrooms and safety rails on stairs. Keep rooms and walkways well-lit. General instructions Talk with your health care provider about your risks for falling. Tell your health care provider if: You fall. Be sure to tell your health care provider about all falls, even ones that seem minor. You feel dizzy, tiredness (fatigue), or off-balance. Take over-the-counter and prescription medicines only as told by your health care provider. These include supplements. Eat a healthy diet and maintain a healthy weight. A healthy diet includes low-fat dairy products, low-fat (lean) meats, and fiber from whole grains, beans, and lots of fruits and vegetables. Stay current with your vaccines. Schedule regular health, dental, and eye exams. Summary Having a healthy lifestyle and getting preventive care can help to protect your health and wellness after age 37. Screening and testing are the best way to find a health problem early and help you avoid having a fall. Early diagnosis and treatment give you the best chance for managing medical conditions that are more common for people who are older than age 15. Falls are a major cause of broken bones and head injuries in people who are older than age 84. Take precautions to prevent a fall at home. Work with your health care provider to learn what changes you can make to improve your health and wellness and to prevent falls. This information is not intended to replace advice given to you by your health care provider. Make sure you discuss any questions you have with your health care provider. Document Revised: 02/16/2021 Document Reviewed: 02/16/2021 Elsevier Patient Education  2024 Elsevier Inc.    Edwina Barth, MD  Primary Care at [facility]

## 2023-04-18 NOTE — Assessment & Plan Note (Signed)
Currently active Start colchicine 0.6 mg twice a day for 5 days

## 2023-04-18 NOTE — Assessment & Plan Note (Signed)
Stable.  No recent use of nitroglycerin Continues metoprolol succinate 50 mg daily. Continues Plavix 75 mg daily

## 2023-04-18 NOTE — Assessment & Plan Note (Signed)
Chronic stable condition Continue atorvastatin 80 mg daily. 

## 2023-05-05 ENCOUNTER — Other Ambulatory Visit: Payer: Self-pay | Admitting: Emergency Medicine

## 2023-05-05 DIAGNOSIS — G629 Polyneuropathy, unspecified: Secondary | ICD-10-CM

## 2023-05-05 DIAGNOSIS — E1142 Type 2 diabetes mellitus with diabetic polyneuropathy: Secondary | ICD-10-CM

## 2023-06-04 ENCOUNTER — Other Ambulatory Visit: Payer: Self-pay | Admitting: Internal Medicine

## 2023-06-10 ENCOUNTER — Ambulatory Visit (INDEPENDENT_AMBULATORY_CARE_PROVIDER_SITE_OTHER): Payer: Medicare Other

## 2023-06-10 DIAGNOSIS — I495 Sick sinus syndrome: Secondary | ICD-10-CM

## 2023-06-14 LAB — CUP PACEART REMOTE DEVICE CHECK
Battery Impedance: 2247 Ohm
Battery Remaining Longevity: 39 mo
Battery Voltage: 2.73 V
Brady Statistic AP VP Percent: 0 %
Brady Statistic AP VS Percent: 0 %
Brady Statistic AS VP Percent: 1 %
Brady Statistic AS VS Percent: 99 %
Date Time Interrogation Session: 20240830200633
Implantable Lead Connection Status: 753985
Implantable Lead Connection Status: 753985
Implantable Lead Implant Date: 20091201
Implantable Lead Implant Date: 20091201
Implantable Lead Location: 753859
Implantable Lead Location: 753860
Implantable Lead Model: 5076
Implantable Lead Model: 5076
Implantable Pulse Generator Implant Date: 20091201
Lead Channel Impedance Value: 426 Ohm
Lead Channel Impedance Value: 533 Ohm
Lead Channel Pacing Threshold Amplitude: 0.5 V
Lead Channel Pacing Threshold Amplitude: 0.75 V
Lead Channel Pacing Threshold Pulse Width: 0.4 ms
Lead Channel Pacing Threshold Pulse Width: 0.4 ms
Lead Channel Setting Pacing Amplitude: 2 V
Lead Channel Setting Pacing Amplitude: 2.5 V
Lead Channel Setting Pacing Pulse Width: 0.4 ms
Lead Channel Setting Sensing Sensitivity: 2 mV
Zone Setting Status: 755011
Zone Setting Status: 755011

## 2023-06-14 NOTE — Progress Notes (Signed)
Remote pacemaker transmission.   

## 2023-06-21 ENCOUNTER — Encounter: Payer: Self-pay | Admitting: Internal Medicine

## 2023-06-21 ENCOUNTER — Ambulatory Visit: Payer: Medicare Other | Attending: Internal Medicine | Admitting: Internal Medicine

## 2023-06-21 VITALS — BP 140/88 | HR 60 | Ht 62.0 in | Wt 200.0 lb

## 2023-06-21 DIAGNOSIS — I5032 Chronic diastolic (congestive) heart failure: Secondary | ICD-10-CM

## 2023-06-21 DIAGNOSIS — I2510 Atherosclerotic heart disease of native coronary artery without angina pectoris: Secondary | ICD-10-CM | POA: Diagnosis not present

## 2023-06-21 DIAGNOSIS — I480 Paroxysmal atrial fibrillation: Secondary | ICD-10-CM

## 2023-06-21 DIAGNOSIS — Z950 Presence of cardiac pacemaker: Secondary | ICD-10-CM

## 2023-06-21 DIAGNOSIS — I495 Sick sinus syndrome: Secondary | ICD-10-CM

## 2023-06-21 MED ORDER — FUROSEMIDE 20 MG PO TABS: 40.0000 mg | ORAL_TABLET | Freq: Every day | ORAL | 2 refills | Status: DC

## 2023-06-21 NOTE — Patient Instructions (Signed)
Medication Instructions:  Increase Furosemide to 40 mg daily   *If you need a refill on your cardiac medications before your next appointment, please call your pharmacy*   Lab Work: Your provider would like for you to have following labs drawn in 2 weeks- BMET.   If you have labs (blood work) drawn today and your tests are completely normal, you will receive your results only by: MyChart Message (if you have MyChart) OR A paper copy in the mail If you have any lab test that is abnormal or we need to change your treatment, we will call you to review the results.   Follow-Up: At [facility], you and your health needs are our priority.  As part of our continuing mission to provide you with exceptional heart care, we have created designated Provider Care Teams.  These Care Teams include your primary Cardiologist (physician) and Advanced Practice Providers (APPs -  Physician Assistants and Nurse Practitioners) who all work together to provide you with the care you need, when you need it.  We recommend signing up for the patient portal called "MyChart".  Sign up information is provided on this After Visit Summary.  MyChart is used to connect with patients for Virtual Visits (Telemedicine).  Patients are able to view lab/test results, encounter notes, upcoming appointments, etc.  Non-urgent messages can be sent to your provider as well.   To learn more about what you can do with MyChart, go to [email or web address].    Your next appointment:   12 month(s)  Provider:   Sherryl Manges, MD

## 2023-06-21 NOTE — Progress Notes (Signed)
Patient Care Team: Georgina Quint, MD as PCP - General (Internal Medicine) End, Cristal Deer, MD as PCP - Cardiology (Cardiology) Himmelrich, Loree Fee, RD (Inactive) as Dietitian Duke Salvia, MD as Attending Physician (Cardiology) Janet Berlin, MD as Consulting Physician (Ophthalmology) Dannielle Huh, MD as Consulting Physician (Orthopedic Surgery)   HPI  [name] [name] is a 67 y.o. female seen in follow-up for pacemaker implantation-Medtronic for  sinus node dysfunction. She paces almost never.   10/17 STEMI  Cath>>  Severe 3 vessel obstructive CAD   . Coronary artery bypass grafting x3 (left internal mammary artery to     LAD, saphenous vein graft to diagonal, saphenous vein graft to     obtuse marginal).   The patient denies chest pain, s nocturnal dyspnea, orthopnea.  There have been no palpitations, lightheadedness or syncope.  Some shortness of breath but improved on a diuretic started by Dr. Okey Dupre.  Does not urinate very briskly however.  Blood pressure is also been on the higher side since the discontinuation of hydrochlorothiazide.  Some peripheral edema  Lost from 230-195 and has been stable in the 195 200 range for the last 18 months although her pant size continues to shrink  DATE TEST EF   10/17 TEE 60-65%   9/22 Myoview LV function normal No ischemia    Date Cr K Hgb LDL  7/21 0.97 4.7  24   7/23 1.15 5.0  60  7/24 0.94 4.5 13.0 47        Past Medical History:  Diagnosis Date   Allergy    Anal fissure    Anxiety    Arthritis    Asthma    Cataract 2010    left   Colon polyps    diverticulosis  03-2011/2005   Depression    Diabetes mellitus 2012   T2DM   Diverticulosis    Fatty liver 04/2011   GERD (gastroesophageal reflux disease)    Hyperlipidemia    Hypertension severe    Migraine    Obesity    OSA (obstructive sleep apnea)    C-Pap   Pacemaker Medtronic    Sinoatrial node dysfunction (HCC)    syncope    Past Surgical  History:  Procedure Laterality Date   ABDOMINAL HYSTERECTOMY  2000   total   CARDIAC CATHETERIZATION N/A 07/20/2016   Procedure: Left Heart Cath and Coronary Angiography;  Surgeon: Peter M Swaziland, MD;  Location: [facility] INVASIVE CV LAB;  Service: Cardiovascular;  Laterality: N/A;   CATARACT EXTRACTION Left    CORONARY ARTERY BYPASS GRAFT N/A 07/22/2016   Procedure: CORONARY ARTERY BYPASS GRAFTING (CABG) x three , using left internal mammary artery and right leg greater saphenous vein harvested endoscopically;  Surgeon: Kerin Perna, MD;  Location: [facility] OR;  Service: Open Heart Surgery;  Laterality: N/A;   EYE SURGERY Right 2010   MULTIPLE TOOTH EXTRACTIONS     OOPHORECTOMY     PACEMAKER PLACEMENT     T/A right knee     TEE WITHOUT CARDIOVERSION N/A 07/22/2016   Procedure: TRANSESOPHAGEAL ECHOCARDIOGRAM (TEE);  Surgeon: Kerin Perna, MD;  Location: [facility] OR;  Service: Open Heart Surgery;  Laterality: N/A;   TONSILLECTOMY      Current Outpatient Medications  Medication Sig Dispense Refill   Accu-Chek Softclix Lancets lancets USE AS DIRECTED 200 each 3   albuterol (VENTOLIN HFA) 108 (90 Base) MCG/ACT inhaler Inhale 1-2 puffs into the lungs every 6 (six) hours as needed for  wheezing or shortness of breath. 6.7 g 3   Alirocumab (PRALUENT) 75 MG/ML SOAJ INJECT 75 MG SUBCUTANEOUSLY EVERY 14 DAYS 2 mL 3   allopurinol (ZYLOPRIM) 100 MG tablet Take 1 tablet (100 mg total) by mouth daily. 90 tablet 3   atorvastatin (LIPITOR) 80 MG tablet TAKE 1 TABLET BY MOUTH ONCE  DAILY AT 6 PM 100 tablet 2   Blood Glucose Monitoring Suppl (ACCU-CHEK GUIDE) w/Device KIT 1 Device by Does not apply route in the morning, at noon, and at bedtime. 1 kit 0   cetirizine (ZYRTEC) 10 MG tablet Take 10 mg by mouth daily.     citalopram (CELEXA) 20 MG tablet TAKE 1 TABLET BY MOUTH DAILY 90 tablet 3   clopidogrel (PLAVIX) 75 MG tablet TAKE 1 TABLET BY MOUTH DAILY 90 tablet 2   colchicine 0.6 MG tablet Take 1 tablet (0.6 mg  total) by mouth 2 (two) times daily for 5 days. (Patient taking differently: Take 0.6 mg by mouth as needed (gout).) 30 tablet 1   dicyclomine (BENTYL) 20 MG tablet Take 20 mg by mouth 3 (three) times daily.     Dulaglutide (TRULICITY) 0.75 MG/0.5ML SOPN INJECT 0.75MG  (0.5ML) UNDER THE SKIN ONCE A WEEK. 6 mL 1   fluticasone (FLONASE) 50 MCG/ACT nasal spray USE 2 SPRAYS IN BOTH NOSTRILS  DAILY 48 g 0   fluticasone (FLOVENT HFA) 110 MCG/ACT inhaler Inhale 2 puffs into the lungs 2 (two) times daily as needed.     furosemide (LASIX) 20 MG tablet TAKE 1 TABLET BY MOUTH DAILY AS  NEEDED FOR EDEMA SWELLING 100 tablet 2   gabapentin (NEURONTIN) 300 MG capsule TAKE 2 CAPSULES BY MOUTH IN THE  MORNING AND 3 CAPSULES BY MOUTH  AT BEDTIME 500 capsule 1   glucose blood test strip Use as instructed 200 each 5   hydrocortisone cream 1 % Apply 1 application topically 2 (two) times daily.     lisinopril (ZESTRIL) 40 MG tablet TAKE 1 TABLET BY MOUTH DAILY 100 tablet 2   loratadine (CLARITIN) 10 MG tablet Take 10 mg by mouth daily.     metoprolol succinate (TOPROL-XL) 50 MG 24 hr tablet TAKE 1 TABLET BY MOUTH TWICE  DAILY WITH OR IMMEDIATELY  FOLLOWING A MEAL 200 tablet 2   Multiple Vitamin (MULTIVITAMIN) tablet Take 1 tablet by mouth daily.     nystatin cream (MYCOSTATIN) Apply 1 application topically as needed for dry skin. 30 g 1   omeprazole (PRILOSEC) 20 MG capsule Take 1 capsule (20 mg total) by mouth daily. 90 capsule 1   ondansetron (ZOFRAN-ODT) 8 MG disintegrating tablet DISSOLVE 1 TABLET ON TOP OF THE  TONGUE EVERY 12 HOURS AS NEEDED  FOR NAUSEA 120 tablet 1   polycarbophil (FIBERCON) 625 MG tablet Take by mouth daily.     traMADol (ULTRAM) 50 MG tablet TAKE 1 TABLET BY MOUTH AT  BEDTIME AS NEEDED 30 tablet 1   triamcinolone cream (KENALOG) 0.1 % APPLY 1 APPLICATION TOPICALLY  DAILY AS NEEDED 60 g 1   No current facility-administered medications for this visit.    Allergies  Allergen Reactions    Aspirin Other (See Comments)    Sores in mouth Sores in mouth   Adhesive [Tape] Other (See Comments)    SKIN BLISTERS   Penicillins     Swelling    Red Yeast Rice Hives   Sulfa Drugs Cross Reactors Hives    Review of Systems negative except from HPI and PMH  Physical Exam  BP (!) 140/88   Pulse 60   Ht 5\' 2"  (1.575 m)   Wt 200 lb (90.7 kg)   SpO2 95%   BMI 36.58 kg/m  Well developed and well nourished in no acute distress HENT normal Neck supple with JVP-flat Clear Device pocket well healed; without hematoma or erythema.  There is no tethering  Regular rate and rhythm, no  gallop No murmur Abd-soft with active BS No Clubbing cyanosis 1+ edema Skin-warm and dry A & Oriented  Grossly normal sensory and motor function  ECG sinus at 60 Intervals 23/12/47  Device function is normal. 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  She is volume overloaded.  Will increase her furosemide from 20-40 and work on decreasing salt intake and transitioning her salt to a potassium based salt supplement (typically 20 or so percent potassium) may also help get her blood pressure in range.  She is treated sleep apnea.  But is doing some snoring I suggested she reach out to her sleep apnea team about this.  Interestingly, the bifascicular block evident 7/23 is no longer evident.  She is volume overloaded.  As above we will increase her diuretic and would ask  her to discuss with her primary care physician about an SGLT2 as part of her diabetic regime for her HFpEF   No interval atrial fibrillation of which she is aware.   Continue Plavix and Praluent for her ischemic heart  disease

## 2023-06-25 ENCOUNTER — Other Ambulatory Visit: Payer: Self-pay | Admitting: Emergency Medicine

## 2023-06-27 ENCOUNTER — Other Ambulatory Visit: Payer: Self-pay | Admitting: Emergency Medicine

## 2023-06-27 DIAGNOSIS — D23112 Other benign neoplasm of skin of right lower eyelid, including canthus: Secondary | ICD-10-CM | POA: Diagnosis not present

## 2023-06-27 DIAGNOSIS — I2510 Atherosclerotic heart disease of native coronary artery without angina pectoris: Secondary | ICD-10-CM

## 2023-06-27 DIAGNOSIS — H524 Presbyopia: Secondary | ICD-10-CM | POA: Diagnosis not present

## 2023-06-27 DIAGNOSIS — E119 Type 2 diabetes mellitus without complications: Secondary | ICD-10-CM | POA: Diagnosis not present

## 2023-06-27 DIAGNOSIS — H52203 Unspecified astigmatism, bilateral: Secondary | ICD-10-CM | POA: Diagnosis not present

## 2023-06-27 DIAGNOSIS — H43813 Vitreous degeneration, bilateral: Secondary | ICD-10-CM | POA: Diagnosis not present

## 2023-06-27 LAB — HM DIABETES EYE EXAM

## 2023-07-06 DIAGNOSIS — I5032 Chronic diastolic (congestive) heart failure: Secondary | ICD-10-CM | POA: Diagnosis not present

## 2023-07-06 DIAGNOSIS — I480 Paroxysmal atrial fibrillation: Secondary | ICD-10-CM | POA: Diagnosis not present

## 2023-07-06 DIAGNOSIS — Z950 Presence of cardiac pacemaker: Secondary | ICD-10-CM | POA: Diagnosis not present

## 2023-07-06 DIAGNOSIS — I2510 Atherosclerotic heart disease of native coronary artery without angina pectoris: Secondary | ICD-10-CM | POA: Diagnosis not present

## 2023-07-06 DIAGNOSIS — I495 Sick sinus syndrome: Secondary | ICD-10-CM | POA: Diagnosis not present

## 2023-07-07 LAB — BASIC METABOLIC PANEL
BUN/Creatinine Ratio: 17 (ref 12–28)
BUN: 19 mg/dL (ref 8–27)
CO2: 24 mmol/L (ref 20–29)
Calcium: 9.2 mg/dL (ref 8.7–10.3)
Chloride: 102 mmol/L (ref 96–106)
Creatinine, Ser: 1.1 mg/dL — ABNORMAL HIGH (ref 0.57–1.00)
Glucose: 159 mg/dL — ABNORMAL HIGH (ref 70–99)
Potassium: 4 mmol/L (ref 3.5–5.2)
Sodium: 142 mmol/L (ref 134–144)
eGFR: 54 mL/min/{1.73_m2} — ABNORMAL LOW (ref >59)

## 2023-08-04 ENCOUNTER — Other Ambulatory Visit: Payer: Self-pay | Admitting: Internal Medicine

## 2023-08-07 ENCOUNTER — Other Ambulatory Visit: Payer: Self-pay | Admitting: Emergency Medicine

## 2023-08-07 DIAGNOSIS — E1142 Type 2 diabetes mellitus with diabetic polyneuropathy: Secondary | ICD-10-CM

## 2023-08-11 ENCOUNTER — Other Ambulatory Visit: Payer: Self-pay | Admitting: Emergency Medicine

## 2023-08-11 DIAGNOSIS — Z1231 Encounter for screening mammogram for malignant neoplasm of breast: Secondary | ICD-10-CM

## 2023-08-16 ENCOUNTER — Other Ambulatory Visit: Payer: Self-pay | Admitting: *Deleted

## 2023-08-16 DIAGNOSIS — E1142 Type 2 diabetes mellitus with diabetic polyneuropathy: Secondary | ICD-10-CM

## 2023-08-16 MED ORDER — TRULICITY 0.75 MG/0.5ML ~~LOC~~ SOAJ: SUBCUTANEOUS | 0 refills | Status: DC

## 2023-08-20 ENCOUNTER — Other Ambulatory Visit: Payer: Self-pay | Admitting: Emergency Medicine

## 2023-08-20 DIAGNOSIS — G43109 Migraine with aura, not intractable, without status migrainosus: Secondary | ICD-10-CM

## 2023-08-31 DIAGNOSIS — K580 Irritable bowel syndrome with diarrhea: Secondary | ICD-10-CM | POA: Diagnosis not present

## 2023-08-31 DIAGNOSIS — R1310 Dysphagia, unspecified: Secondary | ICD-10-CM | POA: Diagnosis not present

## 2023-09-09 ENCOUNTER — Ambulatory Visit (INDEPENDENT_AMBULATORY_CARE_PROVIDER_SITE_OTHER): Payer: Medicare Other

## 2023-09-09 DIAGNOSIS — I495 Sick sinus syndrome: Secondary | ICD-10-CM | POA: Diagnosis not present

## 2023-09-10 LAB — CUP PACEART REMOTE DEVICE CHECK
Battery Impedance: 2434 Ohm
Battery Remaining Longevity: 36 mo
Battery Voltage: 2.73 V
Brady Statistic AP VP Percent: 0 %
Brady Statistic AP VS Percent: 0 %
Brady Statistic AS VP Percent: 1 %
Brady Statistic AS VS Percent: 99 %
Date Time Interrogation Session: 20241129200711
Implantable Lead Connection Status: 753985
Implantable Lead Connection Status: 753985
Implantable Lead Implant Date: 20091201
Implantable Lead Implant Date: 20091201
Implantable Lead Location: 753859
Implantable Lead Location: 753860
Implantable Lead Model: 5076
Implantable Lead Model: 5076
Implantable Pulse Generator Implant Date: 20091201
Lead Channel Impedance Value: 419 Ohm
Lead Channel Impedance Value: 540 Ohm
Lead Channel Pacing Threshold Amplitude: 0.5 V
Lead Channel Pacing Threshold Amplitude: 0.625 V
Lead Channel Pacing Threshold Pulse Width: 0.4 ms
Lead Channel Pacing Threshold Pulse Width: 0.4 ms
Lead Channel Setting Pacing Amplitude: 2 V
Lead Channel Setting Pacing Amplitude: 2.5 V
Lead Channel Setting Pacing Pulse Width: 0.4 ms
Lead Channel Setting Sensing Sensitivity: 2.8 mV
Zone Setting Status: 755011
Zone Setting Status: 755011

## 2023-09-16 ENCOUNTER — Other Ambulatory Visit: Payer: Self-pay | Admitting: Internal Medicine

## 2023-09-23 ENCOUNTER — Other Ambulatory Visit: Payer: Self-pay | Admitting: Emergency Medicine

## 2023-09-23 DIAGNOSIS — Z9109 Other allergy status, other than to drugs and biological substances: Secondary | ICD-10-CM

## 2023-09-28 ENCOUNTER — Ambulatory Visit: Payer: Medicare Other

## 2023-09-28 DIAGNOSIS — Z1231 Encounter for screening mammogram for malignant neoplasm of breast: Secondary | ICD-10-CM

## 2023-09-30 NOTE — Progress Notes (Signed)
Remote pacemaker transmission.   

## 2023-09-30 NOTE — Addendum Note (Signed)
Addended by: Elease Etienne A on: 09/30/2023 01:52 PM   Modules accepted: Orders

## 2023-10-03 ENCOUNTER — Other Ambulatory Visit: Payer: Self-pay | Admitting: Emergency Medicine

## 2023-10-03 DIAGNOSIS — F419 Anxiety disorder, unspecified: Secondary | ICD-10-CM

## 2023-10-19 ENCOUNTER — Telehealth: Payer: Self-pay | Admitting: Emergency Medicine

## 2023-10-19 ENCOUNTER — Ambulatory Visit: Payer: Medicare Other | Admitting: Emergency Medicine

## 2023-10-19 ENCOUNTER — Encounter: Payer: Self-pay | Admitting: Emergency Medicine

## 2023-10-19 VITALS — BP 122/82 | HR 63 | Temp 98.1°F | Ht 62.0 in | Wt 192.0 lb

## 2023-10-19 DIAGNOSIS — E785 Hyperlipidemia, unspecified: Secondary | ICD-10-CM | POA: Diagnosis not present

## 2023-10-19 DIAGNOSIS — I152 Hypertension secondary to endocrine disorders: Secondary | ICD-10-CM | POA: Diagnosis not present

## 2023-10-19 DIAGNOSIS — B370 Candidal stomatitis: Secondary | ICD-10-CM | POA: Diagnosis not present

## 2023-10-19 DIAGNOSIS — I25119 Atherosclerotic heart disease of native coronary artery with unspecified angina pectoris: Secondary | ICD-10-CM

## 2023-10-19 DIAGNOSIS — I495 Sick sinus syndrome: Secondary | ICD-10-CM

## 2023-10-19 DIAGNOSIS — Z8739 Personal history of other diseases of the musculoskeletal system and connective tissue: Secondary | ICD-10-CM

## 2023-10-19 DIAGNOSIS — K130 Diseases of lips: Secondary | ICD-10-CM | POA: Insufficient documentation

## 2023-10-19 DIAGNOSIS — G4733 Obstructive sleep apnea (adult) (pediatric): Secondary | ICD-10-CM | POA: Diagnosis not present

## 2023-10-19 DIAGNOSIS — E1169 Type 2 diabetes mellitus with other specified complication: Secondary | ICD-10-CM | POA: Diagnosis not present

## 2023-10-19 DIAGNOSIS — E1159 Type 2 diabetes mellitus with other circulatory complications: Secondary | ICD-10-CM

## 2023-10-19 DIAGNOSIS — K219 Gastro-esophageal reflux disease without esophagitis: Secondary | ICD-10-CM

## 2023-10-19 DIAGNOSIS — I5032 Chronic diastolic (congestive) heart failure: Secondary | ICD-10-CM

## 2023-10-19 LAB — COMPREHENSIVE METABOLIC PANEL
ALT: 13 U/L (ref 0–35)
AST: 22 U/L (ref 0–37)
Albumin: 4.7 g/dL (ref 3.5–5.2)
Alkaline Phosphatase: 88 U/L (ref 39–117)
BUN: 15 mg/dL (ref 6–23)
CO2: 32 meq/L (ref 19–32)
Calcium: 9.8 mg/dL (ref 8.4–10.5)
Chloride: 102 meq/L (ref 96–112)
Creatinine, Ser: 0.86 mg/dL (ref 0.40–1.20)
GFR: 68.87 mL/min (ref >60.00)
Glucose, Bld: 123 mg/dL — ABNORMAL HIGH (ref 70–99)
Potassium: 4 meq/L (ref 3.5–5.1)
Sodium: 141 meq/L (ref 135–145)
Total Bilirubin: 0.5 mg/dL (ref 0.2–1.2)
Total Protein: 7.4 g/dL (ref 6.0–8.3)

## 2023-10-19 LAB — CBC WITH DIFFERENTIAL/PLATELET
Basophils Absolute: 0 10*3/uL (ref 0.0–0.1)
Basophils Relative: 0.7 % (ref 0.0–3.0)
Eosinophils Absolute: 0.1 10*3/uL (ref 0.0–0.7)
Eosinophils Relative: 1.7 % (ref 0.0–5.0)
HCT: 39.7 % (ref 36.0–46.0)
Hemoglobin: 13.5 g/dL (ref 12.0–15.0)
Lymphocytes Relative: 24.4 % (ref 12.0–46.0)
Lymphs Abs: 1.2 10*3/uL (ref 0.7–4.0)
MCHC: 34.1 g/dL (ref 30.0–36.0)
MCV: 95.7 fL (ref 78.0–100.0)
Monocytes Absolute: 0.4 10*3/uL (ref 0.1–1.0)
Monocytes Relative: 8.5 % (ref 3.0–12.0)
Neutro Abs: 3.2 10*3/uL (ref 1.4–7.7)
Neutrophils Relative %: 64.7 % (ref 43.0–77.0)
Platelets: 223 10*3/uL (ref 150.0–400.0)
RBC: 4.15 Mil/uL (ref 3.87–5.11)
RDW: 14.3 % (ref 11.5–15.5)
WBC: 4.9 10*3/uL (ref 4.0–10.5)

## 2023-10-19 LAB — HEMOGLOBIN A1C: Hgb A1c MFr Bld: 6.1 % (ref 4.6–6.5)

## 2023-10-19 LAB — LIPID PANEL
Cholesterol: 133 mg/dL (ref 0–200)
HDL: 44.1 mg/dL (ref >39.00)
LDL Cholesterol: 39 mg/dL (ref 0–99)
NonHDL: 88.52
Total CHOL/HDL Ratio: 3
Triglycerides: 247 mg/dL — ABNORMAL HIGH (ref 0.0–149.0)
VLDL: 49.4 mg/dL — ABNORMAL HIGH (ref 0.0–40.0)

## 2023-10-19 LAB — MICROALBUMIN / CREATININE URINE RATIO
Creatinine,U: 57.7 mg/dL
Microalb Creat Ratio: 6.3 mg/g (ref 0.0–30.0)
Microalb, Ur: 3.7 mg/dL — ABNORMAL HIGH (ref 0.0–1.9)

## 2023-10-19 LAB — URIC ACID: Uric Acid, Serum: 6 mg/dL (ref 2.4–7.0)

## 2023-10-19 MED ORDER — NYSTATIN 100000 UNIT/ML MT SUSP: 5.0000 mL | Freq: Four times a day (QID) | OROMUCOSAL | 0 refills | Status: DC

## 2023-10-19 MED ORDER — FLUCONAZOLE 150 MG PO TABS: 150.0000 mg | ORAL_TABLET | Freq: Every day | ORAL | 0 refills | Status: DC

## 2023-10-19 NOTE — Assessment & Plan Note (Signed)
Chronic stable condition Continue atorvastatin 80 mg daily. 

## 2023-10-19 NOTE — Assessment & Plan Note (Signed)
Permanent pacemaker working well.

## 2023-10-19 NOTE — Assessment & Plan Note (Signed)
No clinical findings of acute congestive heart failure Continues furosemide 20 mg daily along with metoprolol succinate 50 mg daily and lisinopril 40 mg daily

## 2023-10-19 NOTE — Assessment & Plan Note (Signed)
 Stable. Asymptomatic.

## 2023-10-19 NOTE — Assessment & Plan Note (Signed)
Stable.  No recent use of nitroglycerin Continues metoprolol succinate 50 mg daily. Continues Plavix 75 mg daily

## 2023-10-19 NOTE — Assessment & Plan Note (Signed)
Well-controlled hypertension Continue metoprolol succinate 50 mg daily and lisinopril 40 mg daily Well-controlled diabetes Continue weekly Trulicity 0.75 mg Cardiovascular risks associated with hypertension and diabetes discussed Diet and nutrition discussed

## 2023-10-19 NOTE — Telephone Encounter (Signed)
Contacted pharmacy and clarified medication.

## 2023-10-19 NOTE — Assessment & Plan Note (Signed)
 Continues to lose weight Eating better Diet and nutrition discussed

## 2023-10-19 NOTE — Telephone Encounter (Signed)
 Copied from CRM [id number]. Topic: Clinical - Prescription Issue >> Oct 19, 2023 10:57 AM Denese Killings wrote: Reason for CRM: Hawa with [facility] is calling to get clarity on Patient prescription for fluconazole (DIFLUCAN) 150 MG tablet.

## 2023-10-19 NOTE — Patient Instructions (Signed)
 Health Maintenance After Age 70 After age 27, you are at a higher risk for certain long-term diseases and infections as well as injuries from falls. Falls are a major cause of broken bones and head injuries in people who are older than age 73. Getting regular preventive care can help to keep you healthy and well. Preventive care includes getting regular testing and making lifestyle changes as recommended by your health care provider. Talk with your health care provider about: Which screenings and tests you should have. A screening is a test that checks for a disease when you have no symptoms. A diet and exercise plan that is right for you. What should I know about screenings and tests to prevent falls? Screening and testing are the best ways to find a health problem early. Early diagnosis and treatment give you the best chance of managing medical conditions that are common after age 90. Certain conditions and lifestyle choices may make you more likely to have a fall. Your health care provider may recommend: Regular vision checks. Poor vision and conditions such as cataracts can make you more likely to have a fall. If you wear glasses, make sure to get your prescription updated if your vision changes. Medicine review. Work with your health care provider to regularly review all of the medicines you are taking, including over-the-counter medicines. Ask your health care provider about any side effects that may make you more likely to have a fall. Tell your health care provider if any medicines that you take make you feel dizzy or sleepy. Strength and balance checks. Your health care provider may recommend certain tests to check your strength and balance while standing, walking, or changing positions. Foot health exam. Foot pain and numbness, as well as not wearing proper footwear, can make you more likely to have a fall. Screenings, including: Osteoporosis screening. Osteoporosis is a condition that causes  the bones to get weaker and break more easily. Blood pressure screening. Blood pressure changes and medicines to control blood pressure can make you feel dizzy. Depression screening. You may be more likely to have a fall if you have a fear of falling, feel depressed, or feel unable to do activities that you used to do. Alcohol  use screening. Using too much alcohol  can affect your balance and may make you more likely to have a fall. Follow these instructions at home: Lifestyle Do not drink alcohol  if: Your health care provider tells you not to drink. If you drink alcohol : Limit how much you have to: 0-1 drink a day for women. 0-2 drinks a day for men. Know how much alcohol  is in your drink. In the [place] one drink equals one 12 oz bottle of beer (355 mL), one 5 oz glass of wine (148 mL), or one 1 oz glass of hard liquor (44 mL). Do not use any products that contain nicotine or tobacco. These products include cigarettes, chewing tobacco, and vaping devices, such as e-cigarettes. If you need help quitting, ask your health care provider. Activity  Follow a regular exercise program to stay fit. This will help you maintain your balance. Ask your health care provider what types of exercise are appropriate for you. If you need a cane or walker, use it as recommended by your health care provider. Wear supportive shoes that have nonskid soles. Safety  Remove any tripping hazards, such as rugs, cords, and clutter. Install safety equipment such as grab bars in bathrooms and safety rails on stairs. Keep rooms and walkways  well-lit. General instructions Talk with your health care provider about your risks for falling. Tell your health care provider if: You fall. Be sure to tell your health care provider about all falls, even ones that seem minor. You feel dizzy, tiredness (fatigue), or off-balance. Take over-the-counter and prescription medicines only as told by your health care provider. These include  supplements. Eat a healthy diet and maintain a healthy weight. A healthy diet includes low-fat dairy products, low-fat (lean) meats, and fiber from whole grains, beans, and lots of fruits and vegetables. Stay current with your vaccines. Schedule regular health, dental, and eye exams. Summary Having a healthy lifestyle and getting preventive care can help to protect your health and wellness after age 15. Screening and testing are the best way to find a health problem early and help you avoid having a fall. Early diagnosis and treatment give you the best chance for managing medical conditions that are more common for people who are older than age 42. Falls are a major cause of broken bones and head injuries in people who are older than age 64. Take precautions to prevent a fall at home. Work with your health care provider to learn what changes you can make to improve your health and wellness and to prevent falls. This information is not intended to replace advice given to you by your health care provider. Make sure you discuss any questions you have with your health care provider. Document Revised: 02/16/2021 Document Reviewed: 02/16/2021 Elsevier Patient Education  2024 [employer].

## 2023-10-19 NOTE — Assessment & Plan Note (Signed)
 Unresponsive to over-the-counter treatment Secondary to yeast infection Also has oral thrush Recommend to start nystatin suspension and oral fluconazole 150 mg for 7 days Recommend dermatology evaluation Referral placed today

## 2023-10-19 NOTE — Assessment & Plan Note (Signed)
 No recent flareup Continues daily allopurinol 100 mg

## 2023-10-19 NOTE — Assessment & Plan Note (Signed)
Stable.  On CPAP treatment. 

## 2023-10-19 NOTE — Progress Notes (Signed)
 [name] [name] 68 y.o.   Chief Complaint  Patient presents with   Follow-up    6 month for gout/ DM. Pt c/o of her lip being flared up since July and she has tried all her remedies that normally work but is not     HISTORY OF PRESENT ILLNESS: This is a 68 y.o. female here for 78-month follow-up of chronic medical conditions Also complaining of lower lip inflammation chronic for the past 6 months Was recently seen by cardiologist.  No concerns. No other complaints or medical concerns today.  Wt Readings from Last 3 Encounters:  10/19/23 192 lb (87.1 kg)  06/21/23 200 lb (90.7 kg)  04/18/23 200 lb 8 oz (90.9 kg)   BP Readings from Last 3 Encounters:  10/19/23 122/82  06/21/23 (!) 140/88  04/18/23 130/70     HPI   Prior to Admission medications   Medication Sig Start Date End Date Taking? Authorizing Provider  Accu-Chek Softclix Lancets lancets USE AS DIRECTED 05/10/21  Yes Emme Rosenau, Emil Schanz, MD  albuterol  (VENTOLIN  HFA) 108 [phone] Base) MCG/ACT inhaler Inhale 1-2 puffs into the lungs every 6 (six) hours as needed for wheezing or shortness of breath. 04/17/19  Yes Janyah Singleterry, Emil Schanz, MD  Alirocumab  (PRALUENT ) 75 MG/ML SOAJ INJECT 75 MG SUBCUTANEOUSLY EVERY 14 DAYS 09/16/23  Yes End, Lonni, MD  allopurinol  (ZYLOPRIM ) 100 MG tablet Take 1 tablet (100 mg total) by mouth daily. 11/10/22  Yes Elijah Michaelis, Emil Schanz, MD  atorvastatin  (LIPITOR ) 80 MG tablet TAKE 1 TABLET BY MOUTH ONCE  DAILY AT 6 PM 06/28/23  Yes Tawyna Pellot, Emil Schanz, MD  Blood Glucose Monitoring Suppl (ACCU-CHEK GUIDE) w/Device KIT 1 Device by Does not apply route in the morning, at noon, and at bedtime. 05/24/22  Yes Andersen Iorio, Emil Schanz, MD  cetirizine (ZYRTEC) 10 MG tablet Take 10 mg by mouth daily.   Yes [provider]  citalopram  (CELEXA ) 20 MG tablet TAKE 1 TABLET BY MOUTH DAILY 10/04/23  Yes Natalin Bible, Emil Schanz, MD  clopidogrel  (PLAVIX ) 75 MG tablet TAKE 1 TABLET BY MOUTH DAILY 08/05/23  Yes End,  Lonni, MD  dicyclomine  (BENTYL ) 20 MG tablet Take 20 mg by mouth 3 (three) times daily. 07/11/21  Yes [provider]  Dulaglutide  (TRULICITY ) 0.75 MG/0.5ML SOAJ INJECT 0.75MG  (0.5ML) UNDER THE SKIN ONCE A WEEK. 08/16/23  Yes Kang Ishida Jose, MD  fluticasone  (FLONASE ) 50 MCG/ACT nasal spray USE 2 SPRAYS IN BOTH NOSTRILS  DAILY 09/24/23  Yes Simcha Speir Jose, MD  fluticasone  (FLOVENT  HFA) 110 MCG/ACT inhaler Inhale 2 puffs into the lungs 2 (two) times daily as needed.   Yes [provider]  furosemide  (LASIX ) 20 MG tablet Take 2 tablets (40 mg total) by mouth daily. 06/21/23  Yes Fernande Elspeth BROCKS, MD  glucose blood test strip Use as instructed 06/01/22  Yes Josaphine Shimamoto Jose, MD  hydrocortisone cream 1 % Apply 1 application topically 2 (two) times daily.   Yes [provider]  lisinopril  (ZESTRIL ) 40 MG tablet TAKE 1 TABLET BY MOUTH DAILY 06/26/23  Yes Zelda Reames, Emil Schanz, MD  loratadine  (CLARITIN ) 10 MG tablet Take 10 mg by mouth daily.   Yes [provider]  metoprolol  succinate (TOPROL -XL) 50 MG 24 hr tablet TAKE 1 TABLET BY MOUTH TWICE  DAILY WITH OR IMMEDIATELY  FOLLOWING A MEAL 03/07/23  Yes Amed Datta, Emil Schanz, MD  Multiple Vitamin (MULTIVITAMIN) tablet Take 1 tablet by mouth daily. 08/30/16  Yes Dwan Aldo M, PA-C  nystatin  cream (MYCOSTATIN ) Apply  1 application topically as needed for dry skin. 04/28/20  Yes Adelaide Pfefferkorn, Emil Schanz, MD  omeprazole  (PRILOSEC) 20 MG capsule Take 1 capsule (20 mg total) by mouth daily. 12/15/16  Yes English, Corean D, PA  ondansetron  (ZOFRAN -ODT) 8 MG disintegrating tablet DISSOLVE 1 TABLET ON TOP OF THE  TONGUE EVERY 12 HOURS AS NEEDED  FOR NAUSEA 08/21/23  Yes Karole Oo, Emil Schanz, MD  polycarbophil (FIBERCON) 625 MG tablet Take by mouth daily.   Yes [provider]  traMADol  (ULTRAM ) 50 MG tablet TAKE 1 TABLET BY MOUTH AT  BEDTIME AS NEEDED 12/27/22  Yes Velton Roselle, Emil Schanz, MD   triamcinolone  cream (KENALOG ) 0.1 % APPLY 1 APPLICATION TOPICALLY  DAILY AS NEEDED 12/27/22  Yes Yajahira Tison, Emil Schanz, MD  colchicine  0.6 MG tablet Take 1 tablet (0.6 mg total) by mouth 2 (two) times daily for 5 days. Patient taking differently: Take 0.6 mg by mouth as needed (gout). 04/18/23 06/21/23  Purcell Emil Schanz, MD  gabapentin  (NEURONTIN ) 300 MG capsule TAKE 2 CAPSULES BY MOUTH IN THE  MORNING AND 3 CAPSULES BY MOUTH  AT BEDTIME Patient not taking: Reported on 10/19/2023 05/05/23   Purcell Emil Schanz, MD    Allergies  Allergen Reactions   Aspirin  Other (See Comments)    Sores in mouth Sores in mouth   Adhesive [Tape] Other (See Comments)    SKIN BLISTERS   Penicillins     Swelling    Red Yeast Rice Hives   Sulfa Drugs Cross Reactors Hives    Patient Active Problem List   Diagnosis Date Noted   History of gout 04/18/2023   Acute gout of right wrist 04/18/2023   Tiredness 11/10/2022   Hyperuricemia 06/10/2022   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) 06/09/2022   Chronic left shoulder pain 10/28/2021   Hyperlipidemia associated with type 2 diabetes mellitus (HCC) 12/10/2020   Trigger finger, right middle finger 11/07/2019   Hypertension associated with diabetes (HCC) 10/29/2019   Chronic heart failure with preserved ejection fraction (HFpEF) (HCC) 05/25/2019   Coronary artery disease involving native coronary artery of native heart with angina pectoris (HCC) 02/14/2018   Morbid obesity (HCC) 02/14/2018   Irritable bowel syndrome 06/28/2017   S/P CABG x 3 07/22/2016   Multiple vessel coronary artery disease 07/21/2016   Environmental allergies 04/26/2016   Sinus node dysfunction (HCC) 04/26/2016   Pacemaker Medtronic    OSA (obstructive sleep apnea) 05/25/2011   Hx of adenomatous colonic polyps 05/25/2011   Migraine 05/25/2011   Diabetes mellitus type 2, uncontrolled, with complications 05/25/2011   GERD (gastroesophageal reflux  disease) 05/25/2011   NAFLD (nonalcoholic fatty liver disease) 91/85/7987   Hyperlipidemia LDL goal <70 05/25/2011   Essential hypertension 09/16/2009   DIASTOLIC HEART FAILURE, CHRONIC 09/16/2009    Past Medical History:  Diagnosis Date   Allergy    Anal fissure    Anxiety    Arthritis    Asthma    Cataract 2010    left   Colon polyps    diverticulosis  03-2011/2005   Depression    Diabetes mellitus 2012   T2DM   Diverticulosis    Fatty liver 04/2011   GERD (gastroesophageal reflux disease)    Hyperlipidemia    Hypertension severe    Migraine    Obesity    OSA (obstructive sleep apnea)    C-Pap   Pacemaker Medtronic    Sinoatrial node dysfunction (HCC)    syncope    Past Surgical  History:  Procedure Laterality Date   ABDOMINAL HYSTERECTOMY  2000   total   CARDIAC CATHETERIZATION N/A 07/20/2016   Procedure: Left Heart Cath and Coronary Angiography;  Surgeon: Peter M Jordan, MD;  Location: [facility] INVASIVE CV LAB;  Service: Cardiovascular;  Laterality: N/A;   CATARACT EXTRACTION Left    CORONARY ARTERY BYPASS GRAFT N/A 07/22/2016   Procedure: CORONARY ARTERY BYPASS GRAFTING (CABG) x three , using left internal mammary artery and right leg greater saphenous vein harvested endoscopically;  Surgeon: Maude Fleeta Ochoa, MD;  Location: [facility] OR;  Service: Open Heart Surgery;  Laterality: N/A;   EYE SURGERY Right 2010   MULTIPLE TOOTH EXTRACTIONS     OOPHORECTOMY     PACEMAKER PLACEMENT     T/A right knee     TEE WITHOUT CARDIOVERSION N/A 07/22/2016   Procedure: TRANSESOPHAGEAL ECHOCARDIOGRAM (TEE);  Surgeon: Maude Fleeta Ochoa, MD;  Location: [facility] OR;  Service: Open Heart Surgery;  Laterality: N/A;   TONSILLECTOMY      Social History   Socioeconomic History   Marital status: Single    Spouse name: Not on file   Number of children: 0   Years of education: Not on file   Highest education level: Not on file  Occupational History   Occupation: chef    Employer: [employer]  Tobacco Use   Smoking status: Former    Current packs/day: 0.00    Types: Cigarettes    Quit date: 1995    Years since quitting: 30.0   Smokeless tobacco: Never   Tobacco comments:    Quit 20 yrs ago  Vaping Use   Vaping status: Never Used  Substance and Sexual Activity   Alcohol use: No    Alcohol/week: 0.0 standard drinks of alcohol   Drug use: No   Sexual activity: Yes    Birth control/protection: Post-menopausal, Other-see comments    Comment: Monogamous   Other Topics Concern   Not on file  Social History Narrative   Single. Exercise: No. Education: College.   Social Drivers of Corporate Investment Banker Strain: Low Risk  (01/13/2023)   Overall Financial Resource Strain (CARDIA)    Difficulty of Paying Living Expenses: Not hard at all  Food Insecurity: No Food Insecurity (01/13/2023)   Hunger Vital Sign    Worried About Running Out of Food in the Last Year: Never true    Ran Out of Food in the Last Year: Never true  Transportation Needs: No Transportation Needs (01/13/2023)   PRAPARE - Administrator, Civil Service (Medical): No    Lack of Transportation (Non-Medical): No  Physical Activity: Sufficiently Active (01/13/2023)   Exercise Vital Sign    Days of Exercise per Week: 5 days    Minutes of Exercise per Session: 30 min  Stress: No Stress Concern Present (01/13/2023)   [employer] of Occupational Health - Occupational Stress Questionnaire    Feeling of Stress : Not at all  Social Connections: Socially Isolated (01/13/2023)   Social Connection and Isolation Panel [NHANES]    Frequency of Communication with Friends and Family: Twice a week    Frequency of Social Gatherings with Friends and Family: Twice a week    Attends Religious Services: Never    Database Administrator or Organizations: No    Attends Banker Meetings: Never    Marital Status: Never married  Intimate Partner Violence: Not At Risk (01/13/2023)   Humiliation,  Afraid, Rape, and  Kick questionnaire    Fear of Current or Ex-Partner: No    Emotionally Abused: No    Physically Abused: No    Sexually Abused: No    Family History  Problem Relation Age of Onset   Heart disease Mother    Cancer Mother 30       lung   Breast cancer Mother 68   Heart disease Father    Heart attack Sister    Diabetes Sister        type 2   Atrial fibrillation Sister    Sleep apnea Sister    Heart attack Brother    Heart attack Brother    Cancer Maternal Grandmother        breast   Breast cancer Maternal Grandmother    Leukemia Paternal Grandmother        neoplast   Aneurysm Paternal Grandfather        abdominal (stomach)   Colon cancer Neg Hx      Review of Systems  Constitutional: Negative.  Negative for chills and fever.  HENT: Negative.  Negative for congestion and sore throat.   Respiratory: Negative.  Negative for cough and shortness of breath.   Cardiovascular: Negative.  Negative for chest pain and palpitations.  Gastrointestinal:  Negative for abdominal pain, diarrhea, nausea and vomiting.  Genitourinary: Negative.  Negative for dysuria and hematuria.  Skin: Negative.  Negative for rash.  Neurological: Negative.  Negative for dizziness and headaches.  All other systems reviewed and are negative.   Vitals:   10/19/23 0945  BP: 122/82  Pulse: 63  Temp: 98.1 F (36.7 C)  SpO2: 97%    Physical Exam Vitals reviewed.  Constitutional:      Appearance: Normal appearance.  HENT:     Head: Normocephalic.     Mouth/Throat:     Mouth: Mucous membranes are moist.     Pharynx: Oropharynx is clear.  Eyes:     Extraocular Movements: Extraocular movements intact.     Conjunctiva/sclera: Conjunctivae normal.     Pupils: Pupils are equal, round, and reactive to light.  Cardiovascular:     Rate and Rhythm: Normal rate and regular rhythm.     Pulses: Normal pulses.     Heart sounds: Normal heart sounds.  Pulmonary:     Effort: Pulmonary effort  is normal.     Breath sounds: Normal breath sounds.  Musculoskeletal:     Cervical back: No tenderness.  Lymphadenopathy:     Cervical: No cervical adenopathy.  Skin:    General: Skin is warm and dry.     Capillary Refill: Capillary refill takes less than 2 seconds.  Neurological:     General: No focal deficit present.     Mental Status: She is alert and oriented to person, place, and time.  Psychiatric:        Mood and Affect: Mood normal.        Behavior: Behavior normal.      ASSESSMENT & PLAN: A total of 43 minutes was spent with the patient and counseling/coordination of care regarding preparing for this visit, review of most recent office visit notes, review of multiple chronic medical conditions and their management, cardiovascular risks associated with diabetes and hypertension, review of all medications, review of most recent bloodwork results, review of health maintenance items, education on nutrition, prognosis, documentation, and need for follow up.   Problem List Items Addressed This Visit       Cardiovascular and Mediastinum  Sinus node dysfunction (HCC)   Permanent pacemaker working well.      Coronary artery disease involving native coronary artery of native heart with angina pectoris (HCC)   Stable.  No recent use of nitroglycerin  Continues metoprolol  succinate 50 mg daily. Continues Plavix  75 mg daily      Chronic heart failure with preserved ejection fraction (HFpEF) (HCC)   No clinical findings of acute congestive heart failure Continues furosemide  20 mg daily along with metoprolol  succinate 50 mg daily and lisinopril  40 mg daily      Hypertension associated with diabetes (HCC) - Primary   Well-controlled hypertension Continue metoprolol  succinate 50 mg daily and lisinopril  40 mg daily Well-controlled diabetes Continue weekly Trulicity  0.75 mg Cardiovascular risks associated with hypertension and diabetes discussed Diet and nutrition discussed       Relevant Orders   Uric acid   CBC with Differential/Platelet   Comprehensive metabolic panel   Hemoglobin A1c   Lipid panel   Urine Microalbumin w/creat. ratio     Respiratory   OSA (obstructive sleep apnea) (Chronic)   Stable.  On CPAP treatment.        Digestive   GERD (gastroesophageal reflux disease)   Stable.  Asymptomatic.      Infection of lip   Unresponsive to over-the-counter treatment Secondary to yeast infection Also has oral thrush Recommend to start nystatin  suspension and oral fluconazole  150 mg for 7 days Recommend dermatology evaluation Referral placed today      Relevant Medications   nystatin  (MYCOSTATIN ) 100000 UNIT/ML suspension   fluconazole  (DIFLUCAN ) 150 MG tablet   Other Relevant Orders   Ambulatory referral to Dermatology     Endocrine   Hyperlipidemia associated with type 2 diabetes mellitus (HCC)   Chronic stable condition Continue atorvastatin  80 mg daily      Relevant Orders   Uric acid   CBC with Differential/Platelet   Comprehensive metabolic panel   Hemoglobin A1c   Lipid panel   Urine Microalbumin w/creat. ratio     Other   Morbid obesity (HCC)   Continues to lose weight Eating better Diet and nutrition discussed      History of gout   No recent flareup Continues daily allopurinol  100 mg      Other Visit Diagnoses       Oral thrush       Relevant Medications   nystatin  (MYCOSTATIN ) 100000 UNIT/ML suspension   fluconazole  (DIFLUCAN ) 150 MG tablet      Patient Instructions  Health Maintenance After Age 31 After age 53, you are at a higher risk for certain long-term diseases and infections as well as injuries from falls. Falls are a major cause of broken bones and head injuries in people who are older than age 66. Getting regular preventive care can help to keep you healthy and well. Preventive care includes getting regular testing and making lifestyle changes as recommended by your health care provider. Talk  with your health care provider about: Which screenings and tests you should have. A screening is a test that checks for a disease when you have no symptoms. A diet and exercise plan that is right for you. What should I know about screenings and tests to prevent falls? Screening and testing are the best ways to find a health problem early. Early diagnosis and treatment give you the best chance of managing medical conditions that are common after age 15. Certain conditions and lifestyle choices may make you more likely to have a fall.  Your health care provider may recommend: Regular vision checks. Poor vision and conditions such as cataracts can make you more likely to have a fall. If you wear glasses, make sure to get your prescription updated if your vision changes. Medicine review. Work with your health care provider to regularly review all of the medicines you are taking, including over-the-counter medicines. Ask your health care provider about any side effects that may make you more likely to have a fall. Tell your health care provider if any medicines that you take make you feel dizzy or sleepy. Strength and balance checks. Your health care provider may recommend certain tests to check your strength and balance while standing, walking, or changing positions. Foot health exam. Foot pain and numbness, as well as not wearing proper footwear, can make you more likely to have a fall. Screenings, including: Osteoporosis screening. Osteoporosis is a condition that causes the bones to get weaker and break more easily. Blood pressure screening. Blood pressure changes and medicines to control blood pressure can make you feel dizzy. Depression screening. You may be more likely to have a fall if you have a fear of falling, feel depressed, or feel unable to do activities that you used to do. Alcohol use screening. Using too much alcohol can affect your balance and may make you more likely to have a fall. Follow  these instructions at home: Lifestyle Do not drink alcohol if: Your health care provider tells you not to drink. If you drink alcohol: Limit how much you have to: 0-1 drink a day for women. 0-2 drinks a day for men. Know how much alcohol is in your drink. In the [place] one drink equals one 12 oz bottle of beer (355 mL), one 5 oz glass of wine (148 mL), or one 1 oz glass of hard liquor (44 mL). Do not use any products that contain nicotine or tobacco. These products include cigarettes, chewing tobacco, and vaping devices, such as e-cigarettes. If you need help quitting, ask your health care provider. Activity  Follow a regular exercise program to stay fit. This will help you maintain your balance. Ask your health care provider what types of exercise are appropriate for you. If you need a cane or walker, use it as recommended by your health care provider. Wear supportive shoes that have nonskid soles. Safety  Remove any tripping hazards, such as rugs, cords, and clutter. Install safety equipment such as grab bars in bathrooms and safety rails on stairs. Keep rooms and walkways well-lit. General instructions Talk with your health care provider about your risks for falling. Tell your health care provider if: You fall. Be sure to tell your health care provider about all falls, even ones that seem minor. You feel dizzy, tiredness (fatigue), or off-balance. Take over-the-counter and prescription medicines only as told by your health care provider. These include supplements. Eat a healthy diet and maintain a healthy weight. A healthy diet includes low-fat dairy products, low-fat (lean) meats, and fiber from whole grains, beans, and lots of fruits and vegetables. Stay current with your vaccines. Schedule regular health, dental, and eye exams. Summary Having a healthy lifestyle and getting preventive care can help to protect your health and wellness after age 69. Screening and testing are the  best way to find a health problem early and help you avoid having a fall. Early diagnosis and treatment give you the best chance for managing medical conditions that are more common for people who are older than age 19.  Falls are a major cause of broken bones and head injuries in people who are older than age 73. Take precautions to prevent a fall at home. Work with your health care provider to learn what changes you can make to improve your health and wellness and to prevent falls. This information is not intended to replace advice given to you by your health care provider. Make sure you discuss any questions you have with your health care provider. Document Revised: 02/16/2021 Document Reviewed: 02/16/2021 Elsevier Patient Education  2024 Elsevier Inc.     Emil Schaumann, MD [facility]

## 2023-11-02 ENCOUNTER — Other Ambulatory Visit: Payer: Self-pay | Admitting: Emergency Medicine

## 2023-11-02 DIAGNOSIS — E1142 Type 2 diabetes mellitus with diabetic polyneuropathy: Secondary | ICD-10-CM

## 2023-11-14 ENCOUNTER — Other Ambulatory Visit: Payer: Self-pay | Admitting: Emergency Medicine

## 2023-11-23 ENCOUNTER — Encounter: Payer: Self-pay | Admitting: Emergency Medicine

## 2023-11-25 ENCOUNTER — Other Ambulatory Visit: Payer: Self-pay | Admitting: Emergency Medicine

## 2023-11-25 DIAGNOSIS — E790 Hyperuricemia without signs of inflammatory arthritis and tophaceous disease: Secondary | ICD-10-CM

## 2023-12-05 ENCOUNTER — Encounter: Payer: Self-pay | Admitting: Adult Health

## 2023-12-06 NOTE — Telephone Encounter (Signed)
 Pt's next visit is in April.

## 2023-12-06 NOTE — Telephone Encounter (Signed)
 New, Maryella Shivers, Otilio Jefferson, RN; [place], Elige Radon; Juventino Slovak; 1 other  [place],  There are no note or new orders on this account. Last notes are from 08/06/23 and last orders are from 01/25/23. If [employer] is requesting something it will be a new order for supplies along with an OV face 2 face note showing usage and benefit since it been almost a year since last service. This will be needed for supplies.  Without new orders or pending orders the patient would need to follow up with their insurance directly.  Thank you,  Luellen Pucker

## 2023-12-06 NOTE — Telephone Encounter (Signed)
 Message sent to [employer] to follow-up on [employer] request for info. We haven't received anything yet from [place].

## 2023-12-09 ENCOUNTER — Ambulatory Visit (INDEPENDENT_AMBULATORY_CARE_PROVIDER_SITE_OTHER): Payer: Medicare Other

## 2023-12-09 DIAGNOSIS — I495 Sick sinus syndrome: Secondary | ICD-10-CM | POA: Diagnosis not present

## 2023-12-14 LAB — CUP PACEART REMOTE DEVICE CHECK
Battery Impedance: 2894 Ohm
Battery Remaining Longevity: 30 mo
Battery Voltage: 2.72 V
Brady Statistic AP VP Percent: 0 %
Brady Statistic AP VS Percent: 0 %
Brady Statistic AS VP Percent: 1 %
Brady Statistic AS VS Percent: 99 %
Date Time Interrogation Session: 20250304173918
Implantable Lead Connection Status: 753985
Implantable Lead Connection Status: 753985
Implantable Lead Implant Date: 20091201
Implantable Lead Implant Date: 20091201
Implantable Lead Location: 753859
Implantable Lead Location: 753860
Implantable Lead Model: 5076
Implantable Lead Model: 5076
Implantable Pulse Generator Implant Date: 20091201
Lead Channel Impedance Value: 411 Ohm
Lead Channel Impedance Value: 522 Ohm
Lead Channel Pacing Threshold Amplitude: 0.5 V
Lead Channel Pacing Threshold Amplitude: 1 V
Lead Channel Pacing Threshold Pulse Width: 0.4 ms
Lead Channel Pacing Threshold Pulse Width: 0.4 ms
Lead Channel Setting Pacing Amplitude: 2 V
Lead Channel Setting Pacing Amplitude: 2.5 V
Lead Channel Setting Pacing Pulse Width: 0.4 ms
Lead Channel Setting Sensing Sensitivity: 2.8 mV
Zone Setting Status: 755011
Zone Setting Status: 755011

## 2023-12-15 ENCOUNTER — Other Ambulatory Visit: Payer: Self-pay | Admitting: Emergency Medicine

## 2023-12-15 DIAGNOSIS — G629 Polyneuropathy, unspecified: Secondary | ICD-10-CM

## 2023-12-27 ENCOUNTER — Encounter: Payer: Self-pay | Admitting: Internal Medicine

## 2024-01-06 NOTE — Addendum Note (Signed)
 Addended by: Elease Etienne A on: 01/06/2024 09:30 AM   Modules accepted: Orders

## 2024-01-06 NOTE — Progress Notes (Signed)
 Remote pacemaker transmission.

## 2024-01-11 ENCOUNTER — Ambulatory Visit (INDEPENDENT_AMBULATORY_CARE_PROVIDER_SITE_OTHER): Payer: Medicare Other

## 2024-01-11 ENCOUNTER — Other Ambulatory Visit: Payer: Self-pay | Admitting: Internal Medicine

## 2024-01-11 VITALS — BP 140/100 | Ht 62.0 in | Wt 185.0 lb

## 2024-01-11 DIAGNOSIS — Z0000 Encounter for general adult medical examination without abnormal findings: Secondary | ICD-10-CM | POA: Diagnosis not present

## 2024-01-11 NOTE — Progress Notes (Signed)
 Subjective:   [name] [name] is a 68 y.o. who presents for a Medicare Wellness preventive visit.  Visit Complete: Virtual I connected with  Apolonio Schneiders on 01/11/24 by a video and audio enabled telemedicine application and verified that I am speaking with the correct person using two identifiers.  Patient Location: Home  Provider Location: Home Office  I discussed the limitations of evaluation and management by telemedicine. The patient expressed understanding and agreed to proceed.  Vital Signs: Because this visit was a virtual/telehealth visit, some criteria may be missing or patient reported. Any vitals not documented were not able to be obtained and vitals that have been documented are patient reported.   Persons Participating in Visit: Patient.  AWV Questionnaire: No: Patient Medicare AWV questionnaire was not completed prior to this visit.  Cardiac Risk Factors include: hypertension;advanced age (>71men, >14 women);Other (see comment);diabetes mellitus;dyslipidemia, Risk factor comments: OSA, NAFLD, DIASTOLIC HEART FAILURE, CHRONIC     Objective:    Today's Vitals   01/11/24 0958  BP: (!) 140/100  Weight: 185 lb (83.9 kg)  Height: 5\' 2"  (1.575 m)   Body mass index is 33.84 kg/m.     01/11/2024   10:14 AM 01/13/2023    3:36 PM 01/11/2022   10:36 AM 09/01/2020   10:24 AM 07/20/2016    8:00 PM  Advanced Directives  Does Patient Have a Medical Advance Directive? Yes Yes Yes Yes Yes  Type of Estate agent of [place];Living will Healthcare Power of [place];Living will Healthcare Power of [place];Living will Healthcare Power of Attorney Living will  Does patient want to make changes to medical advance directive?    No - Patient declined No - Patient declined  Copy of Healthcare Power of Attorney in Chart? No - copy requested No - copy requested No - copy requested Yes - validated most recent copy scanned in chart (See row information) No - copy  requested    Current Medications (verified) Outpatient Encounter Medications as of 01/11/2024  Medication Sig   Accu-Chek Softclix Lancets lancets USE AS DIRECTED   albuterol (VENTOLIN HFA) 108 (90 Base) MCG/ACT inhaler Inhale 1-2 puffs into the lungs every 6 (six) hours as needed for wheezing or shortness of breath.   Alirocumab (PRALUENT) 75 MG/ML SOAJ INJECT 75 MG SUBCUTANEOUSLY EVERY 14 DAYS   allopurinol (ZYLOPRIM) 100 MG tablet Take 1 tablet by mouth once daily   atorvastatin (LIPITOR) 80 MG tablet TAKE 1 TABLET BY MOUTH ONCE  DAILY AT 6 PM   Blood Glucose Monitoring Suppl (ACCU-CHEK GUIDE) w/Device KIT 1 Device by Does not apply route in the morning, at noon, and at bedtime.   cetirizine (ZYRTEC) 10 MG tablet Take 10 mg by mouth daily.   citalopram (CELEXA) 20 MG tablet TAKE 1 TABLET BY MOUTH DAILY   clopidogrel (PLAVIX) 75 MG tablet TAKE 1 TABLET BY MOUTH DAILY   dicyclomine (BENTYL) 20 MG tablet Take 20 mg by mouth 3 (three) times daily.   fluticasone (FLONASE) 50 MCG/ACT nasal spray USE 2 SPRAYS IN BOTH NOSTRILS  DAILY   fluticasone (FLOVENT HFA) 110 MCG/ACT inhaler Inhale 2 puffs into the lungs 2 (two) times daily as needed.   furosemide (LASIX) 20 MG tablet Take 2 tablets (40 mg total) by mouth daily.   gabapentin (NEURONTIN) 300 MG capsule TAKE 2 CAPSULES BY MOUTH IN THE  MORNING AND 3 CAPSULES BY MOUTH  AT BEDTIME (Patient taking differently: TAKE 2 CAPSULES BY MOUTH IN THE  MORNING AND 3 CAPSULES  BY MOUTH  AT BEDTIME)   glucose blood test strip Use as instructed   hydrocortisone cream 1 % Apply 1 application topically 2 (two) times daily.   lisinopril (ZESTRIL) 40 MG tablet TAKE 1 TABLET BY MOUTH DAILY   loratadine (CLARITIN) 10 MG tablet Take 10 mg by mouth daily.   metoprolol succinate (TOPROL-XL) 50 MG 24 hr tablet TAKE 1 TABLET BY MOUTH TWICE  DAILY WITH OR IMMEDIATELY  FOLLOWING A MEAL   Multiple Vitamin (MULTIVITAMIN) tablet Take 1 tablet by mouth daily.   nystatin cream  (MYCOSTATIN) Apply 1 application topically as needed for dry skin.   omeprazole (PRILOSEC) 20 MG capsule Take 1 capsule (20 mg total) by mouth daily.   ondansetron (ZOFRAN-ODT) 8 MG disintegrating tablet DISSOLVE 1 TABLET ON TOP OF THE  TONGUE EVERY 12 HOURS AS NEEDED  FOR NAUSEA   polycarbophil (FIBERCON) 625 MG tablet Take by mouth daily.   traMADol (ULTRAM) 50 MG tablet TAKE 1 TABLET BY MOUTH AT  BEDTIME AS NEEDED   triamcinolone cream (KENALOG) 0.1 % APPLY 1 APPLICATION TOPICALLY  DAILY AS NEEDED   TRULICITY 0.75 MG/0.5ML SOAJ INJECT 0.75MG  (0.5ML) UNDER THE SKIN ONCE A WEEK.   colchicine 0.6 MG tablet Take 1 tablet (0.6 mg total) by mouth 2 (two) times daily for 5 days. (Patient taking differently: Take 0.6 mg by mouth as needed (gout).)   No facility-administered encounter medications on file as of 01/11/2024.    Allergies (verified) Aspirin, Adhesive [tape], Penicillins, Red yeast rice, and Sulfa drugs cross reactors   History: Past Medical History:  Diagnosis Date   Allergy    Anal fissure    Anxiety    Arthritis    Asthma    Cataract 2010    left   Colon polyps    diverticulosis  03-2011/2005   Depression    Diabetes mellitus 2012   T2DM   Diverticulosis    Fatty liver 04/2011   GERD (gastroesophageal reflux disease)    Hyperlipidemia    Hypertension severe    Migraine    Obesity    OSA (obstructive sleep apnea)    C-Pap   Pacemaker Medtronic    Sinoatrial node dysfunction (HCC)    syncope   Past Surgical History:  Procedure Laterality Date   ABDOMINAL HYSTERECTOMY  2000   total   CARDIAC CATHETERIZATION N/A 07/20/2016   Procedure: Left Heart Cath and Coronary Angiography;  Surgeon: Peter M Swaz[place]and, MD;  Location: [facility] INVASIVE CV LAB;  Service: Cardiovascular;  Laterality: N/A;   CATARACT EXTRACTION Left    CORONARY ARTERY BYPASS GRAFT N/A 07/22/2016   Procedure: CORONARY ARTERY BYPASS GRAFTING (CABG) x three , using left internal mammary artery and right leg  greater saphenous vein harvested endoscopically;  Surgeon: Kerin Perna, MD;  Location: [facility] OR;  Service: Open Heart Surgery;  Laterality: N/A;   EYE SURGERY Right 2010   MULTIPLE TOOTH EXTRACTIONS     OOPHORECTOMY     PACEMAKER PLACEMENT     T/A right knee     TEE WITHOUT CARDIOVERSION N/A 07/22/2016   Procedure: TRANSESOPHAGEAL ECHOCARDIOGRAM (TEE);  Surgeon: Kerin Perna, MD;  Location: [facility] OR;  Service: Open Heart Surgery;  Laterality: N/A;   TONSILLECTOMY     Family History  Problem Relation Age of Onset   Heart disease Mother    Cancer Mother 13       lung   Breast cancer Mother 81   Heart disease Father  Heart attack Sister    Diabetes Sister        type 2   Atrial fibrillation Sister    Sleep apnea Sister    Heart attack Brother    Heart attack Brother    Cancer Maternal Grandmother        breast   Breast cancer Maternal Grandmother    Leukemia Paternal Grandmother        neoplast   Aneurysm Paternal Grandfather        abdominal (stomach)   Colon cancer Neg Hx    Social History   Socioeconomic History   Marital status: Single    Spouse name: Not on file   Number of children: 0   Years of education: Not on file   Highest education level: Not on file  Occupational History   Occupation: RETIRED/chef    Employer: [employer]  Tobacco Use   Smoking status: Former    Current packs/day: 0.00    Types: Cigarettes    Quit date: 1995    Years since quitting: 30.2   Smokeless tobacco: Never   Tobacco comments:    Quit 20 yrs ago  Vaping Use   Vaping status: Never Used  Substance and Sexual Activity   Alcohol use: No    Alcohol/week: 0.0 standard drinks of alcohol   Drug use: No   Sexual activity: Yes    Birth control/protection: Post-menopausal, Other-see comments    Comment: Monogamous   Other Topics Concern   Not on file  Social History Narrative   Single. Exercise: No. Education: College.      Lives alone   Social Drivers of  Health   Financial Resource Strain: Low Risk  (01/11/2024)   Overall Financial Resource Strain (CARDIA)    Difficulty of Paying Living Expenses: Not very hard  Food Insecurity: No Food Insecurity (01/11/2024)   Hunger Vital Sign    Worried About Running Out of Food in the Last Year: Never true    Ran Out of Food in the Last Year: Never true  Transportation Needs: No Transportation Needs (01/11/2024)   PRAPARE - Administrator, Civil Service (Medical): No    Lack of Transportation (Non-Medical): No  Physical Activity: Insufficiently Active (01/11/2024)   Exercise Vital Sign    Days of Exercise per Week: 2 days    Minutes of Exercise per Session: 30 min  Stress: No Stress Concern Present (01/11/2024)   [employer] of Occupational Health - Occupational Stress Questionnaire    Feeling of Stress : Not at all  Social Connections: Socially Isolated (01/11/2024)   Social Connection and Isolation Panel [NHANES]    Frequency of Communication with Friends and Family: More than three times a week    Frequency of Social Gatherings with Friends and Family: More than three times a week    Attends Religious Services: Never    Database administrator or Organizations: No    Attends Engineer, structural: Never    Marital Status: Never married    Tobacco Counseling Counseling given: Not Answered Tobacco comments: Quit 20 yrs ago    Clinical Intake:  Pre-visit preparation completed: Yes  Pain : No/denies pain     BMI - recorded: 33.84 Nutritional Status: BMI > 30  Obese Nutritional Risks: None Diabetes: Yes CBG done?: Yes (150-per pt) CBG resulted in Enter/ Edit results?: No Did pt. bring in CBG monitor from home?: No  Lab Results  Component Value Date  HGBA1C 6.1 10/19/2023   HGBA1C 6.0 04/18/2023   HGBA1C 6.3 11/10/2022     How often do you need to have someone help you when you read instructions, pamphlets, or other written materials from your doctor or  pharmacy?: 1 - Never  Interpreter Needed?: No  Information entered by :: Tristin Gladman, RMA   Activities of Daily Living     01/11/2024    9:58 AM 01/13/2023    3:39 PM  In your present state of health, do you have any difficulty performing the following activities:  Hearing? 0 0  Vision? 0 0  Difficulty concentrating or making decisions? 0 0  Walking or climbing stairs? 0 0  Dressing or bathing? 0 0  Doing errands, shopping? 0 0  Preparing Food and eating ? N N  Using the Toilet? N N  In the past six months, have you accidently leaked urine? N N  Do you have problems with loss of bowel control? N N  Managing your Medications? N N  Managing your Finances? N N  Housekeeping or managing your Housekeeping? N N    Patient Care Team: Georgina Quint, MD as PCP - General (Internal Medicine) End, Cristal Deer, MD as PCP - Cardiology (Cardiology) Himmelrich, Loree Fee, RD (Inactive) as Dietitian Duke Salvia, MD as Attending Physician (Cardiology) Janet Berlin, MD as Consulting Physician (Ophthalmology) Dannielle Huh, MD as Consulting Physician (Orthopedic Surgery)  Indicate any recent Medical Services you may have received from other than [facility] providers in the past year (date may be approximate).     Assessment:   This is a routine wellness examination for [name] [name].  Hearing/Vision screen Hearing Screening - Comments:: Denies hearing difficulties   Vision Screening - Comments:: Denies vision issues.    Goals Addressed   None    Depression Screen     01/11/2024   10:17 AM 10/19/2023    9:50 AM 04/18/2023    1:36 PM 01/13/2023    3:38 PM 11/10/2022   12:14 PM 06/09/2022    1:10 PM 04/29/2022    9:20 AM  PHQ 2/9 Scores  PHQ - 2 Score 0 0 0 0 0 0 5  PHQ- 9 Score 3   0   13    Fall Risk     01/11/2024   10:14 AM 10/19/2023    9:50 AM 04/18/2023    1:36 PM 01/13/2023    3:38 PM 11/10/2022   12:14 PM  Fall Risk   Falls in the past year? 0 0 0 0 0  Number falls in past  yr: 0 0 0 0 0  Injury with Fall? 0 0 0 0 0  Risk for fall due to : No Fall Risks No Fall Risks No Fall Risks No Fall Risks   Follow up Falls prevention discussed;Falls evaluation completed Falls evaluation completed Falls evaluation completed Falls prevention discussed     MEDICARE RISK AT HOME:  Medicare Risk at Home Any stairs in or around the home?: Yes If so, are there any without handrails?: Yes Home free of loose throw rugs in walkways, pet beds, electrical cords, etc?: Yes Adequate lighting in your home to reduce risk of falls?: Yes Life alert?: No Use of a cane, walker or w/c?: No Grab bars in the bathroom?: Yes Shower chair or bench in shower?: Yes Elevated toilet seat or a handicapped toilet?: Yes  TIMED UP AND GO:  Was the test performed?  No  Cognitive Function: Declined: Patient declined cognitive screening,  but was able to answer questions in an accurate and timely manner. No cognitive impairments observed.        01/13/2023    3:44 PM 09/01/2020   10:24 AM  6CIT Screen  What Year? 0 points 0 points  What month? 0 points 0 points  What time? 0 points 0 points  Count back from 20 0 points 0 points  Months in reverse 0 points 0 points  Repeat phrase 0 points 0 points  Total Score 0 points 0 points    Immunizations Immunization History  Administered Date(s) Administered   Fluad Quad(high Dose 65+) 10/09/2020, 09/22/2022   Hepatitis B 01/03/2012   Influenza, High Dose Seasonal PF 06/21/2019   Influenza, Seasonal, Injecte, Preservative Fre 09/26/2012   Influenza,inj,Quad PF,6+ Mos 11/22/2013, 09/10/2014, 09/15/2016, 10/19/2018   Influenza-Unspecified 06/21/2019, 07/31/2020, 06/29/2023   Moderna SARS-COV2 Booster Vaccination 05/05/2021, 10/28/2022   Moderna Sars-Covid-2 Vaccination 11/25/2019, 12/24/2019, 08/12/2020   Pneumococcal Polysaccharide-23 04/10/2008, 10/29/2019   Tdap 08/31/2010, 05/05/2021   Unspecified SARS-COV-2 Vaccination 09/22/2022   Zoster  Recombinant(Shingrix) 06/21/2019, 10/29/2019    Screening Tests Health Maintenance  Topic Date Due   Pneumonia Vaccine 59+ Years old (2 of 2 - PCV) 10/28/2020   Colonoscopy  05/07/2022   FOOT EXAM  10/28/2022   COVID-19 Vaccine (6 - 2024-25 season) 06/12/2023   Medicare Annual Wellness (AWV)  01/13/2024   HEMOGLOBIN A1C  04/17/2024   INFLUENZA VACCINE  05/11/2024   OPHTHALMOLOGY EXAM  06/26/2024   MAMMOGRAM  09/27/2024   Diabetic kidney evaluation - eGFR measurement  10/18/2024   Diabetic kidney evaluation - Urine ACR  10/18/2024   DTaP/Tdap/Td (3 - Td or Tdap) 05/06/2031   DEXA SCAN  Completed   Hepatitis C Screening  Completed   Zoster Vaccines- Shingrix  Completed   HPV VACCINES  Aged Out    Health Maintenance  Health Maintenance Due  Topic Date Due   Pneumonia Vaccine 61+ Years old (2 of 2 - PCV) 10/28/2020   Colonoscopy  05/07/2022   FOOT EXAM  10/28/2022   COVID-19 Vaccine (6 - 2024-25 season) 06/12/2023   Medicare Annual Wellness (AWV)  01/13/2024   Health Maintenance Items Addressed: See Nurse Notes  Additional Screening:  Vision Screening: Recommended annual ophthalmology exams for early detection of glaucoma and other disorders of the eye.  Dental Screening: Recommended annual dental exams for proper oral hygiene  Community Resource Referral / Chronic Care Management: CRR required this visit?  No   CCM required this visit?  No     Plan:     I have personally reviewed and noted the following in the patient's chart:   Medical and social history Use of alcohol, tobacco or illicit drugs  Current medications and supplements including opioid prescriptions. Patient is currently taking opioid prescriptions. Information provided to patient regarding non-opioid alternatives. Patient advised to discuss non-opioid treatment plan with their provider. Functional ability and status Nutritional status Physical activity Advanced directives List of other  physicians Hospitalizations, surgeries, and ER visits in previous 12 months Vitals Screenings to include cognitive, depression, and falls Referrals and appointments  In addition, I have reviewed and discussed with patient certain preventive protocols, quality metrics, and best practice recommendations. A written personalized care plan for preventive services as well as general preventive health recommendations were provided to patient.     Amair Shrout L Shadasia Oldfield, CMA   01/11/2024   After Visit Summary: (MyChart) Due to this being a telephonic visit, the after visit summary with patients personalized  plan was offered to patient via MyChart   Notes: Please refer to Routing Comments.

## 2024-01-11 NOTE — Patient Instructions (Signed)
 [name] [name] , Thank you for taking time to come for your Medicare Wellness Visit. I appreciate your ongoing commitment to your health goals. Please review the following plan we discussed and let me know if I can assist you in the future.   Referrals/Orders/Follow-Ups/Clinician Recommendations: It was nice speaking with you today.  You are due for a pneumonia vaccine, (PCV20).   You are also due for a foot exam from your PCP.  Remember to call and get scheduled for a colonoscopy this summer.  Each day, aim for 6 glasses of water, plenty of protein in your diet and try to get up and walk/ stretch every hour for 5-10 minutes at a time.    This is a list of the screening recommended for you and due dates:  Health Maintenance  Topic Date Due   Pneumonia Vaccine (2 of 2 - PCV) 10/28/2020   Colon Cancer Screening  05/07/2022   Complete foot exam   10/28/2022   COVID-19 Vaccine (6 - 2024-25 season) 06/12/2023   Medicare Annual Wellness Visit  01/13/2024   Hemoglobin A1C  04/17/2024   Flu Shot  05/11/2024   Eye exam for diabetics  06/26/2024   Mammogram  09/27/2024   Yearly kidney function blood test for diabetes  10/18/2024   Yearly kidney health urinalysis for diabetes  10/18/2024   DTaP/Tdap/Td vaccine (3 - Td or Tdap) 05/06/2031   DEXA scan (bone density measurement)  Completed   Hepatitis C Screening  Completed   Zoster (Shingles) Vaccine  Completed   HPV Vaccine  Aged Out    Advanced directives: (Copy Requested) Please bring a copy of your health care power of attorney and living will to the office to be added to your chart at your convenience. You can mail to [facility] [address] [place], [place] [address] or email to [email or web address]  Next Medicare Annual Wellness Visit scheduled for next year: Yes

## 2024-01-15 ENCOUNTER — Other Ambulatory Visit: Payer: Self-pay | Admitting: Emergency Medicine

## 2024-01-15 DIAGNOSIS — B370 Candidal stomatitis: Secondary | ICD-10-CM

## 2024-01-15 DIAGNOSIS — K130 Diseases of lips: Secondary | ICD-10-CM

## 2024-01-16 ENCOUNTER — Telehealth: Payer: Self-pay

## 2024-01-16 NOTE — Progress Notes (Unsigned)
 Marland Kitchen

## 2024-01-16 NOTE — Telephone Encounter (Signed)
 Please see MyChart message.

## 2024-01-17 ENCOUNTER — Telehealth: Payer: Medicare Other | Admitting: Adult Health

## 2024-01-17 DIAGNOSIS — G4733 Obstructive sleep apnea (adult) (pediatric): Secondary | ICD-10-CM

## 2024-01-17 NOTE — Progress Notes (Signed)
 New, Maryella Shivers, Otilio Jefferson, RN; Alain Honey; Jeris Penta, [place]; 1 other Received, thank you!

## 2024-01-17 NOTE — Progress Notes (Signed)
 Order sent to [facility].

## 2024-01-17 NOTE — Progress Notes (Addendum)
 PATIENT: [name] [name] DOB: 04-Apr-1956  REASON FOR VISIT: follow up HISTORY FROM: patient PRIMARY NEUROLOGIST: Dr. Omar Bibber   Virtual Visit via Video Note  I connected with [name] [name] on 01/31/24 at  2:45 PM EDT by a video enabled telemedicine application located remotely at [facility] and verified that I am speaking with the correct person using two identifiers who was located at their own home.   I discussed the limitations of evaluation and management by telemedicine and the availability of in person appointments. The patient expressed understanding and agreed to proceed.    HISTORY OF PRESENT ILLNESS: Today 01/17/24:  [name] [name] is a 68 y.o. female with a history of OSA on CPAP. Returns today for follow-up.  She reports that the CPAP is working well for her.  Her download is below.  Continues to find it beneficial.  Reports that she needs new supplies     01/04/23: [name] [name] is a 68 y.o. female with a history of OSA on CPAP. Returns today for follow-up.  Reports that CPAP is working well.  Continues to find it beneficial.  She uses her old machine when she is traveling.  Her download is below     12/30/21: [name] [name] is a 68 year old female with a history of obstructive sleep apnea on CPAP.  She returns today for follow-up.  She reports that the CPAP is working well.  She denies any new issues.  Returns today for follow-up.   REVIEW OF SYSTEMS: Out of a complete 14 system review of symptoms, the patient complains only of the following symptoms, and all other reviewed systems are negative.  ESS 3   ALLERGIES: Allergies  Allergen Reactions   Aspirin  Other (See Comments)    Sores in mouth Sores in mouth   Adhesive [Tape] Other (See Comments)    SKIN BLISTERS   Penicillins     Swelling    Red Yeast Rice Hives   Sulfa Drugs Cross Reactors Hives    HOME MEDICATIONS: Outpatient Medications Prior to Visit  Medication Sig Dispense Refill    Accu-Chek Softclix Lancets lancets USE AS DIRECTED 200 each 3   albuterol  (VENTOLIN  HFA) 108 (90 Base) MCG/ACT inhaler Inhale 1-2 puffs into the lungs every 6 (six) hours as needed for wheezing or shortness of breath. 6.7 g 3   Alirocumab  (PRALUENT ) 75 MG/ML SOAJ INJECT 75 MG SUBCUTANEOUSLY EVERY 14 DAYS 2 mL 3   allopurinol  (ZYLOPRIM ) 100 MG tablet Take 1 tablet by mouth once daily 90 tablet 0   atorvastatin  (LIPITOR ) 80 MG tablet TAKE 1 TABLET BY MOUTH ONCE  DAILY AT 6 PM 100 tablet 2   Blood Glucose Monitoring Suppl (ACCU-CHEK GUIDE) w/Device KIT 1 Device by Does not apply route in the morning, at noon, and at bedtime. 1 kit 0   cetirizine (ZYRTEC) 10 MG tablet Take 10 mg by mouth daily.     citalopram  (CELEXA ) 20 MG tablet TAKE 1 TABLET BY MOUTH DAILY 90 tablet 3   clopidogrel  (PLAVIX ) 75 MG tablet TAKE 1 TABLET BY MOUTH DAILY 100 tablet 2   colchicine  0.6 MG tablet Take 1 tablet (0.6 mg total) by mouth 2 (two) times daily for 5 days. (Patient taking differently: Take 0.6 mg by mouth as needed (gout).) 30 tablet 1   dicyclomine  (BENTYL ) 20 MG tablet Take 20 mg by mouth 3 (three) times daily.     fluconazole  (DIFLUCAN ) 150 MG tablet Take 1 tablet by mouth once daily for 7 days  7 tablet 0   fluticasone  (FLONASE ) 50 MCG/ACT nasal spray USE 2 SPRAYS IN BOTH NOSTRILS  DAILY 48 g 0   fluticasone  (FLOVENT  HFA) 110 MCG/ACT inhaler Inhale 2 puffs into the lungs 2 (two) times daily as needed.     furosemide  (LASIX ) 20 MG tablet Take 2 tablets (40 mg total) by mouth daily. 180 tablet 2   gabapentin  (NEURONTIN ) 300 MG capsule TAKE 2 CAPSULES BY MOUTH IN THE  MORNING AND 3 CAPSULES BY MOUTH  AT BEDTIME (Patient taking differently: TAKE 2 CAPSULES BY MOUTH IN THE  MORNING AND 3 CAPSULES BY MOUTH  AT BEDTIME) 500 capsule 1   glucose blood test strip Use as instructed 200 each 5   hydrocortisone cream 1 % Apply 1 application topically 2 (two) times daily.     lisinopril  (ZESTRIL ) 40 MG tablet TAKE 1 TABLET BY  MOUTH DAILY 100 tablet 2   loratadine  (CLARITIN ) 10 MG tablet Take 10 mg by mouth daily.     metoprolol  succinate (TOPROL -XL) 50 MG 24 hr tablet TAKE 1 TABLET BY MOUTH TWICE  DAILY WITH OR IMMEDIATELY  FOLLOWING A MEAL 200 tablet 2   Multiple Vitamin (MULTIVITAMIN) tablet Take 1 tablet by mouth daily.     nystatin  (MYCOSTATIN ) 100000 UNIT/ML suspension TAKE  5 ML BY MOUTH 4 TIMES DAILY FOR 7 DAYS 250 mL 0   nystatin  cream (MYCOSTATIN ) Apply 1 application topically as needed for dry skin. 30 g 1   omeprazole  (PRILOSEC) 20 MG capsule Take 1 capsule (20 mg total) by mouth daily. 90 capsule 1   ondansetron  (ZOFRAN -ODT) 8 MG disintegrating tablet DISSOLVE 1 TABLET ON TOP OF THE  TONGUE EVERY 12 HOURS AS NEEDED  FOR NAUSEA 20 tablet 1   polycarbophil (FIBERCON) 625 MG tablet Take by mouth daily.     traMADol  (ULTRAM ) 50 MG tablet TAKE 1 TABLET BY MOUTH AT  BEDTIME AS NEEDED 30 tablet 1   triamcinolone  cream (KENALOG ) 0.1 % APPLY 1 APPLICATION TOPICALLY  DAILY AS NEEDED 60 g 1   TRULICITY  0.75 MG/0.5ML SOAJ INJECT 0.75MG  (0.5ML) UNDER THE SKIN ONCE A WEEK. 6 mL 0   No facility-administered medications prior to visit.    PAST MEDICAL HISTORY: Past Medical History:  Diagnosis Date   Allergy    Anal fissure    Anxiety    Arthritis    Asthma    Cataract 2010    left   Colon polyps    diverticulosis  03-2011/2005   Depression    Diabetes mellitus 2012   T2DM   Diverticulosis    Fatty liver 04/2011   GERD (gastroesophageal reflux disease)    Hyperlipidemia    Hypertension severe    Migraine    Obesity    OSA (obstructive sleep apnea)    C-Pap   Pacemaker Medtronic    Sinoatrial node dysfunction (HCC)    syncope    PAST SURGICAL HISTORY: Past Surgical History:  Procedure Laterality Date   ABDOMINAL HYSTERECTOMY  2000   total   CARDIAC CATHETERIZATION N/A 07/20/2016   Procedure: Left Heart Cath and Coronary Angiography;  Surgeon: Peter M Swaziland, MD;  [facility] INVASIVE CV LAB;   Service: Cardiovascular;  Laterality: N/A;   CATARACT EXTRACTION Left    CORONARY ARTERY BYPASS GRAFT N/A 07/22/2016   Procedure: CORONARY ARTERY BYPASS GRAFTING (CABG) x three , using left internal mammary artery and right leg greater saphenous vein harvested endoscopically;  Surgeon: Heriberto London, MD;  Location: [facility] OR;  Service: Open Heart Surgery;  Laterality: N/A;   EYE SURGERY Right 2010   MULTIPLE TOOTH EXTRACTIONS     OOPHORECTOMY     PACEMAKER PLACEMENT     T/A right knee     TEE WITHOUT CARDIOVERSION N/A 07/22/2016   Procedure: TRANSESOPHAGEAL ECHOCARDIOGRAM (TEE);  Surgeon: Heriberto London, MD;  Location: [facility] OR;  Service: Open Heart Surgery;  Laterality: N/A;   TONSILLECTOMY      FAMILY HISTORY: Family History  Problem Relation Age of Onset   Heart disease Mother    Cancer Mother 37       lung   Breast cancer Mother 22   Heart disease Father    Heart attack Sister    Diabetes Sister        type 2   Atrial fibrillation Sister    Sleep apnea Sister    Heart attack Brother    Heart attack Brother    Cancer Maternal Grandmother        breast   Breast cancer Maternal Grandmother    Leukemia Paternal Grandmother        neoplast   Aneurysm Paternal Grandfather        abdominal (stomach)   Colon cancer Neg Hx     SOCIAL HISTORY: Social History   Socioeconomic History   Marital status: Single    Spouse name: Not on file   Number of children: 0   Years of education: Not on file   Highest education level: Not on file  Occupational History   Occupation: RETIRED/chef    Employer: [employer]  Tobacco Use   Smoking status: Former    Current packs/day: 0.00    Types: Cigarettes    Quit date: 1995    Years since quitting: 30.2   Smokeless tobacco: Never   Tobacco comments:    Quit 20 yrs ago  Vaping Use   Vaping status: Never Used  Substance and Sexual Activity   Alcohol use: No    Alcohol/week: 0.0 standard drinks of alcohol   Drug use: No    Sexual activity: Yes    Birth control/protection: Post-menopausal, Other-see comments    Comment: Monogamous   Other Topics Concern   Not on file  Social History Narrative   Single. Exercise: No. Education: College.      Lives alone   Social Drivers of Health   Financial Resource Strain: Low Risk  (01/11/2024)   Overall Financial Resource Strain (CARDIA)    Difficulty of Paying Living Expenses: Not very hard  Food Insecurity: No Food Insecurity (01/11/2024)   Hunger Vital Sign    Worried About Running Out of Food in the Last Year: Never true    Ran Out of Food in the Last Year: Never true  Transportation Needs: No Transportation Needs (01/11/2024)   PRAPARE - Administrator, Civil Service (Medical): No    Lack of Transportation (Non-Medical): No  Physical Activity: Insufficiently Active (01/11/2024)   Exercise Vital Sign    Days of Exercise per Week: 2 days    Minutes of Exercise per Session: 30 min  Stress: No Stress Concern Present (01/11/2024)   [employer] of Occupational Health - Occupational Stress Questionnaire    Feeling of Stress : Not at all  Social Connections: Socially Isolated (01/11/2024)   Social Connection and Isolation Panel [NHANES]    Frequency of Communication with Friends and Family: More than three times a week    Frequency of Social Gatherings  with Friends and Family: More than three times a week    Attends Religious Services: Never    Active Member of Clubs or Organizations: No    Attends Banker Meetings: Never    Marital Status: Never married  Intimate Partner Violence: Patient Unable To Answer (01/11/2024)   Humiliation, Afraid, Rape, and Kick questionnaire    Fear of Current or Ex-Partner: Patient unable to answer    Emotionally Abused: Patient unable to answer    Physically Abused: Patient unable to answer    Sexually Abused: Patient unable to answer      PHYSICAL EXAM   Generalized: Well developed, in no acute distress   Chest: Lungs clear to auscultation bilaterally  Neurological examination  Mentation: Alert oriented to time, place, history taking. Follows all commands speech and language fluent Cranial nerve II-XII: Facial symmetry noted   DIAGNOSTIC DATA (LABS, IMAGING, TESTING) - I reviewed patient records, labs, notes, testing and imaging myself where available.  Lab Results  Component Value Date   WBC 4.9 10/19/2023   HGB 13.5 10/19/2023   HCT 39.7 10/19/2023   MCV 95.7 10/19/2023   PLT 223.0 10/19/2023      Component Value Date/Time   NA 141 10/19/2023 1030   NA 142 07/06/2023 1252   K 4.0 10/19/2023 1030   CL 102 10/19/2023 1030   CO2 32 10/19/2023 1030   GLUCOSE 123 (H) 10/19/2023 1030   BUN 15 10/19/2023 1030   BUN 19 07/06/2023 1252   CREATININE 0.86 10/19/2023 1030   CREATININE 0.92 07/20/2016 0956   CALCIUM  9.8 10/19/2023 1030   CALCIUM  9.6 07/21/2016 0420   PROT 7.4 10/19/2023 1030   PROT 7.0 12/16/2021 1048   ALBUMIN  4.7 10/19/2023 1030   ALBUMIN  4.6 12/16/2021 1048   AST 22 10/19/2023 1030   ALT 13 10/19/2023 1030   ALKPHOS 88 10/19/2023 1030   BILITOT 0.5 10/19/2023 1030   BILITOT 0.4 12/16/2021 1048   GFRNONAA >60 01/19/2023 1109   GFRNONAA 67 07/20/2016 0956   GFRAA 72 10/30/2020 1156   GFRAA 77 07/20/2016 0956   Lab Results  Component Value Date   CHOL 133 10/19/2023   HDL 44.10 10/19/2023   LDLCALC 39 10/19/2023   LDLDIRECT 47.0 04/18/2023   TRIG 247.0 (H) 10/19/2023   CHOLHDL 3 10/19/2023   Lab Results  Component Value Date   HGBA1C 6.1 10/19/2023   No results found for: "VITAMINB12" Lab Results  Component Value Date   TSH 1.32 11/10/2022      ASSESSMENT AND PLAN 68 y.o. year old female  has a past medical history of Allergy, Anal fissure, Anxiety, Arthritis, Asthma, Cataract (2010 ), Colon polyps, Depression, Diabetes mellitus (2012), Diverticulosis, Fatty liver (04/2011), GERD (gastroesophageal reflux disease), Hyperlipidemia, Hypertension  severe, Migraine, Obesity, OSA (obstructive sleep apnea), Pacemaker Medtronic, and Sinoatrial node dysfunction (HCC). here with:  OSA on CPAP  - CPAP compliance excellent - Good treatment of AHI  - Encourage patient to use CPAP nightly and > 4 hours each night - F/U in 1 year or sooner if needed    Clem Currier, MSN, NP-C 01/17/2024, 2:45 PM [facility] [address] [place], [place] [address] [phone]

## 2024-01-19 NOTE — Progress Notes (Signed)
 New, Doristine Mango, RN; Natale Lay; 1 other Received, thank you!     Previous Messages    ----- Message ----- From: Guy Begin, RN Sent: 01/18/2024   8:11 AM EDT To: Kathyrn Sheriff; Santina Evans; Kathe Becton; * Subject: new cpap supplies                              New order in EPIC  [name] [name] "Fallon" Female, 68 y.o., 1956/06/06 MRN: [id number]   Thanks Sa

## 2024-01-21 NOTE — Progress Notes (Unsigned)
 Cardiology Clinic Note   Date: 01/23/2024 ID: Veronika Heard, DOB 05/14/1956, MRN [id number]  [facility] Providers Cardiologist:  Sammy Crisp, MD Electrophysiologist:  Richardo Chandler, MD {  Chief Complaint   [name] [name] is a 68 y.o. female who presents to the clinic today for routine follow up.   Patient Profile   [name] [name] is followed by Dr. Nolan Battle and Dr. Rodolfo Clan for the history outlined below.      Past medical history significant for: CAD. LHC 07/20/2016 (STEMI): Ostial first septal to first septal 90%.  Proximal LAD 90%.  Mid LAD 80%.  Distal LAD 70%.  Ostial to proximal LCx 100%.  Second RPLB 100%.  Normal LV systolic function.  Moderately elevated LVEDP.  CT surgery consult recommended. CABG x 3 07/22/2016: LIMA to LAD, SVG to diagonal, SVG to OM. Nuclear stress test 06/22/2021: Normal, low risk study. Chronic HFpEF. Echo 07/21/2016: EF 60 to 65%.  No RWMA.  Grade I DD.  Mild LAE.  Normal RV size/function. Sinus node dysfunction. PPM placement 09/10/2008. Remote device check 12/13/2023: Normal device function.  Battery status is good.  Lead measurements unchanged. A-fib postop CABG. Hypertension. Hyperlipidemia. Lipid panel 10/19/2023: LDL 39, HDL 44, TG 247, total 133. OSA. GERD. Nonalcoholic fatty liver disease. T2DM.  In summary, patient has a history of sinus node dysfunction s/p PPM in December 2009.  In October 2017 she had a STEMI and was found to have severe three-vessel obstructive CAD.  She underwent CABG x 3.  Echo at that time showed normal LV function as detailed above.  She developed postoperative A-fib treated with amiodarone.  Patient was last seen in the office by Dr. Rodolfo Clan on 06/21/2023 for pacemaker follow-up.  Her BP was elevated and she was volume overloaded.  Lasix was increased to 40 mg daily.  She was asked to discuss the addition of SGLT2i to her diabetic regimen with her PCP.     History of Present Illness    Today, patient is doing  well. Patient denies shortness of breath, dyspnea on exertion, lower extremity edema, orthopnea or PND. No chest pain, pressure, or tightness. No palpitations. She walks three times a week for exercise and tries to get a minimum of 5000 steps a day. She is having difficulty affording Praluent with the new medicare changes. She takes atorvastatin but it causes her generalized malaise and myopathy.     ROS: All other systems reviewed and are otherwise negative except as noted in History of Present Illness.  EKGs/Labs Reviewed    EKG Interpretation Date/Time:  Monday January 23 2024 08:28:20 EDT Ventricular Rate:  54 PR Interval:  184 QRS Duration:  100 QT Interval:  500 QTC Calculation: 474 R Axis:   -13  Text Interpretation: Sinus bradycardia When compared with ECG of 21-Jun-2023 09:16, PR interval has decreased Right bundle branch block is no longer Present Confirmed by Morey Ar [phone]) on 01/23/2024 8:45:05 AM   10/19/2023: ALT 13; AST 22; BUN 15; Creatinine, Ser 0.86; Potassium 4.0; Sodium 141   10/19/2023: Hemoglobin 13.5; WBC 4.9    Physical Exam    VS:  BP 136/88   Pulse (!) 54   Ht 5\' 2"  (1.575 m)   Wt 186 lb 3.2 oz (84.5 kg)   SpO2 96%   BMI 34.06 kg/m  , BMI Body mass index is 34.06 kg/m.  GEN: Well nourished, well developed, in no acute distress. Neck: No JVD or carotid bruits. Cardiac:  RRR. No murmurs. No rubs  or gallops.   Respiratory:  Respirations regular and unlabored. Clear to auscultation without rales, wheezing or rhonchi. GI: Soft, nontender, nondistended. Extremities: Radials/DP/PT 2+ and equal bilaterally. No clubbing or cyanosis. No edema.  Skin: Warm and dry, no rash. Neuro: Strength intact.  Assessment & Plan   CAD S/p CABG x 13 July 2016.  Nuclear stress test September 2022 was a normal low risk study.  Patient denies chest pain, pressure or tightness. She walks 3 times a week for exercise and tries to get 5000 steps a day.  -Continue  atorvastatin, Plavix, Toprol.  Chronic HFpEF Echo October 2017 showed normal LV/RV function, Grade I DD, mild LAE.  Patient denies lower extremity edema, DOE, orthopnea or PND.  Euvolemic and well compensated on exam. -Continue Toprol, lisinopril, Lasix.  Sinus node dysfunction PPM placement December 2009.  Remote device check March 2025 showed normal device function with good battery status and unchanged lead measurements.  Patient denies lightheadedness, dizziness, presyncope or syncope.  -Continue to follow with EP.  Hypertension BP today 136/88. No headaches or dizziness reported.  -Continue Toprol, lisinopril.  Hyperlipidemia/statin myopathy LDL 39 January 2025, at goal. Patient is having difficulty affording Praluent with the new medicare changes. She continues to take atorvastatin but it causes generalized malaise and myopathy.  -Stop Praluent.  -Add Zetia 10 mg daily.  -Decrease atorvastatin to 40 mg. -Repeat lipid panel and LFTs in 2 months.   Disposition: Stop Praluent. Decrease atorvastatin to 40 mg daily. Add Zetia 10 mg daily. Lipid panel and LFTs in 2 months. Return in 1 year or sooner as needed.          Signed, Lonell Rives. Malarie Tappen, DNP, NP-C

## 2024-01-23 ENCOUNTER — Ambulatory Visit: Attending: Student | Admitting: Student

## 2024-01-23 ENCOUNTER — Encounter: Payer: Self-pay | Admitting: Student

## 2024-01-23 VITALS — BP 136/88 | HR 54 | Ht 62.0 in | Wt 186.2 lb

## 2024-01-23 DIAGNOSIS — I5032 Chronic diastolic (congestive) heart failure: Secondary | ICD-10-CM | POA: Diagnosis not present

## 2024-01-23 DIAGNOSIS — I10 Essential (primary) hypertension: Secondary | ICD-10-CM

## 2024-01-23 DIAGNOSIS — T466X5A Adverse effect of antihyperlipidemic and antiarteriosclerotic drugs, initial encounter: Secondary | ICD-10-CM | POA: Diagnosis not present

## 2024-01-23 DIAGNOSIS — I495 Sick sinus syndrome: Secondary | ICD-10-CM

## 2024-01-23 DIAGNOSIS — I2510 Atherosclerotic heart disease of native coronary artery without angina pectoris: Secondary | ICD-10-CM

## 2024-01-23 DIAGNOSIS — G720 Drug-induced myopathy: Secondary | ICD-10-CM | POA: Diagnosis not present

## 2024-01-23 DIAGNOSIS — Z950 Presence of cardiac pacemaker: Secondary | ICD-10-CM | POA: Diagnosis not present

## 2024-01-23 DIAGNOSIS — E785 Hyperlipidemia, unspecified: Secondary | ICD-10-CM

## 2024-01-23 MED ORDER — EZETIMIBE 10 MG PO TABS: 10.0000 mg | ORAL_TABLET | Freq: Every day | ORAL | 3 refills | Status: AC

## 2024-01-23 NOTE — Patient Instructions (Addendum)
 Medication Instructions:  Your physician recommends the following medication changes.  STOP TAKING: Praulent 40 mg daily   START TAKING: Zetia 10 mg daily   DECREASE: Atorvastatin to 40 mg daily  *If you need a refill on your cardiac medications before your next appointment, please call your pharmacy*  Lab Work: Your provider would like for you to return in two months  to have the following labs drawn: lipid, LFTs.   Please go to [facility] [address] ([facility] [address], [place] [address] You do not need an appointment.  They are open from 8 am- 4:30 pm.  Lunch from 1:00 pm- 2:00 pm You DO  need to be fasting.   You may also go to one of the following [facility]:  [address] [place], [place] [address] Phone: [phone] Lab hours: Mon-Fri 8 am- 5 pm    Lunch 12 pm- 1 pm  [address] [place],  [place]  [address]  [place]  Phone: [phone] Lab hours: 7 am- 4 pm Lunch 12 pm-1 pm   [address] [place],  [place]  [address]  [place]  Phone: [phone] Lab hours: Mon-Fri 8 am- 5 pm    Lunch 12 pm- 1 pm  If you have labs (blood work) drawn today and your tests are completely normal, you will receive your results only by: MyChart Message (if you have MyChart) OR A paper copy in the mail If you have any lab test that is abnormal or we need to change your treatment, we will call you to review the results.  Testing/Procedures: No test ordered today   Follow-Up: At [facility], you and your health needs are our priority.  As part of our continuing mission to provide you with exceptional heart care, our providers are all part of one team.  This team includes your primary Cardiologist (physician) and Advanced Practice Providers or APPs (Physician Assistants and Nurse Practitioners) who all work together to provide you with the care you need, when you need it.  Your next appointment:   1 year(s)  Provider:   You may see Sammy Crisp, MD  or one of the following Advanced Practice Providers on your designated Care Team:   Laneta Pintos, NP Gildardo Labrador, PA-C Varney Gentleman, PA-C Cadence Olancha, PA-C Ronald Cockayne, NP Morey Ar, NP

## 2024-02-13 ENCOUNTER — Other Ambulatory Visit: Payer: Self-pay | Admitting: Emergency Medicine

## 2024-02-13 DIAGNOSIS — B370 Candidal stomatitis: Secondary | ICD-10-CM

## 2024-02-13 DIAGNOSIS — K130 Diseases of lips: Secondary | ICD-10-CM

## 2024-02-18 ENCOUNTER — Other Ambulatory Visit: Payer: Self-pay | Admitting: Emergency Medicine

## 2024-02-18 DIAGNOSIS — E790 Hyperuricemia without signs of inflammatory arthritis and tophaceous disease: Secondary | ICD-10-CM

## 2024-03-09 ENCOUNTER — Ambulatory Visit (INDEPENDENT_AMBULATORY_CARE_PROVIDER_SITE_OTHER): Payer: Medicare Other

## 2024-03-09 DIAGNOSIS — I495 Sick sinus syndrome: Secondary | ICD-10-CM | POA: Diagnosis not present

## 2024-03-12 LAB — CUP PACEART REMOTE DEVICE CHECK
Battery Impedance: 3297 Ohm
Battery Remaining Longevity: 25 mo
Battery Voltage: 2.71 V
Brady Statistic AP VP Percent: 0 %
Brady Statistic AP VS Percent: 0 %
Brady Statistic AS VP Percent: 1 %
Brady Statistic AS VS Percent: 99 %
Date Time Interrogation Session: 20250530134748
Implantable Lead Connection Status: 753985
Implantable Lead Connection Status: 753985
Implantable Lead Implant Date: 20091201
Implantable Lead Implant Date: 20091201
Implantable Lead Location: 753859
Implantable Lead Location: 753860
Implantable Lead Model: 5076
Implantable Lead Model: 5076
Implantable Pulse Generator Implant Date: 20091201
Lead Channel Impedance Value: 425 Ohm
Lead Channel Impedance Value: 540 Ohm
Lead Channel Pacing Threshold Amplitude: 0.375 V
Lead Channel Pacing Threshold Amplitude: 1.125 V
Lead Channel Pacing Threshold Pulse Width: 0.4 ms
Lead Channel Pacing Threshold Pulse Width: 0.4 ms
Lead Channel Setting Pacing Amplitude: 2 V
Lead Channel Setting Pacing Amplitude: 2.5 V
Lead Channel Setting Pacing Pulse Width: 0.4 ms
Lead Channel Setting Sensing Sensitivity: 2.8 mV
Zone Setting Status: 755011
Zone Setting Status: 755011

## 2024-03-19 ENCOUNTER — Ambulatory Visit: Payer: Self-pay | Admitting: Cardiology

## 2024-03-29 ENCOUNTER — Other Ambulatory Visit: Payer: Self-pay | Admitting: Emergency Medicine

## 2024-03-29 DIAGNOSIS — B370 Candidal stomatitis: Secondary | ICD-10-CM

## 2024-03-29 DIAGNOSIS — K130 Diseases of lips: Secondary | ICD-10-CM

## 2024-04-11 ENCOUNTER — Other Ambulatory Visit: Payer: Self-pay | Admitting: Internal Medicine

## 2024-04-11 ENCOUNTER — Other Ambulatory Visit: Payer: Self-pay | Admitting: Emergency Medicine

## 2024-04-18 ENCOUNTER — Ambulatory Visit (INDEPENDENT_AMBULATORY_CARE_PROVIDER_SITE_OTHER): Payer: Medicare Other | Admitting: Emergency Medicine

## 2024-04-18 ENCOUNTER — Encounter: Payer: Self-pay | Admitting: Emergency Medicine

## 2024-04-18 VITALS — BP 118/72 | HR 56 | Temp 97.7°F | Ht 62.0 in | Wt 193.0 lb

## 2024-04-18 DIAGNOSIS — Z7985 Long-term (current) use of injectable non-insulin antidiabetic drugs: Secondary | ICD-10-CM | POA: Diagnosis not present

## 2024-04-18 DIAGNOSIS — I152 Hypertension secondary to endocrine disorders: Secondary | ICD-10-CM | POA: Diagnosis not present

## 2024-04-18 DIAGNOSIS — Z8739 Personal history of other diseases of the musculoskeletal system and connective tissue: Secondary | ICD-10-CM | POA: Diagnosis not present

## 2024-04-18 DIAGNOSIS — G4733 Obstructive sleep apnea (adult) (pediatric): Secondary | ICD-10-CM | POA: Diagnosis not present

## 2024-04-18 DIAGNOSIS — E1159 Type 2 diabetes mellitus with other circulatory complications: Secondary | ICD-10-CM

## 2024-04-18 DIAGNOSIS — E785 Hyperlipidemia, unspecified: Secondary | ICD-10-CM

## 2024-04-18 DIAGNOSIS — E1169 Type 2 diabetes mellitus with other specified complication: Secondary | ICD-10-CM | POA: Diagnosis not present

## 2024-04-18 DIAGNOSIS — I5032 Chronic diastolic (congestive) heart failure: Secondary | ICD-10-CM

## 2024-04-18 DIAGNOSIS — E1142 Type 2 diabetes mellitus with diabetic polyneuropathy: Secondary | ICD-10-CM

## 2024-04-18 DIAGNOSIS — I25119 Atherosclerotic heart disease of native coronary artery with unspecified angina pectoris: Secondary | ICD-10-CM

## 2024-04-18 LAB — POCT GLYCOSYLATED HEMOGLOBIN (HGB A1C): Hemoglobin A1C: 6.6 % — AB (ref 4.0–5.6)

## 2024-04-18 NOTE — Progress Notes (Signed)
 [name] [name] 68 y.o.   Chief Complaint  Patient presents with   Follow-up    Patient here for 6 month f/u for HTN/ DM. Patient mentions she is having Lipid panel and hepatic panel las work done tomorrow put in by her cardiologist. Patient also wanted to discuss her issue with her lip     HISTORY OF PRESENT ILLNESS: This is a 68 y.o. female here for 48-month follow-up of chronic medical conditions including hypertension diabetes Scheduled for lipid profile and CMP tomorrow Recently diagnosed with lip infection.  Has appointment to see dermatologist next month. No other complaints or medical concerns today. Lab Results  Component Value Date   HGBA1C 6.1 10/19/2023   Wt Readings from Last 3 Encounters:  04/18/24 193 lb (87.5 kg)  01/23/24 186 lb 3.2 oz (84.5 kg)  01/11/24 185 lb (83.9 kg)   BP Readings from Last 3 Encounters:  04/18/24 118/72  01/23/24 136/88  01/11/24 (!) 140/100    Assessment & Plan    CAD S/p CABG x 13 July 2016.  Nuclear stress test September 2022 was a normal low risk study.  Patient denies chest pain, pressure or tightness. She walks 3 times a week for exercise and tries to get 5000 steps a day.  -Continue atorvastatin , Plavix , Toprol .   Chronic HFpEF Echo October 2017 showed normal LV/RV function, Grade I DD, mild LAE.  Patient denies lower extremity edema, DOE, orthopnea or PND.  Euvolemic and well compensated on exam. -Continue Toprol , lisinopril , Lasix .   Sinus node dysfunction PPM placement December 2009.  Remote device check March 2025 showed normal device function with good battery status and unchanged lead measurements.  Patient denies lightheadedness, dizziness, presyncope or syncope.  -Continue to follow with EP.   Hypertension BP today 136/88. No headaches or dizziness reported.  -Continue Toprol , lisinopril .   Hyperlipidemia/statin myopathy LDL 39 January 2025, at goal. Patient is having difficulty affording Praluent  with the new  medicare changes. She continues to take atorvastatin  but it causes generalized malaise and myopathy.  -Stop Praluent .  -Add Zetia  10 mg daily.  -Decrease atorvastatin  to 40 mg. -Repeat lipid panel and LFTs in 2 months.    Disposition: Stop Praluent . Decrease atorvastatin  to 40 mg daily. Add Zetia  10 mg daily. Lipid panel and LFTs in 2 months. Return in 1 year or sooner as needed.            Signed, Barnie HERO. Wittenborn, DNP, NP-C HPI   Prior to Admission medications   Medication Sig Start Date End Date Taking? Authorizing Provider  Accu-Chek Softclix Lancets lancets USE AS DIRECTED 05/10/21  Yes Glenn Christo, Emil Schanz, MD  albuterol  (VENTOLIN  HFA) 108 (90 Base) MCG/ACT inhaler Inhale 1-2 puffs into the lungs every 6 (six) hours as needed for wheezing or shortness of breath. 04/17/19  Yes Purcell Emil Schanz, MD  allopurinol  (ZYLOPRIM ) 100 MG tablet Take 1 tablet by mouth once daily 02/18/24  Yes Cayce Quezada, Emil Schanz, MD  Blood Glucose Monitoring Suppl (ACCU-CHEK GUIDE) w/Device KIT 1 Device by Does not apply route in the morning, at noon, and at bedtime. 05/24/22  Yes Kelsi Benham, Emil Schanz, MD  cetirizine (ZYRTEC) 10 MG tablet Take 10 mg by mouth daily.   Yes [provider]  citalopram  (CELEXA ) 20 MG tablet TAKE 1 TABLET BY MOUTH DAILY 10/04/23  Yes Jason Frisbee, Emil Schanz, MD  clopidogrel  (PLAVIX ) 75 MG tablet TAKE 1 TABLET BY MOUTH DAILY 04/12/24  Yes End, Lonni, MD  colchicine  0.6 MG tablet Take  1 tablet (0.6 mg total) by mouth 2 (two) times daily for 5 days. Patient taking differently: Take 0.6 mg by mouth as needed (gout). 04/18/23 04/18/24 Yes Deniese Oberry, Emil Schanz, MD  dicyclomine  (BENTYL ) 20 MG tablet Take 20 mg by mouth 3 (three) times daily. 07/11/21  Yes [provider]  ezetimibe  (ZETIA ) 10 MG tablet Take 1 tablet (10 mg total) by mouth daily. 01/23/24 04/22/24 Yes Wittenborn, Barnie, NP  fluconazole  (DIFLUCAN ) 150 MG tablet Take 1 tablet by mouth once daily for  7 days 03/29/24  Yes Izaiah Tabb, Emil Schanz, MD  fluticasone  (FLONASE ) 50 MCG/ACT nasal spray USE 2 SPRAYS IN BOTH NOSTRILS  DAILY 09/24/23  Yes Victorine Mcnee Jose, MD  fluticasone  (FLOVENT  HFA) 110 MCG/ACT inhaler Inhale 2 puffs into the lungs 2 (two) times daily as needed.   Yes [provider]  furosemide  (LASIX ) 20 MG tablet Take 2 tablets (40 mg total) by mouth daily. 06/21/23  Yes Fernande Elspeth BROCKS, MD  glucose blood test strip Use as instructed 06/01/22  Yes Anacarolina Evelyn Jose, MD  hydrocortisone cream 1 % Apply 1 application topically 2 (two) times daily.   Yes [provider]  lisinopril  (ZESTRIL ) 40 MG tablet TAKE 1 TABLET BY MOUTH DAILY 04/12/24  Yes Trayce Maino, Emil Schanz, MD  loratadine  (CLARITIN ) 10 MG tablet Take 10 mg by mouth daily.   Yes [provider]  metoprolol  succinate (TOPROL -XL) 50 MG 24 hr tablet TAKE 1 TABLET BY MOUTH TWICE  DAILY WITH OR IMMEDIATELY  FOLLOWING A MEAL 11/15/23  Yes Anders Hohmann, Emil Schanz, MD  Multiple Vitamin (MULTIVITAMIN) tablet Take 1 tablet by mouth daily. 08/30/16  Yes Zimmerman, Donielle M, PA-C  nystatin  (MYCOSTATIN ) 100000 UNIT/ML suspension TAKE 5 ML BY MOUTH  4 TIMES DAILY FOR 7 DAYS 03/29/24  Yes Iwao Shamblin, Emil Schanz, MD  nystatin  cream (MYCOSTATIN ) Apply 1 application topically as needed for dry skin. 04/28/20  Yes Kandis Henry, Emil Schanz, MD  omeprazole  (PRILOSEC) 20 MG capsule Take 1 capsule (20 mg total) by mouth daily. 12/15/16  Yes English, Corean D, PA  ondansetron  (ZOFRAN -ODT) 8 MG disintegrating tablet DISSOLVE 1 TABLET ON TOP OF THE  TONGUE EVERY 12 HOURS AS NEEDED  FOR NAUSEA 08/21/23  Yes Gurkaran Rahm, Emil Schanz, MD  polycarbophil (FIBERCON) 625 MG tablet Take by mouth daily.   Yes [provider]  traMADol  (ULTRAM ) 50 MG tablet TAKE 1 TABLET BY MOUTH AT  BEDTIME AS NEEDED 12/15/23  Yes Ettie Krontz, Emil Schanz, MD  triamcinolone  cream (KENALOG ) 0.1 % APPLY 1 APPLICATION TOPICALLY  DAILY AS NEEDED 12/27/22  Yes  Fabrice Dyal, Emil Schanz, MD  TRULICITY  0.75 MG/0.5ML SOAJ INJECT 0.75MG  (0.5ML) UNDER THE SKIN ONCE A WEEK. 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) 06/09/2022   Chronic left shoulder pain 10/28/2021   Hyperlipidemia associated with type 2 diabetes mellitus (HCC) 12/10/2020   Trigger finger,  right middle finger 11/07/2019   Hypertension associated with diabetes (HCC) 10/29/2019   Chronic heart failure with preserved ejection fraction (HFpEF) (HCC) 05/25/2019   Coronary artery disease involving native coronary artery of native heart with angina pectoris (HCC) 02/14/2018   Morbid obesity (HCC) 02/14/2018   Irritable bowel syndrome 06/28/2017   S/P CABG x 3 07/22/2016   Multiple vessel coronary artery disease 07/21/2016   Environmental allergies 04/26/2016   Sinus node dysfunction (HCC) 04/26/2016   Pacemaker Medtronic    OSA (obstructive sleep apnea) 05/25/2011   Hx of adenomatous colonic polyps 05/25/2011   Migraine 05/25/2011   Diabetes mellitus type 2, uncontrolled, with complications 05/25/2011   GERD (gastroesophageal reflux disease) 05/25/2011   NAFLD (nonalcoholic fatty liver disease) 91/85/7987   Hyperlipidemia LDL goal <70 05/25/2011   Essential hypertension 09/16/2009   DIASTOLIC HEART FAILURE, CHRONIC 09/16/2009    Past Medical History:  Diagnosis Date   Allergy    Anal fissure    Anxiety    Arthritis    Asthma    Cataract 2010     left   Colon polyps    diverticulosis  03-2011/2005   Depression    Diabetes mellitus 2012   T2DM   Diverticulosis    Fatty liver 04/2011   GERD (gastroesophageal reflux disease)    Hyperlipidemia    Hypertension severe    Migraine    Obesity    OSA (obstructive sleep apnea)    C-Pap   Pacemaker Medtronic    Sinoatrial node dysfunction (HCC)    syncope    Past Surgical History:  Procedure Laterality Date   ABDOMINAL HYSTERECTOMY  2000   total   CARDIAC CATHETERIZATION N/A 07/20/2016   Procedure: Left Heart Cath and Coronary Angiography;  Surgeon: Peter M Swaziland, MD;  Location: [facility] INVASIVE CV LAB;  Service: Cardiovascular;  Laterality: N/A;   CATARACT EXTRACTION Left    CORONARY ARTERY BYPASS GRAFT N/A 07/22/2016   Procedure: CORONARY ARTERY BYPASS GRAFTING (CABG) x three , using left internal mammary artery and right leg greater saphenous vein harvested endoscopically;  Surgeon: Maude Fleeta Ochoa, MD;  Location: [facility] OR;  Service: Open Heart Surgery;  Laterality: N/A;   EYE SURGERY Right 2010   MULTIPLE TOOTH EXTRACTIONS     OOPHORECTOMY     PACEMAKER PLACEMENT     T/A right knee     TEE WITHOUT CARDIOVERSION N/A 07/22/2016   Procedure: TRANSESOPHAGEAL ECHOCARDIOGRAM (TEE);  Surgeon: Maude Fleeta Ochoa, MD;  Location: [name] OR;  Service: Open Heart Surgery;  Laterality: N/A;   TONSILLECTOMY      Social History   Socioeconomic History   Marital status: Single    Spouse name: Not on file   Number of children: 0   Years of education: Not on file   Highest education level: Not on file  Occupational History   Occupation: RETIRED/chef    Employer: [employer]  Tobacco Use   Smoking status: Former    Current packs/day: 0.00    Types: Cigarettes    Quit date: 1995    Years since quitting: 30.5   Smokeless tobacco: Never   Tobacco comments:    Quit 20 yrs ago  Vaping Use   Vaping status: Never Used  Substance and Sexual Activity   Alcohol use: No    Alcohol/week:  0.0 standard drinks of alcohol   Drug use: No   Sexual activity: Yes    Birth control/protection: Post-menopausal, Other-see comments    Comment:  Monogamous   Other Topics Concern   Not on file  Social History Narrative   Single. Exercise: No. Education: College.      Lives alone   Social Drivers of Health   Financial Resource Strain: Low Risk  (01/11/2024)   Overall Financial Resource Strain (CARDIA)    Difficulty of Paying Living Expenses: Not very hard  Food Insecurity: No Food Insecurity (01/11/2024)   Hunger Vital Sign    Worried About Running Out of Food in the Last Year: Never true    Ran Out of Food in the Last Year: Never true  Transportation Needs: No Transportation Needs (01/11/2024)   PRAPARE - Administrator, Civil Service (Medical): No    Lack of Transportation (Non-Medical): No  Physical Activity: Insufficiently Active (01/11/2024)   Exercise Vital Sign    Days of Exercise per Week: 2 days    Minutes of Exercise per Session: 30 min  Stress: No Stress Concern Present (01/11/2024)   [employer] of Occupational Health - Occupational Stress Questionnaire    Feeling of Stress : Not at all  Social Connections: Socially Isolated (01/11/2024)   Social Connection and Isolation Panel    Frequency of Communication with Friends and Family: More than three times a week    Frequency of Social Gatherings with Friends and Family: More than three times a week    Attends Religious Services: Never    Database administrator or Organizations: No    Attends Banker Meetings: Never    Marital Status: Never married  Intimate Partner Violence: Patient Unable To Answer (01/11/2024)   Humiliation, Afraid, Rape, and Kick questionnaire    Fear of Current or Ex-Partner: Patient unable to answer    Emotionally Abused: Patient unable to answer    Physically Abused: Patient unable to answer    Sexually Abused: Patient unable to answer    Family History  Problem Relation  Age of Onset   Heart disease Mother    Cancer Mother 60       lung   Breast cancer Mother 31   Heart disease Father    Heart attack Sister    Diabetes Sister        type 2   Atrial fibrillation Sister    Sleep apnea Sister    Heart attack Brother    Heart attack Brother    Cancer Maternal Grandmother        breast   Breast cancer Maternal Grandmother    Leukemia Paternal Grandmother        neoplast   Aneurysm Paternal Grandfather        abdominal (stomach)   Colon cancer Neg Hx      Review of Systems  Constitutional: Negative.  Negative for chills and fever.  HENT: Negative.  Negative for congestion and sore throat.   Respiratory: Negative.  Negative for cough and shortness of breath.   Cardiovascular: Negative.  Negative for chest pain and palpitations.  Gastrointestinal:  Negative for abdominal pain, diarrhea, nausea and vomiting.  Genitourinary: Negative.  Negative for dysuria and hematuria.  Skin: Negative.  Negative for rash.  Neurological: Negative.  Negative for dizziness and headaches.  All other systems reviewed and are negative.   Vitals:   04/18/24 0935  BP: 118/72  Pulse: (!) 56  Temp: 97.7 F (36.5 C)  SpO2: 97%    Physical Exam Vitals reviewed.  Constitutional:      Appearance: Normal appearance.  HENT:  Head: Normocephalic.     Mouth/Throat:     Mouth: Mucous membranes are moist.     Pharynx: Oropharynx is clear.  Eyes:     Extraocular Movements: Extraocular movements intact.     Conjunctiva/sclera: Conjunctivae normal.     Pupils: Pupils are equal, round, and reactive to light.  Cardiovascular:     Rate and Rhythm: Normal rate and regular rhythm.     Pulses: Normal pulses.     Heart sounds: Normal heart sounds.  Pulmonary:     Effort: Pulmonary effort is normal.     Breath sounds: Normal breath sounds.  Musculoskeletal:     Cervical back: No tenderness.  Lymphadenopathy:     Cervical: No cervical adenopathy.  Skin:    General:  Skin is warm and dry.     Capillary Refill: Capillary refill takes less than 2 seconds.  Neurological:     General: No focal deficit present.     Mental Status: She is alert and oriented to person, place, and time.  Psychiatric:        Mood and Affect: Mood normal.        Behavior: Behavior normal.    Results for orders placed or performed in visit on 04/18/24 (from the past 24 hours)  POCT HgB A1C     Status: Abnormal   Collection Time: 04/18/24  9:56 AM  Result Value Ref Range   Hemoglobin A1C 6.6 (A) 4.0 - 5.6 %   HbA1c POC (<> result, manual entry)     HbA1c, POC (prediabetic range)     HbA1c, POC (controlled diabetic range)       ASSESSMENT & PLAN: A total of 43 minutes was spent with the patient and counseling/coordination of care regarding preparing for this visit, review of most recent office visit notes, review of most recent cardiologist office visit notes, review of multiple chronic medical conditions and their management, review of all medications, review of most recent bloodwork results including interpretation of today's hemoglobin A1c, review of health maintenance items, education on nutrition, prognosis, documentation, and need for follow up.   Problem List Items Addressed This Visit       Cardiovascular and Mediastinum   Coronary artery disease involving native coronary artery of native heart with angina pectoris (HCC)   Stable.  No recent use of nitroglycerin  Continues metoprolol  succinate 50 mg daily. Continues Plavix  75 mg daily      Chronic heart failure with preserved ejection fraction (HFpEF) (HCC)   No clinical findings of acute congestive heart failure Continues furosemide  20 mg daily along with metoprolol  succinate 50 mg daily and lisinopril  40 mg daily      Hypertension associated with diabetes (HCC) - Primary   Well-controlled hypertension Continue metoprolol  succinate 50 mg daily and lisinopril  40 mg daily Well-controlled diabetes with  hemoglobin A1c of 6.6 Not taking Trulicity  0.75 mg due to cost Cardiovascular risks associated with hypertension and diabetes discussed Diet and nutrition discussed        Respiratory   OSA (obstructive sleep apnea) (Chronic)   Stable.  On CPAP treatment.        Endocrine   Hyperlipidemia associated with type 2 diabetes mellitus (HCC)   Chronic stable condition Continue atorvastatin  80 mg daily        Other   Morbid obesity (HCC)   Continues to lose weight Eating better Diet and nutrition discussed      History of gout   No recent flareup Continues daily allopurinol   100 mg      Other Visit Diagnoses       Type 2 diabetes mellitus with diabetic polyneuropathy, without long-term current use of insulin  (HCC)       Relevant Orders   POCT HgB A1C (Completed)      Patient Instructions  Health Maintenance After Age 4 After age 44, you are at a higher risk for certain long-term diseases and infections as well as injuries from falls. Falls are a major cause of broken bones and head injuries in people who are older than age 32. Getting regular preventive care can help to keep you healthy and well. Preventive care includes getting regular testing and making lifestyle changes as recommended by your health care provider. Talk with your health care provider about: Which screenings and tests you should have. A screening is a test that checks for a disease when you have no symptoms. A diet and exercise plan that is right for you. What should I know about screenings and tests to prevent falls? Screening and testing are the best ways to find a health problem early. Early diagnosis and treatment give you the best chance of managing medical conditions that are common after age 32. Certain conditions and lifestyle choices may make you more likely to have a fall. Your health care provider may recommend: Regular vision checks. Poor vision and conditions such as cataracts can make you more  likely to have a fall. If you wear glasses, make sure to get your prescription updated if your vision changes. Medicine review. Work with your health care provider to regularly review all of the medicines you are taking, including over-the-counter medicines. Ask your health care provider about any side effects that may make you more likely to have a fall. Tell your health care provider if any medicines that you take make you feel dizzy or sleepy. Strength and balance checks. Your health care provider may recommend certain tests to check your strength and balance while standing, walking, or changing positions. Foot health exam. Foot pain and numbness, as well as not wearing proper footwear, can make you more likely to have a fall. Screenings, including: Osteoporosis screening. Osteoporosis is a condition that causes the bones to get weaker and break more easily. Blood pressure screening. Blood pressure changes and medicines to control blood pressure can make you feel dizzy. Depression screening. You may be more likely to have a fall if you have a fear of falling, feel depressed, or feel unable to do activities that you used to do. Alcohol use screening. Using too much alcohol can affect your balance and may make you more likely to have a fall. Follow these instructions at home: Lifestyle Do not drink alcohol if: Your health care provider tells you not to drink. If you drink alcohol: Limit how much you have to: 0-1 drink a day for women. 0-2 drinks a day for men. Know how much alcohol is in your drink. In the [place] one drink equals one 12 oz bottle of beer (355 mL), one 5 oz glass of wine (148 mL), or one 1 oz glass of hard liquor (44 mL). Do not use any products that contain nicotine or tobacco. These products include cigarettes, chewing tobacco, and vaping devices, such as e-cigarettes. If you need help quitting, ask your health care provider. Activity  Follow a regular exercise program to stay  fit. This will help you maintain your balance. Ask your health care provider what types of exercise are appropriate for you.  If you need a cane or walker, use it as recommended by your health care provider. Wear supportive shoes that have nonskid soles. Safety  Remove any tripping hazards, such as rugs, cords, and clutter. Install safety equipment such as grab bars in bathrooms and safety rails on stairs. Keep rooms and walkways well-lit. General instructions Talk with your health care provider about your risks for falling. Tell your health care provider if: You fall. Be sure to tell your health care provider about all falls, even ones that seem minor. You feel dizzy, tiredness (fatigue), or off-balance. Take over-the-counter and prescription medicines only as told by your health care provider. These include supplements. Eat a healthy diet and maintain a healthy weight. A healthy diet includes low-fat dairy products, low-fat (lean) meats, and fiber from whole grains, beans, and lots of fruits and vegetables. Stay current with your vaccines. Schedule regular health, dental, and eye exams. Summary Having a healthy lifestyle and getting preventive care can help to protect your health and wellness after age 45. Screening and testing are the best way to find a health problem early and help you avoid having a fall. Early diagnosis and treatment give you the best chance for managing medical conditions that are more common for people who are older than age 70. Falls are a major cause of broken bones and head injuries in people who are older than age 20. Take precautions to prevent a fall at home. Work with your health care provider to learn what changes you can make to improve your health and wellness and to prevent falls. This information is not intended to replace advice given to you by your health care provider. Make sure you discuss any questions you have with your health care provider. Document  Revised: 02/16/2021 Document Reviewed: 02/16/2021 Elsevier Patient Education  2024 Elsevier Inc.    Emil Schaumann, MD [facility]

## 2024-04-18 NOTE — Assessment & Plan Note (Signed)
 Well-controlled hypertension Continue metoprolol  succinate 50 mg daily and lisinopril  40 mg daily Well-controlled diabetes with hemoglobin A1c of 6.6 Not taking Trulicity  0.75 mg due to cost Cardiovascular risks associated with hypertension and diabetes discussed Diet and nutrition discussed

## 2024-04-18 NOTE — Assessment & Plan Note (Signed)
Chronic stable condition Continue atorvastatin 80 mg daily. 

## 2024-04-18 NOTE — Assessment & Plan Note (Signed)
 No recent flareup Continues daily allopurinol 100 mg

## 2024-04-18 NOTE — Patient Instructions (Signed)
 Health Maintenance After Age 70 After age 4, you are at a higher risk for certain long-term diseases and infections as well as injuries from falls. Falls are a major cause of broken bones and head injuries in people who are older than age 47. Getting regular preventive care can help to keep you healthy and well. Preventive care includes getting regular testing and making lifestyle changes as recommended by your health care provider. Talk with your health care provider about: Which screenings and tests you should have. A screening is a test that checks for a disease when you have no symptoms. A diet and exercise plan that is right for you. What should I know about screenings and tests to prevent falls? Screening and testing are the best ways to find a health problem early. Early diagnosis and treatment give you the best chance of managing medical conditions that are common after age 37. Certain conditions and lifestyle choices may make you more likely to have a fall. Your health care provider may recommend: Regular vision checks. Poor vision and conditions such as cataracts can make you more likely to have a fall. If you wear glasses, make sure to get your prescription updated if your vision changes. Medicine review. Work with your health care provider to regularly review all of the medicines you are taking, including over-the-counter medicines. Ask your health care provider about any side effects that may make you more likely to have a fall. Tell your health care provider if any medicines that you take make you feel dizzy or sleepy. Strength and balance checks. Your health care provider may recommend certain tests to check your strength and balance while standing, walking, or changing positions. Foot health exam. Foot pain and numbness, as well as not wearing proper footwear, can make you more likely to have a fall. Screenings, including: Osteoporosis screening. Osteoporosis is a condition that causes  the bones to get weaker and break more easily. Blood pressure screening. Blood pressure changes and medicines to control blood pressure can make you feel dizzy. Depression screening. You may be more likely to have a fall if you have a fear of falling, feel depressed, or feel unable to do activities that you used to do. Alcohol use screening. Using too much alcohol can affect your balance and may make you more likely to have a fall. Follow these instructions at home: Lifestyle Do not drink alcohol if: Your health care provider tells you not to drink. If you drink alcohol: Limit how much you have to: 0-1 drink a day for women. 0-2 drinks a day for men. Know how much alcohol is in your drink. In the [place] one drink equals one 12 oz bottle of beer (355 mL), one 5 oz glass of wine (148 mL), or one 1 oz glass of hard liquor (44 mL). Do not use any products that contain nicotine or tobacco. These products include cigarettes, chewing tobacco, and vaping devices, such as e-cigarettes. If you need help quitting, ask your health care provider. Activity  Follow a regular exercise program to stay fit. This will help you maintain your balance. Ask your health care provider what types of exercise are appropriate for you. If you need a cane or walker, use it as recommended by your health care provider. Wear supportive shoes that have nonskid soles. Safety  Remove any tripping hazards, such as rugs, cords, and clutter. Install safety equipment such as grab bars in bathrooms and safety rails on stairs. Keep rooms and walkways  well-lit. General instructions Talk with your health care provider about your risks for falling. Tell your health care provider if: You fall. Be sure to tell your health care provider about all falls, even ones that seem minor. You feel dizzy, tiredness (fatigue), or off-balance. Take over-the-counter and prescription medicines only as told by your health care provider. These include  supplements. Eat a healthy diet and maintain a healthy weight. A healthy diet includes low-fat dairy products, low-fat (lean) meats, and fiber from whole grains, beans, and lots of fruits and vegetables. Stay current with your vaccines. Schedule regular health, dental, and eye exams. Summary Having a healthy lifestyle and getting preventive care can help to protect your health and wellness after age 11. Screening and testing are the best way to find a health problem early and help you avoid having a fall. Early diagnosis and treatment give you the best chance for managing medical conditions that are more common for people who are older than age 28. Falls are a major cause of broken bones and head injuries in people who are older than age 48. Take precautions to prevent a fall at home. Work with your health care provider to learn what changes you can make to improve your health and wellness and to prevent falls. This information is not intended to replace advice given to you by your health care provider. Make sure you discuss any questions you have with your health care provider. Document Revised: 02/16/2021 Document Reviewed: 02/16/2021 Elsevier Patient Education  2024 [employer].

## 2024-04-18 NOTE — Assessment & Plan Note (Signed)
Stable.  No recent use of nitroglycerin Continues metoprolol succinate 50 mg daily. Continues Plavix 75 mg daily

## 2024-04-18 NOTE — Assessment & Plan Note (Signed)
No clinical findings of acute congestive heart failure Continues furosemide 20 mg daily along with metoprolol succinate 50 mg daily and lisinopril 40 mg daily

## 2024-04-18 NOTE — Assessment & Plan Note (Signed)
Stable.  On CPAP treatment. 

## 2024-04-18 NOTE — Assessment & Plan Note (Signed)
 Continues to lose weight Eating better Diet and nutrition discussed

## 2024-04-25 NOTE — Progress Notes (Signed)
 Remote pacemaker transmission.

## 2024-04-25 NOTE — Addendum Note (Signed)
 Addended by: VICCI SELLER A on: 04/25/2024 04:10 PM   Modules accepted: Orders

## 2024-05-02 ENCOUNTER — Other Ambulatory Visit: Payer: Self-pay | Admitting: Emergency Medicine

## 2024-05-02 DIAGNOSIS — B370 Candidal stomatitis: Secondary | ICD-10-CM

## 2024-05-02 DIAGNOSIS — K130 Diseases of lips: Secondary | ICD-10-CM

## 2024-05-04 ENCOUNTER — Other Ambulatory Visit
Admission: RE | Admit: 2024-05-04 | Discharge: 2024-05-04 | Disposition: A | Discharge status: Home / Self Care | Attending: Student | Admitting: Student

## 2024-05-04 DIAGNOSIS — I110 Hypertensive heart disease with heart failure: Secondary | ICD-10-CM | POA: Insufficient documentation

## 2024-05-04 DIAGNOSIS — I2510 Atherosclerotic heart disease of native coronary artery without angina pectoris: Secondary | ICD-10-CM | POA: Insufficient documentation

## 2024-05-04 DIAGNOSIS — I495 Sick sinus syndrome: Secondary | ICD-10-CM | POA: Diagnosis not present

## 2024-05-04 DIAGNOSIS — I5032 Chronic diastolic (congestive) heart failure: Secondary | ICD-10-CM | POA: Diagnosis not present

## 2024-05-04 DIAGNOSIS — Z950 Presence of cardiac pacemaker: Secondary | ICD-10-CM | POA: Diagnosis not present

## 2024-05-04 LAB — HEPATIC FUNCTION PANEL
ALT: 18 U/L (ref 0–44)
AST: 27 U/L (ref 15–41)
Albumin: 4.1 g/dL (ref 3.5–5.0)
Alkaline Phosphatase: 69 U/L (ref 38–126)
Bilirubin, Direct: <0.1 mg/dL (ref 0.0–0.2)
Total Bilirubin: 0.7 mg/dL (ref 0.0–1.2)
Total Protein: 7.2 g/dL (ref 6.5–8.1)

## 2024-05-04 LAB — LIPID PANEL
Cholesterol: 137 mg/dL (ref 0–200)
HDL: 41 mg/dL (ref >40)
LDL Cholesterol: 58 mg/dL (ref 0–99)
Total CHOL/HDL Ratio: 3.3 ratio
Triglycerides: 188 mg/dL — ABNORMAL HIGH (ref <150)
VLDL: 38 mg/dL (ref 0–40)

## 2024-05-05 ENCOUNTER — Ambulatory Visit: Payer: Self-pay | Admitting: Student

## 2024-05-08 NOTE — Progress Notes (Signed)
 Last read by Dorothyann Sherre Ramp at 1:34PM on 05/07/2024.

## 2024-05-17 ENCOUNTER — Other Ambulatory Visit: Payer: Self-pay | Admitting: Emergency Medicine

## 2024-05-17 DIAGNOSIS — E790 Hyperuricemia without signs of inflammatory arthritis and tophaceous disease: Secondary | ICD-10-CM

## 2024-05-21 ENCOUNTER — Encounter: Payer: Self-pay | Admitting: Dermatology

## 2024-05-21 ENCOUNTER — Ambulatory Visit: Admitting: Dermatology

## 2024-05-21 VITALS — BP 120/76 | HR 59

## 2024-05-21 DIAGNOSIS — S00522A Blister (nonthermal) of oral cavity, initial encounter: Secondary | ICD-10-CM

## 2024-05-21 DIAGNOSIS — B370 Candidal stomatitis: Secondary | ICD-10-CM

## 2024-05-21 DIAGNOSIS — L438 Other lichen planus: Secondary | ICD-10-CM | POA: Diagnosis not present

## 2024-05-21 DIAGNOSIS — K130 Diseases of lips: Secondary | ICD-10-CM

## 2024-05-21 DIAGNOSIS — L439 Lichen planus, unspecified: Secondary | ICD-10-CM

## 2024-05-21 DIAGNOSIS — Z139 Encounter for screening, unspecified: Secondary | ICD-10-CM

## 2024-05-21 MED ORDER — TRIAMCINOLONE ACETONIDE 0.1 % MT PSTE: 1.0000 | PASTE | Freq: Every day | OROMUCOSAL | 12 refills | Status: AC

## 2024-05-21 MED ORDER — HYDROXYCHLOROQUINE SULFATE 200 MG PO TABS: 200.0000 mg | ORAL_TABLET | Freq: Every day | ORAL | 11 refills | Status: AC

## 2024-05-21 MED ORDER — NYSTATIN 100000 UNIT/ML MT SUSP: 5.0000 mL | Freq: Four times a day (QID) | OROMUCOSAL | 6 refills | Status: AC

## 2024-05-21 NOTE — Patient Instructions (Addendum)
 Date: Mon May 21 2024  Hello Nathanel,  Thank you for visiting today. Here is a summary of the key instructions:  - Medications:   - Start taking Plaquenil  as prescribed   - Continue using triamcinolone  paste twice a day until symptoms calm down   - Continue using nystatin  mouthwash (swish and swallow)  - Tests:   - Get a viral culture swab   - Complete blood work for HSV 1 and 2, IgM and IgG  - Referrals:   - Schedule a baseline eye exam  - Follow-up:   - Return for follow-up appointment in 8 weeks  - Other Instructions:   - Get prescriptions filled for Plaquenil , triamcinolone  paste, and nystatin  mouthwash   - If HSV-related, an antiviral medicine may be added to your treatment plan  Please reach out if you have any questions or concerns.  Warm regards,  Dr. [facility]     Important Information   Due to recent changes in healthcare laws, you may see results of your pathology and/or laboratory studies on MyChart before the doctors have had a chance to review them. We understand that in some cases there may be results that are confusing or concerning to you. Please understand that not all results are received at the same time and often the doctors may need to interpret multiple results in order to provide you with the best plan of care or course of treatment. Therefore, we ask that you please give us  2 business days to thoroughly review all your results before contacting the office for clarification. Should we see a critical lab result, you will be contacted sooner.     If You Need Anything After Your Visit   If you have any questions or concerns for your doctor, please call our main line at [phone]. If no one answers, please leave a voicemail as directed and we will return your call as soon as possible. Messages left after 4 pm will be answered the following business day.    You may also send us  a message via MyChart. We typically respond to MyChart  messages within 1-2 business days.  For prescription refills, please ask your pharmacy to contact our office. Our fax number is [phone].  If you have an urgent issue when the clinic is closed that cannot wait until the next business day, you can page your doctor at the number below.     Please note that while we do our best to be available for urgent issues outside of office hours, we are not available 24/7.    If you have an urgent issue and are unable to reach us , you may choose to seek medical care at your doctor's office, retail clinic, urgent care center, or emergency room.   If you have a medical emergency, please immediately call 911 or go to the emergency department. In the event of inclement weather, please call our main line at [phone] for an update on the status of any delays or closures.  Dermatology Medication Tips: Please keep the boxes that topical medications come in in order to help keep track of the instructions about where and how to use these. Pharmacies typically print the medication instructions only on the boxes and not directly on the medication tubes.   If your medication is too expensive, please contact our office at [phone] or send us  a message through MyChart.    We are unable to tell what your co-pay for medications will be in  advance as this is different depending on your insurance coverage. However, we may be able to find a substitute medication at lower cost or fill out paperwork to get insurance to cover a needed medication.    If a prior authorization is required to get your medication covered by your insurance company, please allow us  1-2 business days to complete this process.   Drug prices often vary depending on where the prescription is filled and some pharmacies may offer cheaper prices.   The website www.goodrx.com contains coupons for medications through different pharmacies. The prices here do not account for what the cost may be with  help from insurance (it may be cheaper with your insurance), but the website can give you the price if you did not use any insurance.  - You can print the associated coupon and take it with your prescription to the pharmacy.  - You may also stop by our office during regular business hours and pick up a GoodRx coupon card.  - If you need your prescription sent electronically to a different pharmacy, notify our office through [facility] or by phone at [phone]

## 2024-05-21 NOTE — Progress Notes (Signed)
   New Patient Visit   Subjective  Adelheid Hoggard is a 68 y.o. female who presents for the following: Skin Lesion on lip  Patient states she has ulcerations located at the bottom lip that she would like to have examined. Patient reports the areas have been there for 10 years. She reports the areas are bothersome. Patient rates irritation 8 out of 10. Patient reports she has previously been treated for these areas (She was given hydrocortisone, oral Prednisone , Nystatin  and Fluconazole  tablets none of the medications helped). Patient reports Hx of bx (Bx proven Lichen Planus). Patient denies family history of skin cancer(s).  The patient has spots, moles and lesions to be evaluated, some may be new or changing and the patient may have concern these could be cancer.  The following portions of the chart were reviewed this encounter and updated as appropriate: medications, allergies, medical history  Review of Systems:  No other skin or systemic complaints except as noted in HPI or Assessment and Plan.  Objective  Well appearing patient in no apparent distress; mood and affect are within normal limits.  A focused examination was performed of the following areas: Bottom Lip  Relevant exam findings are noted in the Assessment and Plan.      Assessment & Plan   1. Lichen Planus (Oral) - Assessment: Patient has long-standing history of lichen planus diagnosed via biopsy in the 1990s. Experiences recurring episodes of superficial ulcerations in mouth occurring approximately once a month, sometimes weekly, with only 4-5 symptom-free days per month. Previous treatments including topical hydrocortisone ointment, triamcinolone  paste, and oral prednisone  provided temporary relief but symptoms recur upon discontinuation. Current examination reveals two small aphthous-like ulcers in mucosa and wet-dry border of lower lip. Patient reports improvement in cheek involvement after removal of teeth with metal  fillings. Given persistent nature of symptoms and partial response to previous treatments, further evaluation and management warranted.  - Plan:    Perform viral culture swab and blood work for HSV 1 and 2 (IgM and IgG)    Initiate Plaquenil  (hydroxychloroquine )    Continue triamcinolone  paste twice daily until symptoms calm    Continue nystatin  mouthwash (swish and swallow)    Patient informed of need for baseline eye exam due to potential corneal deposits    If HSV-related, add antiviral medicine while continuing hydroxychloroquine   Follow-up in 8 weeks to assess progress on Plaquenil  and triamcinolone .  INFECTION OF LIP   Related Medications fluconazole  (DIFLUCAN ) 150 MG tablet Take 1 tablet by mouth once daily for 7 days nystatin  (MYCOSTATIN ) 100000 UNIT/ML suspension TAKE 5 ML BY MOUTH  4 TIMES DAILY FOR 7 DAYS ORAL THRUSH   Related Medications fluconazole  (DIFLUCAN ) 150 MG tablet Take 1 tablet by mouth once daily for 7 days nystatin  (MYCOSTATIN ) 100000 UNIT/ML suspension TAKE 5 ML BY MOUTH  4 TIMES DAILY FOR 7 DAYS  Return in about 4 weeks (around 06/18/2024).  I, Jetta Ager, am acting as Neurosurgeon for [employer], DO.  Documentation: I have reviewed the above documentation for accuracy and completeness, and I agree with the above.  Delon Lenis, DO

## 2024-05-24 DIAGNOSIS — Z139 Encounter for screening, unspecified: Secondary | ICD-10-CM | POA: Diagnosis not present

## 2024-05-25 LAB — HSV 1 AND 2 AB, IGG
HSV 1 Glycoprotein G Ab, IgG: REACTIVE — AB
HSV 2 IgG, Type Spec: NONREACTIVE

## 2024-05-29 LAB — VIRUS CULTURE

## 2024-06-07 ENCOUNTER — Ambulatory Visit: Payer: Self-pay | Admitting: Dermatology

## 2024-06-07 NOTE — Progress Notes (Signed)
 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 This means that some of the flares in the past could have been from HSV but the active area we saw and testing during her visit was not caused by HSV.  She should continue the recommended treatment regimen and we will talk in more detail at her follow up

## 2024-06-08 ENCOUNTER — Ambulatory Visit (INDEPENDENT_AMBULATORY_CARE_PROVIDER_SITE_OTHER): Payer: Medicare Other

## 2024-06-08 DIAGNOSIS — I495 Sick sinus syndrome: Secondary | ICD-10-CM

## 2024-06-09 LAB — CUP PACEART REMOTE DEVICE CHECK
Battery Impedance: 3759 Ohm
Battery Remaining Longevity: 22 mo
Battery Voltage: 2.69 V
Brady Statistic AP VP Percent: 0 %
Brady Statistic AP VS Percent: 0 %
Brady Statistic AS VP Percent: 1 %
Brady Statistic AS VS Percent: 99 %
Date Time Interrogation Session: 20250828201406
Implantable Lead Connection Status: 753985
Implantable Lead Connection Status: 753985
Implantable Lead Implant Date: 20091201
Implantable Lead Implant Date: 20091201
Implantable Lead Location: 753859
Implantable Lead Location: 753860
Implantable Lead Model: 5076
Implantable Lead Model: 5076
Implantable Pulse Generator Implant Date: 20091201
Lead Channel Impedance Value: 394 Ohm
Lead Channel Impedance Value: 582 Ohm
Lead Channel Pacing Threshold Amplitude: 0.375 V
Lead Channel Pacing Threshold Amplitude: 1.375 V
Lead Channel Pacing Threshold Pulse Width: 0.4 ms
Lead Channel Pacing Threshold Pulse Width: 0.4 ms
Lead Channel Setting Pacing Amplitude: 2 V
Lead Channel Setting Pacing Amplitude: 2.75 V
Lead Channel Setting Pacing Pulse Width: 0.4 ms
Lead Channel Setting Sensing Sensitivity: 2.8 mV
Zone Setting Status: 755011
Zone Setting Status: 755011

## 2024-06-16 ENCOUNTER — Other Ambulatory Visit: Payer: Self-pay | Admitting: Emergency Medicine

## 2024-06-16 DIAGNOSIS — G629 Polyneuropathy, unspecified: Secondary | ICD-10-CM

## 2024-06-16 DIAGNOSIS — Z9109 Other allergy status, other than to drugs and biological substances: Secondary | ICD-10-CM

## 2024-06-16 DIAGNOSIS — G43109 Migraine with aura, not intractable, without status migrainosus: Secondary | ICD-10-CM

## 2024-06-17 ENCOUNTER — Ambulatory Visit: Payer: Self-pay | Admitting: Cardiology

## 2024-06-18 NOTE — Progress Notes (Signed)
 Remote PPM Transmission

## 2024-07-19 ENCOUNTER — Other Ambulatory Visit: Payer: Self-pay | Admitting: Emergency Medicine

## 2024-07-23 ENCOUNTER — Ambulatory Visit: Admitting: Dermatology

## 2024-08-07 ENCOUNTER — Other Ambulatory Visit: Payer: Self-pay | Admitting: Internal Medicine

## 2024-08-08 ENCOUNTER — Other Ambulatory Visit: Payer: Self-pay | Admitting: Emergency Medicine

## 2024-08-08 MED ORDER — FUROSEMIDE 20 MG PO TABS: 40.0000 mg | ORAL_TABLET | Freq: Every day | ORAL | 3 refills | Status: AC

## 2024-08-08 MED ORDER — FUROSEMIDE 20 MG PO TABS: 40.0000 mg | ORAL_TABLET | Freq: Every day | ORAL | 2 refills | Status: DC

## 2024-08-08 NOTE — Telephone Encounter (Signed)
 furosemide  (LASIX ) 20 MG tablet 180 tablet 3 08/08/2024 --  Sig - Route: Take 2 tablets (40 mg total) by mouth daily. - Oral  Sent to pharmacy as: furosemide  (LASIX ) 20 MG tablet  Notes to Pharmacy: Please send a replace/new response with 100-Day Supply if appropriate to maximize member benefit. Requesting 1 year supply.  E-Prescribing Status: Receipt confirmed by pharmacy (08/08/2024 11:51 AM EDT)

## 2024-08-31 ENCOUNTER — Other Ambulatory Visit: Payer: Self-pay | Admitting: Emergency Medicine

## 2024-08-31 DIAGNOSIS — Z9109 Other allergy status, other than to drugs and biological substances: Secondary | ICD-10-CM

## 2024-08-31 DIAGNOSIS — Z1231 Encounter for screening mammogram for malignant neoplasm of breast: Secondary | ICD-10-CM

## 2024-08-31 DIAGNOSIS — I2510 Atherosclerotic heart disease of native coronary artery without angina pectoris: Secondary | ICD-10-CM

## 2024-09-10 ENCOUNTER — Ambulatory Visit

## 2024-09-10 DIAGNOSIS — I495 Sick sinus syndrome: Secondary | ICD-10-CM

## 2024-09-11 LAB — CUP PACEART REMOTE DEVICE CHECK
Battery Impedance: 4795 Ohm
Battery Remaining Longevity: 13 mo
Battery Voltage: 2.66 V
Brady Statistic AP VP Percent: 0 %
Brady Statistic AP VS Percent: 0 %
Brady Statistic AS VP Percent: 1 %
Brady Statistic AS VS Percent: 99 %
Date Time Interrogation Session: 20251130183950
Implantable Lead Connection Status: 753985
Implantable Lead Connection Status: 753985
Implantable Lead Implant Date: 20091201
Implantable Lead Implant Date: 20091201
Implantable Lead Location: 753859
Implantable Lead Location: 753860
Implantable Lead Model: 5076
Implantable Lead Model: 5076
Implantable Pulse Generator Implant Date: 20091201
Lead Channel Impedance Value: 429 Ohm
Lead Channel Impedance Value: 598 Ohm
Lead Channel Pacing Threshold Amplitude: 0.375 V
Lead Channel Pacing Threshold Amplitude: 1.375 V
Lead Channel Pacing Threshold Pulse Width: 0.4 ms
Lead Channel Pacing Threshold Pulse Width: 0.4 ms
Lead Channel Setting Pacing Amplitude: 2 V
Lead Channel Setting Pacing Amplitude: 2.75 V
Lead Channel Setting Pacing Pulse Width: 0.46 ms
Lead Channel Setting Sensing Sensitivity: 2.8 mV
Zone Setting Status: 755011
Zone Setting Status: 755011

## 2024-09-14 NOTE — Progress Notes (Signed)
 Remote PPM Transmission

## 2024-09-15 ENCOUNTER — Other Ambulatory Visit: Payer: Self-pay | Admitting: Emergency Medicine

## 2024-09-15 DIAGNOSIS — F419 Anxiety disorder, unspecified: Secondary | ICD-10-CM

## 2024-09-28 ENCOUNTER — Ambulatory Visit: Payer: Self-pay | Admitting: Cardiology

## 2024-10-01 ENCOUNTER — Encounter (HOSPITAL_BASED_OUTPATIENT_CLINIC_OR_DEPARTMENT_OTHER): Payer: Self-pay

## 2024-10-01 ENCOUNTER — Ambulatory Visit (HOSPITAL_BASED_OUTPATIENT_CLINIC_OR_DEPARTMENT_OTHER)

## 2024-10-01 ENCOUNTER — Ambulatory Visit (HOSPITAL_BASED_OUTPATIENT_CLINIC_OR_DEPARTMENT_OTHER)
Admission: RE | Admit: 2024-10-01 | Discharge: 2024-10-01 | Disposition: A | Discharge status: Home / Self Care | Source: Ambulatory Visit | Attending: Emergency Medicine | Admitting: Emergency Medicine

## 2024-10-01 DIAGNOSIS — Z1231 Encounter for screening mammogram for malignant neoplasm of breast: Secondary | ICD-10-CM | POA: Diagnosis present

## 2024-10-08 ENCOUNTER — Ambulatory Visit: Payer: Self-pay | Admitting: Emergency Medicine

## 2024-10-22 ENCOUNTER — Encounter: Payer: Self-pay | Admitting: Emergency Medicine

## 2024-10-22 ENCOUNTER — Ambulatory Visit: Admitting: Emergency Medicine

## 2024-10-22 ENCOUNTER — Ambulatory Visit: Payer: Self-pay | Admitting: Emergency Medicine

## 2024-10-22 VITALS — BP 138/82 | HR 76 | Temp 98.0°F | Ht 62.0 in | Wt 202.1 lb

## 2024-10-22 DIAGNOSIS — E1169 Type 2 diabetes mellitus with other specified complication: Secondary | ICD-10-CM

## 2024-10-22 DIAGNOSIS — K219 Gastro-esophageal reflux disease without esophagitis: Secondary | ICD-10-CM | POA: Diagnosis not present

## 2024-10-22 DIAGNOSIS — E1159 Type 2 diabetes mellitus with other circulatory complications: Secondary | ICD-10-CM

## 2024-10-22 DIAGNOSIS — I25119 Atherosclerotic heart disease of native coronary artery with unspecified angina pectoris: Secondary | ICD-10-CM | POA: Diagnosis not present

## 2024-10-22 DIAGNOSIS — G4733 Obstructive sleep apnea (adult) (pediatric): Secondary | ICD-10-CM | POA: Diagnosis not present

## 2024-10-22 DIAGNOSIS — I152 Hypertension secondary to endocrine disorders: Secondary | ICD-10-CM | POA: Diagnosis not present

## 2024-10-22 DIAGNOSIS — Z8739 Personal history of other diseases of the musculoskeletal system and connective tissue: Secondary | ICD-10-CM | POA: Diagnosis not present

## 2024-10-22 DIAGNOSIS — Z6836 Body mass index (BMI) 36.0-36.9, adult: Secondary | ICD-10-CM | POA: Diagnosis not present

## 2024-10-22 DIAGNOSIS — I5032 Chronic diastolic (congestive) heart failure: Secondary | ICD-10-CM | POA: Diagnosis not present

## 2024-10-22 DIAGNOSIS — E785 Hyperlipidemia, unspecified: Secondary | ICD-10-CM

## 2024-10-22 LAB — COMPREHENSIVE METABOLIC PANEL WITH GFR
ALT: 12 U/L (ref 3–35)
AST: 19 U/L (ref 5–37)
Albumin: 4.8 g/dL (ref 3.5–5.2)
Alkaline Phosphatase: 84 U/L (ref 39–117)
BUN: 22 mg/dL (ref 6–23)
CO2: 28 meq/L (ref 19–32)
Calcium: 10.5 mg/dL (ref 8.4–10.5)
Chloride: 99 meq/L (ref 96–112)
Creatinine, Ser: 1.13 mg/dL (ref 0.40–1.20)
GFR: 49.28 mL/min — ABNORMAL LOW
Glucose, Bld: 131 mg/dL — ABNORMAL HIGH (ref 70–99)
Potassium: 4.8 meq/L (ref 3.5–5.1)
Sodium: 136 meq/L (ref 135–145)
Total Bilirubin: 0.5 mg/dL (ref 0.2–1.2)
Total Protein: 8.1 g/dL (ref 6.0–8.3)

## 2024-10-22 LAB — CBC WITH DIFFERENTIAL/PLATELET
Basophils Absolute: 0 K/uL (ref 0.0–0.1)
Basophils Relative: 0.7 % (ref 0.0–3.0)
Eosinophils Absolute: 0.2 K/uL (ref 0.0–0.7)
Eosinophils Relative: 4.1 % (ref 0.0–5.0)
HCT: 36.4 % (ref 36.0–46.0)
Hemoglobin: 12.9 g/dL (ref 12.0–15.0)
Lymphocytes Relative: 19.9 % (ref 12.0–46.0)
Lymphs Abs: 1.2 K/uL (ref 0.7–4.0)
MCHC: 35.3 g/dL (ref 30.0–36.0)
MCV: 92.7 fl (ref 78.0–100.0)
Monocytes Absolute: 0.6 K/uL (ref 0.1–1.0)
Monocytes Relative: 9.5 % (ref 3.0–12.0)
Neutro Abs: 3.9 K/uL (ref 1.4–7.7)
Neutrophils Relative %: 65.8 % (ref 43.0–77.0)
Platelets: 226 K/uL (ref 150.0–400.0)
RBC: 3.93 Mil/uL (ref 3.87–5.11)
RDW: 14.1 % (ref 11.5–15.5)
WBC: 5.9 K/uL (ref 4.0–10.5)

## 2024-10-22 LAB — MICROALBUMIN / CREATININE URINE RATIO
Creatinine,U: 57.2 mg/dL
Microalb Creat Ratio: 78.6 mg/g — ABNORMAL HIGH (ref 0.0–30.0)
Microalb, Ur: 4.5 mg/dL — ABNORMAL HIGH (ref 0.7–1.9)

## 2024-10-22 LAB — LIPID PANEL
Cholesterol: 234 mg/dL — ABNORMAL HIGH (ref 28–200)
HDL: 44 mg/dL
LDL Cholesterol: 118 mg/dL — ABNORMAL HIGH (ref 10–99)
NonHDL: 190.31
Total CHOL/HDL Ratio: 5
Triglycerides: 363 mg/dL — ABNORMAL HIGH (ref 10.0–149.0)
VLDL: 72.6 mg/dL — ABNORMAL HIGH (ref 0.0–40.0)

## 2024-10-22 LAB — POCT GLYCOSYLATED HEMOGLOBIN (HGB A1C): HbA1c POC (<> result, manual entry): 6.9 %

## 2024-10-22 MED ORDER — ROSUVASTATIN CALCIUM 10 MG PO TABS: 10.0000 mg | ORAL_TABLET | Freq: Every day | ORAL | 3 refills | Status: AC

## 2024-10-22 MED ORDER — DAPAGLIFLOZIN PROPANEDIOL 10 MG PO TABS: 10.0000 mg | ORAL_TABLET | Freq: Every day | ORAL | 3 refills | Status: AC

## 2024-10-22 NOTE — Patient Instructions (Signed)
 Health Maintenance After Age 71 After age 27, you are at a higher risk for certain long-term diseases and infections as well as injuries from falls. Falls are a major cause of broken bones and head injuries in people who are older than age 73. Getting regular preventive care can help to keep you healthy and well. Preventive care includes getting regular testing and making lifestyle changes as recommended by your health care provider. Talk with your health care provider about: Which screenings and tests you should have. A screening is a test that checks for a disease when you have no symptoms. A diet and exercise plan that is right for you. What should I know about screenings and tests to prevent falls? Screening and testing are the best ways to find a health problem early. Early diagnosis and treatment give you the best chance of managing medical conditions that are common after age 90. Certain conditions and lifestyle choices may make you more likely to have a fall. Your health care provider may recommend: Regular vision checks. Poor vision and conditions such as cataracts can make you more likely to have a fall. If you wear glasses, make sure to get your prescription updated if your vision changes. Medicine review. Work with your health care provider to regularly review all of the medicines you are taking, including over-the-counter medicines. Ask your health care provider about any side effects that may make you more likely to have a fall. Tell your health care provider if any medicines that you take make you feel dizzy or sleepy. Strength and balance checks. Your health care provider may recommend certain tests to check your strength and balance while standing, walking, or changing positions. Foot health exam. Foot pain and numbness, as well as not wearing proper footwear, can make you more likely to have a fall. Screenings, including: Osteoporosis screening. Osteoporosis is a condition that causes  the bones to get weaker and break more easily. Blood pressure screening. Blood pressure changes and medicines to control blood pressure can make you feel dizzy. Depression screening. You may be more likely to have a fall if you have a fear of falling, feel depressed, or feel unable to do activities that you used to do. Alcohol  use screening. Using too much alcohol  can affect your balance and may make you more likely to have a fall. Follow these instructions at home: Lifestyle Do not drink alcohol  if: Your health care provider tells you not to drink. If you drink alcohol : Limit how much you have to: 0-1 drink a day for women. 0-2 drinks a day for men. Know how much alcohol  is in your drink. In the [place] one drink equals one 12 oz bottle of beer (355 mL), one 5 oz glass of wine (148 mL), or one 1 oz glass of hard liquor (44 mL). Do not use any products that contain nicotine or tobacco. These products include cigarettes, chewing tobacco, and vaping devices, such as e-cigarettes. If you need help quitting, ask your health care provider. Activity  Follow a regular exercise program to stay fit. This will help you maintain your balance. Ask your health care provider what types of exercise are appropriate for you. If you need a cane or walker, use it as recommended by your health care provider. Wear supportive shoes that have nonskid soles. Safety  Remove any tripping hazards, such as rugs, cords, and clutter. Install safety equipment such as grab bars in bathrooms and safety rails on stairs. Keep rooms and walkways  well-lit. General instructions Talk with your health care provider about your risks for falling. Tell your health care provider if: You fall. Be sure to tell your health care provider about all falls, even ones that seem minor. You feel dizzy, tiredness (fatigue), or off-balance. Take over-the-counter and prescription medicines only as told by your health care provider. These include  supplements. Eat a healthy diet and maintain a healthy weight. A healthy diet includes low-fat dairy products, low-fat (lean) meats, and fiber from whole grains, beans, and lots of fruits and vegetables. Stay current with your vaccines. Schedule regular health, dental, and eye exams. Summary Having a healthy lifestyle and getting preventive care can help to protect your health and wellness after age 15. Screening and testing are the best way to find a health problem early and help you avoid having a fall. Early diagnosis and treatment give you the best chance for managing medical conditions that are more common for people who are older than age 42. Falls are a major cause of broken bones and head injuries in people who are older than age 64. Take precautions to prevent a fall at home. Work with your health care provider to learn what changes you can make to improve your health and wellness and to prevent falls. This information is not intended to replace advice given to you by your health care provider. Make sure you discuss any questions you have with your health care provider. Document Revised: 02/16/2021 Document Reviewed: 02/16/2021 Elsevier Patient Education  2024 [employer].

## 2024-10-22 NOTE — Assessment & Plan Note (Addendum)
 Chronic stable condition Continue atorvastatin  40 mg daily Hemoglobin A1c today is 6.9 Last cardiologist office assessment and plan regarding hyperlipidemia: Hyperlipidemia/statin myopathy LDL 39 January 2025, at goal. Patient is having difficulty affording Praluent  with the new medicare changes. She continues to take atorvastatin  but it causes generalized malaise and myopathy.  -Stop Praluent .  -Add Zetia  10 mg daily.  -Decrease atorvastatin  to 40 mg. -Repeat lipid panel and LFTs in 2 months.

## 2024-10-22 NOTE — Progress Notes (Signed)
 [name] [name] 69 y.o.   Chief Complaint  Patient presents with   Medical Management of Chronic Issues    6 month follow up,     HISTORY OF PRESENT ILLNESS: This is a 69 y.o. female here for 64-month follow-up of multiple chronic medical conditions Overall doing well.  Has no complaints or medical concerns today. Lab Results  Component Value Date   HGBA1C 6.6 (A) 04/18/2024   Wt Readings from Last 3 Encounters:  04/18/24 193 lb (87.5 kg)  01/23/24 186 lb 3.2 oz (84.5 kg)  01/11/24 185 lb (83.9 kg)     HPI   Prior to Admission medications  Medication Sig Start Date End Date Taking? Authorizing Provider  Accu-Chek Softclix Lancets lancets USE AS DIRECTED 05/10/21  Yes Lakoda Mcanany, Emil Schanz, MD  albuterol  (VENTOLIN  HFA) 108 [phone] Base) MCG/ACT inhaler Inhale 1-2 puffs into the lungs every 6 (six) hours as needed for wheezing or shortness of breath. 04/17/19  Yes Purcell Emil Schanz, MD  allopurinol  (ZYLOPRIM ) 100 MG tablet Take 1 tablet by mouth once daily 05/17/24  Yes Jackelynn Hosie, Emil Schanz, MD  Blood Glucose Monitoring Suppl (ACCU-CHEK GUIDE) w/Device KIT 1 Device by Does not apply route in the morning, at noon, and at bedtime. 05/24/22  Yes Jamesina Gaugh, Emil Schanz, MD  cetirizine (ZYRTEC) 10 MG tablet Take 10 mg by mouth daily.   Yes [provider]  citalopram  (CELEXA ) 20 MG tablet TAKE 1 TABLET BY MOUTH DAILY 09/16/24  Yes Lindell Renfrew, Emil Schanz, MD  clopidogrel  (PLAVIX ) 75 MG tablet TAKE 1 TABLET BY MOUTH DAILY 04/12/24  Yes End, Lonni, MD  colchicine  0.6 MG tablet Take 1 tablet (0.6 mg total) by mouth 2 (two) times daily for 5 days. 04/18/23 10/22/24 Yes Omare Bilotta, Emil Schanz, MD  dicyclomine  (BENTYL ) 20 MG tablet Take 20 mg by mouth 3 (three) times daily. 07/11/21  Yes [provider]  ezetimibe  (ZETIA ) 10 MG tablet Take 1 tablet (10 mg total) by mouth daily. 01/23/24 10/22/24 Yes Wittenborn, Barnie, NP  fluconazole  (DIFLUCAN ) 150 MG tablet Take 1 tablet by mouth once  daily for 7 days 05/03/24  Yes Kelissa Merlin, Emil Schanz, MD  fluticasone  (FLONASE ) 50 MCG/ACT nasal spray USE 2 SPRAYS IN BOTH NOSTRILS  DAILY 08/31/24  Yes Gladyse Corvin Jose, MD  fluticasone  (FLOVENT  HFA) 110 MCG/ACT inhaler Inhale 2 puffs into the lungs 2 (two) times daily as needed.   Yes [provider]  furosemide  (LASIX ) 20 MG tablet Take 2 tablets (40 mg total) by mouth daily. 08/08/24  Yes Wittenborn, Barnie, NP  glucose blood test strip Use as instructed 06/01/22  Yes Shantell Belongia Jose, MD  hydrocortisone cream 1 % Apply 1 application topically 2 (two) times daily.   Yes [provider]  hydroxychloroquine  (PLAQUENIL ) 200 MG tablet Take 1 tablet (200 mg total) by mouth daily. 05/21/24  Yes Alm Delon SAILOR, DO  lisinopril  (ZESTRIL ) 40 MG tablet TAKE 1 TABLET BY MOUTH DAILY 04/12/24  Yes Darrel Baroni Jose, MD  metoprolol  succinate (TOPROL -XL) 50 MG 24 hr tablet TAKE 1 TABLET BY MOUTH TWICE  DAILY WITH OR IMMEDIATELY  FOLLOWING A MEAL 07/20/24  Yes Roselynn Whitacre, Emil Schanz, MD  Multiple Vitamin (MULTIVITAMIN) tablet Take 1 tablet by mouth daily. 08/30/16  Yes Dwan Aldo M, PA-C  nystatin  (MYCOSTATIN ) 100000 UNIT/ML suspension Take 5 mLs (500,000 Units total) by mouth 4 (four) times daily. 05/21/24  Yes Alm Delon SAILOR, DO  nystatin  cream (MYCOSTATIN ) Apply 1 application topically as needed for dry skin. 04/28/20  Yes Brentley Horrell, Emil Schanz, MD  omeprazole  (PRILOSEC) 20 MG capsule Take 1 capsule (20 mg total) by mouth daily. 12/15/16  Yes English, Corean D, PA  ondansetron  (ZOFRAN -ODT) 8 MG disintegrating tablet DISSOLVE 1 TABLET ON THE TONGUE  EVERY 12 HOURS AS NEEDED FOR  NAUSEA 06/18/24  Yes Teressa Mcglocklin, Emil Schanz, MD  traMADol  (ULTRAM ) 50 MG tablet Take 1 tablet (50 mg total) by mouth every 12 (twelve) hours as needed. 06/18/24  Yes Ivie Maese, Emil Schanz, MD  triamcinolone  (KENALOG ) 0.1 % paste Use as directed 1 Application in the mouth or throat daily. 05/21/24  Yes  Alm Delon SAILOR, DO  triamcinolone  cream (KENALOG ) 0.1 % APPLY 1 APPLICATION TOPICALLY  DAILY AS NEEDED 12/27/22  Yes Kameren Pargas, Emil Schanz, MD  TRULICITY  0.75 MG/0.5ML SOAJ INJECT 0.75MG  (0.5ML) UNDER THE SKIN ONCE A WEEK. 11/02/23  Yes Jontae Adebayo, Emil Schanz, MD  loratadine  (CLARITIN ) 10 MG tablet Take 10 mg by mouth daily. Patient not taking: Reported on 10/22/2024    [provider]    Allergies[1]  Patient Active Problem List   Diagnosis Date Noted   Infection of lip 10/19/2023   History of gout 04/18/2023   Hyperuricemia 06/10/2022   Chronic left shoulder pain 10/28/2021   Hyperlipidemia associated with type 2 diabetes mellitus (HCC) 12/10/2020   Trigger finger, right middle finger 11/07/2019   Hypertension associated with diabetes (HCC) 10/29/2019   Chronic heart failure with preserved ejection fraction (HFpEF) (HCC) 05/25/2019   Coronary artery disease involving native coronary artery of native heart with angina pectoris 02/14/2018   Morbid obesity (HCC) 02/14/2018   Irritable bowel syndrome 06/28/2017   S/P CABG x 3 07/22/2016   Multiple vessel coronary artery disease 07/21/2016   Environmental allergies 04/26/2016   Sinus node dysfunction (HCC) 04/26/2016   Pacemaker Medtronic    OSA (obstructive sleep apnea) 05/25/2011   Hx of adenomatous colonic polyps 05/25/2011   Migraine 05/25/2011   Diabetes mellitus type 2, uncontrolled, with complications 05/25/2011   GERD (gastroesophageal reflux disease) 05/25/2011   NAFLD (nonalcoholic fatty liver disease) 91/85/7987   Hyperlipidemia LDL goal <70 05/25/2011   Essential hypertension 09/16/2009   DIASTOLIC HEART FAILURE, CHRONIC 09/16/2009    Past Medical History:  Diagnosis Date   Allergy    Anal fissure    Anxiety    Arthritis    Asthma    Cataract 2010    left   Colon polyps    diverticulosis  03-2011/2005   Depression    Diabetes mellitus 2012   T2DM   Diverticulosis    Fatty liver 04/2011   GERD  (gastroesophageal reflux disease)    Hyperlipidemia    Hypertension severe    Migraine    Obesity    OSA (obstructive sleep apnea)    C-Pap   Pacemaker Medtronic    Sinoatrial node dysfunction (HCC)    syncope    Past Surgical History:  Procedure Laterality Date   ABDOMINAL HYSTERECTOMY  2000   total   CARDIAC CATHETERIZATION N/A 07/20/2016   Procedure: Left Heart Cath and Coronary Angiography;  Surgeon: Peter M Jordan, MD;  Location: [facility] INVASIVE CV LAB;  Service: Cardiovascular;  Laterality: N/A;   CATARACT EXTRACTION Left    CORONARY ARTERY BYPASS GRAFT N/A 07/22/2016   Procedure: CORONARY ARTERY BYPASS GRAFTING (CABG) x three , using left internal mammary artery and right leg greater saphenous vein harvested endoscopically;  Surgeon: Maude Fleeta Ochoa, MD;  Location: [facility] OR;  Service: Open Heart Surgery;  Laterality: N/A;  EYE SURGERY Right 2010   MULTIPLE TOOTH EXTRACTIONS     OOPHORECTOMY     PACEMAKER PLACEMENT     T/A right knee     TEE WITHOUT CARDIOVERSION N/A 07/22/2016   Procedure: TRANSESOPHAGEAL ECHOCARDIOGRAM (TEE);  Surgeon: Maude Fleeta Ochoa, MD;  Location: [facility] OR;  Service: Open Heart Surgery;  Laterality: N/A;   TONSILLECTOMY      Social History   Socioeconomic History   Marital status: Single    Spouse name: Not on file   Number of children: 0   Years of education: Not on file   Highest education level: Bachelor's degree (e.g., BA, AB, BS)  Occupational History   Occupation: RETIRED/chef    Employer: [employer]  Tobacco Use   Smoking status: Former    Current packs/day: 0.00    Types: Cigarettes    Quit date: 1995    Years since quitting: 31.0    Passive exposure: Never   Smokeless tobacco: Never   Tobacco comments:    Quit 20 yrs ago  Vaping Use   Vaping status: Never Used  Substance and Sexual Activity   Alcohol use: No    Alcohol/week: 0.0 standard drinks of alcohol   Drug use: No   Sexual activity: Yes    Birth  control/protection: Post-menopausal, Other-see comments    Comment: Monogamous   Other Topics Concern   Not on file  Social History Narrative   Single. Exercise: No. Education: College.      Lives alone   Social Drivers of Health   Tobacco Use: Medium Risk (05/21/2024)   Patient History    Smoking Tobacco Use: Former    Smokeless Tobacco Use: Never    Passive Exposure: Never  Physicist, Medical Strain: Medium Risk (10/22/2024)   Overall Financial Resource Strain (CARDIA)    Difficulty of Paying Living Expenses: Somewhat hard  Food Insecurity: Food Insecurity Present (10/22/2024)   Epic    Worried About Programme Researcher, Broadcasting/film/video in the Last Year: Sometimes true    Ran Out of Food in the Last Year: Sometimes true  Transportation Needs: No Transportation Needs (10/22/2024)   Epic    Lack of Transportation (Medical): No    Lack of Transportation (Non-Medical): No  Physical Activity: Insufficiently Active (10/22/2024)   Exercise Vital Sign    Days of Exercise per Week: 4 days    Minutes of Exercise per Session: 30 min  Stress: Stress Concern Present (10/22/2024)   [employer] of Occupational Health - Occupational Stress Questionnaire    Feeling of Stress: To some extent  Social Connections: Moderately Isolated (10/22/2024)   Social Connection and Isolation Panel    Frequency of Communication with Friends and Family: Twice a week    Frequency of Social Gatherings with Friends and Family: Once a week    Attends Religious Services: 1 to 4 times per year    Active Member of [employer] or Organizations: No    Attends Engineer, Structural: Not on file    Marital Status: Never married  Intimate Partner Violence: Patient Unable To Answer (01/11/2024)   Humiliation, Afraid, Rape, and Kick questionnaire    Fear of Current or Ex-Partner: Patient unable to answer    Emotionally Abused: Patient unable to answer    Physically Abused: Patient unable to answer    Sexually Abused: Patient unable  to answer  Depression (PHQ2-9): Low Risk (04/18/2024)   Depression (PHQ2-9)    PHQ-2 Score: 0  Alcohol Screen: Low  Risk (01/11/2024)   Alcohol Screen    Last Alcohol Screening Score (AUDIT): 0  Housing: High Risk (10/22/2024)   Epic    Unable to Pay for Housing in the Last Year: Yes    Number of Times Moved in the Last Year: Not on file    Homeless in the Last Year: No  Utilities: Not At Risk (01/11/2024)   AHC Utilities    Threatened with loss of utilities: No  Health Literacy: Adequate Health Literacy (01/11/2024)   B1300 Health Literacy    Frequency of need for help with medical instructions: Never    Family History  Problem Relation Age of Onset   Heart disease Mother    Cancer Mother 41       lung   Breast cancer Mother 42   Heart disease Father    Heart attack Sister    Diabetes Sister        type 2   Atrial fibrillation Sister    Sleep apnea Sister    Heart attack Brother    Heart attack Brother    Cancer Maternal Grandmother        breast   Breast cancer Maternal Grandmother    Leukemia Paternal Grandmother        neoplast   Aneurysm Paternal Grandfather        abdominal (stomach)   Colon cancer Neg Hx      Review of Systems  Constitutional: Negative.  Negative for chills and fever.  HENT: Negative.  Negative for congestion and sore throat.   Respiratory: Negative.  Negative for cough and shortness of breath.   Cardiovascular: Negative.  Negative for chest pain and palpitations.  Gastrointestinal:  Negative for abdominal pain, diarrhea, nausea and vomiting.  Genitourinary: Negative.  Negative for dysuria and hematuria.  Skin: Negative.  Negative for rash.  Neurological: Negative.  Negative for dizziness and headaches.  All other systems reviewed and are negative.   Today's Vitals   10/22/24 0933  BP: 138/82  Pulse: 76  Temp: 98 F (36.7 C)  TempSrc: Temporal  SpO2: 98%  Weight: 202 lb 2 oz (91.7 kg)  Height: 5' 2 (1.575 m)   Body mass index is 36.97  kg/m.   Physical Exam Vitals reviewed.  Constitutional:      Appearance: Normal appearance. She is obese.  HENT:     Head: Normocephalic.     Mouth/Throat:     Mouth: Mucous membranes are moist.     Pharynx: Oropharynx is clear.  Eyes:     Extraocular Movements: Extraocular movements intact.     Conjunctiva/sclera: Conjunctivae normal.     Pupils: Pupils are equal, round, and reactive to light.  Cardiovascular:     Rate and Rhythm: Normal rate and regular rhythm.     Pulses: Normal pulses.     Heart sounds: Normal heart sounds.  Pulmonary:     Effort: Pulmonary effort is normal.     Breath sounds: Normal breath sounds.  Abdominal:     Palpations: Abdomen is soft.     Tenderness: There is no abdominal tenderness.  Musculoskeletal:     Cervical back: No tenderness.  Lymphadenopathy:     Cervical: No cervical adenopathy.  Skin:    General: Skin is warm and dry.     Capillary Refill: Capillary refill takes less than 2 seconds.  Neurological:     General: No focal deficit present.     Mental Status: She is alert and oriented to person,  place, and time.  Psychiatric:        Mood and Affect: Mood normal.        Behavior: Behavior normal.    Results for orders placed or performed in visit on 10/22/24 (from the past 24 hours)  POCT HgB A1C     Status: None   Collection Time: 10/22/24  9:36 AM  Result Value Ref Range   Hemoglobin A1C     HbA1c POC (<> result, manual entry) 6.9 4.0 - 5.6 %   HbA1c, POC (prediabetic range)     HbA1c, POC (controlled diabetic range)       ASSESSMENT & PLAN: A total of 44 minutes was spent with the patient and counseling/coordination of care regarding preparing for this visit, review of most recent office visit notes, review of multiple chronic medical conditions and their management, cardiovascular risks associated with diabetes and hypertension, review of all medications, review of most recent bloodwork results including interpretation of  today's hemoglobin A1c, review of health maintenance items, education on nutrition, prognosis, documentation, and need for follow up.   Problem List Items Addressed This Visit       Cardiovascular and Mediastinum   Coronary artery disease involving native coronary artery of native heart with angina pectoris   Stable.  No recent use of nitroglycerin  Continues metoprolol  succinate 50 mg daily. Continues Plavix  75 mg daily      Relevant Orders   AMB Referral VBCI Care Management   Chronic heart failure with preserved ejection fraction (HFpEF) (HCC)   No clinical findings of acute congestive heart failure Continues furosemide  20 mg daily along with metoprolol  succinate 50 mg daily and lisinopril  40 mg daily      Relevant Orders   AMB Referral VBCI Care Management   Hypertension associated with diabetes (HCC)   Well-controlled hypertension Continue metoprolol  succinate 50 mg daily and lisinopril  40 mg daily Well-controlled diabetes with hemoglobin A1c of 6.9 Not taking Trulicity  0.75 mg due to cost Cardiovascular risks associated with hypertension and diabetes discussed Diet and nutrition discussed      Relevant Orders   AMB Referral VBCI Care Management     Respiratory   OSA (obstructive sleep apnea) (Chronic)   Stable. On CPAP treatment.       Relevant Orders   AMB Referral VBCI Care Management     Digestive   GERD (gastroesophageal reflux disease)   Stable. Asymptomatic.       Relevant Orders   AMB Referral VBCI Care Management     Endocrine   Hyperlipidemia associated with type 2 diabetes mellitus (HCC) - Primary   Chronic stable condition Continue atorvastatin  40 mg daily Hemoglobin A1c today is 6.9 Last cardiologist office assessment and plan regarding hyperlipidemia: Hyperlipidemia/statin myopathy LDL 39 January 2025, at goal. Patient is having difficulty affording Praluent  with the new medicare changes. She continues to take atorvastatin  but it causes  generalized malaise and myopathy.  -Stop Praluent .  -Add Zetia  10 mg daily.  -Decrease atorvastatin  to 40 mg. -Repeat lipid panel and LFTs in 2 months.       Relevant Orders   POCT HgB A1C (Completed)   Microalbumin / creatinine urine ratio (Completed)   Comprehensive metabolic panel with GFR (Completed)   CBC with Differential/Platelet (Completed)   Lipid panel (Completed)   AMB Referral VBCI Care Management     Other   Morbid obesity (HCC)   Wt Readings from Last 3 Encounters:  10/22/24 202 lb 2 oz (91.7 kg)  04/18/24  193 lb (87.5 kg)  01/23/24 186 lb 3.2 oz (84.5 kg)  Associated with hypertension, diabetes, and dyslipidemia. Diet and nutrition discussed Advised to decrease amount of daily carbohydrate intake and daily calories and increase amount of plant-based protein in her diet Not taking Trulicity  at present time       Relevant Orders   AMB Referral VBCI Care Management   History of gout   No recent flareup Continues daily allopurinol  100 mg      Relevant Orders   Uric acid   AMB Referral VBCI Care Management     Patient Instructions  Health Maintenance After Age 13 After age 11, you are at a higher risk for certain long-term diseases and infections as well as injuries from falls. Falls are a major cause of broken bones and head injuries in people who are older than age 19. Getting regular preventive care can help to keep you healthy and well. Preventive care includes getting regular testing and making lifestyle changes as recommended by your health care provider. Talk with your health care provider about: Which screenings and tests you should have. A screening is a test that checks for a disease when you have no symptoms. A diet and exercise plan that is right for you. What should I know about screenings and tests to prevent falls? Screening and testing are the best ways to find a health problem early. Early diagnosis and treatment give you the best chance of  managing medical conditions that are common after age 68. Certain conditions and lifestyle choices may make you more likely to have a fall. Your health care provider may recommend: Regular vision checks. Poor vision and conditions such as cataracts can make you more likely to have a fall. If you wear glasses, make sure to get your prescription updated if your vision changes. Medicine review. Work with your health care provider to regularly review all of the medicines you are taking, including over-the-counter medicines. Ask your health care provider about any side effects that may make you more likely to have a fall. Tell your health care provider if any medicines that you take make you feel dizzy or sleepy. Strength and balance checks. Your health care provider may recommend certain tests to check your strength and balance while standing, walking, or changing positions. Foot health exam. Foot pain and numbness, as well as not wearing proper footwear, can make you more likely to have a fall. Screenings, including: Osteoporosis screening. Osteoporosis is a condition that causes the bones to get weaker and break more easily. Blood pressure screening. Blood pressure changes and medicines to control blood pressure can make you feel dizzy. Depression screening. You may be more likely to have a fall if you have a fear of falling, feel depressed, or feel unable to do activities that you used to do. Alcohol use screening. Using too much alcohol can affect your balance and may make you more likely to have a fall. Follow these instructions at home: Lifestyle Do not drink alcohol if: Your health care provider tells you not to drink. If you drink alcohol: Limit how much you have to: 0-1 drink a day for women. 0-2 drinks a day for men. Know how much alcohol is in your drink. In the [place] one drink equals one 12 oz bottle of beer (355 mL), one 5 oz glass of wine (148 mL), or one 1 oz glass of hard liquor (44  mL). Do not use any products that contain nicotine or tobacco.  These products include cigarettes, chewing tobacco, and vaping devices, such as e-cigarettes. If you need help quitting, ask your health care provider. Activity  Follow a regular exercise program to stay fit. This will help you maintain your balance. Ask your health care provider what types of exercise are appropriate for you. If you need a cane or walker, use it as recommended by your health care provider. Wear supportive shoes that have nonskid soles. Safety  Remove any tripping hazards, such as rugs, cords, and clutter. Install safety equipment such as grab bars in bathrooms and safety rails on stairs. Keep rooms and walkways well-lit. General instructions Talk with your health care provider about your risks for falling. Tell your health care provider if: You fall. Be sure to tell your health care provider about all falls, even ones that seem minor. You feel dizzy, tiredness (fatigue), or off-balance. Take over-the-counter and prescription medicines only as told by your health care provider. These include supplements. Eat a healthy diet and maintain a healthy weight. A healthy diet includes low-fat dairy products, low-fat (lean) meats, and fiber from whole grains, beans, and lots of fruits and vegetables. Stay current with your vaccines. Schedule regular health, dental, and eye exams. Summary Having a healthy lifestyle and getting preventive care can help to protect your health and wellness after age 91. Screening and testing are the best way to find a health problem early and help you avoid having a fall. Early diagnosis and treatment give you the best chance for managing medical conditions that are more common for people who are older than age 77. Falls are a major cause of broken bones and head injuries in people who are older than age 34. Take precautions to prevent a fall at home. Work with your health care provider to  learn what changes you can make to improve your health and wellness and to prevent falls. This information is not intended to replace advice given to you by your health care provider. Make sure you discuss any questions you have with your health care provider. Document Revised: 02/16/2021 Document Reviewed: 02/16/2021 Elsevier Patient Education  2024 Elsevier Inc.    Emil Schaumann, MD [facility]     [1]  Allergies Allergen Reactions   Aspirin  Other (See Comments)    Sores in mouth Sores in mouth   Adhesive [Tape] Other (See Comments)    SKIN BLISTERS   Penicillins     Swelling    Red Yeast Rice Hives   Sulfa Drugs Cross Reactors Hives

## 2024-10-22 NOTE — Assessment & Plan Note (Signed)
No clinical findings of acute congestive heart failure Continues furosemide 20 mg daily along with metoprolol succinate 50 mg daily and lisinopril 40 mg daily

## 2024-10-22 NOTE — Assessment & Plan Note (Signed)
 Wt Readings from Last 3 Encounters:  10/22/24 202 lb 2 oz (91.7 kg)  04/18/24 193 lb (87.5 kg)  01/23/24 186 lb 3.2 oz (84.5 kg)  Diet and nutrition discussed Advised to decrease amount of daily carbohydrate intake and daily calories and increase amount of plant-based protein in her diet Not taking Trulicity  at present time

## 2024-10-22 NOTE — Assessment & Plan Note (Signed)
 No recent flareup Continues daily allopurinol 100 mg

## 2024-10-22 NOTE — Assessment & Plan Note (Signed)
Stable.  On CPAP treatment. 

## 2024-10-22 NOTE — Assessment & Plan Note (Signed)
Stable.  No recent use of nitroglycerin Continues metoprolol succinate 50 mg daily. Continues Plavix 75 mg daily

## 2024-10-22 NOTE — Assessment & Plan Note (Signed)
 Well-controlled hypertension Continue metoprolol  succinate 50 mg daily and lisinopril  40 mg daily Well-controlled diabetes with hemoglobin A1c of 6.9 Not taking Trulicity  0.75 mg due to cost Cardiovascular risks associated with hypertension and diabetes discussed Diet and nutrition discussed

## 2024-10-22 NOTE — Assessment & Plan Note (Signed)
"  Stable   Asymptomatic   "

## 2024-10-23 ENCOUNTER — Telehealth: Payer: Self-pay | Admitting: *Deleted

## 2024-10-23 NOTE — Telephone Encounter (Signed)
 Yes

## 2024-10-23 NOTE — Progress Notes (Unsigned)
 Care Guide Pharmacy Note  10/23/2024 Name: [name] MRN: [id number] DOB: [birth year]  Referred By: Purcell Emil Schanz, MD Reason for referral: Call Attempt #1 and Complex Care Management (Outreach to schedule referral with pharmacist )   [name] [name] is a 69 y.o. year old female who is a primary care patient of Sagardia, Emil Schanz, MD.  Towanna Avery was referred to the pharmacist for assistance related to: DMII  An unsuccessful telephone outreach was attempted today to contact the patient who was referred to the pharmacy team for assistance with medication management. Additional attempts will be made to contact the patient.  Thedford Franks, CMA, AAMA [place]  [facility], [facility] Guide, Lead Direct Dial: [phone]  Fax: [phone]

## 2024-10-24 NOTE — Progress Notes (Signed)
 Care Guide Pharmacy Note  10/24/2024 Name: [name] [name] MRN: [id number] DOB: January 10, 1956  Referred By: Purcell Emil Schanz, MD Reason for referral: Call Attempt #1 and Complex Care Management (Outreach to schedule referral with pharmacist )   [name] [name] is a 69 y.o. year old female who is a primary care patient of Sagardia, Emil Schanz, MD.  [name] [name] was referred to the pharmacist for assistance related to: DMII  A second unsuccessful telephone outreach was attempted today to contact the patient who was referred to the pharmacy team for assistance with medication management. Additional attempts will be made to contact the patient.  Thedford Franks, CMA, [facility]  [facility], [facility] Guide, Lead Direct Dial: [phone]  Fax: [phone]

## 2024-10-25 NOTE — Progress Notes (Signed)
 Care Guide Pharmacy Note  10/25/2024 Name: [name] [name] MRN: [id number] DOB: 08-Nov-1955  Referred By: Purcell Emil Schanz, MD Reason for referral: Call Attempt #1 and Complex Care Management (Outreach to schedule referral with pharmacist )   [name] is a 69 y.o. year old female who is a primary care patient of Sagardia, Emil Schanz, MD.  Sanam Marmo was referred to the pharmacist for assistance related to: DMII  A third unsuccessful telephone outreach was attempted today to contact the patient who was referred to the pharmacy team for assistance with medication management. The Population Health team is pleased to engage with this patient at any time in the future upon receipt of referral and should he/she be interested in assistance from the Population Health team.  Thedford Franks, CMA, [facility]  [facility], [facility] Guide, Lead Direct Dial: [phone]  Fax: [phone]

## 2024-11-09 ENCOUNTER — Telehealth: Payer: Self-pay | Admitting: Internal Medicine

## 2024-11-09 NOTE — Telephone Encounter (Signed)
 Patient contacted as part of preoperative protocol.  She remains stable from a cardiac standpoint.  Reviewed recommendations for Plavix  hold.  She expressed understanding.  Today she denies chest pain, shortness of breath, lower extremity edema, fatigue, palpitations, melena, hematuria, hemoptysis, diaphoresis, weakness, presyncope, syncope, orthopnea, and PND.  [name] [name]. [name] Sonoda NP-C     11/09/2024, 4:23 PM [facility] [address] [place], [place] [address] Office [phone]

## 2024-11-09 NOTE — Telephone Encounter (Signed)
"  ° °  Pre-operative Risk Assessment    Patient Name: [name] [name]  DOB: [birth year] MRN: [id number]   Date of last office visit: unknown Date of next office visit: unknown   Request for Surgical Clearance    Procedure:  colonoscopy  Date of Surgery:  Clearance 11/14/24                                Surgeon:  Dr. Lindaann Surgeon's Group or Practice Name:  [facility] Phone number:  [phone] Fax number:  [phone]   Type of Clearance Requested:   - Pharmacy:  Hold Clopidogrel  (Plavix ) Hold for 4 days   Type of Anesthesia:  Not Indicated   Additional requests/questions:    Bonney Berwyn LELON Jackson   11/09/2024, 2:01 PM   "

## 2024-11-09 NOTE — Telephone Encounter (Signed)
 Dr. Mady,  [name] [name] is requesting preoperative cardiac evaluation for colonoscopy.  Her procedure is planned for 11/14/2024.  She was last seen in clinic on 01/23/2024.  She remained stable from a cardiac standpoint at that time.  Her PMH includes coronary artery disease status post CABG x 310/17.  She underwent nuclear stress testing 9/22 which showed low risk.  Echocardiogram 10/17 showed normal LVEF/RV function, G1 DD and mild LAE.  She has sinus node dysfunction and underwent PPM 10/09.  Her PMH also includes hyperlipidemia, and hypertension.  May her Plavix  be held prior to her procedure?  Please direct her response to CVD IV preop hold.  Thank you for your help.  [name] Faisal Stradling NP-C     11/09/2024, 11:55 AM [facility] [address] [place], [place] [address] Office [phone]

## 2024-11-09 NOTE — Telephone Encounter (Signed)
 Duplicate request.  [name] Criss Pallone NP-C     11/09/2024, 2:22 PM [facility] [address] [place], [place] [address] Office [phone]

## 2024-11-09 NOTE — Telephone Encounter (Signed)
 If the patient has not had any symptoms since her last visit in 01/2024, I think it is reasonable to proceed with planned colonoscopy, which is low risk from a cardiac standpoint.  Clopidogrel  can be held for 5 days prior to the procedure and resumed when safe to do so.  I will have our preop team reach out to the patient by phone and document that no new cardiac symptoms have developed.  Lonni Hanson, MD [facility]

## 2024-11-09 NOTE — Telephone Encounter (Signed)
 Error

## 2024-11-09 NOTE — Telephone Encounter (Signed)
" °  ° °  Primary Cardiologist: Lonni Hanson, MD  Chart reviewed as part of pre-operative protocol coverage. Given past medical history and time since last visit, based on ACC/AHA guidelines, [name] [name] would be at acceptable risk for the planned procedure without further cardiovascular testing.   Her Plavix  may be held for 4 days prior to her procedure.  Please resume as soon as hemostasis is achieved.  I will route this recommendation to the requesting party via Epic fax function and remove from pre-op pool.  Please call with questions.  [name] Kerrilynn Derenzo NP-C     11/09/2024, 4:24 PM [facility] [address] [place], [place] [address] Office [phone]      "

## 2024-12-10 ENCOUNTER — Encounter

## 2025-01-11 ENCOUNTER — Ambulatory Visit

## 2025-01-21 ENCOUNTER — Telehealth: Admitting: Adult Health

## 2025-02-01 ENCOUNTER — Ambulatory Visit: Admitting: Adult Health

## 2025-03-11 ENCOUNTER — Encounter

## 2025-04-22 ENCOUNTER — Ambulatory Visit: Admitting: Emergency Medicine

## 2025-06-10 ENCOUNTER — Encounter

## 2025-09-09 ENCOUNTER — Encounter

## 2025-12-09 ENCOUNTER — Encounter
